# Patient Record
Sex: Female | Born: 1950 | Race: White | Hispanic: No | Marital: Married | Smoking: Never smoker
Health system: Southern US, Community
[De-identification: ages and names within clinical notes are randomized; demographics above are authoritative.]

## PROBLEM LIST (undated history)

## (undated) DIAGNOSIS — Z973 Presence of spectacles and contact lenses: Secondary | ICD-10-CM

## (undated) DIAGNOSIS — E785 Hyperlipidemia, unspecified: Secondary | ICD-10-CM

## (undated) DIAGNOSIS — M1990 Unspecified osteoarthritis, unspecified site: Secondary | ICD-10-CM

## (undated) DIAGNOSIS — K5730 Diverticulosis of large intestine without perforation or abscess without bleeding: Secondary | ICD-10-CM

## (undated) DIAGNOSIS — Z8249 Family history of ischemic heart disease and other diseases of the circulatory system: Secondary | ICD-10-CM

## (undated) DIAGNOSIS — B009 Herpesviral infection, unspecified: Secondary | ICD-10-CM

## (undated) DIAGNOSIS — N201 Calculus of ureter: Secondary | ICD-10-CM

## (undated) DIAGNOSIS — N200 Calculus of kidney: Secondary | ICD-10-CM

## (undated) DIAGNOSIS — M8580 Other specified disorders of bone density and structure, unspecified site: Secondary | ICD-10-CM

## (undated) HISTORY — DX: Other specified disorders of bone density and structure, unspecified site: M85.80

## (undated) HISTORY — DX: Family history of ischemic heart disease and other diseases of the circulatory system: Z82.49

## (undated) HISTORY — DX: Herpesviral infection, unspecified: B00.9

## (undated) HISTORY — DX: Hyperlipidemia, unspecified: E78.5

## (undated) HISTORY — DX: Calculus of kidney: N20.0

## (undated) HISTORY — DX: Unspecified osteoarthritis, unspecified site: M19.90

---

## 1987-10-17 HISTORY — PX: OTHER SURGICAL HISTORY: SHX169

## 1993-10-16 HISTORY — PX: DILATION AND EVACUATION: SHX1459

## 1999-01-17 ENCOUNTER — Other Ambulatory Visit: Admission: RE | Admit: 1999-01-17 | Discharge: 1999-01-17 | Payer: Self-pay | Admitting: *Deleted

## 1999-07-17 HISTORY — PX: LAPAROSCOPIC OVARIAN CYSTECTOMY: SUR786

## 1999-08-05 ENCOUNTER — Encounter (INDEPENDENT_AMBULATORY_CARE_PROVIDER_SITE_OTHER): Payer: Self-pay | Admitting: Specialist

## 1999-08-05 ENCOUNTER — Other Ambulatory Visit: Admission: RE | Admit: 1999-08-05 | Discharge: 1999-08-05 | Payer: Self-pay | Admitting: *Deleted

## 2000-02-28 ENCOUNTER — Other Ambulatory Visit: Admission: RE | Admit: 2000-02-28 | Discharge: 2000-02-28 | Payer: Self-pay | Admitting: *Deleted

## 2001-04-23 ENCOUNTER — Other Ambulatory Visit: Admission: RE | Admit: 2001-04-23 | Discharge: 2001-04-23 | Payer: Self-pay | Admitting: *Deleted

## 2002-04-23 ENCOUNTER — Other Ambulatory Visit: Admission: RE | Admit: 2002-04-23 | Discharge: 2002-04-23 | Payer: Self-pay | Admitting: *Deleted

## 2003-05-04 ENCOUNTER — Other Ambulatory Visit: Admission: RE | Admit: 2003-05-04 | Discharge: 2003-05-04 | Payer: Self-pay | Admitting: *Deleted

## 2004-05-05 ENCOUNTER — Other Ambulatory Visit: Admission: RE | Admit: 2004-05-05 | Discharge: 2004-05-05 | Payer: Self-pay | Admitting: *Deleted

## 2005-05-10 ENCOUNTER — Other Ambulatory Visit: Admission: RE | Admit: 2005-05-10 | Discharge: 2005-05-10 | Payer: Self-pay | Admitting: *Deleted

## 2006-05-14 ENCOUNTER — Other Ambulatory Visit: Admission: RE | Admit: 2006-05-14 | Discharge: 2006-05-14 | Payer: Self-pay | Admitting: *Deleted

## 2007-01-27 ENCOUNTER — Emergency Department (HOSPITAL_COMMUNITY): Admission: EM | Admit: 2007-01-27 | Discharge: 2007-01-27 | Payer: Self-pay | Admitting: Emergency Medicine

## 2007-03-05 ENCOUNTER — Encounter: Admission: RE | Admit: 2007-03-05 | Discharge: 2007-03-05 | Payer: Self-pay | Admitting: Orthopedic Surgery

## 2007-10-29 ENCOUNTER — Other Ambulatory Visit: Admission: RE | Admit: 2007-10-29 | Discharge: 2007-10-29 | Payer: Self-pay | Admitting: *Deleted

## 2008-10-16 HISTORY — PX: COLONOSCOPY: SHX174

## 2009-04-05 ENCOUNTER — Encounter: Admission: RE | Admit: 2009-04-05 | Discharge: 2009-04-05 | Payer: Self-pay | Admitting: Family Medicine

## 2009-12-15 HISTORY — PX: NM MYOCAR PERF WALL MOTION: HXRAD629

## 2009-12-15 HISTORY — PX: US ECHOCARDIOGRAPHY: HXRAD669

## 2011-09-16 HISTORY — PX: BREAST BIOPSY: SHX20

## 2011-10-05 ENCOUNTER — Other Ambulatory Visit: Payer: Self-pay | Admitting: Radiology

## 2013-01-13 ENCOUNTER — Other Ambulatory Visit: Payer: Self-pay | Admitting: Orthopedic Surgery

## 2013-01-13 DIAGNOSIS — M25551 Pain in right hip: Secondary | ICD-10-CM

## 2013-01-15 ENCOUNTER — Ambulatory Visit
Admission: RE | Admit: 2013-01-15 | Discharge: 2013-01-15 | Disposition: A | Discharge status: Home / Self Care | Payer: BC Managed Care – PPO | Source: Ambulatory Visit | Attending: Orthopedic Surgery | Admitting: Orthopedic Surgery

## 2013-01-15 DIAGNOSIS — M25551 Pain in right hip: Secondary | ICD-10-CM

## 2013-05-16 ENCOUNTER — Telehealth: Payer: Self-pay | Admitting: Obstetrics & Gynecology

## 2013-05-16 MED ORDER — VALACYCLOVIR HCL 1 G PO TABS: 1000.0000 mg | ORAL_TABLET | Freq: Every day | ORAL | Status: AC

## 2013-05-16 NOTE — Telephone Encounter (Signed)
Patient needs her Valtrex refilled.   [facility]  [place]  [phone]

## 2013-05-16 NOTE — Telephone Encounter (Signed)
Refill request for VALTREX Last filled by MD on - 06/28/12, #30 X 3 Last AEX - 12/19/12 Next AEX - 02/2014 RX sent as last prescribed.

## 2013-07-16 ENCOUNTER — Telehealth: Payer: Self-pay | Admitting: Cardiovascular Disease

## 2013-07-16 NOTE — Telephone Encounter (Signed)
Please mail out a lab order before her appt on 08/04/2013..Thanks

## 2013-07-17 ENCOUNTER — Other Ambulatory Visit: Payer: Self-pay | Admitting: *Deleted

## 2013-07-17 DIAGNOSIS — Z79899 Other long term (current) drug therapy: Secondary | ICD-10-CM

## 2013-07-17 DIAGNOSIS — E782 Mixed hyperlipidemia: Secondary | ICD-10-CM

## 2013-07-17 NOTE — Telephone Encounter (Signed)
Lab requisitions printed and mailed to patient

## 2013-07-28 LAB — LIPID PANEL
HDL: 80 mg/dL (ref >39)
LDL Cholesterol: 108 mg/dL — ABNORMAL HIGH (ref 0–99)
Triglycerides: 96 mg/dL (ref <150)
VLDL: 19 mg/dL (ref 0–40)

## 2013-07-28 LAB — HEPATIC FUNCTION PANEL
ALT: 15 U/L (ref 0–35)
Bilirubin, Direct: 0.2 mg/dL (ref 0.0–0.3)
Indirect Bilirubin: 0.8 mg/dL (ref 0.0–0.9)

## 2013-08-04 ENCOUNTER — Encounter: Payer: Self-pay | Admitting: Cardiovascular Disease

## 2013-08-04 ENCOUNTER — Ambulatory Visit (INDEPENDENT_AMBULATORY_CARE_PROVIDER_SITE_OTHER): Payer: BC Managed Care – PPO | Admitting: Cardiovascular Disease

## 2013-08-04 VITALS — BP 128/84 | HR 71 | Ht 61.5 in | Wt 150.0 lb

## 2013-08-04 DIAGNOSIS — Z8249 Family history of ischemic heart disease and other diseases of the circulatory system: Secondary | ICD-10-CM

## 2013-08-04 DIAGNOSIS — E785 Hyperlipidemia, unspecified: Secondary | ICD-10-CM | POA: Insufficient documentation

## 2013-08-04 NOTE — Assessment & Plan Note (Signed)
Diet controlled with recent lipid profile performed 07/28/13 revealing a total cholesterol 207, LDL of 108 and HDL of 80.

## 2013-08-04 NOTE — Progress Notes (Signed)
     08/04/2013 Brooke Whitaker   1951/06/04  [id number]  Primary Physician Astrid Divine, MD Primary Cardiologist: Runell Gess MD Roseanne Reno   HPI:  The patient returns today for followup. She is a 62 year old, mildly overweight, married Caucasian female, mother of 2, patient of Dr. Maurice Small who I saw Feb 14, 2011. Her risk factors include dyslipidemia and a strong family history of heart disease. She had a father who had his first MI at age 21 and died of an MI at age 76. She had a sister who died suddenly of an MI at age 68. She is totally asymptomatic. She had an echo and a Myoview performed March 2011 which were entirely normal. Recent lipid profile performed several weeks ago revealed a total cholesterol of 207, LDL of 108 and HDL of 80. She denies chest pain or shortness of breath..     Current Outpatient Prescriptions  Medication Sig Dispense Refill  . CELEBREX 200 MG capsule Take 200 mg by mouth as needed.      . cholecalciferol (VITAMIN D) 1000 UNITS tablet Take 1,000 Units by mouth daily.      . fluticasone (FLONASE) 50 MCG/ACT nasal spray as needed.      . Multiple Vitamin (MULTIVITAMIN) capsule Take 1 capsule by mouth daily.      . multivitamin-lutein (OCUVITE-LUTEIN) CAPS capsule Take 1 capsule by mouth daily.       No current facility-administered medications for this visit.    Allergies  Allergen Reactions  . Sulfa Antibiotics     History   Social History  . Marital Status: Married    Spouse Name: N/A    Number of Children: N/A  . Years of Education: N/A   Occupational History  . Not on file.   Social History Main Topics  . Smoking status: Never Smoker   . Smokeless tobacco: Not on file  . Alcohol Use: Yes  . Drug Use: Not on file  . Sexual Activity: Not on file   Other Topics Concern  . Not on file   Social History Narrative  . No narrative on file     Review of Systems: General: negative for chills, fever, night  sweats or weight changes.  Cardiovascular: negative for chest pain, dyspnea on exertion, edema, orthopnea, palpitations, paroxysmal nocturnal dyspnea or shortness of breath Dermatological: negative for rash Respiratory: negative for cough or wheezing Urologic: negative for hematuria Abdominal: negative for nausea, vomiting, diarrhea, bright red blood per rectum, melena, or hematemesis Neurologic: negative for visual changes, syncope, or dizziness All other systems reviewed and are otherwise negative except as noted above.    Blood pressure 128/84, pulse 71, height 5' 1.5" (1.562 m), weight 150 lb (68.04 kg).  General appearance: alert and no distress Neck: no adenopathy, no carotid bruit, no JVD, supple, symmetrical, trachea midline and thyroid not enlarged, symmetric, no tenderness/mass/nodules Lungs: clear to auscultation bilaterally Heart: regular rate and rhythm, S1, S2 normal, no murmur, click, rub or gallop Extremities: extremities normal, atraumatic, no cyanosis or edema  EKG sinus rhythm at 71 without ST or T wave changes.  ASSESSMENT AND PLAN:   Hyperlipidemia Diet controlled with recent lipid profile performed 07/28/13 revealing a total cholesterol 207, LDL of 108 and HDL of 80.      Runell Gess MD FACP,FACC,FAHA, [facility] 08/04/2013 11:34 AM

## 2013-08-04 NOTE — Patient Instructions (Signed)
Your physician wants you to follow-up in: 1 year with Dr Berry. You will receive a reminder letter in the mail two months in advance. If you don't receive a letter, please call our office to schedule the follow-up appointment.  

## 2013-12-04 ENCOUNTER — Encounter: Payer: Self-pay | Admitting: Nurse Practitioner

## 2013-12-04 ENCOUNTER — Ambulatory Visit (INDEPENDENT_AMBULATORY_CARE_PROVIDER_SITE_OTHER): Payer: BC Managed Care – PPO | Admitting: Nurse Practitioner

## 2013-12-04 VITALS — BP 134/72 | HR 84 | Ht 61.5 in | Wt 150.0 lb

## 2013-12-04 DIAGNOSIS — B373 Candidiasis of vulva and vagina: Secondary | ICD-10-CM

## 2013-12-04 DIAGNOSIS — N952 Postmenopausal atrophic vaginitis: Secondary | ICD-10-CM

## 2013-12-04 DIAGNOSIS — B3731 Acute candidiasis of vulva and vagina: Secondary | ICD-10-CM

## 2013-12-04 MED ORDER — TERCONAZOLE 0.4 % VA CREA
1.0000 | TOPICAL_CREAM | Freq: Every day | VAGINAL | Status: DC
Start: 2013-12-04 — End: 2014-01-13

## 2013-12-04 NOTE — Patient Instructions (Signed)
Monilial Vaginitis  Vaginitis in a soreness, swelling and redness (inflammation) of the vagina and vulva. Monilial vaginitis is not a sexually transmitted infection.  CAUSES   Yeast vaginitis is caused by yeast (candida) that is normally found in your vagina. With a yeast infection, the candida has overgrown in number to a point that upsets the chemical balance.  SYMPTOMS   · White, thick vaginal discharge.  · Swelling, itching, redness and irritation of the vagina and possibly the lips of the vagina (vulva).  · Burning or painful urination.  · Painful intercourse.  DIAGNOSIS   Things that may contribute to monilial vaginitis are:  · Postmenopausal and virginal states.  · Pregnancy.  · Infections.  · Being tired, sick or stressed, especially if you had monilial vaginitis in the past.  · Diabetes. Good control will help lower the chance.  · Birth control pills.  · Tight fitting garments.  · Using bubble bath, feminine sprays, douches or deodorant tampons.  · Taking certain medications that kill germs (antibiotics).  · Sporadic recurrence can occur if you become ill.  TREATMENT   Your caregiver will give you medication.  · There are several kinds of anti monilial vaginal creams and suppositories specific for monilial vaginitis. For recurrent yeast infections, use a suppository or cream in the vagina 2 times a week, or as directed.  · Anti-monilial or steroid cream for the itching or irritation of the vulva may also be used. Get your caregiver's permission.  · Painting the vagina with methylene blue solution may help if the monilial cream does not work.  · Eating yogurt may help prevent monilial vaginitis.  HOME CARE INSTRUCTIONS   · Finish all medication as prescribed.  · Do not have sex until treatment is completed or after your caregiver tells you it is okay.  · Take warm sitz baths.  · Do not douche.  · Do not use tampons, especially scented ones.  · Wear cotton underwear.  · Avoid tight pants and panty  hose.  · Tell your sexual partner that you have a yeast infection. They should go to their caregiver if they have symptoms such as mild rash or itching.  · Your sexual partner should be treated as well if your infection is difficult to eliminate.  · Practice safer sex. Use condoms.  · Some vaginal medications cause latex condoms to fail. Vaginal medications that harm condoms are:  · Cleocin cream.  · Butoconazole (Femstat®).  · Terconazole (Terazol®) vaginal suppository.  · Miconazole (Monistat®) (may be purchased over the counter).  SEEK MEDICAL CARE IF:   · You have a temperature by mouth above 102° F (38.9° C).  · The infection is getting worse after 2 days of treatment.  · The infection is not getting better after 3 days of treatment.  · You develop blisters in or around your vagina.  · You develop vaginal bleeding, and it is not your menstrual period.  · You have pain when you urinate.  · You develop intestinal problems.  · You have pain with sexual intercourse.  Document Released: 07/12/2005 Document Revised: 12/25/2011 Document Reviewed: 03/26/2009  ExitCare® Patient Information ©2014 [employer].

## 2013-12-04 NOTE — Progress Notes (Signed)
Subjective:     Patient ID: Brooke Whitaker, female   DOB: 10/25/1950, 63 y.o.   MRN: [id number]  HPI   This 63 yo female who is postmenopausal and has a history of atrophic vaginitis.  She has been off vaginal estrogen for some time then recently restarted.  On Sauturday they tried to have SA and found that it was very uncomfortable for her and after some time stopped.  Within a few days she felt irritated and burning feeling with itching.  Not a lot of vaginal dischsrge. She denies any urinary symptoms. No fever or chills.   Review of Systems  Constitutional: Negative for fever, chills and fatigue.  Respiratory: Negative.   Cardiovascular: Negative.   Gastrointestinal: Negative.   Genitourinary: Positive for vaginal pain and dyspareunia. Negative for dysuria, urgency, frequency, flank pain, vaginal bleeding, vaginal discharge, genital sores and pelvic pain.  Musculoskeletal: Negative.   Skin: Negative.   Neurological: Negative.   Psychiatric/Behavioral: Negative.        Objective:   Physical Exam  Constitutional: She appears well-developed and well-nourished. No distress.  Abdominal: Soft. She exhibits no distension. There is no tenderness. There is no rebound.  Genitourinary:  External vulvar areas are irritated and tender.  One area looks like LSA and that was discussed by Dr. Sabra Heck in the past.  Only slight thin vaginal discharge.  With insertion of the speculum this was uncomfortable for her. Wet Prep: PH: 5.5; NSS: negative; KOH: moderate yeast.       Assessment:     Yeast vaginitis Atrophic vaginitis    Plan:     Will treat with Terazol vaginal cream hs x 7 She is due her AEX and will get that scheduled and recheck at that time.    Old records reviewed: LSA 11/07/07 biopsy

## 2013-12-07 NOTE — Progress Notes (Signed)
Encounter reviewed by Dr. Kaity Pitstick Silva.  

## 2014-01-13 ENCOUNTER — Encounter: Payer: Self-pay | Admitting: Obstetrics & Gynecology

## 2014-01-13 ENCOUNTER — Ambulatory Visit (INDEPENDENT_AMBULATORY_CARE_PROVIDER_SITE_OTHER): Payer: BC Managed Care – PPO | Admitting: Obstetrics & Gynecology

## 2014-01-13 VITALS — BP 128/62 | HR 64 | Resp 16 | Ht 61.75 in | Wt 149.6 lb

## 2014-01-13 DIAGNOSIS — Z124 Encounter for screening for malignant neoplasm of cervix: Secondary | ICD-10-CM

## 2014-01-13 DIAGNOSIS — Z0000 Encounter for general adult medical examination without abnormal findings: Secondary | ICD-10-CM

## 2014-01-13 DIAGNOSIS — Z01419 Encounter for gynecological examination (general) (routine) without abnormal findings: Secondary | ICD-10-CM

## 2014-01-13 DIAGNOSIS — Z1211 Encounter for screening for malignant neoplasm of colon: Secondary | ICD-10-CM

## 2014-01-13 LAB — LIPID PANEL
Cholesterol: 192 mg/dL (ref 0–200)
HDL: 87 mg/dL (ref >39)
LDL CALC: 89 mg/dL (ref 0–99)
Total CHOL/HDL Ratio: 2.2 Ratio
Triglycerides: 79 mg/dL (ref <150)
VLDL: 16 mg/dL (ref 0–40)

## 2014-01-13 LAB — POCT URINALYSIS DIPSTICK
BILIRUBIN UA: NEGATIVE
GLUCOSE UA: NEGATIVE
KETONES UA: NEGATIVE
Nitrite, UA: NEGATIVE
Urobilinogen, UA: NEGATIVE
pH, UA: 5

## 2014-01-13 LAB — COMPREHENSIVE METABOLIC PANEL
ALBUMIN: 4.4 g/dL (ref 3.5–5.2)
ALT: 17 U/L (ref 0–35)
AST: 19 U/L (ref 0–37)
Alkaline Phosphatase: 91 U/L (ref 39–117)
BUN: 16 mg/dL (ref 6–23)
CALCIUM: 8.8 mg/dL (ref 8.4–10.5)
CHLORIDE: 105 meq/L (ref 96–112)
CO2: 31 meq/L (ref 19–32)
CREATININE: 0.67 mg/dL (ref 0.50–1.10)
Glucose, Bld: 67 mg/dL — ABNORMAL LOW (ref 70–99)
POTASSIUM: 3.6 meq/L (ref 3.5–5.3)
SODIUM: 144 meq/L (ref 135–145)
TOTAL PROTEIN: 6.1 g/dL (ref 6.0–8.3)
Total Bilirubin: 1 mg/dL (ref 0.2–1.2)

## 2014-01-13 LAB — HEMOGLOBIN, FINGERSTICK: HEMOGLOBIN, FINGERSTICK: 12.3 g/dL (ref 12.0–16.0)

## 2014-01-13 LAB — VITAMIN D 25 HYDROXY (VIT D DEFICIENCY, FRACTURES): VIT D 25 HYDROXY: 37 ng/mL (ref 30–89)

## 2014-01-13 LAB — TSH: TSH: 3.197 u[IU]/mL (ref 0.350–4.500)

## 2014-01-13 MED ORDER — ESTRADIOL 0.1 MG/GM VA CREA: 1.0000 | TOPICAL_CREAM | VAGINAL | Status: DC

## 2014-01-13 NOTE — Progress Notes (Signed)
63 y.o. G3P2 MarriedCaucasianF here for annual exam.  Having some right hip issues--pain and difficulty walking.  Had an MRI with Dr. French Ana.  Had a small tear due to a bone spur.  Was on crutches.  Working out with a Physiological scientist.  May have to have surgery but for now is trying to see if can get by without it.    Son getting married in June.  Daughter graduating from [employer] and going to [place].  So proud!  Patient's last menstrual period was 10/16/2001.          Sexually active: yes  The current method of family planning is post menopausal status.    Exercising: yes   Smoker:  no  Health Maintenance: Pap:  09/18/11 WNL/negative HR HPV History of abnormal Pap:  yes MMG:  01/02/13-mmg, 01/08/13 additional views left diag-normal Colonoscopy:  2009-repeat in 5 years, patient aware due, but would like to wait BMD:   2009 TDaP:  2010 Screening Labs: todau, Hb today: 12.3, Urine today: WBC-trace, RBC-trace, PROTEIN-trace   reports that she has never smoked. She has never used smokeless tobacco. She reports that she drinks alcohol. She reports that she does not use illicit drugs.  Past Medical History  Diagnosis Date  . Hyperlipidemia   . Family history of early CAD   . Osteopenia   . HSV-2 infection   . Anemia   . 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 Laparoscopic ovarian cystectomy    . Episiotomy repair    . Dilation and evacuation    . Breast biopsy      benign     Current Outpatient Prescriptions  Medication Sig Dispense Refill  . aspirin EC 81 MG tablet Take 81 mg by mouth daily.      . CELEBREX 200 MG capsule Take 200 mg by mouth as needed.      . cholecalciferol (VITAMIN D) 1000 UNITS tablet Take 1,000 Units by mouth daily.      . Coenzyme Q10 (CO Q 10 PO) Take 1 tablet by mouth daily.      [name] estradiol (ESTRACE) 0.1 MG/GM vaginal cream Place 1 Applicatorful vaginally 2 (two)  times a week.      . fluticasone (FLONASE) 50 MCG/ACT nasal spray as needed.      . Lutein 10 MG TABS Take 1 tablet by mouth daily.      . Melatonin 10 MG CAPS Take by mouth daily.      [name] MILK THISTLE PO Take by mouth.      . Multiple Vitamin (MULTIVITAMIN) capsule Take 1 capsule by mouth daily.      . ciclopirox (LOPROX) 0.77 % cream        No current facility-administered medications for this visit.    Family History  Problem Relation Age of Onset  . Diabetes Mother   . Diabetes Brother   . Diabetes Sister   . Colon cancer Brother     mets to liver  . Hypertension Sister     x2  . Heart attack Father   . Congestive Heart Failure Mother   . Heart disease Brother     pacemaker  . Stroke Mother   . Stroke Brother   . Thyroid disease Sister   . Osteoporosis Mother   . Other Mother     gallbladder ruptured    ROS:  Pertinent items are noted  in HPI.  Otherwise, a comprehensive ROS was negative.  Exam:   BP 128/62  Pulse 64  Resp 16  Ht 5' 1.75" (1.568 m)  Wt 149 lb 9.6 oz (67.858 kg)  BMI 27.60 kg/m2  LMP 10/16/2001  Weight change: -7#   Height: 5' 1.75" (156.8 cm)  Ht Readings from Last 3 Encounters:  01/13/14 5' 1.75" (1.568 m)  12/04/13 5' 1.5" (1.562 m)  08/04/13 5' 1.5" (1.562 m)    General appearance: alert, cooperative and appears stated age Head: Normocephalic, without obvious abnormality, atraumatic Neck: no adenopathy, supple, symmetrical, trachea midline and thyroid normal to inspection and palpation Lungs: clear to auscultation bilaterally Breasts: normal appearance, no masses or tenderness Heart: regular rate and rhythm Abdomen: soft, non-tender; bowel sounds normal; no masses,  no organomegaly Extremities: extremities normal, atraumatic, no cyanosis or edema Skin: Skin color, texture, turgor normal. No rashes or lesions Lymph nodes: Cervical, supraclavicular, and axillary nodes normal. No abnormal inguinal nodes palpated Neurologic: Grossly  normal   Pelvic: External genitalia:  no lesions              Urethra:  normal appearing urethra with no masses, tenderness or lesions              Bartholins and Skenes: normal                 Vagina: atrophic              Cervix: no lesions              Pap taken: yes Bimanual Exam:  Uterus:  normal size, contour, position, consistency, mobility, non-tender              Adnexa: normal adnexa and no mass, fullness, tenderness               Rectovaginal: Confirms               Anus:  normal sphincter tone, no lesions  A:  Well Woman with normal exam PMP, no HRT Osteopenia LS&A  P:   Mammogram yearly Declines colonoscopy.  Ifob given. Order for MMG and BMD given pap smear only obtained. Rx for Estrace vaginal cream 1 gram pv twice weekly.  #42.5gram/2RF TSH, CMP, Lipids, Vit D return annually or prn  An After Visit Summary was printed and given to the patient.

## 2014-01-14 LAB — IPS PAP SMEAR ONLY

## 2014-03-12 ENCOUNTER — Ambulatory Visit: Payer: Self-pay | Admitting: Obstetrics & Gynecology

## 2014-04-03 ENCOUNTER — Encounter (HOSPITAL_COMMUNITY): Payer: Self-pay | Admitting: Emergency Medicine

## 2014-04-03 ENCOUNTER — Emergency Department (HOSPITAL_COMMUNITY)
Admission: EM | Admit: 2014-04-03 | Discharge: 2014-04-04 | Disposition: A | Discharge status: Home / Self Care | Payer: BC Managed Care – PPO | Attending: Emergency Medicine | Admitting: Emergency Medicine

## 2014-04-03 ENCOUNTER — Emergency Department (HOSPITAL_COMMUNITY): Payer: BC Managed Care – PPO

## 2014-04-03 DIAGNOSIS — Z8619 Personal history of other infectious and parasitic diseases: Secondary | ICD-10-CM | POA: Insufficient documentation

## 2014-04-03 DIAGNOSIS — Z862 Personal history of diseases of the blood and blood-forming organs and certain disorders involving the immune mechanism: Secondary | ICD-10-CM | POA: Insufficient documentation

## 2014-04-03 DIAGNOSIS — Z880 Allergy status to penicillin: Secondary | ICD-10-CM | POA: Insufficient documentation

## 2014-04-03 DIAGNOSIS — M129 Arthropathy, unspecified: Secondary | ICD-10-CM | POA: Insufficient documentation

## 2014-04-03 DIAGNOSIS — Z7982 Long term (current) use of aspirin: Secondary | ICD-10-CM | POA: Insufficient documentation

## 2014-04-03 DIAGNOSIS — N200 Calculus of kidney: Secondary | ICD-10-CM

## 2014-04-03 DIAGNOSIS — N201 Calculus of ureter: Secondary | ICD-10-CM | POA: Insufficient documentation

## 2014-04-03 DIAGNOSIS — M899 Disorder of bone, unspecified: Secondary | ICD-10-CM | POA: Insufficient documentation

## 2014-04-03 DIAGNOSIS — Z79899 Other long term (current) drug therapy: Secondary | ICD-10-CM | POA: Insufficient documentation

## 2014-04-03 DIAGNOSIS — M949 Disorder of cartilage, unspecified: Secondary | ICD-10-CM

## 2014-04-03 LAB — URINE MICROSCOPIC-ADD ON

## 2014-04-03 LAB — CBC WITH DIFFERENTIAL/PLATELET
BASOS PCT: 0 % (ref 0–1)
Basophils Absolute: 0 10*3/uL (ref 0.0–0.1)
EOS ABS: 0.1 10*3/uL (ref 0.0–0.7)
Eosinophils Relative: 1 % (ref 0–5)
HCT: 34.7 % — ABNORMAL LOW (ref 36.0–46.0)
HEMOGLOBIN: 11.5 g/dL — AB (ref 12.0–15.0)
LYMPHS ABS: 3 10*3/uL (ref 0.7–4.0)
Lymphocytes Relative: 34 % (ref 12–46)
MCH: 33.2 pg (ref 26.0–34.0)
MCHC: 33.1 g/dL (ref 30.0–36.0)
MCV: 100.3 fL — ABNORMAL HIGH (ref 78.0–100.0)
Monocytes Absolute: 0.5 10*3/uL (ref 0.1–1.0)
Monocytes Relative: 5 % (ref 3–12)
NEUTROS ABS: 5.2 10*3/uL (ref 1.7–7.7)
NEUTROS PCT: 60 % (ref 43–77)
PLATELETS: 169 10*3/uL (ref 150–400)
RBC: 3.46 MIL/uL — AB (ref 3.87–5.11)
RDW: 13.9 % (ref 11.5–15.5)
WBC: 8.8 10*3/uL (ref 4.0–10.5)

## 2014-04-03 LAB — COMPREHENSIVE METABOLIC PANEL
ALBUMIN: 4.3 g/dL (ref 3.5–5.2)
ALK PHOS: 105 U/L (ref 39–117)
ALT: 14 U/L (ref 0–35)
AST: 17 U/L (ref 0–37)
BILIRUBIN TOTAL: 0.7 mg/dL (ref 0.3–1.2)
BUN: 29 mg/dL — ABNORMAL HIGH (ref 6–23)
CHLORIDE: 102 meq/L (ref 96–112)
CO2: 22 mEq/L (ref 19–32)
Calcium: 9.2 mg/dL (ref 8.4–10.5)
Creatinine, Ser: 0.85 mg/dL (ref 0.50–1.10)
GFR calc Af Amer: 84 mL/min — ABNORMAL LOW (ref >90)
GFR calc non Af Amer: 72 mL/min — ABNORMAL LOW (ref >90)
Glucose, Bld: 119 mg/dL — ABNORMAL HIGH (ref 70–99)
POTASSIUM: 3.8 meq/L (ref 3.7–5.3)
SODIUM: 142 meq/L (ref 137–147)
TOTAL PROTEIN: 6.5 g/dL (ref 6.0–8.3)

## 2014-04-03 LAB — URINALYSIS, ROUTINE W REFLEX MICROSCOPIC
BILIRUBIN URINE: NEGATIVE
GLUCOSE, UA: NEGATIVE mg/dL
Ketones, ur: 40 mg/dL — AB
Nitrite: NEGATIVE
PH: 6 (ref 5.0–8.0)
Protein, ur: 100 mg/dL — AB
SPECIFIC GRAVITY, URINE: 1.022 (ref 1.005–1.030)
Urobilinogen, UA: 0.2 mg/dL (ref 0.0–1.0)

## 2014-04-03 LAB — LIPASE, BLOOD: Lipase: 23 U/L (ref 11–59)

## 2014-04-03 MED ORDER — SODIUM CHLORIDE 0.9 % IV BOLUS (SEPSIS)
1000.0000 mL | Freq: Once | INTRAVENOUS | Status: AC
  Administered 2014-04-03: 1000 mL via INTRAVENOUS

## 2014-04-03 MED ORDER — MORPHINE SULFATE 4 MG/ML IJ SOLN
6.0000 mg | Freq: Once | INTRAMUSCULAR | Status: AC
  Administered 2014-04-03: 4 mg via INTRAVENOUS
  Filled 2014-04-03: qty 2

## 2014-04-03 NOTE — ED Notes (Signed)
Pt. Only wants partial dose of morphine.

## 2014-04-03 NOTE — ED Provider Notes (Signed)
CSN: [id number]     Arrival date & time 04/03/14  1936 History   First MD Initiated Contact with Patient 04/03/14 2027     Chief Complaint  Patient presents with  . Abdominal Pain     (Consider location/radiation/quality/duration/timing/severity/associated sxs/prior Treatment) Patient is a 63 y.o. female presenting with abdominal pain.  Abdominal Pain Pain location:  LLQ and suprapubic Pain quality: aching, pressure and sharp   Pain radiates to:  Does not radiate Pain severity:  Moderate Onset quality:  Gradual Duration:  2 weeks Timing:  Constant Progression:  Worsening Chronicity:  New Context: not alcohol use, not eating and not recent illness   Relieved by:  None tried Worsened by:  Nothing tried Ineffective treatments:  None tried Associated symptoms: diarrhea, nausea and vomiting   Associated symptoms: no anorexia, no chest pain, no cough, no dysuria, no fatigue, no fever and no shortness of breath     Past Medical History  Diagnosis Date  . Hyperlipidemia   . Family history of early CAD   . Osteopenia   . HSV-2 infection   . Anemia   . 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 Laparoscopic ovarian cystectomy  10/00  . Episiotomy repair    . Dilation and evacuation  1995  . Breast biopsy  12/12    benign    Family History  Problem Relation Age of Onset  . Diabetes Mother   . Diabetes Brother   . Diabetes Sister   . Colon cancer Brother     mets to liver  . Hypertension Sister     x2  . Heart attack Father   . Congestive Heart Failure Mother   . Heart disease Brother     pacemaker  . Stroke Mother   . Stroke Brother   . Thyroid disease Sister   . Osteoporosis Mother   . Other Mother     gallbladder ruptured   History  Substance Use Topics  . Smoking status: Never Smoker   . Smokeless tobacco: Never Used  . Alcohol Use: Yes     Comment: red or white wine-most  days   OB History   Grav Para Term Preterm Abortions TAB SAB Ect Mult Living   3 2        2      Review of Systems  Constitutional: Negative for fever, activity change and fatigue.  HENT: Negative for congestion and facial swelling.   Eyes: Negative for discharge and redness.  Respiratory: Negative for cough and shortness of breath.   Cardiovascular: Negative for chest pain and palpitations.  Gastrointestinal: Positive for nausea, vomiting, abdominal pain and diarrhea. Negative for abdominal distention and anorexia.  Endocrine: Negative for polydipsia and polyuria.  Genitourinary: Negative for dysuria and menstrual problem.  Musculoskeletal: Negative for back pain and joint swelling.  Skin: Negative for color change and wound.  Neurological: Negative for dizziness, light-headedness and headaches.      Allergies  Penicillins and Sulfa antibiotics  Home Medications   Prior to Admission medications   Medication Sig Start Date End Date Taking? Authorizing Provider  acetaminophen (TYLENOL) 500 MG tablet Take 500 mg by mouth every 6 (six) hours as needed.   Yes Historical Provider, MD  aspirin EC 81 MG tablet Take 81 mg by mouth daily.   Yes Historical Provider, MD  CELEBREX 200 MG capsule Take 200 mg by mouth daily as needed for  mild pain.  05/13/13  Yes Historical Provider, MD  cholecalciferol (VITAMIN D) 1000 UNITS tablet Take 1,000 Units by mouth daily.   Yes Historical Provider, MD  ciclopirox (LOPROX) 0.77 % cream  11/24/13  Yes Historical Provider, MD  estradiol (ESTRACE) 0.1 MG/GM vaginal cream Place 1 Applicatorful vaginally 2 (two) times a week. 01/13/14  Yes Lyman Speller, MD  Melatonin 10 MG CAPS Take by mouth daily.   Yes Historical Provider, MD  MILK THISTLE PO Take by mouth.   Yes Historical Provider, MD   BP 132/63  Pulse 77  Temp(Src) 98.1 F (36.7 C) (Oral)  Resp 20  Ht 5\' 1"  (1.549 m)  Wt 147 lb (66.679 kg)  BMI 27.79 kg/m2  SpO2 99%  LMP  10/16/2001 Physical Exam  Nursing note and vitals reviewed. Constitutional: She is oriented to person, place, and time. She appears well-developed and well-nourished.  HENT:  Head: Normocephalic and atraumatic.  Eyes: Conjunctivae and EOM are normal. Right eye exhibits no discharge. Left eye exhibits no discharge.  Cardiovascular: Normal rate and regular rhythm.   Pulmonary/Chest: Effort normal and breath sounds normal. No respiratory distress.  Abdominal: Soft. She exhibits no distension. There is tenderness (suprapubic and llq). There is no rebound.  Musculoskeletal: Normal range of motion. She exhibits no edema and no tenderness.  Neurological: She is alert and oriented to person, place, and time.  Skin: Skin is warm and dry.    ED Course  Procedures (including critical care time) Labs Review Labs Reviewed  CBC WITH DIFFERENTIAL - Abnormal; Notable for the following:    RBC 3.46 (*)    Hemoglobin 11.5 (*)    HCT 34.7 (*)    MCV 100.3 (*)    All other components within normal limits  COMPREHENSIVE METABOLIC PANEL - Abnormal; Notable for the following:    Glucose, Bld 119 (*)    BUN 29 (*)    GFR calc non Af Amer 72 (*)    GFR calc Af Amer 84 (*)    All other components within normal limits  URINALYSIS, ROUTINE W REFLEX MICROSCOPIC - Abnormal; Notable for the following:    Color, Urine BROWN (*)    APPearance CLOUDY (*)    Hgb urine dipstick LARGE (*)    Ketones, ur 40 (*)    Protein, ur 100 (*)    Leukocytes, UA SMALL (*)    All other components within normal limits  URINE MICROSCOPIC-ADD ON - Abnormal; Notable for the following:    Squamous Epithelial / LPF FEW (*)    Bacteria, UA FEW (*)    All other components within normal limits  LIPASE, BLOOD    Imaging Review No results found.   EKG Interpretation None      MDM   Final diagnoses:  None    Pertinent positives and negatives from above HPI, ROS and PE include: 63 yo F w/o significant PMH here with llq  abdominal pain associated with n/v and some diarrhea. Been going on for a couple weeks but significantly worse in last 2 days and specifically the last couple hours prior to arrival. No fevers. Some suprapubic fullness. No h/o UTI, kidney stone, diverticulosis. Exam with suprapubic ttp, minimal. Labs unremarkable aside from large amount of blood and contaminant in urine. Nothing to explain symptoms. CT ordered to eval for stones and multiple stones in left ureter. Given flomax, pain meds in ED. Tolerating PO with pain controlled in ED. H/o 'sulfa' allergy but unsure of reaction  as it was when she was a child. Doesn't think it was anaphylactic. rx for pain meds, zofran, flomax given. Gave rx for keflex in case her urinary symptoms persist. Gave info on straining urine and gave info to follow up with urology.    Discharge instructions, including strict return precautions for new or worsening symptoms, given. Patient and/or family verbalized understanding and agreement with the plan as described.   Labs, studies and imaging reviewed by myself and considered in medical decision making if ordered. Imaging interpreted by radiology. Pt was discussed with my attending, Dr. Rogene Houston.  Discharge Medication List as of 04/04/2014 12:57 AM    START taking these medications   Details  cephALEXin (KEFLEX) 500 MG capsule Take 1 capsule (500 mg total) by mouth 4 (four) times daily., Starting 04/05/2014, Last dose on Sun 04/12/14, Print    HYDROcodone-acetaminophen (NORCO/VICODIN) 5-325 MG per tablet Take 1-2 tablets by mouth every 4 (four) hours as needed for moderate pain or severe pain., Starting 04/04/2014, Until Discontinued, Print    ondansetron (ZOFRAN ODT) 4 MG disintegrating tablet 4mg  ODT q4 hours prn nausea/vomit, Print    tamsulosin (FLOMAX) 0.4 MG CAPS capsule Take 1 capsule (0.4 mg total) by mouth daily., Starting 04/04/2014, Until Discontinued, Print        Follow-up Information   Follow up with  [place]. Schedule an appointment as soon as possible for a visit in 1 week. (ED follow up for new kidney stones)    Contact information:   [address] [place] [address] [phone]         Merrily Pew, MD 04/04/14 1213

## 2014-04-03 NOTE — ED Notes (Signed)
Pt in c/o lower abd pain with n/v/d, states she has been having this pain recently but it is normally relieved after a bowel movement, no relief today, denies fever

## 2014-04-03 NOTE — ED Notes (Signed)
Patient returned from CT

## 2014-04-04 MED ORDER — MORPHINE SULFATE 4 MG/ML IJ SOLN
6.0000 mg | Freq: Once | INTRAMUSCULAR | Status: AC
  Administered 2014-04-04: 6 mg via INTRAVENOUS
  Filled 2014-04-04: qty 2

## 2014-04-04 MED ORDER — KETOROLAC TROMETHAMINE 30 MG/ML IJ SOLN
15.0000 mg | Freq: Once | INTRAMUSCULAR | Status: DC
  Filled 2014-04-04: qty 1

## 2014-04-04 MED ORDER — TAMSULOSIN HCL 0.4 MG PO CAPS
0.4000 mg | ORAL_CAPSULE | Freq: Once | ORAL | Status: AC
Start: 2014-04-04 — End: 2014-04-04
  Administered 2014-04-04: 0.4 mg via ORAL
  Filled 2014-04-04: qty 1

## 2014-04-04 MED ORDER — KETOROLAC TROMETHAMINE 15 MG/ML IJ SOLN
15.0000 mg | Freq: Once | INTRAMUSCULAR | Status: AC
  Administered 2014-04-04: 15 mg via INTRAVENOUS

## 2014-04-04 MED ORDER — TAMSULOSIN HCL 0.4 MG PO CAPS: 0.4000 mg | ORAL_CAPSULE | Freq: Every day | ORAL | Status: DC

## 2014-04-04 MED ORDER — CEPHALEXIN 500 MG PO CAPS
500.0000 mg | ORAL_CAPSULE | Freq: Four times a day (QID) | ORAL | Status: DC
Start: 2014-04-05 — End: 2014-04-12

## 2014-04-04 MED ORDER — HYDROCODONE-ACETAMINOPHEN 5-325 MG PO TABS: 1.0000 | ORAL_TABLET | ORAL | Status: DC | PRN

## 2014-04-04 MED ORDER — ONDANSETRON 4 MG PO TBDP: ORAL_TABLET | ORAL | Status: DC

## 2014-04-12 ENCOUNTER — Encounter (HOSPITAL_COMMUNITY): Payer: Self-pay | Admitting: Emergency Medicine

## 2014-04-12 ENCOUNTER — Emergency Department (HOSPITAL_COMMUNITY)
Admission: EM | Admit: 2014-04-12 | Discharge: 2014-04-12 | Disposition: A | Discharge status: Home / Self Care | Payer: BC Managed Care – PPO | Attending: Emergency Medicine | Admitting: Emergency Medicine

## 2014-04-12 DIAGNOSIS — R319 Hematuria, unspecified: Secondary | ICD-10-CM

## 2014-04-12 DIAGNOSIS — M129 Arthropathy, unspecified: Secondary | ICD-10-CM | POA: Insufficient documentation

## 2014-04-12 DIAGNOSIS — Z7982 Long term (current) use of aspirin: Secondary | ICD-10-CM | POA: Insufficient documentation

## 2014-04-12 DIAGNOSIS — Z8619 Personal history of other infectious and parasitic diseases: Secondary | ICD-10-CM | POA: Insufficient documentation

## 2014-04-12 DIAGNOSIS — Z8639 Personal history of other endocrine, nutritional and metabolic disease: Secondary | ICD-10-CM | POA: Insufficient documentation

## 2014-04-12 DIAGNOSIS — M949 Disorder of cartilage, unspecified: Secondary | ICD-10-CM

## 2014-04-12 DIAGNOSIS — Z862 Personal history of diseases of the blood and blood-forming organs and certain disorders involving the immune mechanism: Secondary | ICD-10-CM | POA: Insufficient documentation

## 2014-04-12 DIAGNOSIS — Z792 Long term (current) use of antibiotics: Secondary | ICD-10-CM | POA: Insufficient documentation

## 2014-04-12 DIAGNOSIS — Z8719 Personal history of other diseases of the digestive system: Secondary | ICD-10-CM | POA: Insufficient documentation

## 2014-04-12 DIAGNOSIS — N200 Calculus of kidney: Secondary | ICD-10-CM | POA: Insufficient documentation

## 2014-04-12 DIAGNOSIS — R109 Unspecified abdominal pain: Secondary | ICD-10-CM

## 2014-04-12 DIAGNOSIS — M899 Disorder of bone, unspecified: Secondary | ICD-10-CM | POA: Insufficient documentation

## 2014-04-12 DIAGNOSIS — Z880 Allergy status to penicillin: Secondary | ICD-10-CM | POA: Insufficient documentation

## 2014-04-12 LAB — CBC WITH DIFFERENTIAL/PLATELET
Basophils Absolute: 0 10*3/uL (ref 0.0–0.1)
Basophils Relative: 0 % (ref 0–1)
Eosinophils Absolute: 0 10*3/uL (ref 0.0–0.7)
Eosinophils Relative: 0 % (ref 0–5)
HCT: 34.3 % — ABNORMAL LOW (ref 36.0–46.0)
Hemoglobin: 11.4 g/dL — ABNORMAL LOW (ref 12.0–15.0)
LYMPHS ABS: 0.8 10*3/uL (ref 0.7–4.0)
Lymphocytes Relative: 10 % — ABNORMAL LOW (ref 12–46)
MCH: 33.6 pg (ref 26.0–34.0)
MCHC: 33.2 g/dL (ref 30.0–36.0)
MCV: 101.2 fL — ABNORMAL HIGH (ref 78.0–100.0)
MONOS PCT: 5 % (ref 3–12)
Monocytes Absolute: 0.3 10*3/uL (ref 0.1–1.0)
NEUTROS ABS: 6.5 10*3/uL (ref 1.7–7.7)
NEUTROS PCT: 85 % — AB (ref 43–77)
PLATELETS: 145 10*3/uL — AB (ref 150–400)
RBC: 3.39 MIL/uL — AB (ref 3.87–5.11)
RDW: 14.1 % (ref 11.5–15.5)
WBC: 7.6 10*3/uL (ref 4.0–10.5)

## 2014-04-12 LAB — BASIC METABOLIC PANEL
BUN: 19 mg/dL (ref 6–23)
CO2: 25 mEq/L (ref 19–32)
Calcium: 8.3 mg/dL — ABNORMAL LOW (ref 8.4–10.5)
Chloride: 103 mEq/L (ref 96–112)
Creatinine, Ser: 0.95 mg/dL (ref 0.50–1.10)
GFR calc Af Amer: 73 mL/min — ABNORMAL LOW (ref >90)
GFR calc non Af Amer: 63 mL/min — ABNORMAL LOW (ref >90)
GLUCOSE: 114 mg/dL — AB (ref 70–99)
POTASSIUM: 4 meq/L (ref 3.7–5.3)
SODIUM: 142 meq/L (ref 137–147)

## 2014-04-12 LAB — URINE MICROSCOPIC-ADD ON

## 2014-04-12 LAB — URINALYSIS, ROUTINE W REFLEX MICROSCOPIC
BILIRUBIN URINE: NEGATIVE
Glucose, UA: NEGATIVE mg/dL
Ketones, ur: 40 mg/dL — AB
NITRITE: NEGATIVE
PH: 6.5 (ref 5.0–8.0)
Protein, ur: NEGATIVE mg/dL
SPECIFIC GRAVITY, URINE: 1.016 (ref 1.005–1.030)
UROBILINOGEN UA: 0.2 mg/dL (ref 0.0–1.0)

## 2014-04-12 MED ORDER — HYDROMORPHONE HCL PF 1 MG/ML IJ SOLN
1.0000 mg | Freq: Once | INTRAMUSCULAR | Status: AC
  Administered 2014-04-12: 1 mg via INTRAVENOUS
  Filled 2014-04-12: qty 1

## 2014-04-12 MED ORDER — KETOROLAC TROMETHAMINE 30 MG/ML IJ SOLN
30.0000 mg | Freq: Once | INTRAMUSCULAR | Status: AC
  Administered 2014-04-12: 30 mg via INTRAVENOUS
  Filled 2014-04-12: qty 1

## 2014-04-12 MED ORDER — OXYCODONE-ACETAMINOPHEN 5-325 MG PO TABS: 1.0000 | ORAL_TABLET | ORAL | Status: DC | PRN

## 2014-04-12 MED ORDER — ONDANSETRON HCL 4 MG/2ML IJ SOLN
4.0000 mg | Freq: Once | INTRAMUSCULAR | Status: AC
  Administered 2014-04-12: 4 mg via INTRAVENOUS
  Filled 2014-04-12: qty 2

## 2014-04-12 NOTE — ED Notes (Signed)
PA at bedside.

## 2014-04-12 NOTE — ED Notes (Signed)
Pt aware of the need for a urine sample. 

## 2014-04-12 NOTE — Discharge Instructions (Signed)
Take the prescribed medication as directed. °Follow-up with urology. °Return to the ED for new or worsening symptoms. ° °

## 2014-04-12 NOTE — ED Notes (Signed)
She states she was recently dx with 2 left ureteral stones and has impending app't. With [facility].  She states her left flank pain "became unbearable" today at about 1200 hours and persists.  EMS gave total of 257mcg Fentanyl IV en route to hospital.  She arrives drowsy and in no distress.

## 2014-04-12 NOTE — ED Notes (Signed)
Pt ambulating to restroom with stand by assistance.  

## 2014-04-12 NOTE — ED Notes (Signed)
Bed: WA24 Expected date:  Expected time:  Means of arrival:  Comments: EMS flank pain 

## 2014-04-12 NOTE — ED Provider Notes (Signed)
CSN: [id number]     Arrival date & time 04/12/14  1641 History   First MD Initiated Contact with Patient 04/12/14 1642     Chief Complaint  Patient presents with  . Flank Pain     (Consider location/radiation/quality/duration/timing/severity/associated sxs/prior Treatment) Patient is a 63 y.o. female presenting with flank pain. The history is provided by the patient and medical records.  Flank Pain Associated symptoms include nausea and vomiting.   This is a 63 year old female with past medical history significant for hyperlipidemia, herpes simplex 2, kidney stones, presenting to the ED for left flank pain and suprapubic abdominal pain. Patient was seen in the ED approximately 10 days ago, diagnosis 2 small, 3cm left ureteral stones and discharged home to follow with urology. She states she has been doing well up until yesterday. She states she feels that her stones are moving towards her bladder as she has sharp, stabbing pains in her suprapubic region.  She has not passed any stones or fragments of stones.  She does endorse some nausea and vomiting yesterday, now she only has nausea.  Denies current urinary sx or vaginal complaints. No fever or chills.  Has been taking flomax and hydrocodone at home without adequate pain control.  FU with [facility] pending at this time-- states she is supposed to see Dr. Diona Fanti.  Pt given fentanyl en route with EMS.  VS stable on arrival.  Past Medical History  Diagnosis Date  . Hyperlipidemia   . Family history of early CAD   . Osteopenia   . HSV-2 infection   . Anemia   . 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 Kidney stones    Past Surgical History  Procedure Laterality Date  . Laparoscopic ovarian cystectomy  10/00  . Episiotomy repair    . Dilation and evacuation  1995  . Breast biopsy  12/12    benign    Family History  Problem Relation Age of Onset  . Diabetes Mother   . Diabetes Brother   .  Diabetes Sister   . Colon cancer Brother     mets to liver  . Hypertension Sister     x2  . Heart attack Father   . Congestive Heart Failure Mother   . Heart disease Brother     pacemaker  . Stroke Mother   . Stroke Brother   . Thyroid disease Sister   . Osteoporosis Mother   . Other Mother     gallbladder ruptured   History  Substance Use Topics  . Smoking status: Never Smoker   . Smokeless tobacco: Never Used  . Alcohol Use: Yes     Comment: red or white wine-most days   OB History   Grav Para Term Preterm Abortions TAB SAB Ect Mult Living   3 2        2      Review of Systems  Gastrointestinal: Positive for nausea and vomiting.  Genitourinary: Positive for flank pain.  All other systems reviewed and are negative.     Allergies  Penicillins and Sulfa antibiotics  Home Medications   Prior to Admission medications   Medication Sig Start Date End Date Taking? Authorizing Provider  acetaminophen (TYLENOL) 500 MG tablet Take 500 mg by mouth every 6 (six) hours as needed.    Historical Provider, MD  aspirin EC 81 MG tablet Take 81 mg by mouth daily.    Historical Provider, MD  CELEBREX 200 MG capsule  Take 200 mg by mouth daily as needed for mild pain.  05/13/13   Historical Provider, MD  cephALEXin (KEFLEX) 500 MG capsule Take 1 capsule (500 mg total) by mouth 4 (four) times daily. 04/05/14 04/12/14  Merrily Pew, MD  cholecalciferol (VITAMIN D) 1000 UNITS tablet Take 1,000 Units by mouth daily.    Historical Provider, MD  ciclopirox (LOPROX) 0.77 % cream  11/24/13   Historical Provider, MD  estradiol (ESTRACE) 0.1 MG/GM vaginal cream Place 1 Applicatorful vaginally 2 (two) times a week. 01/13/14   Lyman Speller, MD  HYDROcodone-acetaminophen (NORCO/VICODIN) 5-325 MG per tablet Take 1-2 tablets by mouth every 4 (four) hours as needed for moderate pain or severe pain. 04/04/14   Merrily Pew, MD  Melatonin 10 MG CAPS Take by mouth daily.    Historical Provider, MD  MILK  THISTLE PO Take by mouth.    Historical Provider, MD  ondansetron (ZOFRAN ODT) 4 MG disintegrating tablet 4mg  ODT q4 hours prn nausea/vomit 04/04/14   Merrily Pew, MD  tamsulosin (FLOMAX) 0.4 MG CAPS capsule Take 1 capsule (0.4 mg total) by mouth daily. 04/04/14   Merrily Pew, MD   BP 152/68  Pulse 63  Temp(Src) 97.7 F (36.5 C) (Oral)  Resp 16  SpO2 94%  LMP 10/16/2001  Physical Exam  Nursing note and vitals reviewed. Constitutional: She is oriented to person, place, and time. She appears well-developed and well-nourished. No distress.  HENT:  Head: Normocephalic and atraumatic.  Mouth/Throat: Oropharynx is clear and moist.  Eyes: Conjunctivae and EOM are normal. Pupils are equal, round, and reactive to light.  Neck: Normal range of motion. Neck supple.  Cardiovascular: Normal rate, regular rhythm and normal heart sounds.   Pulmonary/Chest: Effort normal and breath sounds normal. No respiratory distress. She has no wheezes.  Abdominal: Soft. Bowel sounds are normal. There is tenderness in the suprapubic area. There is CVA tenderness (left). There is no rigidity and no guarding.  Musculoskeletal: Normal range of motion.  Neurological: She is alert and oriented to person, place, and time.  Skin: Skin is warm. She is not diaphoretic.  Psychiatric: She has a normal mood and affect.    ED Course  Procedures (including critical care time) Labs Review Labs Reviewed  CBC WITH DIFFERENTIAL - Abnormal; Notable for the following:    RBC 3.39 (*)    Hemoglobin 11.4 (*)    HCT 34.3 (*)    MCV 101.2 (*)    Platelets 145 (*)    Neutrophils Relative % 85 (*)    Lymphocytes Relative 10 (*)    All other components within normal limits  BASIC METABOLIC PANEL - Abnormal; Notable for the following:    Glucose, Bld 114 (*)    Calcium 8.3 (*)    GFR calc non Af Amer 63 (*)    GFR calc Af Amer 73 (*)    All other components within normal limits  URINALYSIS, ROUTINE W REFLEX MICROSCOPIC -  Abnormal; Notable for the following:    Color, Urine RED (*)    APPearance CLOUDY (*)    Hgb urine dipstick LARGE (*)    Ketones, ur 40 (*)    Leukocytes, UA SMALL (*)    All other components within normal limits  URINE MICROSCOPIC-ADD ON    Imaging Review No results found.   EKG Interpretation None      MDM   Final diagnoses:  Kidney stones  Left flank pain  Hematuria   63 year old female with known  36mm left ureteral stones dx by CT scan on 04/03/14. Pain and nausea inadequately controlled at home.  We'll obtain basic labs and repeat UA to assess renal function and possible infection. Toradol and Zofran given.  Will reassess.  U/a with large blood as expected, but non-infectious.  Renal function WNL.  Do not feel repeat imaging needed at this time.  Pain not significantly improved after initial meds.  Will give dose of dilaudid.  After dilaudid pain is significantly better.  Pt has tolerated PO without difficulty.  She remains afebrile and non-toxic appearing.  She will be discharged home with urology FU.  Pain meds changed to percocet. Discussed plan with patient, he/she acknowledged understanding and agreed with plan of care.  Return precautions given for new or worsening symptoms.  Larene Pickett, PA-C 04/12/14 2140

## 2014-04-13 NOTE — ED Provider Notes (Signed)
Medical screening examination/treatment/procedure(s) were performed by non-physician practitioner and as supervising physician I was immediately available for consultation/collaboration.   EKG Interpretation None        Hoy Morn, MD 04/13/14 [phone]

## 2014-04-14 ENCOUNTER — Other Ambulatory Visit: Payer: Self-pay | Admitting: Urology

## 2014-04-15 ENCOUNTER — Encounter (HOSPITAL_BASED_OUTPATIENT_CLINIC_OR_DEPARTMENT_OTHER): Payer: Self-pay | Admitting: *Deleted

## 2014-04-15 NOTE — Progress Notes (Signed)
NPO AFTER MN.  ARRIVE AT 0830. CURRENT LAB RESULTS AND EKG IN CHART AND EPIC.  MAY TAKE ONE TYPE PAIN RX / NAUSEA RX IF NEEDED W/ SIPS OF WATER AM DOS.

## 2014-04-18 NOTE — ED Provider Notes (Signed)
I saw and evaluated the patient, reviewed the resident's note and I agree with the findings and plan.   EKG Interpretation None      Results for orders placed during the hospital encounter of 04/03/14  CBC WITH DIFFERENTIAL      Result Value Ref Range   WBC 8.8  4.0 - 10.5 K/uL   RBC 3.46 (*) 3.87 - 5.11 MIL/uL   Hemoglobin 11.5 (*) 12.0 - 15.0 g/dL   HCT 34.7 (*) 36.0 - 46.0 %   MCV 100.3 (*) 78.0 - 100.0 fL   MCH 33.2  26.0 - 34.0 pg   MCHC 33.1  30.0 - 36.0 g/dL   RDW 13.9  11.5 - 15.5 %   Platelets 169  150 - 400 K/uL   Neutrophils Relative % 60  43 - 77 %   Neutro Abs 5.2  1.7 - 7.7 K/uL   Lymphocytes Relative 34  12 - 46 %   Lymphs Abs 3.0  0.7 - 4.0 K/uL   Monocytes Relative 5  3 - 12 %   Monocytes Absolute 0.5  0.1 - 1.0 K/uL   Eosinophils Relative 1  0 - 5 %   Eosinophils Absolute 0.1  0.0 - 0.7 K/uL   Basophils Relative 0  0 - 1 %   Basophils Absolute 0.0  0.0 - 0.1 K/uL  COMPREHENSIVE METABOLIC PANEL      Result Value Ref Range   Sodium 142  137 - 147 mEq/L   Potassium 3.8  3.7 - 5.3 mEq/L   Chloride 102  96 - 112 mEq/L   CO2 22  19 - 32 mEq/L   Glucose, Bld 119 (*) 70 - 99 mg/dL   BUN 29 (*) 6 - 23 mg/dL   Creatinine, Ser 0.85  0.50 - 1.10 mg/dL   Calcium 9.2  8.4 - 10.5 mg/dL   Total Protein 6.5  6.0 - 8.3 g/dL   Albumin 4.3  3.5 - 5.2 g/dL   AST 17  0 - 37 U/L   ALT 14  0 - 35 U/L   Alkaline Phosphatase 105  39 - 117 U/L   Total Bilirubin 0.7  0.3 - 1.2 mg/dL   GFR calc non Af Amer 72 (*) >90 mL/min   GFR calc Af Amer 84 (*) >90 mL/min  LIPASE, BLOOD      Result Value Ref Range   Lipase 23  11 - 59 U/L  URINALYSIS, ROUTINE W REFLEX MICROSCOPIC      Result Value Ref Range   Color, Urine BROWN (*) YELLOW   APPearance CLOUDY (*) CLEAR   Specific Gravity, Urine 1.022  1.005 - 1.030   pH 6.0  5.0 - 8.0   Glucose, UA NEGATIVE  NEGATIVE mg/dL   Hgb urine dipstick LARGE (*) NEGATIVE   Bilirubin Urine NEGATIVE  NEGATIVE   Ketones, ur 40 (*) NEGATIVE  mg/dL   Protein, ur 100 (*) NEGATIVE mg/dL   Urobilinogen, UA 0.2  0.0 - 1.0 mg/dL   Nitrite NEGATIVE  NEGATIVE   Leukocytes, UA SMALL (*) NEGATIVE  URINE MICROSCOPIC-ADD ON      Result Value Ref Range   Squamous Epithelial / LPF FEW (*) RARE   WBC, UA 3-6  <3 WBC/hpf   RBC / HPF TOO NUMEROUS TO COUNT  <3 RBC/hpf   Bacteria, UA FEW (*) RARE   Urine-Other MUCOUS PRESENT     Ct Abdomen Pelvis Wo Contrast  04/04/2014   CLINICAL DATA:  Left lower  quadrant abdominal pain and hematuria.  EXAM: CT ABDOMEN AND PELVIS WITHOUT CONTRAST  TECHNIQUE: Multidetector CT imaging of the abdomen and pelvis was performed following the standard protocol without IV contrast.  COMPARISON:  Abdominal ultrasound performed 03/16/2009, and MRI of the right hip performed 01/15/2013  FINDINGS: The visualized lung bases are clear.  Two adjacent cysts are again noted at the hepatic dome, stable in appearance from the prior ultrasound. The liver is otherwise unremarkable in appearance. The spleen is within normal limits. The gallbladder is within normal limits. The pancreas and adrenal glands are unremarkable.  Two small stones are seen within the distal left ureter, just proximal to the left vesicoureteral junction, measuring 3 mm each. There is mild asymmetric prominence of the left ureter, without evidence of hydronephrosis. This likely reflects intermittent obstruction. 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 No nonobstructing renal stones are identified. No perinephric stranding is appreciated.  No free fluid is identified. The small bowel is unremarkable in appearance. The stomach is within normal limits. No acute vascular abnormalities are seen. Minimal calcification is noted at the aortic bifurcation and the branches of the abdominal aorta.  The appendix is normal in caliber and contains air, without evidence for appendicitis. Minimal diverticulosis is noted at the sigmoid colon,  without evidence of diverticulitis.  The bladder is mildly distended and grossly unremarkable. The uterus is within normal limits. The ovaries are relatively symmetric; no suspicious adnexal masses are seen. No inguinal lymphadenopathy is seen.  No acute osseous abnormalities are identified.  IMPRESSION: 1. Two small stones within the distal left ureter, just proximal to the left vesicoureteral junction, each measuring approximately 3 mm. There is mild asymmetric prominence of the left ureter, without evidence of hydronephrosis, likely reflecting intermittent obstruction. 2. 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 3. Small hepatic cysts are stable in appearance. 4. Minimal diverticulosis at the sigmoid colon, without evidence of diverticulitis.   Electronically Signed   By: Garald Balding M.D.   On: 04/04/2014 00:36    Patient seen by me. 63 year old female with left lower quadrant abdominal pain associated with nausea and diarrhea. Symptoms beginning worse the last 2 days. Workup revealed hematuria and CT scan confirmed left ureteral stones. Patient retrieve the Flomax and treated with Keflex. Followup arranged. On exam abdomen had some mild to moderate tenderness on the left lower corner. CT scan showed no evidence of diverticulitis.  Fredia Sorrow, MD 04/18/14 [phone]

## 2014-04-20 NOTE — H&P (Signed)
Active Problems Problems  1. Calculus of left ureter (592.1) 2. Pyuria (791.9)  History of Present Illness Brooke Whitaker is a 63 yo WF who was sent from the ER for a left ureteral stone. She had a transient bout of severe pain in the LLQ 3-4 months ago and then again on 5/24. Both of those passed quickly. She had another recurrence and was seen in the ER on 6/18 and then again on 6/28. She had a CT that showed 2 4m stones in the left distal ureter. The stones are only 150HU on CT.  The last pain episode was different and she had increased pain in the back with nausea and vomiting.  She has had some urgency on Saturday. She had gross hematuria. She has had no prior stones or GU history.  She has only mild twinges today with some urgency.  She was given keflex but was told not to take it unless she had more urinary symptoms.   Past Medical History Problems  1. History of arthritis (V13.4) 2. History of hyperlipidemia (V12.29) 3. History of hypertension (V12.59)  Surgical History Problems  1. History of Reported Hx Of Breast Surgery For Biopsy 2. History of Vaginal Surgery  Current Meds 1. Aspirin 81 MG Oral Tablet;  Therapy: (Recorded:30Jun2015) to Recorded 2. Flomax 0.4 MG CP24;  Therapy: (Recorded:30Jun2015) to Recorded 3. Melatonin CAPS;  Therapy: (Recorded:30Jun2015) to Recorded 4. Milk Thistle CAPS;  Therapy: (Recorded:30Jun2015) to Recorded 5. Oxycodone-Acetaminophen 5-325 MG Oral Tablet;  Therapy: (Recorded:30Jun2015) to Recorded 6. Tylenol Sinus CAPS;  Therapy: (Recorded:30Jun2015) to Recorded  Allergies Medication  1. Penicillins 2. Sulfa Drugs  Family History Problems  1. Family history of Deceased : Sister, Mother, Father, Brother 2. Family history of acute myocardial infarction (V17.3) : Mother, Father, Sister, Brother 3. Family history of kidney stones (V18.69) : Brother 4. Family history of malignant neoplasm of prostate ((H72.90 : Brother 5. Family history of renal  failure (V18.69) : Brother  Social History Problems    Caffeine use (V49.89)   Married   Never a smoker   Number of children   Occupation  Review of Systems Genitourinary, constitutional, skin, eye, otolaryngeal, hematologic/lymphatic, cardiovascular, pulmonary, endocrine, musculoskeletal, gastrointestinal, neurological and psychiatric system(s) were reviewed and pertinent findings if present are noted.  Genitourinary: urinary frequency, nocturia and dyspareunia.  Gastrointestinal: nausea, vomiting and diarrhea.  ENT: sinus problems.  Hematologic/Lymphatic: a tendency to easily bruise.  Neurological: headache.    Vitals Vital Signs [Data Includes: Last 1 Day]  Recorded: 30Jun2015 09:43AM  Height: 5 ft 1 in Weight: 145 lb  BMI Calculated: 27.4 BSA Calculated: 1.65 Blood Pressure: 126 / 79 Temperature: 98.1 F Heart Rate: 57  Physical Exam Constitutional: Well nourished and well developed . No acute distress.  ENT:. The ears and nose are normal in appearance.  Neck: The appearance of the neck is normal and no neck mass is present.  Pulmonary: No respiratory distress and normal respiratory rhythm and effort.  Cardiovascular: Heart rate and rhythm are normal . No peripheral edema.  Abdomen: The abdomen is soft and nontender. No masses are palpated. The abdomen is not firm, no rebound and no guarding. Tenderness in the LLQ is present. mild left CVA tenderness. No hernias are palpable. No hepatosplenomegaly noted.  Skin: Normal skin turgor, no visible rash and no visible skin lesions.  Neuro/Psych:. Mood and affect are appropriate.    Results/Data Urine [Data Includes: Last 1 Day]   3[place]  SPECIFIC GRAVITY 1.025   pH 6.0   GLUCOSE NEG mg/dL  BILIRUBIN NEG   KETONE NEG mg/dL  BLOOD MOD   PROTEIN TRACE mg/dL  UROBILINOGEN 0.2 mg/dL  NITRITE NEG   LEUKOCYTE ESTERASE NEG   SQUAMOUS EPITHELIAL/HPF FEW   WBC 3-6 WBC/hpf  RBC 3-6  RBC/hpf  BACTERIA MODERATE   CRYSTALS NONE SEEN   CASTS NONE SEEN   Other MUCUS NOTED    Old records or history reviewed: ER records reviewed.  The following images/tracing/specimen were independently visualized:  CT films and report reviewed. KUB today shows a very faint shadow in the left pelvis that could be the stones but I can't be sure of that. There are no other significant findings on my review today.  The following clinical lab reports were reviewed:  UA reviewed.    Assessment Assessed  1. Calculus of left ureter (592.1) 2. Pyuria (791.9)  She has left distal stones that are fairly radiolucent.   Plan  Calculus of left ureter  1. Follow-up Schedule Surgery Office  Follow-up  Status: Hold For - Appointment   Requested for: [phone] 2. KUB; Status:Resulted - Requires Verification;   Done: [id number] 12:00AM Health Maintenance  3. UA With REFLEX; [Do Not Release]; Status:Resulted - Requires Verification;   Done:  [id number] 09:26AM  I have discussed MET vs ureteroscopy and will get her scheduled for next week for ureteroscopy. I reviewed the risks of bleeding, infection, ureteral injury, need for stenting and secondary procedures, thrombotic events and anesthetic complications.   Urine culture today.

## 2014-04-21 ENCOUNTER — Encounter (HOSPITAL_BASED_OUTPATIENT_CLINIC_OR_DEPARTMENT_OTHER): Payer: BC Managed Care – PPO | Admitting: Anesthesiology

## 2014-04-21 ENCOUNTER — Ambulatory Visit (HOSPITAL_BASED_OUTPATIENT_CLINIC_OR_DEPARTMENT_OTHER): Payer: BC Managed Care – PPO | Admitting: Anesthesiology

## 2014-04-21 ENCOUNTER — Ambulatory Visit (HOSPITAL_BASED_OUTPATIENT_CLINIC_OR_DEPARTMENT_OTHER)
Admission: RE | Admit: 2014-04-21 | Discharge: 2014-04-21 | Disposition: A | Discharge status: Home / Self Care | Payer: BC Managed Care – PPO | Source: Ambulatory Visit | Attending: Urology | Admitting: Urology

## 2014-04-21 ENCOUNTER — Encounter (HOSPITAL_BASED_OUTPATIENT_CLINIC_OR_DEPARTMENT_OTHER): Payer: Self-pay

## 2014-04-21 ENCOUNTER — Encounter (HOSPITAL_BASED_OUTPATIENT_CLINIC_OR_DEPARTMENT_OTHER)
Admission: RE | Disposition: A | Discharge status: Home / Self Care | Payer: Self-pay | Source: Ambulatory Visit | Attending: Urology

## 2014-04-21 DIAGNOSIS — Z7982 Long term (current) use of aspirin: Secondary | ICD-10-CM | POA: Insufficient documentation

## 2014-04-21 DIAGNOSIS — N35919 Unspecified urethral stricture, male, unspecified site: Secondary | ICD-10-CM | POA: Insufficient documentation

## 2014-04-21 DIAGNOSIS — N201 Calculus of ureter: Secondary | ICD-10-CM | POA: Diagnosis present

## 2014-04-21 DIAGNOSIS — Z79899 Other long term (current) drug therapy: Secondary | ICD-10-CM | POA: Insufficient documentation

## 2014-04-21 DIAGNOSIS — R82998 Other abnormal findings in urine: Secondary | ICD-10-CM | POA: Insufficient documentation

## 2014-04-21 DIAGNOSIS — Z882 Allergy status to sulfonamides status: Secondary | ICD-10-CM | POA: Insufficient documentation

## 2014-04-21 DIAGNOSIS — M129 Arthropathy, unspecified: Secondary | ICD-10-CM | POA: Insufficient documentation

## 2014-04-21 HISTORY — DX: Diverticulosis of large intestine without perforation or abscess without bleeding: K57.30

## 2014-04-21 HISTORY — PX: CYSTOSCOPY/RETROGRADE/URETEROSCOPY/STONE EXTRACTION WITH BASKET: SHX5317

## 2014-04-21 HISTORY — DX: Presence of spectacles and contact lenses: Z97.3

## 2014-04-21 HISTORY — DX: Calculus of ureter: N20.1

## 2014-04-21 SURGERY — CYSTOSCOPY, WITH CALCULUS REMOVAL USING BASKET
Anesthesia: General | Site: Ureter | Laterality: Left

## 2014-04-21 MED ORDER — PROMETHAZINE HCL 25 MG/ML IJ SOLN
6.2500 mg | INTRAMUSCULAR | Status: DC | PRN
  Filled 2014-04-21: qty 1

## 2014-04-21 MED ORDER — BELLADONNA ALKALOIDS-OPIUM 16.2-60 MG RE SUPP
RECTAL | Status: AC
  Filled 2014-04-21: qty 1

## 2014-04-21 MED ORDER — CIPROFLOXACIN IN D5W 400 MG/200ML IV SOLN
400.0000 mg | INTRAVENOUS | Status: AC
  Administered 2014-04-21: 400 mg via INTRAVENOUS
  Filled 2014-04-21: qty 200

## 2014-04-21 MED ORDER — KETOROLAC TROMETHAMINE 30 MG/ML IJ SOLN
INTRAMUSCULAR | Status: DC | PRN
  Administered 2014-04-21: 30 mg via INTRAVENOUS

## 2014-04-21 MED ORDER — FENTANYL CITRATE 0.05 MG/ML IJ SOLN
INTRAMUSCULAR | Status: DC | PRN
  Administered 2014-04-21 (×6): 25 ug via INTRAVENOUS
  Administered 2014-04-21: 50 ug via INTRAVENOUS

## 2014-04-21 MED ORDER — PROPOFOL 10 MG/ML IV BOLUS
INTRAVENOUS | Status: DC | PRN
  Administered 2014-04-21: 100 mg via INTRAVENOUS
  Administered 2014-04-21: 200 mg via INTRAVENOUS

## 2014-04-21 MED ORDER — OXYCODONE HCL 5 MG PO TABS
5.0000 mg | ORAL_TABLET | ORAL | Status: DC | PRN
  Filled 2014-04-21: qty 2

## 2014-04-21 MED ORDER — FENTANYL CITRATE 0.05 MG/ML IJ SOLN
25.0000 ug | INTRAMUSCULAR | Status: DC | PRN
  Filled 2014-04-21: qty 1

## 2014-04-21 MED ORDER — MIDAZOLAM HCL 5 MG/5ML IJ SOLN
INTRAMUSCULAR | Status: DC | PRN
  Administered 2014-04-21 (×2): 1 mg via INTRAVENOUS
  Administered 2014-04-21: 2 mg via INTRAVENOUS

## 2014-04-21 MED ORDER — BELLADONNA ALKALOIDS-OPIUM 16.2-60 MG RE SUPP
RECTAL | Status: DC | PRN
  Administered 2014-04-21: 1 via RECTAL

## 2014-04-21 MED ORDER — LACTATED RINGERS IV SOLN
INTRAVENOUS | Status: DC
  Administered 2014-04-21: 09:00:00 via INTRAVENOUS
  Filled 2014-04-21: qty 1000

## 2014-04-21 MED ORDER — STERILE WATER FOR IRRIGATION IR SOLN
Status: DC | PRN
  Administered 2014-04-21: 500 mL

## 2014-04-21 MED ORDER — DEXAMETHASONE SODIUM PHOSPHATE 4 MG/ML IJ SOLN
INTRAMUSCULAR | Status: DC | PRN
  Administered 2014-04-21: 10 mg via INTRAVENOUS

## 2014-04-21 MED ORDER — LACTATED RINGERS IV SOLN
INTRAVENOUS | Status: DC
  Filled 2014-04-21: qty 1000

## 2014-04-21 MED ORDER — MIDAZOLAM HCL 2 MG/2ML IJ SOLN
INTRAMUSCULAR | Status: AC
  Filled 2014-04-21: qty 4

## 2014-04-21 MED ORDER — PHENAZOPYRIDINE HCL 200 MG PO TABS: 200.0000 mg | ORAL_TABLET | Freq: Three times a day (TID) | ORAL | Status: DC | PRN

## 2014-04-21 MED ORDER — CIPROFLOXACIN IN D5W 400 MG/200ML IV SOLN
INTRAVENOUS | Status: AC
  Filled 2014-04-21: qty 200

## 2014-04-21 MED ORDER — SODIUM CHLORIDE 0.9 % IJ SOLN
3.0000 mL | Freq: Two times a day (BID) | INTRAMUSCULAR | Status: DC
  Filled 2014-04-21: qty 3

## 2014-04-21 MED ORDER — FENTANYL CITRATE 0.05 MG/ML IJ SOLN
INTRAMUSCULAR | Status: AC
  Filled 2014-04-21: qty 4

## 2014-04-21 MED ORDER — IOHEXOL 350 MG/ML SOLN
INTRAVENOUS | Status: DC | PRN
  Administered 2014-04-21: 4 mL via URETHRAL

## 2014-04-21 MED ORDER — LACTATED RINGERS IV SOLN
INTRAVENOUS | Status: DC | PRN
  Administered 2014-04-21 (×2): via INTRAVENOUS

## 2014-04-21 MED ORDER — ACETAMINOPHEN 10 MG/ML IV SOLN
INTRAVENOUS | Status: DC | PRN
  Administered 2014-04-21: 1000 mg via INTRAVENOUS

## 2014-04-21 MED ORDER — SODIUM CHLORIDE 0.9 % IR SOLN
Status: DC | PRN
  Administered 2014-04-21: 3000 mL

## 2014-04-21 MED ORDER — ACETAMINOPHEN 325 MG PO TABS
650.0000 mg | ORAL_TABLET | ORAL | Status: DC | PRN
  Filled 2014-04-21: qty 2

## 2014-04-21 MED ORDER — ONDANSETRON HCL 4 MG/2ML IJ SOLN
INTRAMUSCULAR | Status: DC | PRN
  Administered 2014-04-21: 4 mg via INTRAVENOUS

## 2014-04-21 MED ORDER — ACETAMINOPHEN 650 MG RE SUPP
650.0000 mg | RECTAL | Status: DC | PRN
  Filled 2014-04-21: qty 1

## 2014-04-21 MED ORDER — OXYCODONE-ACETAMINOPHEN 5-325 MG PO TABS: 1.0000 | ORAL_TABLET | ORAL | Status: DC | PRN

## 2014-04-21 MED ORDER — SODIUM CHLORIDE 0.9 % IV SOLN
250.0000 mL | INTRAVENOUS | Status: DC | PRN
  Filled 2014-04-21: qty 250

## 2014-04-21 MED ORDER — SODIUM CHLORIDE 0.9 % IJ SOLN
3.0000 mL | INTRAMUSCULAR | Status: DC | PRN
  Filled 2014-04-21: qty 3

## 2014-04-21 MED ORDER — LIDOCAINE HCL (CARDIAC) 20 MG/ML IV SOLN
INTRAVENOUS | Status: DC | PRN
  Administered 2014-04-21: 80 mg via INTRAVENOUS

## 2014-04-21 SURGICAL SUPPLY — 17 items
BAG DRAIN URO-CYSTO SKYTR STRL (DRAIN) ×2 IMPLANT
BAG DRN UROCATH (DRAIN) ×1
BASKET ZERO TIP NITINOL 2.4FR (BASKET) ×1 IMPLANT
BSKT STON RTRVL ZERO TP 2.4FR (BASKET) ×1
CANISTER SUCT LVC 12 LTR MEDI- (MISCELLANEOUS) ×1 IMPLANT
CATH URET 5FR 28IN OPEN ENDED (CATHETERS) ×1 IMPLANT
CLOTH BEACON ORANGE TIMEOUT ST (SAFETY) ×2 IMPLANT
DRAPE CAMERA CLOSED 9X96 (DRAPES) ×2 IMPLANT
FIBER LASER FLEXIVA 365 (UROLOGICAL SUPPLIES) ×1 IMPLANT
GLOVE BIO SURGEON STRL SZ 6.5 (GLOVE) ×1 IMPLANT
GLOVE INDICATOR 6.5 STRL GRN (GLOVE) ×1 IMPLANT
GLOVE SURG SS PI 8.0 STRL IVOR (GLOVE) ×2 IMPLANT
GOWN STRL REUS W/TWL LRG LVL3 (GOWN DISPOSABLE) ×1 IMPLANT
GOWN STRL REUS W/TWL XL LVL3 (GOWN DISPOSABLE) ×1 IMPLANT
GUIDEWIRE STR DUAL SENSOR (WIRE) ×2 IMPLANT
IV NS IRRIG 3000ML ARTHROMATIC (IV SOLUTION) ×2 IMPLANT
PACK CYSTOSCOPY (CUSTOM PROCEDURE TRAY) ×2 IMPLANT

## 2014-04-21 NOTE — Discharge Instructions (Signed)
°  Cystoscopy, Care After °Refer to this sheet in the next few weeks. These instructions provide you with information on caring for yourself after your procedure. Your caregiver may also give you more specific instructions. Your treatment has been planned according to current medical practices, but problems sometimes occur. Call your caregiver if you have any problems or questions after your procedure. °HOME CARE INSTRUCTIONS  °Things you can do to ease any discomfort after your procedure include: °· Drinking enough water and fluids to keep your urine clear or pale yellow. °· Taking a warm bath to relieve any burning feelings. °SEEK IMMEDIATE MEDICAL CARE IF:  °· You have an increase in blood in your urine. °· You notice blood clots in your urine. °· You have difficulty passing urine. °· You have the chills. °· You have abdominal pain. °· You have a fever or persistent symptoms for more than 2-3 days. °· You have a fever and your symptoms suddenly get worse. °MAKE SURE YOU:  °· Understand these instructions. °· Will watch your condition. °· Will get help right away if you are not doing well or get worse. °Document Released: 04/21/2005 Document Revised: 06/04/2013 Document Reviewed: 03/25/2012 °[employer]® Patient Information ©2015 [employer]. This information is not intended to replace advice given to you by your health care provider. Make sure you discuss any questions you have with your health care provider. ° °Post Anesthesia Home Care Instructions ° °Activity: °Get plenty of rest for the remainder of the day. A responsible adult should stay with you for 24 hours following the procedure.  °For the next 24 hours, DO NOT: °-Drive a car °-Operate machinery °-Drink alcoholic beverages °-Take any medication unless instructed by your physician °-Make any legal decisions or sign important papers. ° °Meals: °Start with liquid foods such as gelatin or soup. Progress to regular foods as tolerated. Avoid greasy, spicy,  heavy foods. If nausea and/or vomiting occur, drink only clear liquids until the nausea and/or vomiting subsides. Call your physician if vomiting continues. ° °Special Instructions/Symptoms: °Your throat may feel dry or sore from the anesthesia or the breathing tube placed in your throat during surgery. If this causes discomfort, gargle with warm salt water. The discomfort should disappear within 24 hours. ° °

## 2014-04-21 NOTE — Transfer of Care (Signed)
Immediate Anesthesia Transfer of Care Note  Patient: Brooke Whitaker  Procedure(s) Performed: Procedure(s) (LRB): cystoscopy, left retrograde pylegram, LEFT URETEROSCOPY, laser lithotripsy with holmium laser, STONE EXTRACTION, uretheral calibration (Left)  Patient Location: PACU  Anesthesia Type: General  Level of Consciousness: awake, sedated, patient cooperative and responds to stimulation  Airway & Oxygen Therapy: Patient Spontanous Breathing and Patient connected to face mask oxygen  Post-op Assessment: Report given to PACU RN, Post -op Vital signs reviewed and stable and Patient moving all extremities  Post vital signs: Reviewed and stable  Complications: No apparent anesthesia complications

## 2014-04-21 NOTE — Anesthesia Postprocedure Evaluation (Signed)
Anesthesia Post Note  Patient: Brooke Whitaker  Procedure(s) Performed: Procedure(s) (LRB): cystoscopy, left retrograde pylegram, LEFT URETEROSCOPY, laser lithotripsy with holmium laser, STONE EXTRACTION, uretheral calibration (Left)  Anesthesia type: General  Patient location: PACU  Post pain: Pain level controlled  Post assessment: Post-op Vital signs reviewed  Last Vitals:  Filed Vitals:   04/21/14 1217  BP: 128/72  Pulse: 59  Temp: 36.1 C  Resp: 16    Post vital signs: Reviewed  Level of consciousness: sedated  Complications: No apparent anesthesia complications

## 2014-04-21 NOTE — Interval H&P Note (Signed)
History and Physical Interval Note:  She has not seen a stone pass but has had no further pain.   04/21/2014 9:57 AM  Brooke Whitaker  has presented today for surgery, with the diagnosis of left distal stones  The various methods of treatment have been discussed with the patient and family. After consideration of risks, benefits and other options for treatment, the patient has consented to  Procedure(s): LEFT URETEROSCOPY/STONE EXTRACTION WITH POSSIBLE STENT PLACEMENT (Left) as a surgical intervention .  The patient's history has been reviewed, patient examined, no change in status, stable for surgery.  I have reviewed the patient's chart and labs.  Questions were answered to the patient's satisfaction.     [name] J

## 2014-04-21 NOTE — Anesthesia Procedure Notes (Signed)
Procedure Name: LMA Insertion Date/Time: 04/21/2014 10:15 AM Performed by: Justice Rocher Pre-anesthesia Checklist: Patient identified, Emergency Drugs available, Suction available and Patient being monitored Patient Re-evaluated:Patient Re-evaluated prior to inductionOxygen Delivery Method: Circle System Utilized Preoxygenation: Pre-oxygenation with 100% oxygen Intubation Type: IV induction Ventilation: Mask ventilation without difficulty LMA: LMA inserted LMA Size: 4.0 Number of attempts: 1 Airway Equipment and Method: bite block Placement Confirmation: positive ETCO2 Tube secured with: Tape Dental Injury: Teeth and Oropharynx as per pre-operative assessment

## 2014-04-21 NOTE — Brief Op Note (Signed)
04/21/2014  10:45 AM  PATIENT:  Brooke Whitaker  63 y.o. female  PRE-OPERATIVE DIAGNOSIS:  left distal stones  POST-OPERATIVE DIAGNOSIS:  left distal stones  PROCEDURE:  Procedure(s): cystoscopy, left retrograde pylegram, LEFT URETEROSCOPY, laser lithotripsy with holmium laser, STONE EXTRACTION, uretheral calibration (Left)  SURGEON:  Surgeon(s) and Role:    * Malka So, MD - Primary  PHYSICIAN ASSISTANT:   ASSISTANTS: none   ANESTHESIA:   general  EBL:  Total I/O In: 200 [I.V.:200] Out: -   BLOOD ADMINISTERED:none  DRAINS: none   LOCAL MEDICATIONS USED:  NONE  SPECIMEN:  Source of Specimen:  stone fragments  DISPOSITION OF SPECIMEN:  to family to bring to office  COUNTS:  YES  TOURNIQUET:  * No tourniquets in log *  DICTATION: .Other Dictation: Dictation Number [id number]  PLAN OF CARE: Discharge to home after PACU  PATIENT DISPOSITION:  PACU - hemodynamically stable.   Delay start of Pharmacological VTE agent (>24hrs) due to surgical blood loss or risk of bleeding: not applicable

## 2014-04-21 NOTE — Anesthesia Preprocedure Evaluation (Signed)
Anesthesia Evaluation  Patient identified by MRN, date of birth, ID band Patient awake    Reviewed: Allergy & Precautions, H&P , NPO status , Patient's Chart, lab work & pertinent test results  Airway Mallampati: III TM Distance: >3 FB Neck ROM: Full    Dental  (+) Caps, Dental Advisory Given,    Pulmonary neg pulmonary ROS,  breath sounds clear to auscultation  Pulmonary exam normal       Cardiovascular negative cardio ROS  Rhythm:Regular Rate:Normal     Neuro/Psych negative neurological ROS  negative psych ROS   GI/Hepatic negative GI ROS, Neg liver ROS,   Endo/Other  negative endocrine ROS  Renal/GU   negative genitourinary   Musculoskeletal negative musculoskeletal ROS (+)   Abdominal   Peds negative pediatric ROS (+)  Hematology negative hematology ROS (+)   Anesthesia Other Findings   Reproductive/Obstetrics negative OB ROS                           Anesthesia Physical Anesthesia Plan  ASA: II  Anesthesia Plan: General   Post-op Pain Management:    Induction: Intravenous  Airway Management Planned: LMA  Additional Equipment:   Intra-op Plan:   Post-operative Plan: Extubation in OR  Informed Consent: I have reviewed the patients History and Physical, chart, labs and discussed the procedure including the risks, benefits and alternatives for the proposed anesthesia with the patient or authorized representative who has indicated his/her understanding and acceptance.   Dental advisory given  Plan Discussed with: CRNA  Anesthesia Plan Comments:         Anesthesia Quick Evaluation

## 2014-04-22 ENCOUNTER — Encounter (HOSPITAL_BASED_OUTPATIENT_CLINIC_OR_DEPARTMENT_OTHER): Payer: Self-pay | Admitting: Urology

## 2014-04-22 NOTE — Op Note (Signed)
Brooke Whitaker, Brooke Whitaker NO.:  [id number]  MEDICAL RECORD NO.:  [id number]  LOCATION:                                 FACILITY:  PHYSICIAN:  Marshall Cork. Jeffie Pollock, M.D.    DATE OF BIRTH:  1951/02/15  DATE OF PROCEDURE:  04/21/2014 DATE OF DISCHARGE:  04/21/2014                              OPERATIVE REPORT   PROCEDURE: 1. Cystoscopy with urethral dilation. 2. Left retrograde pyelogram. 3. Left ureteroscopic stone extraction with holmium laser lithotripsy.  PREOPERATIVE DIAGNOSIS:  Left distal ureteral stones.  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  SURGEON:  Marshall Cork. Jeffie Pollock, M.D.  ANESTHESIA:  General.  SPECIMEN:  Stone fragments.  DRAINS:  None.  COMPLICATIONS:  None.  INDICATIONS:  Brooke Whitaker is a 63 year old white female with history of small left distal ureteral stones that are poorly radiopaque.  She has had multiple episodes of pain and is to undergo ureteroscopic stone extraction today.  FINDINGS AND PROCEDURE:  She was given Cipro and taken to the operating room where general anesthetic was induced.  She was placed in lithotomy position and fitted with PAS hose.  Her perineum and genitalia were prepped with Betadine solution.  She was draped in usual sterile fashion.  Cystoscopy was performed using a 22-French scope, but before I was able to pass the scope, I had to dilate the meatus to 24-French due to some meatal stenosis.  The cystoscope was then inserted.  Inspection revealed an otherwise normal urethra.  The bladder wall was smooth and pale without tumor, stones, or inflammation.  Ureteral orifices were unremarkable.  The left ureteral orifice was cannulated with a 5-French open-end catheter and contrast was instilled.  This revealed delicate ureter and intrarenal collecting system without obvious filling defect on initial retrograde.  On subsequent drainage films, there was a suggestion of ruddiness in  the distal ureter, but I could not detect a specific filling defect, however, based on the fact that the patient had, had long intervals without symptoms and had not passed the stone, I felt ureteroscopic inspection was indicated.  The 6-French short ureteroscope was then assembled and inserted.  I was able to negotiate it up the ureteral orifice without the need for dilation.  A stone was then identified approximately 2 cm proximal to the meatus.  The stone was very spiculated and somewhat elongated and appeared most consistent with a calcium oxalate dihydrate stone.  Once the stone was identified, I felt that lasertripsy was indicated to fragment it to a reasonable size so that could be removed without need for ureteral dilation.  365 micron laser fiber was inserted, was set on 0.5 watts and 5 Hz and the stone was then broken into manageable fragments.  The fragments were then removed with a 0-tipped Nitinol basket, approximately 3 fragments of any size were removed.  There were some remaining sand that was too small to remove, but was felt to be sufficiently small, it should pass without difficulty.  Further inspection of the ureter revealed no significant trauma, so it was not felt that a stent was indicated.  The ureteroscope was removed. The bladder was drained  with the sound.  B and O suppository was placed. The patient was taken down from lithotomy position.  Her anesthetic was reversed.  She was moved to recovery room in stable condition.  There were no complications.     Marshall Cork. Jeffie Pollock, M.D.     JJW/MEDQ  D:  04/21/2014  T:  04/22/2014  Job:  [id number]

## 2014-04-27 ENCOUNTER — Encounter: Payer: Self-pay | Admitting: Obstetrics & Gynecology

## 2014-04-27 NOTE — Progress Notes (Signed)
Pt did not return kit.  Letter sent.

## 2014-08-17 ENCOUNTER — Encounter (HOSPITAL_BASED_OUTPATIENT_CLINIC_OR_DEPARTMENT_OTHER): Payer: Self-pay | Admitting: Urology

## 2014-11-03 ENCOUNTER — Encounter: Payer: Self-pay | Admitting: *Deleted

## 2014-11-05 ENCOUNTER — Encounter: Payer: Self-pay | Admitting: Certified Nurse Midwife

## 2014-11-05 ENCOUNTER — Ambulatory Visit (INDEPENDENT_AMBULATORY_CARE_PROVIDER_SITE_OTHER): Payer: BLUE CROSS/BLUE SHIELD | Admitting: Certified Nurse Midwife

## 2014-11-05 VITALS — BP 110/70 | HR 68 | Resp 16 | Ht 61.75 in | Wt 149.0 lb

## 2014-11-05 DIAGNOSIS — B3731 Acute candidiasis of vulva and vagina: Secondary | ICD-10-CM

## 2014-11-05 DIAGNOSIS — N898 Other specified noninflammatory disorders of vagina: Secondary | ICD-10-CM

## 2014-11-05 DIAGNOSIS — B373 Candidiasis of vulva and vagina: Secondary | ICD-10-CM

## 2014-11-05 DIAGNOSIS — N9489 Other specified conditions associated with female genital organs and menstrual cycle: Secondary | ICD-10-CM

## 2014-11-05 LAB — POCT URINALYSIS DIPSTICK
Bilirubin, UA: NEGATIVE
GLUCOSE UA: NEGATIVE
KETONES UA: NEGATIVE
NITRITE UA: NEGATIVE
Protein, UA: NEGATIVE
Urobilinogen, UA: NEGATIVE
pH, UA: 5

## 2014-11-05 MED ORDER — TERCONAZOLE 0.4 % VA CREA: 1.0000 | TOPICAL_CREAM | Freq: Every day | VAGINAL | Status: DC

## 2014-11-05 NOTE — Patient Instructions (Signed)
Atrophic Vaginitis Atrophic vaginitis is a problem of low levels of estrogen in women. This problem can happen at any age. It is most common in women who have gone through menopause ("the change").  HOW WILL I KNOW IF I HAVE THIS PROBLEM? You may have:  Trouble with peeing (urinating), such as:  Going to the bathroom often.  A hard time holding your pee until you reach a bathroom.  Leaking pee.  Having pain when you pee.  Itching or a burning feeling.  Vaginal bleeding and spotting.  Pain during sex.  Dryness of the vagina.  A yellow, bad-smelling fluid (discharge) coming from the vagina. HOW WILL MY DOCTOR CHECK FOR THIS PROBLEM?  During your exam, your doctor will likely find the problem.  If there is a vaginal fluid, it may be checked for infection. HOW WILL THIS PROBLEM BE TREATED? Keep the vulvar skin as clean as possible. Moisturizers and lubricants can help with some of the symptoms. Estrogen replacement can help. There are 2 ways to take estrogen:  Systemic estrogen gets estrogen to your whole body. It takes many weeks or months before the symptoms get better.  You take an estrogen pill.  You use a skin patch. This is a patch that you put on your skin.  If you still have your uterus, your doctor may ask you to take a hormone. Talk to your doctor about the right medicine for you.  Estrogen cream.  This puts estrogen only at the part of your body where you apply it. The cream is put into the vagina or put on the vulvar skin. For some women, estrogen cream works faster than pills or the patch. CAN ALL WOMEN WITH THIS PROBLEM USE ESTROGEN? No. Women with certain types of cancer, liver problems, or problems with blood clots should not take estrogen. Your doctor can help you decide the best treatment for your symptoms. Document Released: 03/20/2008 Document Revised: 10/07/2013 Document Reviewed: 03/20/2008 [facility] Patient Information 2015 [place], [place]. This  information is not intended to replace advice given to you by your health care provider. Make sure you discuss any questions you have with your health care provider. Monilial Vaginitis Vaginitis in a soreness, swelling and redness (inflammation) of the vagina and vulva. Monilial vaginitis is not a sexually transmitted infection. CAUSES  Yeast vaginitis is caused by yeast (candida) that is normally found in your vagina. With a yeast infection, the candida has overgrown in number to a point that upsets the chemical balance. SYMPTOMS   White, thick vaginal discharge.  Swelling, itching, redness and irritation of the vagina and possibly the lips of the vagina (vulva).  Burning or painful urination.  Painful intercourse. DIAGNOSIS  Things that may contribute to monilial vaginitis are:  Postmenopausal and virginal states.  Pregnancy.  Infections.  Being tired, sick or stressed, especially if you had monilial vaginitis in the past.  Diabetes. Good control will help lower the chance.  Birth control pills.  Tight fitting garments.  Using bubble bath, feminine sprays, douches or deodorant tampons.  Taking certain medications that kill germs (antibiotics).  Sporadic recurrence can occur if you become ill. TREATMENT  Your caregiver will give you medication.  There are several kinds of anti monilial vaginal creams and suppositories specific for monilial vaginitis. For recurrent yeast infections, use a suppository or cream in the vagina 2 times a week, or as directed.  Anti-monilial or steroid cream for the itching or irritation of the vulva may also be used. Get  your caregiver's permission.  Painting the vagina with methylene blue solution may help if the monilial cream does not work.  Eating yogurt may help prevent monilial vaginitis. HOME CARE INSTRUCTIONS   Finish all medication as prescribed.  Do not have sex until treatment is completed or after your caregiver tells you it  is okay.  Take warm sitz baths.  Do not douche.  Do not use tampons, especially scented ones.  Wear cotton underwear.  Avoid tight pants and panty hose.  Tell your sexual partner that you have a yeast infection. They should go to their caregiver if they have symptoms such as mild rash or itching.  Your sexual partner should be treated as well if your infection is difficult to eliminate.  Practice safer sex. Use condoms.  Some vaginal medications cause latex condoms to fail. Vaginal medications that harm condoms are:  Cleocin cream.  Butoconazole (Femstat).  Terconazole (Terazol) vaginal suppository.  Miconazole (Monistat) (may be purchased over the counter). SEEK MEDICAL CARE IF:   You have a temperature by mouth above 102 F (38.9 C).  The infection is getting worse after 2 days of treatment.  The infection is not getting better after 3 days of treatment.  You develop blisters in or around your vagina.  You develop vaginal bleeding, and it is not your menstrual period.  You have pain when you urinate.  You develop intestinal problems.  You have pain with sexual intercourse. Document Released: 07/12/2005 Document Revised: 12/25/2011 Document Reviewed: 03/26/2009 [facility] Patient Information 2015 [place], [place]. This information is not intended to replace advice given to you by your health care provider. Make sure you discuss any questions you have with your health care provider.

## 2014-11-05 NOTE — Progress Notes (Signed)
65 y.o. married white female g3p2002 here with complaint of vulva symptoms of burning and sore, with occasional vaginal odor.Describes discharge as none noted.. Patient uses Estrace cream for vaginal dryness and sometimes not as regular as she should. Occasional incontinence, no change. Onset of symptoms 3-4 days ago. Denies new personal products. Patient has not been sexually active due to discomfort with dryness. No STD concerns. Urinary symptoms None .   O:Healthy female WDWN Affect: normal, orientation x 3  Exam: Abdomen: soft Lymph node: no enlargement or tenderness Pelvic exam: External genital: scaling noted on right labia with exudate and slight redness, no lesions noted, left labia small amount of scaling noted. Wet prep taken BUS: negative Vagina: scant discharge tissue pale and tender  Ph:4.5   ,Wet prep taken Cervix: normal, non tender Uterus: normal, non tender Adnexa:normal, non tender, no masses or fullness noted   Wet Prep results: Poct urine-rbc tr,wbc-tr  A:Yeast vaginitis and vulvitis Atrophic vaginitis   P:Discussed findings of yeast vaginitis and vulvitis and etiology. Discussed Aveeno or baking soda sitz bath for comfort. Discussed Aveeno anti itch cream for comfort if needed, in addition to Rx. Rx Terazol 7 vaginal cream see order Discussed after treatment completed to start with coconut oil every hs for 2 weeks before attempting sexual activity and to use coconut oil on spouse also when ready to try. Patient will call if problems. Encouraged to increase water intake to help with tissue hydration.   Rv prn   Rv prn

## 2014-11-08 NOTE — Progress Notes (Signed)
Reviewed personally.  M. Suzanne Ruchi Stoney, MD.  

## 2014-11-09 ENCOUNTER — Telehealth: Payer: Self-pay | Admitting: Cardiovascular Disease

## 2014-11-09 DIAGNOSIS — Z8249 Family history of ischemic heart disease and other diseases of the circulatory system: Secondary | ICD-10-CM

## 2014-11-09 DIAGNOSIS — E785 Hyperlipidemia, unspecified: Secondary | ICD-10-CM

## 2014-11-09 NOTE — Telephone Encounter (Signed)
Returned call to patient she stated she would like to have fasting lab done before her appointment with Dr.Berry.Lab orders mailed to patient.

## 2014-11-09 NOTE — Telephone Encounter (Signed)
Pt called in stating that she would like her lab orders mailed to her so that she can have them done before she comes in to see Dr. Gwenlyn Found.   Thanks

## 2014-11-10 ENCOUNTER — Encounter: Payer: Self-pay | Admitting: Cardiovascular Disease

## 2014-11-10 ENCOUNTER — Ambulatory Visit: Payer: BC Managed Care – PPO | Admitting: Cardiovascular Disease

## 2014-11-19 ENCOUNTER — Telehealth: Payer: Self-pay | Admitting: Obstetrics & Gynecology

## 2014-11-19 ENCOUNTER — Ambulatory Visit: Payer: BLUE CROSS/BLUE SHIELD | Admitting: Obstetrics & Gynecology

## 2014-11-19 NOTE — Telephone Encounter (Signed)
Spoke with patient to see if having an outbreak since last refill of Valtrex was 05/16/13. States she saw DL in 1/16 for vulvar irritation-was treated with Terazol 7. Seemed to help but then soreness and irritation moved to the opposite side. She is not able to see if any areas/sores, but was going to try valtrex to see if this would help. Appointment for 11/20/14 made for evaluation. Patient aware we will give her a new Rx tomorrow when she is seen.//kn

## 2014-11-19 NOTE — Telephone Encounter (Signed)
Patient calling for refills on Valtrex.  [facility]

## 2014-11-20 ENCOUNTER — Encounter: Payer: Self-pay | Admitting: Obstetrics & Gynecology

## 2014-11-20 ENCOUNTER — Ambulatory Visit (INDEPENDENT_AMBULATORY_CARE_PROVIDER_SITE_OTHER): Payer: BLUE CROSS/BLUE SHIELD | Admitting: Obstetrics & Gynecology

## 2014-11-20 VITALS — BP 118/64 | HR 68 | Resp 16 | Wt 150.8 lb

## 2014-11-20 DIAGNOSIS — B009 Herpesviral infection, unspecified: Secondary | ICD-10-CM | POA: Insufficient documentation

## 2014-11-20 DIAGNOSIS — N766 Ulceration of vulva: Secondary | ICD-10-CM

## 2014-11-20 MED ORDER — LIDOCAINE 5 % EX OINT: 1.0000 "application " | TOPICAL_OINTMENT | Freq: Four times a day (QID) | CUTANEOUS | Status: DC | PRN

## 2014-11-20 MED ORDER — VALACYCLOVIR HCL 500 MG PO TABS: 500.0000 mg | ORAL_TABLET | Freq: Every day | ORAL | Status: DC

## 2014-11-20 NOTE — Progress Notes (Signed)
Subjective:     Patient ID: Brooke Whitaker, female   DOB: 1950/11/29, 64 y.o.   MRN: [id number]  HPI 64 yo G3P2 MWF with hx of intermittent vulvar irritation and itching.  At times is also "stingy".  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  Pt was seen by CNM, French Ana 11/05/14, and was treated for yeast vaginitis.  Pt felt like symptoms improved but now having similar symptoms on the other side of the vulva.  It is now on the left.  Using coconut oil for moisture with good success.  No vaginal bleeding.  No vaginal discharge.  Does have HSV hx but pt states not sure if this is what it is or not as has been a long time since last outbreak.  Last rx she actually threw away because was so old.    Review of Systems  All other systems reviewed and are negative.      Objective:   Physical Exam  Constitutional: She appears well-developed and well-nourished.  Genitourinary: Vagina normal.    There is no rash, tenderness, lesion or injury on the right labia. There is lesion on the left labia. There is no rash, tenderness or injury on the left labia.  Lymphadenopathy:       Right: No inguinal adenopathy present.       Left: No inguinal adenopathy present.   Lesion is quite tender to palpation.    Assessment:     HSV lesion?     Plan:     Restart Valtrex 500mg  bid x 3 days.  Rx given for suppression dosing in case culture is positive.  Pt may benefit from longer dosing. Lidocaine 5% topical ointment every 6 hrs as needed prn comfort.  Small amt only is to be used. HSV culture pending.

## 2014-11-23 LAB — HERPES SIMPLEX VIRUS CULTURE: Organism ID, Bacteria: NOT DETECTED

## 2014-11-25 ENCOUNTER — Telehealth: Payer: Self-pay | Admitting: Emergency Medicine

## 2014-11-25 NOTE — Telephone Encounter (Signed)
Message left to return call to Stacy Sailer at [phone].    

## 2014-11-25 NOTE — Telephone Encounter (Signed)
Spoke with patient. Results given. Patient is agreeable and verbalizes understanding. "It is a lot better. Right now everything seems fine." Patient will return call to office to schedule appointment if are returns.  Routing to provider for final review. Patient agreeable to disposition. Will close encounter

## 2014-11-25 NOTE — Telephone Encounter (Signed)
-----   Message from Lyman Speller, MD sent at 11/24/2014  5:56 PM EST ----- Inform pt culture came back negative.  How is the area?  Is it healed?  If so, no additional follow up needed.  If not significantly better or gone, please make follow up appt.

## 2014-12-08 LAB — LIPID PANEL
CHOLESTEROL: 219 mg/dL — AB (ref 0–200)
HDL: 98 mg/dL (ref >=46)
LDL Cholesterol: 104 mg/dL — ABNORMAL HIGH (ref 0–99)
TRIGLYCERIDES: 87 mg/dL (ref <150)
Total CHOL/HDL Ratio: 2.2 Ratio
VLDL: 17 mg/dL (ref 0–40)

## 2014-12-08 LAB — COMPREHENSIVE METABOLIC PANEL
ALBUMIN: 4.4 g/dL (ref 3.5–5.2)
ALK PHOS: 82 U/L (ref 39–117)
ALT: 18 U/L (ref 0–35)
AST: 22 U/L (ref 0–37)
BUN: 13 mg/dL (ref 6–23)
CHLORIDE: 107 meq/L (ref 96–112)
CO2: 27 mEq/L (ref 19–32)
Calcium: 9 mg/dL (ref 8.4–10.5)
Creat: 0.67 mg/dL (ref 0.50–1.10)
Glucose, Bld: 81 mg/dL (ref 70–99)
Potassium: 4 mEq/L (ref 3.5–5.3)
SODIUM: 144 meq/L (ref 135–145)
Total Bilirubin: 1.1 mg/dL (ref 0.2–1.2)
Total Protein: 6.1 g/dL (ref 6.0–8.3)

## 2014-12-08 LAB — TSH: TSH: 4.551 u[IU]/mL — ABNORMAL HIGH (ref 0.350–4.500)

## 2014-12-16 ENCOUNTER — Ambulatory Visit (INDEPENDENT_AMBULATORY_CARE_PROVIDER_SITE_OTHER): Payer: BLUE CROSS/BLUE SHIELD | Admitting: Cardiovascular Disease

## 2014-12-16 ENCOUNTER — Encounter: Payer: Self-pay | Admitting: Cardiovascular Disease

## 2014-12-16 VITALS — BP 138/78 | HR 87 | Ht 61.0 in | Wt 150.4 lb

## 2014-12-16 DIAGNOSIS — E785 Hyperlipidemia, unspecified: Secondary | ICD-10-CM

## 2014-12-16 NOTE — Patient Instructions (Signed)
Your physician wants you to follow-up in 1 year with Dr. Berry. You will receive a reminder letter in the mail 2 months in advance. If you do not receive a letter, please call our office to schedule the follow-up appointment.  

## 2014-12-16 NOTE — Assessment & Plan Note (Signed)
History of hyperlipidemia not on lipid lowering drugs with recent cholesterol measured 12/08/14 revealing anterolateral to 19, LDL 104 and HDL of 98

## 2014-12-16 NOTE — Progress Notes (Signed)
12/16/2014 Brooke Whitaker   [birth year]  [id number]  Primary Physician [name], MD Primary Cardiologist: [name] Renae Gloss     HPI: The patient returns today for followup. She is a 64 year old, mildly overweight, married Caucasian female, mother of 2, patient of [name] who I saw 08/04/13. Her risk factors include dyslipidemia and a strong family history of heart disease. She had a father who had his first MI at age 30 and died of an MI at age 51. She had a sister who died suddenly of an MI at age 52. She is totally asymptomatic. She had an echo and a Myoview performed March 2011 which were entirely normal. Recent lipid profile performed several weeks ago revealed a total cholesterol of 207, LDL of 108 and HDL of 80. She denies chest pain or shortness of breath..     Current Outpatient Prescriptions  Medication Sig Dispense Refill  . acetaminophen (TYLENOL) 500 MG tablet Take 500 mg by mouth every 6 (six) hours as needed for mild pain or headache.     [name] aspirin EC 81 MG tablet Take 81 mg by mouth every evening.     . Calcium Carbonate (CALTRATE 600 PO) Take by mouth 2 (two) times daily.    . cefdinir (OMNICEF) 300 MG capsule     . celecoxib (CELEBREX) 200 MG capsule Take 200 mg by mouth 2 (two) times daily as needed.     . cetirizine (ZYRTEC) 5 MG tablet Take 5 mg by mouth daily as needed for allergies.    . cholecalciferol (VITAMIN D) 1000 UNITS tablet Take 1,000 Units by mouth every morning.     . Coenzyme Q10 (CO Q 10 PO) Take by mouth. Liquid form    . estradiol (ESTRACE) 0.1 MG/GM vaginal cream Place 1 Applicatorful vaginally 2 (two) times a week.    . Melatonin 10 MG CAPS Take 10 mg by mouth at bedtime.     [name] MILK THISTLE PO Take 1 capsule by mouth every morning.     . pseudoephedrine-acetaminophen (TYLENOL SINUS) 30-500 MG TABS Take 1 tablet by mouth every 4 (four) hours as needed.    . valACYclovir (VALTREX) 500 MG tablet Take 1 tablet  (500 mg total) by mouth daily. 30 tablet 0   No current facility-administered medications for this visit.    Allergies  Allergen Reactions  . Penicillins Other (See Comments)    UNKNOWN Childhood REACTION  . Sulfa Antibiotics Other (See Comments)    UNKNOWN Childhood REACTION    History   Social History  . Marital Status: Married    Spouse Name: N/A  . Number of Children: N/A  . Years of Education: N/A   Occupational History  . Not on file.   Social History Main Topics  . Smoking status: Never Smoker   . Smokeless tobacco: Never Used  . Alcohol Use: 4.2 oz/week    7 Glasses of wine per week     Comment: ONE WINE DAILY  . Drug Use: No  . Sexual Activity:    Partners: Male   Other Topics Concern  . Not on file   Social History Narrative     Review of Systems: General: negative for chills, fever, night sweats or weight changes.  Cardiovascular: negative for chest pain, dyspnea on exertion, edema, orthopnea, palpitations, paroxysmal nocturnal dyspnea or shortness of breath Dermatological: negative for rash Respiratory: negative for cough or wheezing Urologic: negative for hematuria Abdominal: negative for  nausea, vomiting, diarrhea, bright red blood per rectum, melena, or hematemesis Neurologic: negative for visual changes, syncope, or dizziness All other systems reviewed and are otherwise negative except as noted above.    Blood pressure 138/78, pulse 87, height 5\' 1"  (1.549 m), weight 150 lb 6.4 oz (68.221 kg), last menstrual period 10/16/2001.  General appearance: alert and no distress Neck: no adenopathy, no carotid bruit, no JVD, supple, symmetrical, trachea midline and thyroid not enlarged, symmetric, no tenderness/mass/nodules Lungs: clear to auscultation bilaterally Heart: regular rate and rhythm, S1, S2 normal, no murmur, click, rub or gallop Extremities: extremities normal, atraumatic, no cyanosis or edema  EKG total/87 without ST or T-wave changes. I  personally reviewed this EKG  ASSESSMENT AND PLAN:   Hyperlipidemia History of hyperlipidemia not on lipid lowering drugs with recent cholesterol measured 12/08/14 revealing anterolateral to 19, LDL 104 and HDL of [place] MD [place], [facility] 12/16/2014 12:34 PM

## 2015-12-01 ENCOUNTER — Telehealth: Payer: Self-pay | Admitting: Cardiovascular Disease

## 2015-12-01 DIAGNOSIS — Z79899 Other long term (current) drug therapy: Secondary | ICD-10-CM

## 2015-12-01 DIAGNOSIS — E785 Hyperlipidemia, unspecified: Secondary | ICD-10-CM

## 2015-12-01 DIAGNOSIS — Z8249 Family history of ischemic heart disease and other diseases of the circulatory system: Secondary | ICD-10-CM

## 2015-12-01 NOTE — Telephone Encounter (Signed)
Labs ordered for patient to have drawn at her convenience.

## 2015-12-01 NOTE — Telephone Encounter (Signed)
Left msg for patient to call. 

## 2015-12-01 NOTE — Telephone Encounter (Signed)
Pt called to make March fu appt-scheduled in April per her schedule-requesting lab work week prior and would like order sent to [facility]

## 2015-12-03 ENCOUNTER — Ambulatory Visit: Payer: BLUE CROSS/BLUE SHIELD | Admitting: Cardiovascular Disease

## 2016-01-27 ENCOUNTER — Telehealth: Payer: Self-pay

## 2016-01-27 ENCOUNTER — Other Ambulatory Visit: Payer: Self-pay

## 2016-01-27 DIAGNOSIS — E785 Hyperlipidemia, unspecified: Secondary | ICD-10-CM

## 2016-01-27 LAB — HEPATIC FUNCTION PANEL
ALK PHOS: 88 U/L (ref 33–130)
ALT: 18 U/L (ref 6–29)
AST: 21 U/L (ref 10–35)
Albumin: 4.4 g/dL (ref 3.6–5.1)
BILIRUBIN DIRECT: 0.2 mg/dL (ref <=0.2)
Indirect Bilirubin: 0.7 mg/dL (ref 0.2–1.2)
Total Bilirubin: 0.9 mg/dL (ref 0.2–1.2)
Total Protein: 6.2 g/dL (ref 6.1–8.1)

## 2016-01-27 LAB — LIPID PANEL
CHOL/HDL RATIO: 2.2 ratio (ref <=5.0)
CHOLESTEROL: 206 mg/dL — AB (ref 125–200)
HDL: 93 mg/dL (ref >=46)
LDL Cholesterol: 103 mg/dL (ref <130)
Triglycerides: 51 mg/dL (ref <150)
VLDL: 10 mg/dL (ref <30)

## 2016-01-27 NOTE — Telephone Encounter (Signed)
Received call from [place] at [facility] lab.Stated patient there to have lab and she needs a order.Order entered for lipid and hepatic panels.

## 2016-02-04 ENCOUNTER — Ambulatory Visit: Payer: BLUE CROSS/BLUE SHIELD | Admitting: Cardiovascular Disease

## 2016-02-08 ENCOUNTER — Telehealth: Payer: Self-pay | Admitting: Cardiovascular Disease

## 2016-02-08 NOTE — Telephone Encounter (Signed)
Fu  Pt returning RN phone call- lab results. Please call back and discuss.   

## 2016-02-09 NOTE — Telephone Encounter (Signed)
Pt made aware of results no questions at this time.  Pt reminded of 5/10 OV

## 2016-02-21 ENCOUNTER — Telehealth: Payer: Self-pay | Admitting: Cardiovascular Disease

## 2016-02-23 ENCOUNTER — Ambulatory Visit: Payer: BLUE CROSS/BLUE SHIELD | Admitting: Cardiovascular Disease

## 2016-02-25 NOTE — Telephone Encounter (Signed)
Closed Encounter  °

## 2016-03-01 ENCOUNTER — Ambulatory Visit (INDEPENDENT_AMBULATORY_CARE_PROVIDER_SITE_OTHER): Payer: BLUE CROSS/BLUE SHIELD | Admitting: Cardiovascular Disease

## 2016-03-01 ENCOUNTER — Encounter: Payer: Self-pay | Admitting: Cardiovascular Disease

## 2016-03-01 VITALS — BP 120/78 | HR 59 | Ht 61.0 in | Wt 152.6 lb

## 2016-03-01 DIAGNOSIS — R079 Chest pain, unspecified: Secondary | ICD-10-CM

## 2016-03-01 DIAGNOSIS — E785 Hyperlipidemia, unspecified: Secondary | ICD-10-CM | POA: Diagnosis not present

## 2016-03-01 NOTE — Patient Instructions (Signed)
Medication Instructions:  Your physician recommends that you continue on your current medications as directed. Please refer to the Current Medication list given to you today.   Labwork: We will get your most current labwork from your Primary Care Physician   Testing/Procedures: Your physician has requested that you have en exercise stress myoview. For further information please visit [email or web address]. Please follow instruction sheet, as given.   Follow-Up: Your physician wants you to follow-up in: 12 months with Dr Gwenlyn Found. You will receive a reminder letter in the mail two months in advance. If you don't receive a letter, please call our office to schedule the follow-up appointment.       If you need a refill on your cardiac medications before your next appointment, please call your pharmacy.

## 2016-03-01 NOTE — Assessment & Plan Note (Signed)
History of hyperlipidemia not on statin therapy followed by PCP. 

## 2016-03-01 NOTE — Progress Notes (Signed)
03/01/2016 Brooke Whitaker   January 24, 1951  [id number]  Primary Physician Brooke Ly, Whitaker Primary Cardiologist: Brooke Whitaker Brooke Whitaker   HPI:  The patient returns today for followup. She is a 65 year old, mildly overweight, married Caucasian female, mother of 2, patient of [name] who I saw 12/16/14. Her risk factors include dyslipidemia and a strong family history of heart disease. She had a father who had his first MI at age 63 and died of an MI at age 75. She had a sister who died suddenly of an MI at age 52.  She had an echo and a Myoview performed March 2011 which were entirely normal. Her lipid profile followed by her PCP. She has had several episodes of substernal chest pressure since I saw her a year ago.  Current Outpatient Prescriptions  Medication Sig Dispense Refill  . acetaminophen (TYLENOL) 500 MG tablet Take 500 mg by mouth every 6 (six) hours as needed for mild pain or headache.     [name] aspirin EC 81 MG tablet Take 81 mg by mouth every evening.     . Calcium Carbonate (CALTRATE 600 PO) Take by mouth 2 (two) times daily.    . celecoxib (CELEBREX) 200 MG capsule Take 200 mg by mouth 2 (two) times daily as needed.     . cetirizine (ZYRTEC) 5 MG tablet Take 5 mg by mouth daily as needed for allergies.    . cholecalciferol (VITAMIN D) 1000 UNITS tablet Take 1,000 Units by mouth every morning.     . Coenzyme Q10 (CO Q 10 PO) Take by mouth. Liquid form    . Cyanocobalamin (VITAMIN B 12 PO) Take 1 tablet by mouth daily.    . Melatonin 10 MG CAPS Take 10 mg by mouth at bedtime.     [name] MILK THISTLE PO Take 1 capsule by mouth every morning.     . Multiple Vitamin (MULTIVITAMIN) tablet Take 1 tablet by mouth daily.    . multivitamin-lutein (OCUVITE-LUTEIN) CAPS capsule Take 1 capsule by mouth daily.    . valACYclovir (VALTREX) 500 MG tablet Take 1 tablet (500 mg total) by mouth daily. 30 tablet 0  . VITAMIN E PO Take 1 capsule by mouth daily.    . cefdinir  (OMNICEF) 300 MG capsule Reported on 03/01/2016    . estradiol (ESTRACE) 0.1 MG/GM vaginal cream Place 1 Applicatorful vaginally 2 (two) times a week. Reported on 03/01/2016    . pseudoephedrine-acetaminophen (TYLENOL SINUS) 30-500 MG TABS Take 1 tablet by mouth every 4 (four) hours as needed. Reported on 03/01/2016     No current facility-administered medications for this visit.    Allergies  Allergen Reactions  . Penicillins Other (See Comments)    UNKNOWN Childhood REACTION  . Sulfa Antibiotics Other (See Comments)    UNKNOWN Childhood REACTION    Social History   Social History  . Marital Status: Married    Spouse Name: N/A  . Number of Children: N/A  . Years of Education: N/A   Occupational History  . Not on file.   Social History Main Topics  . Smoking status: Never Smoker   . Smokeless tobacco: Never Used  . Alcohol Use: 4.2 oz/week    7 Glasses of wine per week     Comment: ONE WINE DAILY  . Drug Use: No  . Sexual Activity:    Partners: Male   Other Topics Concern  . Not on file   Social History Narrative  Review of Systems: General: negative for chills, fever, night sweats or weight changes.  Cardiovascular: negative for chest pain, dyspnea on exertion, edema, orthopnea, palpitations, paroxysmal nocturnal dyspnea or shortness of breath Dermatological: negative for rash Respiratory: negative for cough or wheezing Urologic: negative for hematuria Abdominal: negative for nausea, vomiting, diarrhea, bright red blood per rectum, melena, or hematemesis Neurologic: negative for visual changes, syncope, or dizziness All other systems reviewed and are otherwise negative except as noted above.    Blood pressure 120/78, pulse 59, height 5\' 1"  (1.549 m), weight 152 lb 9.6 oz (69.219 kg), last menstrual period 10/16/2001.  General appearance: alert and no distress Neck: no adenopathy, no carotid bruit, no JVD, supple, symmetrical, trachea midline and thyroid not  enlarged, symmetric, no tenderness/mass/nodules Lungs: clear to auscultation bilaterally Heart: regular rate and rhythm, S1, S2 normal, no murmur, click, rub or gallop Extremities: extremities normal, atraumatic, no cyanosis or edema  EKG sinus bradycardia at 59 without ST or T-wave changes. I personally reviewed this EKG  ASSESSMENT AND PLAN:   Hyperlipidemia History of hyperlipidemia not on statin therapy followed by PCP      Brooke Whitaker [facility], [facility] 03/01/2016 4:09 PM

## 2016-03-08 ENCOUNTER — Telehealth (HOSPITAL_COMMUNITY): Payer: Self-pay

## 2016-03-08 NOTE — Telephone Encounter (Signed)
Encounter complete. 

## 2016-03-10 ENCOUNTER — Ambulatory Visit (HOSPITAL_COMMUNITY)
Admission: RE | Admit: 2016-03-10 | Discharge: 2016-03-10 | Disposition: A | Discharge status: Home / Self Care | Payer: BLUE CROSS/BLUE SHIELD | Source: Ambulatory Visit | Attending: Urology | Admitting: Urology

## 2016-03-10 DIAGNOSIS — R079 Chest pain, unspecified: Secondary | ICD-10-CM | POA: Diagnosis not present

## 2016-03-10 DIAGNOSIS — R0609 Other forms of dyspnea: Secondary | ICD-10-CM | POA: Diagnosis not present

## 2016-03-10 DIAGNOSIS — E663 Overweight: Secondary | ICD-10-CM | POA: Insufficient documentation

## 2016-03-10 DIAGNOSIS — Z8249 Family history of ischemic heart disease and other diseases of the circulatory system: Secondary | ICD-10-CM | POA: Insufficient documentation

## 2016-03-10 DIAGNOSIS — Z6828 Body mass index (BMI) 28.0-28.9, adult: Secondary | ICD-10-CM | POA: Diagnosis not present

## 2016-03-10 LAB — MYOCARDIAL PERFUSION IMAGING
CHL CUP NUCLEAR SDS: 0
CHL CUP NUCLEAR SRS: 0
CHL CUP NUCLEAR SSS: 0
CHL CUP RESTING HR STRESS: 63 {beats}/min
CSEPED: 7 min
CSEPHR: 96 %
Estimated workload: 9.4 METS
Exercise duration (sec): 35 s
LV sys vol: 18 mL
LVDIAVOL: 57 mL (ref 46–106)
MPHR: 157 {beats}/min
NUC STRESS TID: 1.14
Peak HR: 151 {beats}/min
RPE: 17

## 2016-03-10 MED ORDER — TECHNETIUM TC 99M TETROFOSMIN IV KIT
30.7000 | PACK | Freq: Once | INTRAVENOUS | Status: AC | PRN
  Administered 2016-03-10: 30.7 via INTRAVENOUS
  Filled 2016-03-10: qty 31

## 2016-03-10 MED ORDER — TECHNETIUM TC 99M TETROFOSMIN IV KIT
10.5000 | PACK | Freq: Once | INTRAVENOUS | Status: AC | PRN
  Administered 2016-03-10: 11 via INTRAVENOUS
  Filled 2016-03-10: qty 11

## 2016-05-30 ENCOUNTER — Encounter: Payer: Self-pay | Admitting: Obstetrics & Gynecology

## 2016-05-30 ENCOUNTER — Ambulatory Visit: Payer: BLUE CROSS/BLUE SHIELD | Admitting: Obstetrics & Gynecology

## 2016-05-30 VITALS — BP 122/66 | HR 64 | Resp 12 | Ht 61.25 in | Wt 151.6 lb

## 2016-05-30 DIAGNOSIS — Z205 Contact with and (suspected) exposure to viral hepatitis: Secondary | ICD-10-CM | POA: Diagnosis not present

## 2016-05-30 DIAGNOSIS — Z124 Encounter for screening for malignant neoplasm of cervix: Secondary | ICD-10-CM

## 2016-05-30 DIAGNOSIS — Z0000 Encounter for general adult medical examination without abnormal findings: Secondary | ICD-10-CM

## 2016-05-30 DIAGNOSIS — Z01419 Encounter for gynecological examination (general) (routine) without abnormal findings: Secondary | ICD-10-CM

## 2016-05-30 LAB — POCT URINALYSIS DIPSTICK
Bilirubin, UA: NEGATIVE
GLUCOSE UA: NEGATIVE
Ketones, UA: NEGATIVE
Leukocytes, UA: NEGATIVE
NITRITE UA: NEGATIVE
PH UA: 5
UROBILINOGEN UA: NEGATIVE

## 2016-05-30 MED ORDER — VALACYCLOVIR HCL 500 MG PO TABS: 500.0000 mg | ORAL_TABLET | Freq: Every day | ORAL | 0 refills | Status: DC

## 2016-05-30 NOTE — Progress Notes (Signed)
65 y.o. G3P2 MarriedCaucasianF here for annual exam.  Doing well.  No vaginal bleeding.  Had some chest pain issues.  Myocardial perfusion test was normal.     PCP:  Dr. Joylene Draft.  Has done blood density in his office.    Patient's last menstrual period was 10/16/2001.          Sexually active: No.  The current method of family planning is none.    Exercising: No.  The patient does not participate in regular exercise at present. Smoker:  no  Health Maintenance: Pap:  01/13/2014 negative  History of abnormal Pap:  yes MMG: 06/15/2015 BIRADS 0, U/S 06/23/2015 BIRADS 2 benign Colonoscopy:  08/18/2014 polyp repeat 5 years BMD:   Done with Dr. Rulon Abide in 2016   TDaP:  2010  Pneumonia vaccine(s):  unsure Zostavax:   Never, would like prescription for one today  Hep C testing: drawn today  Screening Labs: PCP, Hb today: PCP, Urine today: trace protein, trace RBC's   reports that she has never smoked. She has never used smokeless tobacco. She reports that she drinks about 4.2 oz of alcohol per week . She reports that she does not use drugs.  Past Medical History:  Diagnosis Date  . Arthritis    RIGHT HIP  . Family history of early CAD   . HSV-2 infection   . Hyperlipidemia   . Left ureteral calculus   . Osteopenia   . Sigmoid diverticulosis   . Wears glasses     Past Surgical History:  Procedure Laterality Date  . BREAST BIOPSY  12/ 2012   benign   . COLONOSCOPY  2010  . CYSTOSCOPY/RETROGRADE/URETEROSCOPY/STONE EXTRACTION WITH BASKET Left 04/21/2014   Procedure: cystoscopy, left retrograde pylegram, LEFT URETEROSCOPY, laser lithotripsy with holmium laser, STONE EXTRACTION, uretheral calibration;  Surgeon: Malka So, MD;  Location: [facility];  Service: Urology;  Laterality: Left;  . DILATION AND EVACUATION  1995  . EPISIOTOMY REPAIR, SEVERE TEAR  1989  . LAPAROSCOPIC OVARIAN CYSTECTOMY  10/ 2000  . NM MYOCAR PERF WALL MOTION  12/15/2009   Normal  . US ECHOCARDIOGRAPHY   12/15/2009   Normal study for age: mild MR,TR and trace PI    Current Outpatient Prescriptions  Medication Sig Dispense Refill  . acetaminophen (TYLENOL) 500 MG tablet Take 500 mg by mouth every 6 (six) hours as needed for mild pain or headache.     [name] aspirin EC 81 MG tablet Take 81 mg by mouth every evening.     . Calcium Carbonate (CALTRATE 600 PO) Take by mouth 2 (two) times daily.    . cefdinir (OMNICEF) 300 MG capsule Reported on 03/01/2016    . celecoxib (CELEBREX) 200 MG capsule Take 200 mg by mouth 2 (two) times daily as needed.     . cetirizine (ZYRTEC) 5 MG tablet Take 5 mg by mouth daily as needed for allergies.    . cholecalciferol (VITAMIN D) 1000 UNITS tablet Take 1,000 Units by mouth every morning.     . Coenzyme Q10 (CO Q 10 PO) Take by mouth. Liquid form    . Cyanocobalamin (VITAMIN B 12 PO) Take 1 tablet by mouth daily.    [name] estradiol (ESTRACE) 0.1 MG/GM vaginal cream Place 1 Applicatorful vaginally 2 (two) times a week. Reported on 03/01/2016    . Melatonin 10 MG CAPS Take 10 mg by mouth at bedtime.     [name] MILK THISTLE PO Take 1 capsule by mouth every morning.     [name]  Multiple Vitamin (MULTIVITAMIN) tablet Take 1 tablet by mouth daily.    . multivitamin-lutein (OCUVITE-LUTEIN) CAPS capsule Take 1 capsule by mouth daily.    . pseudoephedrine-acetaminophen (TYLENOL SINUS) 30-500 MG TABS Take 1 tablet by mouth every 4 (four) hours as needed. Reported on 03/01/2016    . valACYclovir (VALTREX) 500 MG tablet Take 1 tablet (500 mg total) by mouth daily. 30 tablet 0  . VITAMIN E PO Take 1 capsule by mouth daily.     No current facility-administered medications for this visit.     Family History  Problem Relation Age of Onset  . Diabetes Mother   . Diabetes Brother   . Diabetes Sister   . Colon cancer Brother     mets to liver  . Hypertension Sister     x2  . Heart attack Father   . Congestive Heart Failure Mother   . Heart disease Brother     pacemaker  . Stroke Mother   .  Stroke Brother   . Thyroid disease Sister   . Osteoporosis Mother   . Other Mother     gallbladder ruptured    ROS:  Pertinent items are noted in HPI.  Otherwise, a comprehensive ROS was negative.  Exam:   Vitals:   05/30/16 1359  BP: 122/66  Pulse: 64  Resp: 12  Weight: 151 lb 9.6 oz (68.8 kg)  Height: 5' 1.25" (1.556 m)    General appearance: alert, cooperative and appears stated age Head: Normocephalic, without obvious abnormality, atraumatic Neck: no adenopathy, supple, symmetrical, trachea midline and thyroid normal to inspection and palpation Lungs: clear to auscultation bilaterally Breasts: normal appearance, no masses or tenderness Heart: regular rate and rhythm Abdomen: soft, non-tender; bowel sounds normal; no masses,  no organomegaly Extremities: extremities normal, atraumatic, no cyanosis or edema Skin: Skin color, texture, turgor normal. No rashes or lesions Lymph nodes: Cervical, supraclavicular, and axillary nodes normal. No abnormal inguinal nodes palpated Neurologic: Grossly normal   Pelvic: External genitalia:  no lesions              Urethra:  normal appearing urethra with no masses, tenderness or lesions              Bartholins and Skenes: normal                 Vagina: normal appearing vagina with normal color and discharge, no lesions              Cervix: no lesions              Pap taken: Yes.   Bimanual Exam:  Uterus:  normal size, contour, position, consistency, mobility, non-tender              Adnexa: normal adnexa and no mass, fullness, tenderness               Rectovaginal: Confirms               Anus:  normal sphincter tone, no lesions  Chaperone was present for exam.  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

## 2016-05-31 LAB — HEPATITIS C ANTIBODY: HCV AB: NEGATIVE

## 2016-06-01 LAB — IPS PAP TEST WITH HPV

## 2016-06-09 ENCOUNTER — Telehealth: Payer: Self-pay | Admitting: Obstetrics & Gynecology

## 2016-06-09 NOTE — Telephone Encounter (Signed)
Patient calling to check on Vitamin E prescription for vaginal dryness. She has not heard from [place] and would like to start this prescription this weekend. Annual exam on 05-30-16.

## 2016-06-09 NOTE — Telephone Encounter (Signed)
Patient called to check on her prescription for vitamin E. She said is was supposed to go to [place].

## 2016-06-12 NOTE — Telephone Encounter (Signed)
Please let pt know I called the rx in after I saw her but today called the pharmacy myself and they had not gotten the information.  So, I gave this all over the phone.  rx should be ready tomorrow and they will call her to let her know she can pick it up.  Directions are 1 capsule/suppository vaginally two to three times weekly.  Can start out using more frequently to help more quickly.  Rx is for 30 with RFs.  Thanks.

## 2016-06-13 NOTE — Telephone Encounter (Signed)
Call to patient. Notified of update from Dr Sabra Heck and instructions regarding Vitamin E vaginal suppository. Advised should expect call from pharmacy when ready for pick up.  Encounter closed.

## 2016-08-08 ENCOUNTER — Encounter: Payer: Self-pay | Admitting: Cardiovascular Disease

## 2017-06-05 ENCOUNTER — Other Ambulatory Visit: Payer: Self-pay | Admitting: Obstetrics & Gynecology

## 2017-06-05 ENCOUNTER — Encounter: Payer: Self-pay | Admitting: Obstetrics & Gynecology

## 2017-06-05 NOTE — Telephone Encounter (Signed)
Medication refill request: Valtrex  Last AEX:  05/30/16 Sm Next AEX: 08/07/17 SM Last MMG (if hormonal medication request): 06/23/15 [place] Left BIRADS2:Benign  Refill authorized: 05/30/16 #[id number]. Today please advise.

## 2017-06-05 NOTE — Telephone Encounter (Signed)
Patient called requesting a refill for Valtrex. She said she has not had it in awhile but is having an outbreak on her mouth. Pharmacy on file confirmed.

## 2017-06-06 MED ORDER — VALACYCLOVIR HCL 500 MG PO TABS: ORAL_TABLET | ORAL | 1 refills | Status: DC

## 2017-06-06 NOTE — Telephone Encounter (Signed)
Patient checking the status of the refill request.

## 2017-06-06 NOTE — Telephone Encounter (Signed)
Dr. Catalina Gravel you review this request since Dr. Sabra Heck is off today? Thanks

## 2017-06-13 ENCOUNTER — Other Ambulatory Visit: Payer: Self-pay | Admitting: Radiology

## 2017-07-23 ENCOUNTER — Encounter: Payer: Self-pay | Admitting: Obstetrics & Gynecology

## 2017-08-07 ENCOUNTER — Encounter: Payer: Self-pay | Admitting: Obstetrics & Gynecology

## 2017-08-07 ENCOUNTER — Ambulatory Visit: Payer: BLUE CROSS/BLUE SHIELD | Admitting: Obstetrics & Gynecology

## 2017-08-07 VITALS — BP 108/62 | HR 78 | Resp 12 | Ht 61.25 in | Wt 149.0 lb

## 2017-08-07 DIAGNOSIS — Z01419 Encounter for gynecological examination (general) (routine) without abnormal findings: Secondary | ICD-10-CM | POA: Diagnosis not present

## 2017-08-07 MED ORDER — NONFORMULARY OR COMPOUNDED ITEM: 4 refills | Status: DC

## 2017-08-07 NOTE — Progress Notes (Signed)
66 y.o. G3P2 Married Caucasian F here for annual exam.  Having some arthritis issues with hands.    Denies vaginal bleeding.  PCP:  Dr. Joylene Draft.  Blood work was done last fall.  Had BMD this summer.  This was normal.    Patient's last menstrual period was 10/16/2001.          Sexually active: Yes.    The current method of family planning is post menopausal status.    Exercising: Yes.    walking Smoker:  no  Health Maintenance: Pap:  05/30/16 negative, HR HPV negative, 01/13/14 negative  History of abnormal Pap:  yes MMG:  06/13/17 left biopsy: fibroadenomatoid nodule with calcifications Colonoscopy:  09/14/14 polyp- repeat 5 years BMD:   Done with Dr. Joylene Draft in Summer 2018  TDaP:  2018 with PCP  Pneumonia vaccine(s):  never Zostavax:   never Hep C testing: 05/30/16 negative  Screening Labs: PCP, Hb today: PCP   reports that she has never smoked. She has never used smokeless tobacco. She reports that she drinks about 4.2 oz of alcohol per week . She reports that she does not use drugs.  Past Medical History:  Diagnosis Date  . Arthritis    RIGHT HIP  . Family history of early CAD   . HSV-2 infection   . Hyperlipidemia   . Left ureteral calculus   . Osteopenia   . Sigmoid diverticulosis   . Wears glasses     Past Surgical History:  Procedure Laterality Date  . BREAST BIOPSY  12/ 2012   benign   . COLONOSCOPY  2010  . CYSTOSCOPY/RETROGRADE/URETEROSCOPY/STONE EXTRACTION WITH BASKET Left 04/21/2014   Procedure: cystoscopy, left retrograde pylegram, LEFT URETEROSCOPY, laser lithotripsy with holmium laser, STONE EXTRACTION, uretheral calibration;  Surgeon: Malka So, MD;  Location: [facility];  Service: Urology;  Laterality: Left;  . DILATION AND EVACUATION  1995  . EPISIOTOMY REPAIR, SEVERE TEAR  1989  . LAPAROSCOPIC OVARIAN CYSTECTOMY  10/ 2000  . NM MYOCAR PERF WALL MOTION  12/15/2009   Normal  . US ECHOCARDIOGRAPHY  12/15/2009   Normal study for age: mild  MR,TR and trace PI    Current Outpatient Prescriptions  Medication Sig Dispense Refill  . acetaminophen (TYLENOL) 500 MG tablet Take 500 mg by mouth every 6 (six) hours as needed for mild pain or headache.     [name] aspirin EC 81 MG tablet Take 81 mg by mouth every evening.     [name] BLACK COHOSH PO Take by mouth.    . Calcium Carbonate (CALTRATE 600 PO) Take by mouth 2 (two) times daily.    . cetirizine (ZYRTEC) 5 MG tablet Take 5 mg by mouth daily as needed for allergies.    . cholecalciferol (VITAMIN D) 1000 UNITS tablet Take 1,000 Units by mouth every morning.     . Coenzyme Q10 (CO Q 10 PO) Take by mouth. Liquid form    . Cyanocobalamin (VITAMIN B 12 PO) Take 1 tablet by mouth daily.    [name] MILK THISTLE PO Take 1 capsule by mouth every morning.     . Multiple Vitamin (MULTIVITAMIN) tablet Take 1 tablet by mouth daily.    . multivitamin-lutein (OCUVITE-LUTEIN) CAPS capsule Take 1 capsule by mouth daily.    . valACYclovir (VALTREX) 500 MG tablet Take 4 tablets (2000 mg) po q 12 hours x 1 day for fever blister as needed. 30 tablet 1  . VITAMIN E PO Take 1 capsule by mouth daily.  No current facility-administered medications for this visit.     Family History  Problem Relation Age of Onset  . Diabetes Mother   . Congestive Heart Failure Mother   . Stroke Mother   . Osteoporosis Mother   . Other Mother        gallbladder ruptured  . Diabetes Brother   . Dementia Brother   . Diabetes Sister   . Colon cancer Brother        mets to liver  . Hypertension Sister        x2  . Heart attack Father   . Heart disease Brother        pacemaker  . Stroke Brother   . Thyroid disease Sister     ROS:  Pertinent items are noted in HPI.  Otherwise, a comprehensive ROS was negative.  Exam:   BP 108/62 (BP Location: Right Arm, Patient Position: Sitting, Cuff Size: Normal)   Pulse 78   Resp 12   Ht 5' 1.25" (1.556 m)   Wt 149 lb (67.6 kg)   LMP 10/16/2001   BMI 27.92 kg/m   Height: 5' 1.25"  (155.6 cm)  Ht Readings from Last 3 Encounters:  08/07/17 5' 1.25" (1.556 m)  05/30/16 5' 1.25" (1.556 m)  03/10/16 5\' 1"  (1.549 m)    General appearance: alert, cooperative and appears stated age Head: Normocephalic, without obvious abnormality, atraumatic Neck: no adenopathy, supple, symmetrical, trachea midline and thyroid normal to inspection and palpation Lungs: clear to auscultation bilaterally Breasts: normal appearance, no masses or tenderness Heart: regular rate and rhythm Abdomen: soft, non-tender; bowel sounds normal; no masses,  no organomegaly Extremities: extremities normal, atraumatic, no cyanosis or edema Skin: Skin color, texture, turgor normal. No rashes or lesions Lymph nodes: Cervical, supraclavicular, and axillary nodes normal. No abnormal inguinal nodes palpated Neurologic: Grossly normal   Pelvic: External genitalia:  no lesions              Urethra:  normal appearing urethra with no masses, tenderness or lesions              Bartholins and Skenes: normal                 Vagina: normal appearing vagina with normal color and discharge, no lesions              Cervix: no lesions              Pap taken: No. Bimanual Exam:  Uterus:  normal size, contour, position, consistency, mobility, non-tender              Adnexa: normal adnexa and no mass, fullness, tenderness                Rectovaginal: Confirms               Anus:  normal sphincter tone, no lesions  Chaperone was present for exam.  A:  Well Woman with normal exam PMP, no HRT Osteopenia.  Last BMD done this year with Dr. Joylene Draft. H/o HSV 1 Mild SUI Vaginal dryness/painful intercourse  P:   Mammogram guidelines reviewed pap smear not indicated.  Neg pap and neg HR HPV 2017. Valtrex rx not needed.  Will call if is needed. Rx for shingrix vaccination given Rx for Vit E vaginal suppositories 200u/ml to pharmacy.  She can use up to three times weekly.  Is not interested in any vaginal estrogen  products return annually or prn

## 2017-10-18 DIAGNOSIS — H61001 Unspecified perichondritis of right external ear: Secondary | ICD-10-CM | POA: Diagnosis not present

## 2017-10-18 DIAGNOSIS — L820 Inflamed seborrheic keratosis: Secondary | ICD-10-CM | POA: Diagnosis not present

## 2017-12-31 DIAGNOSIS — J020 Streptococcal pharyngitis: Secondary | ICD-10-CM | POA: Diagnosis not present

## 2017-12-31 DIAGNOSIS — J029 Acute pharyngitis, unspecified: Secondary | ICD-10-CM | POA: Diagnosis not present

## 2017-12-31 DIAGNOSIS — J09X2 Influenza due to identified novel influenza A virus with other respiratory manifestations: Secondary | ICD-10-CM | POA: Diagnosis not present

## 2017-12-31 DIAGNOSIS — Z6826 Body mass index (BMI) 26.0-26.9, adult: Secondary | ICD-10-CM | POA: Diagnosis not present

## 2017-12-31 DIAGNOSIS — J111 Influenza due to unidentified influenza virus with other respiratory manifestations: Secondary | ICD-10-CM | POA: Diagnosis not present

## 2018-02-08 DIAGNOSIS — L814 Other melanin hyperpigmentation: Secondary | ICD-10-CM | POA: Diagnosis not present

## 2018-02-08 DIAGNOSIS — I788 Other diseases of capillaries: Secondary | ICD-10-CM | POA: Diagnosis not present

## 2018-02-08 DIAGNOSIS — L820 Inflamed seborrheic keratosis: Secondary | ICD-10-CM | POA: Diagnosis not present

## 2018-02-08 DIAGNOSIS — D1801 Hemangioma of skin and subcutaneous tissue: Secondary | ICD-10-CM | POA: Diagnosis not present

## 2018-02-08 DIAGNOSIS — L304 Erythema intertrigo: Secondary | ICD-10-CM | POA: Diagnosis not present

## 2018-02-08 DIAGNOSIS — L821 Other seborrheic keratosis: Secondary | ICD-10-CM | POA: Diagnosis not present

## 2018-02-08 DIAGNOSIS — L918 Other hypertrophic disorders of the skin: Secondary | ICD-10-CM | POA: Diagnosis not present

## 2018-02-08 DIAGNOSIS — H61001 Unspecified perichondritis of right external ear: Secondary | ICD-10-CM | POA: Diagnosis not present

## 2018-05-15 DIAGNOSIS — Z6825 Body mass index (BMI) 25.0-25.9, adult: Secondary | ICD-10-CM | POA: Diagnosis not present

## 2018-05-15 DIAGNOSIS — W57XXXA Bitten or stung by nonvenomous insect and other nonvenomous arthropods, initial encounter: Secondary | ICD-10-CM | POA: Diagnosis not present

## 2018-05-15 DIAGNOSIS — E7849 Other hyperlipidemia: Secondary | ICD-10-CM | POA: Diagnosis not present

## 2018-06-10 DIAGNOSIS — H52223 Regular astigmatism, bilateral: Secondary | ICD-10-CM | POA: Diagnosis not present

## 2018-06-10 DIAGNOSIS — H5203 Hypermetropia, bilateral: Secondary | ICD-10-CM | POA: Diagnosis not present

## 2018-06-10 DIAGNOSIS — H524 Presbyopia: Secondary | ICD-10-CM | POA: Diagnosis not present

## 2018-07-01 ENCOUNTER — Other Ambulatory Visit: Payer: Self-pay | Admitting: Obstetrics and Gynecology

## 2018-07-01 NOTE — Telephone Encounter (Signed)
Medication refill request: Valtrex  Last AEX:  08-07-17  Next AEX: 10-24-18 Last MMG (if hormonal medication request): 06-13-17 breast BX WNL Refill authorized: please advise

## 2018-07-20 DIAGNOSIS — Z23 Encounter for immunization: Secondary | ICD-10-CM | POA: Diagnosis not present

## 2018-08-08 DIAGNOSIS — M25561 Pain in right knee: Secondary | ICD-10-CM | POA: Diagnosis not present

## 2018-08-08 DIAGNOSIS — M25551 Pain in right hip: Secondary | ICD-10-CM | POA: Diagnosis not present

## 2018-10-23 DIAGNOSIS — E7849 Other hyperlipidemia: Secondary | ICD-10-CM | POA: Diagnosis not present

## 2018-10-23 DIAGNOSIS — E538 Deficiency of other specified B group vitamins: Secondary | ICD-10-CM | POA: Diagnosis not present

## 2018-10-23 DIAGNOSIS — E559 Vitamin D deficiency, unspecified: Secondary | ICD-10-CM | POA: Diagnosis not present

## 2018-10-24 ENCOUNTER — Ambulatory Visit: Payer: BLUE CROSS/BLUE SHIELD | Admitting: Obstetrics & Gynecology

## 2018-10-30 DIAGNOSIS — Z8249 Family history of ischemic heart disease and other diseases of the circulatory system: Secondary | ICD-10-CM | POA: Diagnosis not present

## 2018-10-30 DIAGNOSIS — R58 Hemorrhage, not elsewhere classified: Secondary | ICD-10-CM | POA: Diagnosis not present

## 2018-10-30 DIAGNOSIS — N390 Urinary tract infection, site not specified: Secondary | ICD-10-CM | POA: Diagnosis not present

## 2018-10-30 DIAGNOSIS — E538 Deficiency of other specified B group vitamins: Secondary | ICD-10-CM | POA: Diagnosis not present

## 2018-10-30 DIAGNOSIS — J302 Other seasonal allergic rhinitis: Secondary | ICD-10-CM | POA: Diagnosis not present

## 2018-10-30 DIAGNOSIS — E7849 Other hyperlipidemia: Secondary | ICD-10-CM | POA: Diagnosis not present

## 2018-10-30 DIAGNOSIS — R002 Palpitations: Secondary | ICD-10-CM | POA: Diagnosis not present

## 2018-10-30 DIAGNOSIS — R82998 Other abnormal findings in urine: Secondary | ICD-10-CM | POA: Diagnosis not present

## 2018-10-30 DIAGNOSIS — R05 Cough: Secondary | ICD-10-CM | POA: Diagnosis not present

## 2018-10-30 DIAGNOSIS — E559 Vitamin D deficiency, unspecified: Secondary | ICD-10-CM | POA: Diagnosis not present

## 2018-10-30 DIAGNOSIS — M859 Disorder of bone density and structure, unspecified: Secondary | ICD-10-CM | POA: Diagnosis not present

## 2018-10-30 DIAGNOSIS — Z0000 Encounter for general adult medical examination without abnormal findings: Secondary | ICD-10-CM | POA: Diagnosis not present

## 2018-11-06 DIAGNOSIS — Z1212 Encounter for screening for malignant neoplasm of rectum: Secondary | ICD-10-CM | POA: Diagnosis not present

## 2018-11-14 DIAGNOSIS — L820 Inflamed seborrheic keratosis: Secondary | ICD-10-CM | POA: Diagnosis not present

## 2018-11-29 DIAGNOSIS — M180 Bilateral primary osteoarthritis of first carpometacarpal joints: Secondary | ICD-10-CM | POA: Diagnosis not present

## 2018-11-29 DIAGNOSIS — M65332 Trigger finger, left middle finger: Secondary | ICD-10-CM | POA: Diagnosis not present

## 2018-12-11 DIAGNOSIS — Z0100 Encounter for examination of eyes and vision without abnormal findings: Secondary | ICD-10-CM | POA: Diagnosis not present

## 2019-02-06 DIAGNOSIS — L820 Inflamed seborrheic keratosis: Secondary | ICD-10-CM | POA: Diagnosis not present

## 2019-02-06 DIAGNOSIS — D1801 Hemangioma of skin and subcutaneous tissue: Secondary | ICD-10-CM | POA: Diagnosis not present

## 2019-02-06 DIAGNOSIS — L821 Other seborrheic keratosis: Secondary | ICD-10-CM | POA: Diagnosis not present

## 2019-02-06 DIAGNOSIS — L814 Other melanin hyperpigmentation: Secondary | ICD-10-CM | POA: Diagnosis not present

## 2019-02-21 ENCOUNTER — Ambulatory Visit: Payer: Self-pay | Admitting: Obstetrics & Gynecology

## 2019-03-25 ENCOUNTER — Telehealth: Payer: Self-pay | Admitting: *Deleted

## 2019-03-25 NOTE — Telephone Encounter (Signed)
Left voicemail for patient to call back to schedule AEX.   Please schedule AEX appt.

## 2019-04-07 NOTE — Telephone Encounter (Signed)
AEX scheduled 05/15/19 @2 :00pm

## 2019-04-16 DIAGNOSIS — M65312 Trigger thumb, left thumb: Secondary | ICD-10-CM | POA: Diagnosis not present

## 2019-04-16 DIAGNOSIS — M1812 Unilateral primary osteoarthritis of first carpometacarpal joint, left hand: Secondary | ICD-10-CM | POA: Diagnosis not present

## 2019-05-13 ENCOUNTER — Other Ambulatory Visit: Payer: Self-pay

## 2019-05-13 ENCOUNTER — Other Ambulatory Visit: Payer: Self-pay | Admitting: Obstetrics & Gynecology

## 2019-05-13 NOTE — Telephone Encounter (Signed)
Patient in need of refill on Valtrex. Refill not authorized per pharmacy.

## 2019-05-13 NOTE — Telephone Encounter (Signed)
Spoke with patient. Hx of HSV 2. Patient reports outbreak of blisters around her mouth, started 7/27. Had two, 500mg  tabs of Valtrex left, took that yesterday. Patient is requesting a refill until she can be seen for AEX on 7/30. Patient is aware last AEX 08/07/17, previous Valtrex Rx was denied, she was notified by pharmacy. Advised I will review with Dr. Sabra Heck and return call. Patient agreeable.   Dr. Sabra Heck -please advise on Valtrex RX.

## 2019-05-14 MED ORDER — VALACYCLOVIR HCL 500 MG PO TABS: ORAL_TABLET | ORAL | 0 refills | Status: DC

## 2019-05-15 ENCOUNTER — Other Ambulatory Visit (HOSPITAL_COMMUNITY)
Admission: RE | Admit: 2019-05-15 | Discharge: 2019-05-15 | Disposition: A | Discharge status: Home / Self Care | Payer: Medicare HMO | Source: Ambulatory Visit | Attending: Obstetrics & Gynecology | Admitting: Obstetrics & Gynecology

## 2019-05-15 ENCOUNTER — Ambulatory Visit (INDEPENDENT_AMBULATORY_CARE_PROVIDER_SITE_OTHER): Payer: Medicare HMO | Admitting: Obstetrics & Gynecology

## 2019-05-15 ENCOUNTER — Encounter: Payer: Self-pay | Admitting: Obstetrics & Gynecology

## 2019-05-15 ENCOUNTER — Other Ambulatory Visit: Payer: Self-pay

## 2019-05-15 VITALS — BP 128/78 | HR 76 | Temp 97.7°F | Ht 61.0 in | Wt 140.0 lb

## 2019-05-15 DIAGNOSIS — Z124 Encounter for screening for malignant neoplasm of cervix: Secondary | ICD-10-CM | POA: Insufficient documentation

## 2019-05-15 DIAGNOSIS — Z01419 Encounter for gynecological examination (general) (routine) without abnormal findings: Secondary | ICD-10-CM

## 2019-05-15 MED ORDER — NONFORMULARY OR COMPOUNDED ITEM: 3 refills | Status: DC

## 2019-05-15 MED ORDER — VALACYCLOVIR HCL 500 MG PO TABS: ORAL_TABLET | ORAL | 2 refills | Status: DC

## 2019-05-15 NOTE — Progress Notes (Signed)
68 y.o. G3P2 Married White or Caucasian female here for annual exam.  Having some trigger finger issues.  Seeing Dr. Fredna Dow.  Will likely need surgery.    Denies vaginal bleeding.    Patient's last menstrual period was 10/16/2001.          Sexually active: No.  The current method of family planning is post menopausal status.    Exercising: Yes.    walk Smoker:  no  Health Maintenance: Pap:  05/30/16 neg. HR HPV:neg   01/13/14 Neg  History of abnormal Pap:  yes MMG:  06/13/17 Left Bx: Fibroadenomatoid nodule with calcifications.  She knows this is due.  States she will schedule this.   Colonoscopy:  08/18/14 polyps. F/u 5 years.  Aware this will be due later this year. BMD:   2018 TDaP:  2018 Dr. Joylene Draft  Pneumonia vaccine(s):  No Shingrix:   D/w pt.  She is not really interested in this right now.   Hep C testing: 05/30/16 neg  Screening Labs: PCP   reports that she has never smoked. She has never used smokeless tobacco. She reports current alcohol use of about 7.0 standard drinks of alcohol per week. She reports that she does not use drugs.  Past Medical History:  Diagnosis Date  . Arthritis    RIGHT HIP  . Family history of early CAD   . HSV-2 infection   . Hyperlipidemia   . Left ureteral calculus   . Osteopenia   . Sigmoid diverticulosis   . Wears glasses     Past Surgical History:  Procedure Laterality Date  . BREAST BIOPSY  12/ 2012   benign   . COLONOSCOPY  2010  . CYSTOSCOPY/RETROGRADE/URETEROSCOPY/STONE EXTRACTION WITH BASKET Left 04/21/2014   Procedure: cystoscopy, left retrograde pylegram, LEFT URETEROSCOPY, laser lithotripsy with holmium laser, STONE EXTRACTION, uretheral calibration;  Surgeon: Malka So, MD;  Location: [facility];  Service: Urology;  Laterality: Left;  . DILATION AND EVACUATION  1995  . EPISIOTOMY REPAIR, SEVERE TEAR  1989  . LAPAROSCOPIC OVARIAN CYSTECTOMY  10/ 2000  . NM MYOCAR PERF WALL MOTION  12/15/2009   Normal  . US  ECHOCARDIOGRAPHY  12/15/2009   Normal study for age: mild MR,TR and trace PI    Current Outpatient Medications  Medication Sig Dispense Refill  . acetaminophen (TYLENOL) 500 MG tablet Take 500 mg by mouth every 6 (six) hours as needed for mild pain or headache.     . Calcium Carbonate (CALTRATE 600 PO) Take by mouth 2 (two) times daily.    . cetirizine (ZYRTEC) 5 MG tablet Take 5 mg by mouth daily as needed for allergies.    . cholecalciferol (VITAMIN D) 1000 UNITS tablet Take 1,000 Units by mouth every morning.     . Coenzyme Q10 (CO Q 10 PO) Take by mouth. Liquid form    . Cyanocobalamin (VITAMIN B 12 PO) Take 1 tablet by mouth daily.    [name] ELDERBERRY PO Take by mouth daily.    [name] MILK THISTLE PO Take 1 capsule by mouth every morning.     . Multiple Vitamin (MULTIVITAMIN) tablet Take 1 tablet by mouth daily.    . multivitamin-lutein (OCUVITE-LUTEIN) CAPS capsule Take 1 capsule by mouth daily.    . rosuvastatin (CRESTOR) 5 MG tablet Take 5 mg by mouth daily.    . valACYclovir (VALTREX) 500 MG tablet TAKE 4 TABLETS BY MOUTH EVERY 12 HOURS X 1 DAY FOR FEVER BLISTER AS NEEDED. 30 tablet  0  . VITAMIN E PO Take 1 capsule by mouth daily.     No current facility-administered medications for this visit.     Family History  Problem Relation Age of Onset  . Diabetes Mother   . Congestive Heart Failure Mother   . Stroke Mother   . Osteoporosis Mother   . Other Mother        gallbladder ruptured  . Diabetes Brother   . Dementia Brother   . Diabetes Sister   . Colon cancer Brother        mets to liver  . Hypertension Sister        x2  . Heart attack Father   . Heart disease Brother        pacemaker  . Stroke Brother   . Thyroid disease Sister     Review of Systems  Genitourinary:       Vaginal dryness   All other systems reviewed and are negative.   Exam:   BP 128/78   Pulse 76   Temp 97.7 F (36.5 C) (Temporal)   Ht 5\' 1"  (1.549 m)   Wt 140 lb (63.5 kg)   LMP 10/16/2001    BMI 26.45 kg/m   Height: 5\' 1"  (154.9 cm)  Ht Readings from Last 3 Encounters:  05/15/19 5\' 1"  (1.549 m)  08/07/17 5' 1.25" (1.556 m)  05/30/16 5' 1.25" (1.556 m)    General appearance: alert, cooperative and appears stated age Head: Normocephalic, without obvious abnormality, atraumatic Neck: no adenopathy, supple, symmetrical, trachea midline and thyroid normal to inspection and palpation Lungs: clear to auscultation bilaterally Breasts: normal appearance, no masses or tenderness Heart: regular rate and rhythm Abdomen: soft, non-tender; bowel sounds normal; no masses,  no organomegaly Extremities: extremities normal, atraumatic, no cyanosis or edema Skin: Skin color, texture, turgor normal. No rashes or lesions Lymph nodes: Cervical, supraclavicular, and axillary nodes normal. No abnormal inguinal nodes palpated Neurologic: Grossly normal   Pelvic: External genitalia:  no lesions              Urethra:  normal appearing urethra with no masses, tenderness or lesions              Bartholins and Skenes: normal                 Vagina: normal appearing vagina with normal color and discharge, no lesions              Cervix: no lesions              Pap taken: Yes.   Bimanual Exam:  Uterus:  normal size, contour, position, consistency, mobility, non-tender              Adnexa: normal adnexa and no mass, fullness, tenderness               Rectovaginal: Confirms               Anus:  normal sphincter tone, no lesions  Chaperone was present for exam.  A:  Well Woman with normal exam PMP, no HRT Osteopenia, followed by Dr. Joylene Draft H/O oral HSV Mild SUI Vaginal atrophic changes  P:   Mammogram guidelines reviewed.  She is aware this is due. pap smear obtained today RF for Valtrex 1 gram po x 1, repeat in 12 hours.  Rx to pharmacy. Rx for Vit E vaginal  Aware colonoscopy is due later this year. Return annually or prn

## 2019-05-15 NOTE — Telephone Encounter (Signed)
Patient notified of refill.

## 2019-05-18 LAB — CYTOLOGY - PAP: Diagnosis: NEGATIVE

## 2019-05-29 DIAGNOSIS — D1801 Hemangioma of skin and subcutaneous tissue: Secondary | ICD-10-CM | POA: Diagnosis not present

## 2019-05-29 DIAGNOSIS — L570 Actinic keratosis: Secondary | ICD-10-CM | POA: Diagnosis not present

## 2019-05-29 DIAGNOSIS — L820 Inflamed seborrheic keratosis: Secondary | ICD-10-CM | POA: Diagnosis not present

## 2019-07-11 DIAGNOSIS — R2241 Localized swelling, mass and lump, right lower limb: Secondary | ICD-10-CM | POA: Diagnosis not present

## 2019-08-08 DIAGNOSIS — Z23 Encounter for immunization: Secondary | ICD-10-CM | POA: Diagnosis not present

## 2019-08-10 DIAGNOSIS — R69 Illness, unspecified: Secondary | ICD-10-CM | POA: Diagnosis not present

## 2019-08-18 DIAGNOSIS — M65312 Trigger thumb, left thumb: Secondary | ICD-10-CM | POA: Diagnosis not present

## 2019-08-18 DIAGNOSIS — M180 Bilateral primary osteoarthritis of first carpometacarpal joints: Secondary | ICD-10-CM | POA: Diagnosis not present

## 2019-09-24 DIAGNOSIS — Z20828 Contact with and (suspected) exposure to other viral communicable diseases: Secondary | ICD-10-CM | POA: Diagnosis not present

## 2019-09-26 ENCOUNTER — Other Ambulatory Visit: Payer: Self-pay | Admitting: Orthopedic Surgery

## 2019-10-01 ENCOUNTER — Telehealth (INDEPENDENT_AMBULATORY_CARE_PROVIDER_SITE_OTHER): Payer: Medicare HMO | Admitting: Cardiovascular Disease

## 2019-10-01 ENCOUNTER — Encounter: Payer: Self-pay | Admitting: Cardiovascular Disease

## 2019-10-01 DIAGNOSIS — Z8249 Family history of ischemic heart disease and other diseases of the circulatory system: Secondary | ICD-10-CM

## 2019-10-01 DIAGNOSIS — R002 Palpitations: Secondary | ICD-10-CM

## 2019-10-01 DIAGNOSIS — E782 Mixed hyperlipidemia: Secondary | ICD-10-CM

## 2019-10-01 NOTE — Progress Notes (Signed)
Virtual Visit via Video Note   This visit type was conducted due to national recommendations for restrictions regarding the COVID-19 Pandemic (e.g. social distancing) in an effort to limit this patient's exposure and mitigate transmission in our community.  Due to her co-morbid illnesses, this patient is at least at moderate risk for complications without adequate follow up.  This format is felt to be most appropriate for this patient at this time.  All issues noted in this document were discussed and addressed.  A limited physical exam was performed with this format.  Please refer to the patient's chart for her consent to telehealth for [facility].   Date:  10/01/2019   ID:  Brooke Whitaker, DOB [birth year], MRN [id number]  Patient Location: Home Provider Location: Home  PCP:  Crist Infante, MD  Cardiologist: Dr. Joanell Rising Electrophysiologist:  None   Evaluation Performed:  Follow-Up Visit  Chief Complaint: Palpitations  History of Present Illness:    Brooke Whitaker is a 68 y.o. mildly overweight, married Caucasian female, mother of 2, patient of [name] who I saw 03/01/2016. Her risk factors include dyslipidemia and a strong family history of heart disease. She had a father who had his first MI at age 89 and died of an MI at age 38. She had a sister who died suddenly of an MI at age 31.  She had an echo and a Myoview performed March 2011 which were entirely normal. Her lipid profile followed by her PCP.  Since I saw her over 3 years ago she continues to do well.  She gets rare palpitations.  She denies chest pain.  She is on Crestor for hyperlipidemia followed by her PCP.   The patient does not have symptoms concerning for COVID-19 infection (fever, chills, cough, or new shortness of breath).    Past Medical History:  Diagnosis Date  . Arthritis    RIGHT HIP  . Family history of early CAD   . HSV-2 infection   . Hyperlipidemia   . Left ureteral calculus   .  Osteopenia   . Sigmoid diverticulosis   . Wears glasses    Past Surgical History:  Procedure Laterality Date  . BREAST BIOPSY  12/ 2012   benign   . COLONOSCOPY  2010  . CYSTOSCOPY/RETROGRADE/URETEROSCOPY/STONE EXTRACTION WITH BASKET Left 04/21/2014   Procedure: cystoscopy, left retrograde pylegram, LEFT URETEROSCOPY, laser lithotripsy with holmium laser, STONE EXTRACTION, uretheral calibration;  Surgeon: Malka So, MD;  Location: [facility];  Service: Urology;  Laterality: Left;  . DILATION AND EVACUATION  1995  . EPISIOTOMY REPAIR, SEVERE TEAR  1989  . LAPAROSCOPIC OVARIAN CYSTECTOMY  10/ 2000  . NM MYOCAR PERF WALL MOTION  12/15/2009   Normal  . US ECHOCARDIOGRAPHY  12/15/2009   Normal study for age: mild MR,TR and trace PI     Current Meds  Medication Sig  . acetaminophen (TYLENOL) 500 MG tablet Take 500 mg by mouth every 6 (six) hours as needed for mild pain or headache.   . Calcium Carbonate (CALTRATE 600 PO) Take by mouth 2 (two) times daily.  . cetirizine (ZYRTEC) 5 MG tablet Take 5 mg by mouth daily as needed for allergies.  . cholecalciferol (VITAMIN D) 1000 UNITS tablet Take 1,000 Units by mouth every morning.   . Coenzyme Q10 (CO Q 10 PO) Take by mouth. Liquid form  . Cyanocobalamin (VITAMIN B 12 PO) Take 1 tablet by mouth daily.  [name] ELDERBERRY PO  Take by mouth daily.  [name] MILK THISTLE PO Take 1 capsule by mouth every morning.   . Multiple Vitamin (MULTIVITAMIN) tablet Take 1 tablet by mouth daily.  . multivitamin-lutein (OCUVITE-LUTEIN) CAPS capsule Take 1 capsule by mouth daily.  . NONFORMULARY OR COMPOUNDED ITEM Vitamin E vaginal suppositories 200u/ml.  One pv three times weekly.  . rosuvastatin (CRESTOR) 5 MG tablet Take 5 mg by mouth daily.  . valACYclovir (VALTREX) 500 MG tablet TAKE 4 TABLETS BY MOUTH EVERY 12 HOURS X 1 DAY FOR FEVER BLISTER AS NEEDED.  [name] VITAMIN E PO Take 1 capsule by mouth daily.     Allergies:   Penicillins and Sulfa antibiotics     Social History   Tobacco Use  . Smoking status: Never Smoker  . Smokeless tobacco: Never Used  Substance Use Topics  . Alcohol use: Yes    Alcohol/week: 7.0 standard drinks    Types: 7 Glasses of wine per week  . Drug use: No     Family Hx: The patient's family history includes Colon cancer in her brother; Congestive Heart Failure in her mother; Dementia in her brother; Diabetes in her brother, mother, and sister; Heart attack in her father; Heart disease in her brother; Hypertension in her sister; Osteoporosis in her mother; Other in her mother; Stroke in her brother and mother; Thyroid disease in her sister.  ROS:   Please see the history of present illness.     All other systems reviewed and are negative.   Prior CV studies:   The following studies were reviewed today:  None  Labs/Other Tests and Data Reviewed:    EKG:  No ECG reviewed.  Recent Labs: No results found for requested labs within last 8760 hours.   Recent Lipid Panel Lab Results  Component Value Date/Time   CHOL 206 (H) 01/27/2016 10:03 AM   TRIG 51 01/27/2016 10:03 AM   HDL 93 01/27/2016 10:03 AM   CHOLHDL 2.2 01/27/2016 10:03 AM   LDLCALC 103 01/27/2016 10:03 AM    Wt Readings from Last 3 Encounters:  10/01/19 140 lb (63.5 kg)  05/15/19 140 lb (63.5 kg)  08/07/17 149 lb (67.6 kg)     Objective:    Vital Signs:  BP (!) 144/90   Pulse 68   Ht 5\' 1"  (1.549 m)   Wt 140 lb (63.5 kg)   LMP 10/16/2001   BMI 26.45 kg/m    VITAL SIGNS:  reviewed GEN:  no acute distress RESPIRATORY:  normal respiratory effort, symmetric expansion CARDIOVASCULAR:  no peripheral edema MUSCULOSKELETAL:  no obvious deformities. NEURO:  alert and oriented x 3, no obvious focal deficit PSYCH:  normal affect  ASSESSMENT & PLAN:    1. Hyperlipidemia-on Crestor followed by her PCP 2. Atypical chest pain-none since I saw her 3 years ago.  She had a negative Myoview stress test  03/04/2016 3. Palpitations-infrequent lasting only seconds at a time.  No further evaluation required 4. Family history of heart disease-father had his first myocardial infarction at age 22  COVID-19 Education: The signs and symptoms of COVID-19 were discussed with the patient and how to seek care for testing (follow up with PCP or arrange E-visit).  The importance of social distancing was discussed today.  Time:   Today, I have spent 5 minutes with the patient with telehealth technology discussing the above problems.     Medication Adjustments/Labs and Tests Ordered: Current medicines are reviewed at length with the patient today.  Concerns regarding medicines  are outlined above.   Tests Ordered: No orders of the defined types were placed in this encounter.   Medication Changes: No orders of the defined types were placed in this encounter.   Follow Up:  In Person in 1 year(s)  Signed, Quay Burow, MD  10/01/2019 11:01 AM    [place]

## 2019-10-01 NOTE — Patient Instructions (Signed)
Medication Instructions:  Your physician recommends that you continue on your current medications as directed. Please refer to the Current Medication list given to you today.  If you need a refill on your cardiac medications before your next appointment, please call your pharmacy.   Lab work: NONE  Testing/Procedures: NONE  Follow-Up: At [facility], you and your health needs are our priority.  As part of our continuing mission to provide you with exceptional heart care, we have created designated Provider Care Teams.  These Care Teams include your primary Cardiologist (physician) and Advanced Practice Providers (APPs -  Physician Assistants and Nurse Practitioners) who all work together to provide you with the care you need, when you need it. You may see Dr Berry or one of the following Advanced Practice Providers on your designated Care Team:    Luke Kilroy, PA-C  Callie Goodrich, PA-C  Jesse Cleaver, FNP Your physician wants you to follow-up in: 1 year      

## 2019-11-11 ENCOUNTER — Ambulatory Visit: Payer: Medicare HMO

## 2019-11-13 DIAGNOSIS — E7849 Other hyperlipidemia: Secondary | ICD-10-CM | POA: Diagnosis not present

## 2019-11-13 DIAGNOSIS — M859 Disorder of bone density and structure, unspecified: Secondary | ICD-10-CM | POA: Diagnosis not present

## 2019-11-13 DIAGNOSIS — Z0000 Encounter for general adult medical examination without abnormal findings: Secondary | ICD-10-CM | POA: Diagnosis not present

## 2019-11-13 DIAGNOSIS — L820 Inflamed seborrheic keratosis: Secondary | ICD-10-CM | POA: Diagnosis not present

## 2019-11-13 DIAGNOSIS — E538 Deficiency of other specified B group vitamins: Secondary | ICD-10-CM | POA: Diagnosis not present

## 2019-11-13 DIAGNOSIS — L570 Actinic keratosis: Secondary | ICD-10-CM | POA: Diagnosis not present

## 2019-11-20 DIAGNOSIS — Z780 Asymptomatic menopausal state: Secondary | ICD-10-CM | POA: Diagnosis not present

## 2019-11-20 DIAGNOSIS — D7589 Other specified diseases of blood and blood-forming organs: Secondary | ICD-10-CM | POA: Diagnosis not present

## 2019-11-20 DIAGNOSIS — E785 Hyperlipidemia, unspecified: Secondary | ICD-10-CM | POA: Diagnosis not present

## 2019-11-20 DIAGNOSIS — J302 Other seasonal allergic rhinitis: Secondary | ICD-10-CM | POA: Diagnosis not present

## 2019-11-20 DIAGNOSIS — Z0000 Encounter for general adult medical examination without abnormal findings: Secondary | ICD-10-CM | POA: Diagnosis not present

## 2019-11-20 DIAGNOSIS — M8580 Other specified disorders of bone density and structure, unspecified site: Secondary | ICD-10-CM | POA: Diagnosis not present

## 2019-11-20 DIAGNOSIS — Z8249 Family history of ischemic heart disease and other diseases of the circulatory system: Secondary | ICD-10-CM | POA: Diagnosis not present

## 2019-11-20 DIAGNOSIS — Z1331 Encounter for screening for depression: Secondary | ICD-10-CM | POA: Diagnosis not present

## 2019-11-20 DIAGNOSIS — E559 Vitamin D deficiency, unspecified: Secondary | ICD-10-CM | POA: Diagnosis not present

## 2019-11-20 DIAGNOSIS — E538 Deficiency of other specified B group vitamins: Secondary | ICD-10-CM | POA: Diagnosis not present

## 2019-11-20 DIAGNOSIS — R002 Palpitations: Secondary | ICD-10-CM | POA: Diagnosis not present

## 2019-11-22 ENCOUNTER — Ambulatory Visit: Payer: Medicare HMO | Attending: Internal Medicine

## 2019-11-22 DIAGNOSIS — Z23 Encounter for immunization: Secondary | ICD-10-CM | POA: Insufficient documentation

## 2019-11-22 NOTE — Progress Notes (Signed)
   Covid-19 Vaccination Clinic  Name:  TANINA RAKOCZY    MRN: [id number] DOB: September 17, 1951  11/22/2019  Ms. Behr was observed post Covid-19 immunization for 15 minutes without incidence. She was provided with Vaccine Information Sheet and instruction to access the V-Safe system.   Ms. Dobberstein was instructed to call 911 with any severe reactions post vaccine: [name] Difficulty breathing  . Swelling of your face and throat  . A fast heartbeat  . A bad rash all over your body  . Dizziness and weakness    Immunizations Administered    Name Date Dose VIS Date Route   Pfizer COVID-19 Vaccine 11/22/2019  2:12 PM 0.3 mL 09/26/2019 Intramuscular   Manufacturer: [place]   Lot: [id number]   [place]: [id number]

## 2019-11-28 ENCOUNTER — Ambulatory Visit: Payer: Medicare HMO

## 2019-12-02 ENCOUNTER — Ambulatory Visit (HOSPITAL_BASED_OUTPATIENT_CLINIC_OR_DEPARTMENT_OTHER): Admit: 2019-12-02 | Payer: Medicare HMO | Admitting: Orthopedic Surgery

## 2019-12-02 ENCOUNTER — Encounter (HOSPITAL_BASED_OUTPATIENT_CLINIC_OR_DEPARTMENT_OTHER): Payer: Self-pay

## 2019-12-02 SURGERY — RELEASE, A1 PULLEY, FOR TRIGGER FINGER
Anesthesia: Regional | Laterality: Left

## 2019-12-17 ENCOUNTER — Ambulatory Visit: Payer: Medicare HMO | Attending: Internal Medicine

## 2019-12-17 DIAGNOSIS — Z23 Encounter for immunization: Secondary | ICD-10-CM | POA: Insufficient documentation

## 2019-12-17 NOTE — Progress Notes (Signed)
   Covid-19 Vaccination Clinic  Name:  Brooke Whitaker    MRN: [id number] DOB: 04/27/1951  12/17/2019  Brooke Whitaker was observed post Covid-19 immunization for 15 minutes without incident. She was provided with Vaccine Information Sheet and instruction to access the V-Safe system.   Brooke Whitaker was instructed to call 911 with any severe reactions post vaccine: [name] Difficulty breathing  . Swelling of face and throat  . A fast heartbeat  . A bad rash all over body  . Dizziness and weakness   Immunizations Administered    Name Date Dose VIS Date Route   Pfizer COVID-19 Vaccine 12/17/2019 10:35 AM 0.3 mL 09/26/2019 Intramuscular   Manufacturer: [place]   Lot: [id number]   [place]: [id number]

## 2020-01-01 ENCOUNTER — Ambulatory Visit: Payer: Medicare HMO | Admitting: Podiatry

## 2020-01-01 ENCOUNTER — Other Ambulatory Visit: Payer: Self-pay

## 2020-01-01 DIAGNOSIS — L603 Nail dystrophy: Secondary | ICD-10-CM

## 2020-01-01 DIAGNOSIS — M7981 Nontraumatic hematoma of soft tissue: Secondary | ICD-10-CM | POA: Diagnosis not present

## 2020-01-01 DIAGNOSIS — L608 Other nail disorders: Secondary | ICD-10-CM | POA: Diagnosis not present

## 2020-01-01 NOTE — Progress Notes (Signed)
Subjective:  Patient ID: Brooke Whitaker, female    DOB: 04/08/1951,  MRN: [id number] HPI No chief complaint on file.   69 y.o. female presents with the complaint of a possible nail fungus fibular border hallux right.  She states that is been present now for quite some time  ROS: Denies fever chills nausea vomiting muscle aches pains calf pain back pain chest pain shortness of breath.  Past Medical History:  Diagnosis Date  . Arthritis    RIGHT HIP  . Family history of early CAD   . HSV-2 infection   . Hyperlipidemia   . Left ureteral calculus   . Osteopenia   . Sigmoid diverticulosis   . Wears glasses    Past Surgical History:  Procedure Laterality Date  . BREAST BIOPSY  12/ 2012   benign   . COLONOSCOPY  2010  . CYSTOSCOPY/RETROGRADE/URETEROSCOPY/STONE EXTRACTION WITH BASKET Left 04/21/2014   Procedure: cystoscopy, left retrograde pylegram, LEFT URETEROSCOPY, laser lithotripsy with holmium laser, STONE EXTRACTION, uretheral calibration;  Surgeon: Malka So, MD;  Location: [facility];  Service: Urology;  Laterality: Left;  . DILATION AND EVACUATION  1995  . EPISIOTOMY REPAIR, SEVERE TEAR  1989  . LAPAROSCOPIC OVARIAN CYSTECTOMY  10/ 2000  . NM MYOCAR PERF WALL MOTION  12/15/2009   Normal  . US ECHOCARDIOGRAPHY  12/15/2009   Normal study for age: mild MR,TR and trace PI    Current Outpatient Medications:  .  acetaminophen (TYLENOL) 500 MG tablet, Take 500 mg by mouth every 6 (six) hours as needed for mild pain or headache. , Disp: , Rfl:  .  Calcium Carbonate (CALTRATE 600 PO), Take by mouth 2 (two) times daily., Disp: , Rfl:  .  cetirizine (ZYRTEC) 5 MG tablet, Take 5 mg by mouth daily as needed for allergies., Disp: , Rfl:  .  cholecalciferol (VITAMIN D) 1000 UNITS tablet, Take 1,000 Units by mouth every morning. , Disp: , Rfl:  .  Coenzyme Q10 (CO Q 10 PO), Take by mouth. Liquid form, Disp: , Rfl:  .  Cyanocobalamin (VITAMIN B 12 PO), Take 1 tablet by mouth  daily., Disp: , Rfl:  .  ELDERBERRY PO, Take by mouth daily., Disp: , Rfl:  .  MILK THISTLE PO, Take 1 capsule by mouth every morning. , Disp: , Rfl:  .  Multiple Vitamin (MULTIVITAMIN) tablet, Take 1 tablet by mouth daily., Disp: , Rfl:  .  multivitamin-lutein (OCUVITE-LUTEIN) CAPS capsule, Take 1 capsule by mouth daily., Disp: , Rfl:  .  NONFORMULARY OR COMPOUNDED ITEM, Vitamin E vaginal suppositories 200u/ml.  One pv three times weekly., Disp: 36 each, Rfl: 3 .  rosuvastatin (CRESTOR) 5 MG tablet, Take 5 mg by mouth daily., Disp: , Rfl:  .  valACYclovir (VALTREX) 500 MG tablet, TAKE 4 TABLETS BY MOUTH EVERY 12 HOURS X 1 DAY FOR FEVER BLISTER AS NEEDED., Disp: 30 tablet, Rfl: 2 .  VITAMIN E PO, Take 1 capsule by mouth daily., Disp: , Rfl:   Allergies  Allergen Reactions  . Penicillins Other (See Comments)    UNKNOWN Childhood REACTION  . Sulfa Antibiotics Other (See Comments)    UNKNOWN Childhood REACTION   Review of Systems Objective:  There were no vitals filed for this visit.  General: Well developed, nourished, in no acute distress, alert and oriented x3   Dermatological: Skin is warm, dry and supple bilateral. Nails x 10 are well maintained; remaining integument appears unremarkable at this time. There are no  open sores, no preulcerative lesions, no rash or signs of infection present.  What appears to be a subungual possible onychomycosis or nail dystrophy along the lateral half of the nail plate right hallux.  Could be a nail dystrophy associated with impingement of the hallux of the second toe  Vascular: Dorsalis Pedis artery and Posterior Tibial artery pedal pulses are 2/4 bilateral with immedate capillary fill time. Pedal hair growth present. No varicosities and no lower extremity edema present bilateral.   Neruologic: Grossly intact via light touch bilateral. Vibratory intact via tuning fork bilateral. Protective threshold with Semmes Wienstein monofilament intact to all pedal  sites bilateral. Patellar and Achilles deep tendon reflexes 2+ bilateral. No Babinski or clonus noted bilateral.   Musculoskeletal: No gross boney pedal deformities bilateral. No pain, crepitus, or limitation noted with foot and ankle range of motion bilateral. Muscular strength 5/5 in all groups tested bilateral.  Hallux valgus deformity hammertoe deformities flexible in nature bilateral.  Gait: Unassisted, Nonantalgic.    Radiographs:  None taken  Assessment & Plan:   Assessment: Nail dystrophy fibular side hallux right.  Plan: Samples of the skin and nail were taken today for pathologic evaluation.     Jamarcus Laduke T. [place], [place]

## 2020-01-09 DIAGNOSIS — H524 Presbyopia: Secondary | ICD-10-CM | POA: Diagnosis not present

## 2020-01-16 DIAGNOSIS — M1811 Unilateral primary osteoarthritis of first carpometacarpal joint, right hand: Secondary | ICD-10-CM | POA: Diagnosis not present

## 2020-01-16 DIAGNOSIS — R52 Pain, unspecified: Secondary | ICD-10-CM | POA: Diagnosis not present

## 2020-01-19 ENCOUNTER — Telehealth: Payer: Self-pay | Admitting: *Deleted

## 2020-01-19 NOTE — Telephone Encounter (Signed)
Left message informing pt I was calling to give results.

## 2020-01-19 NOTE — Telephone Encounter (Signed)
Left message with female voice to have pt call for results.

## 2020-01-19 NOTE — Telephone Encounter (Signed)
Pt called for results.

## 2020-01-19 NOTE — Telephone Encounter (Signed)
-----   Message from Garrel Ridgel, [place] sent at 01/19/2020  8:48 AM EDT ----- Pathology negative for fungus-can send in Nuvail to help appearance of nail, cancel follow up.

## 2020-01-21 MED ORDER — NUVAIL EX SOLN: 1.0000 [drp] | Freq: Every day | CUTANEOUS | 11 refills | Status: DC

## 2020-01-21 NOTE — Telephone Encounter (Signed)
Pt called for results. I informed pt of Dr. Stephenie Acres review of results and Graylin Shiver is sent to pt's pharmacy.

## 2020-02-02 DIAGNOSIS — Z03818 Encounter for observation for suspected exposure to other biological agents ruled out: Secondary | ICD-10-CM | POA: Diagnosis not present

## 2020-02-02 DIAGNOSIS — Z20828 Contact with and (suspected) exposure to other viral communicable diseases: Secondary | ICD-10-CM | POA: Diagnosis not present

## 2020-02-03 DIAGNOSIS — R69 Illness, unspecified: Secondary | ICD-10-CM | POA: Diagnosis not present

## 2020-02-05 ENCOUNTER — Ambulatory Visit: Payer: Medicare HMO | Admitting: Podiatry

## 2020-02-10 ENCOUNTER — Other Ambulatory Visit: Payer: Self-pay | Admitting: *Deleted

## 2020-02-10 MED ORDER — NUVAIL EX SOLN: 1.0000 "application " | Freq: Every day | CUTANEOUS | 11 refills | Status: DC

## 2020-02-12 DIAGNOSIS — L821 Other seborrheic keratosis: Secondary | ICD-10-CM | POA: Diagnosis not present

## 2020-02-12 DIAGNOSIS — L820 Inflamed seborrheic keratosis: Secondary | ICD-10-CM | POA: Diagnosis not present

## 2020-02-12 DIAGNOSIS — L814 Other melanin hyperpigmentation: Secondary | ICD-10-CM | POA: Diagnosis not present

## 2020-02-12 DIAGNOSIS — L570 Actinic keratosis: Secondary | ICD-10-CM | POA: Diagnosis not present

## 2020-02-12 DIAGNOSIS — D1801 Hemangioma of skin and subcutaneous tissue: Secondary | ICD-10-CM | POA: Diagnosis not present

## 2020-02-12 DIAGNOSIS — D2262 Melanocytic nevi of left upper limb, including shoulder: Secondary | ICD-10-CM | POA: Diagnosis not present

## 2020-02-12 DIAGNOSIS — D2261 Melanocytic nevi of right upper limb, including shoulder: Secondary | ICD-10-CM | POA: Diagnosis not present

## 2020-02-12 DIAGNOSIS — D485 Neoplasm of uncertain behavior of skin: Secondary | ICD-10-CM | POA: Diagnosis not present

## 2020-02-12 DIAGNOSIS — D2272 Melanocytic nevi of left lower limb, including hip: Secondary | ICD-10-CM | POA: Diagnosis not present

## 2020-03-18 DIAGNOSIS — Z1231 Encounter for screening mammogram for malignant neoplasm of breast: Secondary | ICD-10-CM | POA: Diagnosis not present

## 2020-03-19 DIAGNOSIS — L0101 Non-bullous impetigo: Secondary | ICD-10-CM | POA: Diagnosis not present

## 2020-03-19 DIAGNOSIS — L905 Scar conditions and fibrosis of skin: Secondary | ICD-10-CM | POA: Diagnosis not present

## 2020-05-12 DIAGNOSIS — M65311 Trigger thumb, right thumb: Secondary | ICD-10-CM | POA: Diagnosis not present

## 2020-05-12 DIAGNOSIS — M65312 Trigger thumb, left thumb: Secondary | ICD-10-CM | POA: Diagnosis not present

## 2020-05-12 DIAGNOSIS — M1811 Unilateral primary osteoarthritis of first carpometacarpal joint, right hand: Secondary | ICD-10-CM | POA: Diagnosis not present

## 2020-05-12 DIAGNOSIS — M180 Bilateral primary osteoarthritis of first carpometacarpal joints: Secondary | ICD-10-CM | POA: Diagnosis not present

## 2020-06-18 DIAGNOSIS — R102 Pelvic and perineal pain: Secondary | ICD-10-CM | POA: Diagnosis not present

## 2020-06-18 DIAGNOSIS — N201 Calculus of ureter: Secondary | ICD-10-CM | POA: Diagnosis not present

## 2020-06-18 DIAGNOSIS — N132 Hydronephrosis with renal and ureteral calculous obstruction: Secondary | ICD-10-CM | POA: Diagnosis not present

## 2020-06-18 DIAGNOSIS — R1084 Generalized abdominal pain: Secondary | ICD-10-CM | POA: Diagnosis not present

## 2020-06-18 DIAGNOSIS — I700 Atherosclerosis of aorta: Secondary | ICD-10-CM | POA: Diagnosis not present

## 2020-06-22 DIAGNOSIS — H524 Presbyopia: Secondary | ICD-10-CM | POA: Diagnosis not present

## 2020-06-22 DIAGNOSIS — H5203 Hypermetropia, bilateral: Secondary | ICD-10-CM | POA: Diagnosis not present

## 2020-06-22 DIAGNOSIS — H52223 Regular astigmatism, bilateral: Secondary | ICD-10-CM | POA: Diagnosis not present

## 2020-06-22 DIAGNOSIS — H00024 Hordeolum internum left upper eyelid: Secondary | ICD-10-CM | POA: Diagnosis not present

## 2020-06-24 DIAGNOSIS — N201 Calculus of ureter: Secondary | ICD-10-CM | POA: Diagnosis not present

## 2020-06-24 DIAGNOSIS — R1084 Generalized abdominal pain: Secondary | ICD-10-CM | POA: Diagnosis not present

## 2020-06-25 NOTE — Progress Notes (Signed)
69 y.o. G3P2 Married White or Caucasian female here for breast and pelvic exam.  I am also following her for vaginal atrophic changes.  She has used vaginal Vit E twice weekly.  Not using this now.  Does have a lot of tenderness.  Not SA.  We discussed this in October, 2018.  She was not interested at that time in any estrogen products.  She is interested in discussion options today.  Reviewed vaginal products including estrogens, non hormonal options, OTC options.  She does have some family hx of CVD so estrogen risks specifically discussed.  Pt has tried OTC options without improvement.  Willing to consider vagifem at this time.  Dosing discussed.  Follow up discussed.    Denies vaginal bleeding.  She is having some issues with trigger fingers in her thumbs.  Seeing hand specialist.  Has diarrhea due to antibiotic she is taking for stye on her left eye.  Declines rectal exam today.  Patient's last menstrual period was 10/16/2001.  Post menopausal        Sexually active: No.  H/O STD:  no  Health Maintenance: PCP:  Crist Infante  Last wellness appt was virtual this year in Feb or March.  Did blood work at that appt: yes  Vaccines are up to date:  To check with pcp.  Colonoscopy:  08-18-14 polyp  f/u 29yrs, not done.  Pt is aware this is due.  She is going to wait until Covid is better. MMG:  03-18-2020 category a density birads 1:neg BMD:  2018 Last pap smear:  05-30-16 neg HPV HR neg, 05-15-2019 neg H/o abnormal pap smear:  yes   reports that she has never smoked. She has never used smokeless tobacco. She reports current alcohol use of about 7.0 standard drinks of alcohol per week. She reports that she does not use drugs.  Past Medical History:  Diagnosis Date  . Arthritis    RIGHT HIP  . Family history of early CAD   . Hyperlipidemia   . Kidney stone   . Osteopenia   . 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 Sigmoid diverticulosis   . Wears glasses     Past  Surgical History:  Procedure Laterality Date  . BREAST BIOPSY  12/ 2012   benign   . COLONOSCOPY  2010  . CYSTOSCOPY/RETROGRADE/URETEROSCOPY/STONE EXTRACTION WITH BASKET Left 04/21/2014   Procedure: cystoscopy, left retrograde pylegram, LEFT URETEROSCOPY, laser lithotripsy with holmium laser, STONE EXTRACTION, uretheral calibration;  Surgeon: Malka So, MD;  Location: [facility];  Service: Urology;  Laterality: Left;  . DILATION AND EVACUATION  1995  . EPISIOTOMY REPAIR, SEVERE TEAR  1989  . LAPAROSCOPIC OVARIAN CYSTECTOMY  10/ 2000  . NM MYOCAR PERF WALL MOTION  12/15/2009   Normal  . US ECHOCARDIOGRAPHY  12/15/2009   Normal study for age: mild MR,TR and trace PI    Current Outpatient Medications  Medication Sig Dispense Refill  . acetaminophen (TYLENOL) 500 MG tablet Take 500 mg by mouth every 6 (six) hours as needed for mild pain or headache.     . cephALEXin (KEFLEX) 500 MG capsule Take 500 mg by mouth 2 (two) times daily.    . cetirizine (ZYRTEC) 5 MG tablet Take 5 mg by mouth daily as needed for allergies.    . cholecalciferol (VITAMIN D) 1000 UNITS tablet Take 1,000 Units by mouth every morning.     . Coenzyme Q10 (CO Q 10 PO) Take by  mouth. Liquid form    . Cyanocobalamin (VITAMIN B 12 PO) Take 1 tablet by mouth daily.    [name] ELDERBERRY PO Take by mouth.     [name] MILK THISTLE PO Take 1 capsule by mouth every morning.     . Multiple Vitamin (MULTIVITAMIN) tablet Take 1 tablet by mouth.     . multivitamin-lutein (OCUVITE-LUTEIN) CAPS capsule Take 1 capsule by mouth.     . rosuvastatin (CRESTOR) 5 MG tablet Take 5 mg by mouth daily.    . tamsulosin (FLOMAX) 0.4 MG CAPS capsule Take 0.4 mg by mouth daily.    . valACYclovir (VALTREX) 500 MG tablet TAKE 4 TABLETS BY MOUTH EVERY 12 HOURS X 1 DAY FOR FEVER BLISTER AS NEEDED. 30 tablet 2  . VITAMIN E PO Take 1 capsule by mouth.     . Estradiol 10 MCG TABS vaginal tablet 1 tablet vaginally x 14 nights and then twice weekly. 34  tablet 4   No current facility-administered medications for this visit.    Family History  Problem Relation Age of Onset  . Diabetes Mother   . Congestive Heart Failure Mother   . Stroke Mother   . Osteoporosis Mother   . Other Mother        gallbladder ruptured  . Diabetes Brother   . Dementia Brother   . Diabetes Sister   . Colon cancer Brother        mets to liver  . Hypertension Sister        x2  . Heart attack Father   . Heart disease Brother        pacemaker  . Stroke Brother   . Thyroid disease Sister     Review of Systems  Constitutional: Negative.   HENT: Negative.   Eyes: Negative.   Respiratory: Negative.   Cardiovascular: Negative.   Gastrointestinal: Negative.   Endocrine: Negative.   Genitourinary: Negative.   Musculoskeletal: Negative.   Skin: Negative.   Allergic/Immunologic: Negative.   Neurological: Negative.   Hematological: Negative.   Psychiatric/Behavioral: Negative.     Exam:   BP 106/64   Pulse 68   Resp 16   Ht 5' 1.25" (1.556 m)   Wt 138 lb (62.6 kg)   LMP 10/16/2001   BMI 25.86 kg/m   Height: 5' 1.25" (155.6 cm)  General appearance: alert, cooperative and appears stated age Breasts: normal appearance, no masses or tenderness Abdomen: soft, non-tender; bowel sounds normal; no masses,  no organomegaly Lymph nodes: Cervical, supraclavicular, and axillary nodes normal.  No abnormal inguinal nodes palpated Neurologic: Grossly normal  Pelvic: External genitalia:  no lesions              Urethra:  normal appearing urethra with no masses, tenderness or lesions              Bartholins and Skenes: normal                 Vagina: significant atrophic changes              Cervix: no lesions              Pap taken: No. Bimanual Exam:  Uterus:  normal size, contour, position, consistency, mobility, non-tender              Adnexa: normal adnexa and no mass, fullness, tenderness               Rectovaginal: Confirms  Anus:   normal sphincter tone, no lesions  Chaperone, Terence Lux, CMA, was present for exam.  A:  Breast and Pelvic exam PMP, no HRT Osteopenia followed by Dr. Joylene Draft H/o oral HSV Significant vaginal atrophic changes  P:   Mammogram guidelines reviewed. pap smear not obtained.  Guidelines reviewed.  Last normal pap 04/2019. Valtrex rx not needed. Trial of vagifem 55meq pv x 14 nights and then pv twice weekly.  Rx to pharmacy.  Asked pt to give update in 3 months. Pt desires to return in one year  25 minutes spent with pt in discussion of options for treatment of vaginal atrophy, risks in additional to breast and pelvic exam.

## 2020-06-28 ENCOUNTER — Other Ambulatory Visit: Payer: Self-pay

## 2020-06-28 ENCOUNTER — Ambulatory Visit (INDEPENDENT_AMBULATORY_CARE_PROVIDER_SITE_OTHER): Payer: Medicare HMO | Admitting: Obstetrics & Gynecology

## 2020-06-28 ENCOUNTER — Encounter: Payer: Self-pay | Admitting: Obstetrics & Gynecology

## 2020-06-28 VITALS — BP 106/64 | HR 68 | Resp 16 | Ht 61.25 in | Wt 138.0 lb

## 2020-06-28 DIAGNOSIS — N952 Postmenopausal atrophic vaginitis: Secondary | ICD-10-CM | POA: Diagnosis not present

## 2020-06-28 DIAGNOSIS — Z01411 Encounter for gynecological examination (general) (routine) with abnormal findings: Secondary | ICD-10-CM | POA: Diagnosis not present

## 2020-06-28 DIAGNOSIS — B009 Herpesviral infection, unspecified: Secondary | ICD-10-CM

## 2020-06-28 MED ORDER — ESTRADIOL 10 MCG VA TABS: ORAL_TABLET | VAGINAL | 4 refills | Status: DC

## 2020-06-29 ENCOUNTER — Encounter: Payer: Self-pay | Admitting: Obstetrics & Gynecology

## 2020-06-29 DIAGNOSIS — B009 Herpesviral infection, unspecified: Secondary | ICD-10-CM | POA: Insufficient documentation

## 2020-07-01 DIAGNOSIS — R1084 Generalized abdominal pain: Secondary | ICD-10-CM | POA: Diagnosis not present

## 2020-07-01 DIAGNOSIS — R109 Unspecified abdominal pain: Secondary | ICD-10-CM | POA: Diagnosis not present

## 2020-07-24 DIAGNOSIS — Z23 Encounter for immunization: Secondary | ICD-10-CM | POA: Diagnosis not present

## 2020-07-27 ENCOUNTER — Ambulatory Visit: Payer: Medicare HMO | Admitting: Obstetrics & Gynecology

## 2020-08-28 ENCOUNTER — Ambulatory Visit: Payer: Medicare HMO

## 2020-08-28 ENCOUNTER — Emergency Department (HOSPITAL_COMMUNITY): Payer: Medicare HMO

## 2020-08-28 ENCOUNTER — Emergency Department (HOSPITAL_COMMUNITY)
Admission: EM | Admit: 2020-08-28 | Discharge: 2020-08-28 | Disposition: A | Discharge status: Home / Self Care | Payer: Medicare HMO | Attending: Emergency Medicine | Admitting: Emergency Medicine

## 2020-08-28 ENCOUNTER — Other Ambulatory Visit: Payer: Self-pay

## 2020-08-28 ENCOUNTER — Encounter (HOSPITAL_COMMUNITY): Payer: Self-pay | Admitting: Emergency Medicine

## 2020-08-28 DIAGNOSIS — R197 Diarrhea, unspecified: Secondary | ICD-10-CM | POA: Diagnosis not present

## 2020-08-28 DIAGNOSIS — I700 Atherosclerosis of aorta: Secondary | ICD-10-CM | POA: Diagnosis not present

## 2020-08-28 DIAGNOSIS — K625 Hemorrhage of anus and rectum: Secondary | ICD-10-CM | POA: Diagnosis not present

## 2020-08-28 DIAGNOSIS — K922 Gastrointestinal hemorrhage, unspecified: Secondary | ICD-10-CM

## 2020-08-28 DIAGNOSIS — R9431 Abnormal electrocardiogram [ECG] [EKG]: Secondary | ICD-10-CM | POA: Diagnosis not present

## 2020-08-28 DIAGNOSIS — R109 Unspecified abdominal pain: Secondary | ICD-10-CM | POA: Diagnosis not present

## 2020-08-28 DIAGNOSIS — K7689 Other specified diseases of liver: Secondary | ICD-10-CM | POA: Diagnosis not present

## 2020-08-28 LAB — COMPREHENSIVE METABOLIC PANEL
ALT: 19 U/L (ref 0–44)
AST: 24 U/L (ref 15–41)
Albumin: 4.2 g/dL (ref 3.5–5.0)
Alkaline Phosphatase: 70 U/L (ref 38–126)
Anion gap: 10 (ref 5–15)
BUN: 18 mg/dL (ref 8–23)
CO2: 24 mmol/L (ref 22–32)
Calcium: 9 mg/dL (ref 8.9–10.3)
Chloride: 109 mmol/L (ref 98–111)
Creatinine, Ser: 0.81 mg/dL (ref 0.44–1.00)
GFR, Estimated: >60 mL/min (ref >60)
Glucose, Bld: 112 mg/dL — ABNORMAL HIGH (ref 70–99)
Potassium: 3.7 mmol/L (ref 3.5–5.1)
Sodium: 143 mmol/L (ref 135–145)
Total Bilirubin: 1.3 mg/dL — ABNORMAL HIGH (ref 0.3–1.2)
Total Protein: 6 g/dL — ABNORMAL LOW (ref 6.5–8.1)

## 2020-08-28 LAB — POC OCCULT BLOOD, ED: Fecal Occult Bld: POSITIVE — AB

## 2020-08-28 LAB — CBC
HCT: 41.6 % (ref 36.0–46.0)
Hemoglobin: 13.6 g/dL (ref 12.0–15.0)
MCH: 34.9 pg — ABNORMAL HIGH (ref 26.0–34.0)
MCHC: 32.7 g/dL (ref 30.0–36.0)
MCV: 106.7 fL — ABNORMAL HIGH (ref 80.0–100.0)
Platelets: 184 10*3/uL (ref 150–400)
RBC: 3.9 MIL/uL (ref 3.87–5.11)
RDW: 13.2 % (ref 11.5–15.5)
WBC: 7.7 10*3/uL (ref 4.0–10.5)
nRBC: 0 % (ref 0.0–0.2)

## 2020-08-28 LAB — ABO/RH: ABO/RH(D): O POS

## 2020-08-28 LAB — TYPE AND SCREEN
ABO/RH(D): O POS
Antibody Screen: NEGATIVE

## 2020-08-28 MED ORDER — IOHEXOL 350 MG/ML SOLN
100.0000 mL | Freq: Once | INTRAVENOUS | Status: AC | PRN
  Administered 2020-08-28: 100 mL via INTRAVENOUS

## 2020-08-28 MED ORDER — SODIUM CHLORIDE 0.9 % IV BOLUS
500.0000 mL | Freq: Once | INTRAVENOUS | Status: AC
  Administered 2020-08-28: 500 mL via INTRAVENOUS

## 2020-08-28 NOTE — ED Provider Notes (Signed)
[facility] EMERGENCY DEPARTMENT Provider Note   CSN: [id number] Arrival date & time: 08/28/20  1158     History Chief Complaint  Patient presents with  . GI Bleeding   Brooke Whitaker is a 69 y.o. female with a hx of diverticulosis, hyperlipidemia, & nephrolithiasis who presents to the ED with complaints of blood per rectum that began this AM. Patient states throughout the night she had some crampy abdominal pain that worsened this morning and caused her to wake up around 8:30 with subsequent diarrhea. She had 2 episodes of typical diarrhea, but with the 3rd episode had bright red blood per rectum with minimal stool and by the 4th episode it was only blood per rectum. Abdominal discomfort resolved by ED arrival and has not returned. She did feel a bit lightheaded like she might pass out while on the commode prior to arrival- resolved at present. Denies fever, chills, vomiting, syncope, or melena. Recently on abx to tx an eye infection that she finished about 1 month ago- this this might have been doxycycline. No recent foreign travel. No hx of same. Does not take NSAIDs or anticoagulation. Drinks 1 glass of wine per night.   HPI     Past Medical History:  Diagnosis Date  . Arthritis    RIGHT HIP  . Family history of early CAD   . Hyperlipidemia   . Kidney stone   . Osteopenia   . 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 Sigmoid diverticulosis   . Wears glasses     Patient Active Problem List   Diagnosis Date Noted  . 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 Trigger finger of left thumb 04/16/2019  . Left ureteral stone 04/21/2014  . Kidney stones   . Hyperlipidemia 08/04/2013    Past Surgical History:  Procedure Laterality Date  . BREAST BIOPSY  12/ 2012   benign   . COLONOSCOPY  2010  . CYSTOSCOPY/RETROGRADE/URETEROSCOPY/STONE EXTRACTION WITH BASKET Left 04/21/2014   Procedure: cystoscopy, left retrograde  pylegram, LEFT URETEROSCOPY, laser lithotripsy with holmium laser, STONE EXTRACTION, uretheral calibration;  Surgeon: Malka So, MD;  Location: [facility];  Service: Urology;  Laterality: Left;  . DILATION AND EVACUATION  1995  . EPISIOTOMY REPAIR, SEVERE TEAR  1989  . LAPAROSCOPIC OVARIAN CYSTECTOMY  10/ 2000  . NM MYOCAR PERF WALL MOTION  12/15/2009   Normal  . US ECHOCARDIOGRAPHY  12/15/2009   Normal study for age: mild MR,TR and trace PI     OB History    Gravida  3   Para  2   Term      Preterm      AB      Living  2     SAB      TAB      Ectopic      Multiple      Live Births              Family History  Problem Relation Age of Onset  . Diabetes Mother   . Congestive Heart Failure Mother   . Stroke Mother   . Osteoporosis Mother   . Other Mother        gallbladder ruptured  . Diabetes Brother   . Dementia Brother   . Diabetes Sister   . Colon cancer Brother        mets to liver  . Hypertension Sister  x2  . Heart attack Father   . Heart disease Brother        pacemaker  . Stroke Brother   . Thyroid disease Sister     Social History   Tobacco Use  . Smoking status: Never Smoker  . Smokeless tobacco: Never Used  Vaping Use  . Vaping Use: Never used  Substance Use Topics  . Alcohol use: Yes    Alcohol/week: 7.0 standard drinks    Types: 7 Glasses of wine per week  . Drug use: No    Home Medications Prior to Admission medications   Medication Sig Start Date End Date Taking? Authorizing Provider  acetaminophen (TYLENOL) 500 MG tablet Take 500 mg by mouth every 6 (six) hours as needed for mild pain or headache.     [provider]  cephALEXin (KEFLEX) 500 MG capsule Take 500 mg by mouth 2 (two) times daily. 06/20/20   [provider]  cetirizine (ZYRTEC) 5 MG tablet Take 5 mg by mouth daily as needed for allergies.    [provider]  cholecalciferol (VITAMIN D) 1000 UNITS tablet Take 1,000  Units by mouth every morning.     [provider]  Coenzyme Q10 (CO Q 10 PO) Take by mouth. Liquid form    [provider]  Cyanocobalamin (VITAMIN B 12 PO) Take 1 tablet by mouth daily.    [provider]  ELDERBERRY PO Take by mouth.     [provider]  Estradiol 10 MCG TABS vaginal tablet 1 tablet vaginally x 14 nights and then twice weekly. 06/28/20   Megan Salon, MD  MILK THISTLE PO Take 1 capsule by mouth every morning.     [provider]  Multiple Vitamin (MULTIVITAMIN) tablet Take 1 tablet by mouth.     [provider]  multivitamin-lutein (OCUVITE-LUTEIN) CAPS capsule Take 1 capsule by mouth.     [provider]  rosuvastatin (CRESTOR) 5 MG tablet Take 5 mg by mouth daily. 04/24/19   [provider]  tamsulosin (FLOMAX) 0.4 MG CAPS capsule Take 0.4 mg by mouth daily. 06/18/20   [provider]  valACYclovir (VALTREX) 500 MG tablet TAKE 4 TABLETS BY MOUTH EVERY 12 HOURS X 1 DAY FOR FEVER BLISTER AS NEEDED. 05/15/19   Megan Salon, MD  VITAMIN E PO Take 1 capsule by mouth.     [provider]    Allergies    Penicillins and Sulfa antibiotics  Review of Systems   Review of Systems  Constitutional: Negative for chills and fever.  Respiratory: Negative for shortness of breath.   Cardiovascular: Negative for chest pain.  Gastrointestinal: Positive for abdominal pain (resolved at present), blood in stool and diarrhea. Negative for constipation and vomiting.  Genitourinary: Negative for dysuria.  Neurological: Positive for light-headedness. Negative for syncope.  All other systems reviewed and are negative.   Physical Exam Updated Vital Signs BP (!) 114/48 (BP Location: Right Arm)   Pulse 66   Temp 97.7 F (36.5 C) (Oral)   Resp 20   Ht 5\' 1"  (1.549 m)   Wt 62.6 kg   LMP 10/16/2001   SpO2 100%   BMI 26.07 kg/m   Physical Exam Vitals and nursing note reviewed.  Constitutional:       General: She is not in acute distress.    Appearance: She is well-developed. She is not toxic-appearing.  HENT:     Head: Normocephalic and atraumatic.  Eyes:  General:        Right eye: No discharge.        Left eye: No discharge.     Conjunctiva/sclera: Conjunctivae normal.  Cardiovascular:     Rate and Rhythm: Normal rate and regular rhythm.  Pulmonary:     Effort: Pulmonary effort is normal. No respiratory distress.     Breath sounds: Normal breath sounds. No wheezing, rhonchi or rales.  Abdominal:     General: There is no distension.     Palpations: Abdomen is soft.     Tenderness: There is no abdominal tenderness. There is no guarding or rebound.  Genitourinary:    Comments: Chaperone present.  No external hemorrhoids or fissures.  DRE with small amount of blood removed from rectum. Fairly bright red with clotting some. No melena.  Musculoskeletal:     Cervical back: Neck supple.  Skin:    General: Skin is warm and dry.     Findings: No rash.  Neurological:     Mental Status: She is alert.     Comments: Clear speech.   Psychiatric:        Behavior: Behavior normal.     ED Results / Procedures / Treatments   Labs (all labs ordered are listed, but only abnormal results are displayed) Labs Reviewed  CBC - Abnormal; Notable for the following components:      Result Value   MCV 106.7 (*)    MCH 34.9 (*)    All other components within normal limits  COMPREHENSIVE METABOLIC PANEL  POC OCCULT BLOOD, ED  TYPE AND SCREEN  ABO/RH    EKG None  Radiology No results found.  Procedures Procedures (including critical care time)  Medications Ordered in ED Medications - No data to display  ED Course  I have reviewed the triage vital signs and the nursing notes.  Pertinent labs & imaging results that were available during my care of the patient were reviewed by me and considered in my medical decision making (see chart for details).    MDM  Rules/Calculators/A&P                         Patient presents to the ED with complaints of diarrhea with bright red blood per rectum. Abdominal pain resolved currently. Near syncope episode on the commode. Nontoxic, vitals without significant abnormality. Abdomen w/o focal tenderness or peritoneal signs. DRE with bright red small blood clot. No melena.   Additional history obtained:  Additional history obtained from chart review & nursing note review.   Colonoscopy 08/18/2014:  Polyp in the in the proximal rectum.  Impression: Rectal polyp- cold biopsy excision.  Recommendations: 1. Postpolypectomy instructions reviewed. 2. Follow up pathology. 3. Repeat colonoscopy in 5 years because of family history of brother with colon cancer.   Lab Tests:  I reviewed & interpreted labs, which included:  CBC: hgb/hct are currently WNL.  CMP: Mild t bili elevation. No significant electrolyte derangement. Renal function WNL.  Fecal occult testing: Grossly positive.   13:37: CONSULT: Discussed with gastroenterologist Dr. Therisa Doyne- in agreement with CTA abdomen/pelvis, call back after results.   Patient evaluated by supervising physician Dr. Ronnald Nian who has assumed care of patient @ change of shift pending CTA & disposition.   Portions of this note were generated with Lobbyist. Dictation errors may occur despite best attempts at proofreading.  Final Clinical Impression(s) / ED Diagnoses Final diagnoses:  None    Rx / DC  Orders ED Discharge Orders    None       Amaryllis Dyke, PA-C 08/28/20 Pioneer, Adam, DO 08/28/20 1806

## 2020-08-28 NOTE — ED Triage Notes (Signed)
Pt reports abd cramping and 2 episodes of diarrhea this morning.  Reports bright red rectal bleeding x 45 min.  Denies abd pain at present.

## 2020-08-28 NOTE — ED Provider Notes (Signed)
Medical screening examination/treatment/procedure(s) were conducted as a shared visit with non-physician practitioner(s) and myself.  I personally evaluated the patient during the encounter. Briefly, the patient is a 69 y.o. female with history of high cholesterol who presents to the ED with rectal bleeding, abdominal pain.  Normal vitals.  No fever.  Several episodes of bright red blood per rectum just prior to arrival.  Has been having some crampy lower abdominal pain.  Has had a polyp before on colonoscopy years ago but no known history of diverticulitis or other intra-abdominal disease.  Overall appears well feels well now.  Not on blood thinners.  Hemoglobin is 13.6.  Otherwise lab work is unremarkable.  We will get a CT angio of abdomen and pelvis which GI agrees with and will reengage them after imaging is done.  Thus far patient has not had any other episodes of bleeding.  CT angios unremarkable.  Lab work unremarkable.  No further episodes of bleeding.  Talked with GI again on the phone and patient appears hemodynamically stable with no further events is okay for discharge and follow-up with her GI doctor.  Patient understands return precautions.  Suspect likely a bleed from a hemorrhoid or polyp.  This chart was dictated using voice recognition software.  Despite best efforts to proofread,  errors can occur which can change the documentation meaning.     EKG Interpretation  Date/Time:  Saturday August 28 2020 13:27:28 EST Ventricular Rate:  63 PR Interval:    QRS Duration: 91 QT Interval:  415 QTC Calculation: 425 R Axis:   50 Text Interpretation: Sinus rhythm Low voltage, precordial leads Confirmed by Lennice Sites [phone]) on 08/28/2020 1:51:30 PM           Lennice Sites, DO 08/28/20 1755

## 2020-08-28 NOTE — ED Notes (Signed)
Patient transported to CT 

## 2020-09-27 DIAGNOSIS — Z1211 Encounter for screening for malignant neoplasm of colon: Secondary | ICD-10-CM | POA: Diagnosis not present

## 2020-09-27 DIAGNOSIS — Z800 Family history of malignant neoplasm of digestive organs: Secondary | ICD-10-CM | POA: Diagnosis not present

## 2020-09-27 DIAGNOSIS — K922 Gastrointestinal hemorrhage, unspecified: Secondary | ICD-10-CM | POA: Diagnosis not present

## 2020-09-27 DIAGNOSIS — K6389 Other specified diseases of intestine: Secondary | ICD-10-CM | POA: Diagnosis not present

## 2020-09-27 DIAGNOSIS — K529 Noninfective gastroenteritis and colitis, unspecified: Secondary | ICD-10-CM | POA: Diagnosis not present

## 2020-11-11 DIAGNOSIS — H52223 Regular astigmatism, bilateral: Secondary | ICD-10-CM | POA: Diagnosis not present

## 2020-11-11 DIAGNOSIS — H11822 Conjunctivochalasis, left eye: Secondary | ICD-10-CM | POA: Diagnosis not present

## 2020-11-11 DIAGNOSIS — H5203 Hypermetropia, bilateral: Secondary | ICD-10-CM | POA: Diagnosis not present

## 2020-11-11 DIAGNOSIS — H524 Presbyopia: Secondary | ICD-10-CM | POA: Diagnosis not present

## 2020-11-12 DIAGNOSIS — H0015 Chalazion left lower eyelid: Secondary | ICD-10-CM | POA: Diagnosis not present

## 2020-11-12 DIAGNOSIS — H0014 Chalazion left upper eyelid: Secondary | ICD-10-CM | POA: Diagnosis not present

## 2020-11-26 DIAGNOSIS — E559 Vitamin D deficiency, unspecified: Secondary | ICD-10-CM | POA: Diagnosis not present

## 2020-11-26 DIAGNOSIS — E785 Hyperlipidemia, unspecified: Secondary | ICD-10-CM | POA: Diagnosis not present

## 2020-12-03 DIAGNOSIS — Z23 Encounter for immunization: Secondary | ICD-10-CM | POA: Diagnosis not present

## 2020-12-03 DIAGNOSIS — M8580 Other specified disorders of bone density and structure, unspecified site: Secondary | ICD-10-CM | POA: Diagnosis not present

## 2020-12-03 DIAGNOSIS — E538 Deficiency of other specified B group vitamins: Secondary | ICD-10-CM | POA: Diagnosis not present

## 2020-12-03 DIAGNOSIS — Z0000 Encounter for general adult medical examination without abnormal findings: Secondary | ICD-10-CM | POA: Diagnosis not present

## 2020-12-03 DIAGNOSIS — R0683 Snoring: Secondary | ICD-10-CM | POA: Diagnosis not present

## 2020-12-03 DIAGNOSIS — R82998 Other abnormal findings in urine: Secondary | ICD-10-CM | POA: Diagnosis not present

## 2020-12-03 DIAGNOSIS — M1990 Unspecified osteoarthritis, unspecified site: Secondary | ICD-10-CM | POA: Diagnosis not present

## 2020-12-03 DIAGNOSIS — E785 Hyperlipidemia, unspecified: Secondary | ICD-10-CM | POA: Diagnosis not present

## 2020-12-03 DIAGNOSIS — J302 Other seasonal allergic rhinitis: Secondary | ICD-10-CM | POA: Diagnosis not present

## 2020-12-03 DIAGNOSIS — Z780 Asymptomatic menopausal state: Secondary | ICD-10-CM | POA: Diagnosis not present

## 2020-12-07 ENCOUNTER — Telehealth: Payer: Self-pay | Admitting: Gastroenterology

## 2020-12-07 NOTE — Telephone Encounter (Signed)
Pt called looking to transfer GI care to Dr. Silverio Decamp because her current GI physician Dr. Earlean Shawl is retiring. Pt has been dealing with episodes of colitis. She would have her records faxed over for review.

## 2020-12-08 ENCOUNTER — Other Ambulatory Visit: Payer: Self-pay | Admitting: Internal Medicine

## 2020-12-08 DIAGNOSIS — E785 Hyperlipidemia, unspecified: Secondary | ICD-10-CM

## 2020-12-16 NOTE — Telephone Encounter (Signed)
Received records, will send to Dr Silverio Decamp to review

## 2020-12-16 NOTE — Telephone Encounter (Signed)
Ok to schedule next available appointment. Thanks  

## 2020-12-17 DIAGNOSIS — E785 Hyperlipidemia, unspecified: Secondary | ICD-10-CM | POA: Diagnosis not present

## 2020-12-20 DIAGNOSIS — Z780 Asymptomatic menopausal state: Secondary | ICD-10-CM | POA: Diagnosis not present

## 2020-12-20 DIAGNOSIS — M8589 Other specified disorders of bone density and structure, multiple sites: Secondary | ICD-10-CM | POA: Diagnosis not present

## 2020-12-22 ENCOUNTER — Other Ambulatory Visit: Payer: Self-pay | Admitting: Internal Medicine

## 2020-12-24 ENCOUNTER — Ambulatory Visit
Admission: RE | Admit: 2020-12-24 | Discharge: 2020-12-24 | Disposition: A | Discharge status: Home / Self Care | Payer: Medicare HMO | Source: Ambulatory Visit | Attending: Internal Medicine | Admitting: Internal Medicine

## 2020-12-24 DIAGNOSIS — E785 Hyperlipidemia, unspecified: Secondary | ICD-10-CM

## 2021-01-04 ENCOUNTER — Institutional Professional Consult (permissible substitution): Payer: Medicare HMO | Admitting: Neurology

## 2021-01-10 ENCOUNTER — Ambulatory Visit
Admission: RE | Admit: 2021-01-10 | Discharge: 2021-01-10 | Disposition: A | Discharge status: Home / Self Care | Payer: Medicare HMO | Source: Ambulatory Visit | Attending: Internal Medicine | Admitting: Internal Medicine

## 2021-01-12 DIAGNOSIS — H5203 Hypermetropia, bilateral: Secondary | ICD-10-CM | POA: Diagnosis not present

## 2021-01-17 ENCOUNTER — Encounter: Payer: Self-pay | Admitting: Neurology

## 2021-01-17 ENCOUNTER — Other Ambulatory Visit: Payer: Self-pay

## 2021-01-17 ENCOUNTER — Ambulatory Visit: Payer: Medicare HMO | Admitting: Neurology

## 2021-01-17 VITALS — BP 120/66 | HR 70 | Ht 61.0 in | Wt 132.0 lb

## 2021-01-17 DIAGNOSIS — R0683 Snoring: Secondary | ICD-10-CM | POA: Diagnosis not present

## 2021-01-17 DIAGNOSIS — M2619 Other specified anomalies of jaw-cranial base relationship: Secondary | ICD-10-CM | POA: Diagnosis not present

## 2021-01-17 NOTE — Progress Notes (Signed)
SLEEP MEDICINE CLINIC    Provider:  Larey Seat, MD  Primary Care Physician:  Crist Infante, MD [place] [address]     Referring Provider: Crist Infante, [address] [place],  [place] [address]          Chief Complaint according to patient   Patient presents with:    . New Patient (Initial Visit)           HISTORY OF PRESENT ILLNESS:  Brooke Whitaker is a 70 - year old Caucasian female patient and is seen here upon a referral from dr Joylene Draft on 01/17/2021 for a sleep consultation.  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    Sleep relevant medical history: Nocturia times 2, no ENT or cervical spine surgery. No TBI. Elevated bilirubin, GI bleeds.     Family medical /sleep history: No other family member on CPAP with OSA, insomnia, sleep walkers.    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  Adult children. Tobacco use; never   ETOH use ; wine daily on 5 out of 7 days, increased since the pandemic,  Caffeine intake in form of Coffee(  1 cup in AM ). Regular exercise in form of walking most days. She has a 57 year old grandson.     Sleep habits are as follows: The patient's dinner time is between 8.30 PM with husband  Brooke Whitaker. The patient goes to bed at 11 and reads- and will be asleep in 30 minutes- and she sleeps through until the morning.  She wakes for 2 bathroom breaks, the first time at 2 AM and at 6 AM.   The preferred sleep position is dictated by hip arthritis, with the support of 3 pillows.  Dreams are reportedly infrequent.[name]  9.00 AM is the usual rise time. The patient wakes up  spontaneously.  She reports she is feeling refreshed / restored in AM, with some times symptoms such as dry mouth ( water next to bed) , morning headaches , and residual fatigue. Naps are taken rarely- 30 minutes.    Review of Systems: Out of a complete 14 system review, the patient complains of only the following symptoms, and all other reviewed systems are negative.:  Fatigue, not much daytime sleepiness ,  snoring per husband.    How likely are you to doze in the following situations: 0 = not likely, 1 = slight chance, 2 = moderate chance, 3 = high chance   Sitting and Reading? Watching Television? Sitting inactive in a public place (theater or meeting)? As a passenger in a car for an hour without a break? Lying down in the afternoon when circumstances permit? Sitting and talking to someone? Sitting quietly after lunch without alcohol? In a car, while stopped for a few minutes in traffic?   Total = 2/ 24 points   FSS endorsed at 13/ 63 points.   No RLS and no GERD.   Social History   Socioeconomic History  . Marital status: Married    Spouse name: Not on file  . Number of children: Not on file  . Years  of education: Not on file  . Highest education level: Not on file  Occupational History  . Not on file  Tobacco Use  . Smoking status: Never Smoker  . Smokeless tobacco: Never Used  Vaping Use  . Vaping Use: Never used  Substance and Sexual Activity  . Alcohol use: Yes    Alcohol/week: 7.0 standard drinks    Types: 7 Glasses of wine per week  . Drug use: No  . Sexual activity: Not Currently    Partners: Male  Other Topics Concern  . Not on file  Social History Narrative  . Not on file   Social Determinants of Health   Financial Resource Strain: Not on file  Food Insecurity: Not on file  Transportation Needs: Not on file  Physical Activity: Not on file  Stress: Not on file  Social Connections: Not on file    Family History  Problem Relation Age of Onset   . Diabetes Mother   . Congestive Heart Failure Mother   . Stroke Mother   . Osteoporosis Mother   . Other Mother        gallbladder ruptured  . Diabetes Brother   . Dementia Brother   . Diabetes Sister   . Colon cancer Brother        mets to liver  . Hypertension Sister        x2  . Heart attack Father   . Heart disease Brother        pacemaker  . Stroke Brother   . Thyroid disease Sister     Past Medical History:  Diagnosis Date  . Arthritis    RIGHT HIP  . Family history of early CAD   . Hyperlipidemia   . Kidney stone   . Osteopenia   . 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 Sigmoid diverticulosis   . Wears glasses     Past Surgical History:  Procedure Laterality Date  . BREAST BIOPSY  12/ 2012   benign   . COLONOSCOPY  2010  . CYSTOSCOPY/RETROGRADE/URETEROSCOPY/STONE EXTRACTION WITH BASKET Left 04/21/2014   Procedure: cystoscopy, left retrograde pylegram, LEFT URETEROSCOPY, laser lithotripsy with holmium laser, STONE EXTRACTION, uretheral calibration;  Surgeon: Malka So, MD;  Location: [facility];  Service: Urology;  Laterality: Left;  . DILATION AND EVACUATION  1995  . EPISIOTOMY REPAIR, SEVERE TEAR  1989  . LAPAROSCOPIC OVARIAN CYSTECTOMY  10/ 2000  . NM MYOCAR PERF WALL MOTION  12/15/2009   Normal  . US ECHOCARDIOGRAPHY  12/15/2009   Normal study for age: mild MR,TR and trace PI     Current Outpatient Medications on File Prior to Visit  Medication Sig Dispense Refill  . acetaminophen (TYLENOL) 500 MG tablet Take 500 mg by mouth every 6 (six) hours as needed for mild pain or headache.     . cetirizine (ZYRTEC) 5 MG tablet Take 5 mg by mouth daily as needed for allergies.    . cholecalciferol (VITAMIN D) 1000 UNITS tablet Take 1,000 Units by mouth every morning.     . Cyanocobalamin (VITAMIN B 12 PO) Take 1,000 mcg by mouth daily.    [name] ELDERBERRY PO Take 1 tablet by mouth daily.     . rosuvastatin (CRESTOR) 5 MG  tablet Take 5 mg by mouth daily.    . valACYclovir (VALTREX) 500 MG tablet TAKE 4 TABLETS BY MOUTH EVERY 12 HOURS X 1 DAY FOR FEVER BLISTER AS NEEDED. (Patient taking differently: Take 2,000  mg by mouth every 12 (twelve) hours. X 1 DAY FOR FEVER BLISTER AS NEEDED.) 30 tablet 2  . Turmeric 500 MG CAPS Take 2 capsules by mouth daily.     No current facility-administered medications on file prior to visit.    Allergies  Allergen Reactions  . Penicillins Other (See Comments)    UNKNOWN Childhood REACTION  . Sulfa Antibiotics Other (See Comments)    UNKNOWN Childhood REACTION    Physical exam:  Today's Vitals   01/17/21 1449  BP: 120/66  Pulse: 70  Weight: 132 lb (59.9 kg)  Height: 5\' 1"  (1.549 m)   Body mass index is 24.94 kg/m.    GI bleed ; IMPRESSION: 1. Unremarkable CTA of the abdomen and pelvis. No evidence of bleeding into the gastrointestinal tract on arterial or venous phases of imaging. 2. Probable hemangioma in the dome of the right lobe of the liver measuring just over 2 cm in maximum diameter. This corresponds to an area of increased echogenicity by prior ultrasound in 2010 and is favored to represent a hemangioma.  Aortic Atherosclerosis (ICD10-I70.0).   Electronically Signed   By: Aletta Edouard M.D.   On: 08/28/2020 14:59   Liver:  There is a septated cyst anteriorly in the dome of the right hepatic lobe which measures 2.9 x 2.2 x 3.0 cm. There is an echogenic subcapsular lesion posteriorly in the right lobe which measures 4.3 x 2.6 x 1.5 cm. This is likely a hemangioma based on recent CT, although does not clearly the same lesion seen by remote ultrasound of 2010. The portal vein is patent on color Doppler imaging with normal direction of blood flow towards the liver.  Other: None.  IMPRESSION: 1. No acute findings or explanation for the patient's symptoms. 2. 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   Electronically Signed   By: Richardean Sale M.D.  Wt Readings from Last 3 Encounters:  01/17/21 132 lb (59.9 kg)  08/28/20 138 lb (62.6 kg)  06/28/20 138 lb (62.6 kg)     Ht Readings from Last 3 Encounters:  01/17/21 5\' 1"  (1.549 m)  08/28/20 5\' 1"  (1.549 m)  06/28/20 5' 1.25" (1.556 m)      General: The patient is awake, alert and appears not in acute distress. The patient is well groomed. Head: Normocephalic, atraumatic. Neck is supple. 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 ,  neck circumference:13 inches . Nasal airflow patent.  Retrognathia is  seen.  Dental status: biological and bridges.  Cardiovascular:  Regular rate and cardiac rhythm by pulse,  without distended neck veins. Respiratory: Lungs are clear to auscultation.  Skin:  Without evidence of ankle edema, or rash. Trunk: The patient's posture is erect.   Neurologic exam : The patient is awake and alert, oriented to place and time.   Memory subjective described as intact.  Attention span & concentration ability appears normal.  Speech is fluent, without  dysarthria, dysphonia or aphasia.  Mood and affect are appropriate.   Cranial nerves: no loss of smell or taste reported -  Pupils are equal and briskly reactive to light. Funduscopic exam deferred. Early cataract in the left eye.   Extraocular movements in vertical and horizontal planes were intact -without nystagmus. No Diplopia. Visual fields by finger perimetry are intact. Hearing was intact to soft voice and finger rubbing.    Facial sensation intact to fine touch.  Facial motor strength is symmetric and tongue and uvula  move midline.  Neck ROM : rotation, tilt and flexion extension were normal for age and shoulder shrug was symmetrical.    Motor exam:  Symmetric bulk, tone and ROM.   Normal tone without cog wheeling, symmetric grip strength .   Sensory:  Fine touch, pinprick and vibration were tested  and  normal.   Proprioception tested in the upper extremities was normal.   Coordination: Rapid alternating movements in the fingers/hands were of normal speed.  The Finger-to-nose maneuver was intact without evidence of ataxia, dysmetria or tremor.  Gait and station: Patient could rise unassisted from a seated position.  Stance is of normal width/ base and the patient turned with 3 steps.  Toe and heel walk were deferred.  Deep tendon reflexes: in the  upper and lower extremities are symmetric and intact.  Babinski response was deferred .      After spending a total time of 32  minutes face to face and additional time for physical and neurologic examination, review of laboratory studies,  personal review of imaging studies, reports and results of other testing and review of referral information / records as far as provided in visit, I have established the following assessments:  1)  Snoring reported , witnessed by spouse. Described as gurgling, but she has not been woken by it.  Bruxism, retrognathia noted. Strong midface.  2)  Rare morning headches 3)  usuaslly reports restorative sleep.    My Plan is to proceed with:  1) HST to rule out OSA versus UARS. Could likely use a dental device.   I would like to thank Crist Infante, MD and Crist Infante, [place],  [place] [address] for allowing me to meet with and to take care of this pleasant patient.   In short, Brooke Whitaker is presenting with snoring , a symptom that can be attributed to UARS.   I plan to follow up either personally or through our NP within 2-4 months.     Electronically signed by: Larey Seat, MD 01/17/2021 2:58 PM  [facility] Board certified by The [employer] of Sleep Medicine and Diplomate of the [employer] of Sleep Medicine. Board certified In Neurology through the [employer], Fellow of the [employer] of Neurology. Medical Director of [employer].

## 2021-01-17 NOTE — Patient Instructions (Signed)
Screening for Sleep Apnea  Sleep apnea is a condition in which breathing pauses or becomes shallow during sleep. Sleep apnea screening is a test to determine if you are at risk for sleep apnea. The test is easy and only takes a few minutes. Your health care provider may ask you to have this test in preparation for surgery or as part of a physical exam. What are the symptoms of sleep apnea? Common symptoms of sleep apnea include:  Snoring.  Restless sleep.  Daytime sleepiness.  Pauses in breathing.  Choking during sleep.  Irritability.  Forgetfulness.  Trouble thinking clearly.  Depression.  Personality changes. Most people with sleep apnea are not aware that they have it. Why should I get screened? Getting screened for sleep apnea can help:  Ensure your safety. It is important for your health care providers to know whether or not you have sleep apnea, especially if you are having surgery or have other long-term (chronic) health conditions.  Improve your health and allow you to get a better night's rest. Restful sleep can help you: ? Have more energy. ? Lose weight. ? Improve high blood pressure. ? Improve diabetes management. ? Prevent stroke. ? Prevent car accidents. How is screening done? Screening usually includes being asked a list of questions about your sleep quality. Some questions you may be asked include:  Do you snore?  Is your sleep restless?  Do you have daytime sleepiness?  Has a partner or spouse told you that you stop breathing during sleep?  Have you had trouble concentrating or memory loss? If your screening test is positive, you are at risk for the condition. Further testing may be needed to confirm a diagnosis of sleep apnea. Where to find more information You can find screening tools online or at your health care clinic. For more information about sleep apnea screening and healthy sleep, visit these websites:  Centers for Disease Control and  Prevention: LearningDermatology.pl  American Sleep Apnea Association: www.sleepapnea.org Contact a health care provider if:  You think that you may have sleep apnea. Summary  Sleep apnea screening can help determine if you are at risk for sleep apnea.  It is important for your health care providers to know whether or not you have sleep apnea, especially if you are having surgery or have other chronic health conditions.  You may be asked to take a screening test for sleep apnea in preparation for surgery or as part of a physical exam. This information is not intended to replace advice given to you by your health care provider. Make sure you discuss any questions you have with your health care provider. Document Revised: 07/19/2018 Document Reviewed: 01/12/2017 Elsevier Patient Education  2021 [place] Sleep Information, Adult Quality sleep is important for your mental and physical health. It also improves your quality of life. Quality sleep means you:  Are asleep for most of the time you are in bed.  Fall asleep within 30 minutes.  Wake up no more than once a night.  Are awake for no longer than 20 minutes if you do wake up during the night. Most adults need 7-8 hours of quality sleep each night. How can poor sleep affect me? If you do not get enough quality sleep, you may have:  Mood swings.  Daytime sleepiness.  Confusion.  Decreased reaction time.  Sleep disorders, such as insomnia and sleep apnea.  Difficulty with: ? Solving problems. ? Coping with stress. ? Paying attention. These issues may  affect your performance and productivity at work, school, and at home. Lack of sleep may also put you at higher risk for accidents, suicide, and risky behaviors. If you do not get quality sleep you may also be at higher risk for several health problems, including:  Infections.  Type 2 diabetes.  Heart disease.  High blood  pressure.  Obesity.  Worsening of long-term conditions, like arthritis, kidney disease, depression, Parkinson's disease, and epilepsy. What actions can I take to get more quality sleep?  Stick to a sleep schedule. Go to sleep and wake up at about the same time each day. Do not try to sleep less on weekdays and make up for lost sleep on weekends. This does not work.  Try to get about 30 minutes of exercise on most days. Do not exercise 2-3 hours before going to bed.  Limit naps during the day to 30 minutes or less.  Do not use any products that contain nicotine or tobacco, such as cigarettes or e-cigarettes. If you need help quitting, ask your health care provider.  Do not drink caffeinated beverages for at least 8 hours before going to bed. Coffee, tea, and some sodas contain caffeine.  Do not drink alcohol close to bedtime.  Do not eat large meals close to bedtime.  Do not take naps in the late afternoon.  Try to get at least 30 minutes of sunlight every day. Morning sunlight is best.  Make time to relax before bed. Reading, listening to music, or taking a hot bath promotes quality sleep.  Make your bedroom a place that promotes quality sleep. Keep your bedroom dark, quiet, and at a comfortable room temperature. Make sure your bed is comfortable. Take out sleep distractions like TV, a computer, smartphone, and bright lights.  If you are lying awake in bed for longer than 20 minutes, get up and do a relaxing activity until you feel sleepy.  Work with your health care provider to treat medical conditions that may affect sleeping, such as: ? Nasal obstruction. ? Snoring. ? Sleep apnea and other sleep disorders.  Talk to your health care provider if you think any of your prescription medicines may cause you to have difficulty falling or staying asleep.  If you have sleep problems, talk with a sleep consultant. If you think you have a sleep disorder, talk with your health care  provider about getting evaluated by a specialist.      Where to find more information  [place] website: https://sleepfoundation.org  National Heart, Lung, and [place]): http://www.saunders.info/.pdf  Centers for Disease Control and Prevention (CDC): LearningDermatology.pl Contact a health care provider if you:  Have trouble getting to sleep or staying asleep.  Often wake up very early in the morning and cannot get back to sleep.  Have daytime sleepiness.  Have daytime sleep attacks of suddenly falling asleep and sudden muscle weakness (narcolepsy).  Have a tingling sensation in your legs with a strong urge to move your legs (restless legs syndrome).  Stop breathing briefly during sleep (sleep apnea).  Think you have a sleep disorder or are taking a medicine that is affecting your quality of sleep. Summary  Most adults need 7-8 hours of quality sleep each night.  Getting enough quality sleep is an important part of health and well-being.  Make your bedroom a place that promotes quality sleep and avoid things that may cause you to have poor sleep, such as alcohol, caffeine, smoking, and large meals.  Talk  to your health care provider if you have trouble falling asleep or staying asleep. This information is not intended to replace advice given to you by your health care provider. Make sure you discuss any questions you have with your health care provider. Document Revised: 01/09/2018 Document Reviewed: 01/09/2018 Elsevier Patient Education  2021 [employer].

## 2021-02-07 DIAGNOSIS — D225 Melanocytic nevi of trunk: Secondary | ICD-10-CM | POA: Diagnosis not present

## 2021-02-07 DIAGNOSIS — L821 Other seborrheic keratosis: Secondary | ICD-10-CM | POA: Diagnosis not present

## 2021-02-07 DIAGNOSIS — D224 Melanocytic nevi of scalp and neck: Secondary | ICD-10-CM | POA: Diagnosis not present

## 2021-02-07 DIAGNOSIS — L309 Dermatitis, unspecified: Secondary | ICD-10-CM | POA: Diagnosis not present

## 2021-02-07 DIAGNOSIS — L438 Other lichen planus: Secondary | ICD-10-CM | POA: Diagnosis not present

## 2021-02-07 DIAGNOSIS — D1801 Hemangioma of skin and subcutaneous tissue: Secondary | ICD-10-CM | POA: Diagnosis not present

## 2021-02-07 DIAGNOSIS — L820 Inflamed seborrheic keratosis: Secondary | ICD-10-CM | POA: Diagnosis not present

## 2021-02-07 DIAGNOSIS — L814 Other melanin hyperpigmentation: Secondary | ICD-10-CM | POA: Diagnosis not present

## 2021-02-07 DIAGNOSIS — L720 Epidermal cyst: Secondary | ICD-10-CM | POA: Diagnosis not present

## 2021-02-09 ENCOUNTER — Other Ambulatory Visit: Payer: Self-pay

## 2021-02-09 ENCOUNTER — Encounter: Payer: Self-pay | Admitting: Gastroenterology

## 2021-02-09 ENCOUNTER — Ambulatory Visit: Payer: Medicare HMO | Admitting: Gastroenterology

## 2021-02-09 VITALS — BP 110/60 | HR 80 | Ht 61.0 in | Wt 134.4 lb

## 2021-02-09 DIAGNOSIS — K529 Noninfective gastroenteritis and colitis, unspecified: Secondary | ICD-10-CM

## 2021-02-09 NOTE — Progress Notes (Signed)
Brooke Whitaker    [id number]    07-Jun-1951  Primary Care Physician:Perini, Elta Guadeloupe, MD  Referring Physician: Crist Infante, [address],  [place] [address]   Chief complaint:  Diarrhea, Rectal bleeding  HPI: 70 year old very pleasant female with complaints of intermittent bright red blood per rectum when she wipes and also in the toilet for past few months  She is having diarrhea with intermittent blood since September 2021.  She feels symptoms are somewhat improved in the past 2 to 3 weeks, is no longer having rectal bleeding and her bowel frequency also has improved  CT angio abdomen pelvis with and without contrast August 28, 2020: Unremarkable  Colonoscopy with biopsies in December 2021 negative for microscopic colitis   Outpatient Encounter Medications as of 02/09/2021  Medication Sig  . acetaminophen (TYLENOL) 500 MG tablet Take 500 mg by mouth every 6 (six) hours as needed for mild pain or headache.   . cetirizine (ZYRTEC) 5 MG tablet Take 5 mg by mouth daily as needed for allergies.  . cholecalciferol (VITAMIN D) 1000 UNITS tablet Take 1,000 Units by mouth every morning.   . Cyanocobalamin (VITAMIN B 12 PO) Take 1,000 mcg by mouth daily.  [name] ELDERBERRY PO Take 1 tablet by mouth daily.   . fluticasone (FLONASE) 50 MCG/ACT nasal spray Place into the nose as needed.  . rosuvastatin (CRESTOR) 5 MG tablet Take 5 mg by mouth daily.  . Turmeric 500 MG CAPS Take 2 capsules by mouth daily.  . valACYclovir (VALTREX) 500 MG tablet TAKE 4 TABLETS BY MOUTH EVERY 12 HOURS X 1 DAY FOR FEVER BLISTER AS NEEDED. (Patient taking differently: Take 2,000 mg by mouth every 12 (twelve) hours. X 1 DAY FOR FEVER BLISTER AS NEEDED.)   No facility-administered encounter medications on file as of 02/09/2021.    Allergies as of 02/09/2021 - Review Complete 02/09/2021  Allergen Reaction Noted  . Penicillins Other (See Comments) 01/13/2014  . Sulfa antibiotics Other (See  Comments) 08/04/2013    Past Medical History:  Diagnosis Date  . Arthritis    RIGHT HIP  . Family history of early CAD   . Hyperlipidemia   . Kidney stone   . Osteopenia   . 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 Sigmoid diverticulosis   . Wears glasses     Past Surgical History:  Procedure Laterality Date  . BREAST BIOPSY  12/ 2012   benign   . COLONOSCOPY  2010  . CYSTOSCOPY/RETROGRADE/URETEROSCOPY/STONE EXTRACTION WITH BASKET Left 04/21/2014   Procedure: cystoscopy, left retrograde pylegram, LEFT URETEROSCOPY, laser lithotripsy with holmium laser, STONE EXTRACTION, uretheral calibration;  Surgeon: Malka So, MD;  Location: [facility];  Service: Urology;  Laterality: Left;  . DILATION AND EVACUATION  1995  . EPISIOTOMY REPAIR, SEVERE TEAR  1989  . LAPAROSCOPIC OVARIAN CYSTECTOMY  10/ 2000  . NM MYOCAR PERF WALL MOTION  12/15/2009   Normal  . US ECHOCARDIOGRAPHY  12/15/2009   Normal study for age: mild MR,TR and trace PI    Family History  Problem Relation Age of Onset  . Diabetes Mother   . Congestive Heart Failure Mother   . Stroke Mother   . Osteoporosis Mother   . Other Mother        gallbladder ruptured  . Diabetes Brother   . Dementia Brother   . Diabetes Sister   . Heart disease Sister   .  Colon cancer Brother        mets to liver  . Heart disease Sister        x2  . Hypertension Sister   . Heart attack Father   . Heart disease Brother        pacemaker  . Stroke Brother   . Thyroid disease Sister   . Hypertension Sister   . Liver cancer Brother   . Heart disease Brother     Social History   Socioeconomic History  . Marital status: Married    Spouse name: Not on file  . Number of children: 2  . Years of education: Not on file  . Highest education level: Not on file  Occupational History  . Occupation: Glass blower/designer  Tobacco Use  . Smoking status: Never Smoker  . Smokeless tobacco: Never Used  Vaping  Use  . Vaping Use: Never used  Substance and Sexual Activity  . Alcohol use: Yes    Alcohol/week: 7.0 standard drinks    Types: 7 Glasses of wine per week    Comment: 1-2 per day  . Drug use: No  . Sexual activity: Not Currently    Partners: Male  Other Topics Concern  . Not on file  Social History Narrative  . Not on file   Social Determinants of Health   Financial Resource Strain: Not on file  Food Insecurity: Not on file  Transportation Needs: Not on file  Physical Activity: Not on file  Stress: Not on file  Social Connections: Not on file  Intimate Partner Violence: Not on file      Review of systems: All other review of systems negative except as mentioned in the HPI.   Physical Exam: Vitals:   02/09/21 1439  BP: 110/60  Pulse: 80   Body mass index is 25.39 kg/m. Gen:      No acute distress HEENT:  sclera anicteric Abd:      soft, non-tender; no palpable masses, no distension Ext:    No edema Neuro: alert and oriented x 3 Psych: normal mood and affect  Data Reviewed:  Reviewed labs, radiology imaging, old records and pertinent past GI work up   Assessment and Plan/Recommendations:  69 year old very pleasant female with complaints of diarrhea and intermittent bright red blood per rectum, recent colonoscopy with biopsies was negative for microscopic colitis Diarrhea possibly secondary to GI infection that has self resolved.  Less likely to be inflammatory bowel disease given recent colonoscopy was unremarkable IBS-diarrhea in the differential Advised patient to stop probiotics and turmeric Okay to eat yogurt daily with live cultures Lactose-free diet Will obtain records of prior colonoscopy and endoscopy from Dr. Liliane Channel office to determine interval for recall colonoscopy Return as needed   The patient was provided an opportunity to ask questions and all were answered. The patient agreed with the plan and demonstrated an understanding of the  instructions.  Damaris Hippo , MD    CC: Crist Infante, MD

## 2021-02-09 NOTE — Patient Instructions (Addendum)
We will obtain your colonoscopy report from Dr Earlean Shawl   Stop Turmeric   Eat daily yogurt   Hold probiotics  Follow up as needed  Due to recent changes in healthcare laws, you may see the results of your imaging and laboratory studies on MyChart before your provider has had a chance to review them.  We understand that in some cases there may be results that are confusing or concerning to you. Not all laboratory results come back in the same time frame and the provider may be waiting for multiple results in order to interpret others.  Please give [place] 48 hours in order for your provider to thoroughly review all the results before contacting the office for clarification of your results.   I appreciate the  opportunity to care for you  Thank You   Harl Bowie , MD

## 2021-02-17 DIAGNOSIS — Z20822 Contact with and (suspected) exposure to covid-19: Secondary | ICD-10-CM | POA: Diagnosis not present

## 2021-02-23 ENCOUNTER — Encounter: Payer: Self-pay | Admitting: Gastroenterology

## 2021-03-12 DIAGNOSIS — S61011A Laceration without foreign body of right thumb without damage to nail, initial encounter: Secondary | ICD-10-CM | POA: Diagnosis not present

## 2021-03-12 DIAGNOSIS — W268XXA Contact with other sharp object(s), not elsewhere classified, initial encounter: Secondary | ICD-10-CM | POA: Diagnosis not present

## 2021-03-12 DIAGNOSIS — M79644 Pain in right finger(s): Secondary | ICD-10-CM | POA: Diagnosis not present

## 2021-03-24 DIAGNOSIS — Z4802 Encounter for removal of sutures: Secondary | ICD-10-CM | POA: Diagnosis not present

## 2021-04-06 ENCOUNTER — Telehealth: Payer: Self-pay | Admitting: Gastroenterology

## 2021-04-06 DIAGNOSIS — K529 Noninfective gastroenteritis and colitis, unspecified: Secondary | ICD-10-CM

## 2021-04-06 NOTE — Telephone Encounter (Signed)
Please check GI pathogen panel to exclude acute GI infection, if negative we plan to start Lomotil 1 tab twice daily as needed to improve symptoms and please schedule office visit soon with me or APP. Thanks

## 2021-04-06 NOTE — Telephone Encounter (Signed)
Patient calling to inform she is experiencing diarrhea/some bleeding/bleeding has stopped..   Patient wants to know how to treat sxs.. Plz advise  Thanks

## 2021-04-06 NOTE — Telephone Encounter (Signed)
Spoke with patient. Patient has complaints of diarrhea that restarted today. Patient states it has "pretty frequent" and that between 10a-12p she has had 5 episodes of diarrhea. Patient also states that with these episodes she has noticed bright red blood. Patient describes the amount of blood she is seeing as the size of a medication bottle top. Also states she is having stomach discomfort she feels is related to the constant bouts of diarrhea. While speaking with patient she stated that she had "just went to the bathroom" still with diarrhea and bright red blood but not as much blood as she has noticed before. Pt says she took a "few" imodium with no improvement a this time. At last OV patient advised to stop Tumeric and probiotics. Patient states she took 2 probiotic pills this morning, but otherwise has not been taking either. Patient states she was advised to contact MD if these episodes returned. Patient would like to know what she should do at this time.  Please advise, thank you

## 2021-04-07 NOTE — Telephone Encounter (Signed)
Spoke with patient. Patient made aware of rec's per MD. Pt scheduled for appt 7/25 @2 :30p (pt is out of town week prior). Pt verbalized understanding and nothing further needed at this time.

## 2021-04-07 NOTE — Addendum Note (Signed)
Addended by: Yevette Edwards on: 04/07/2021 03:02 PM   Modules accepted: Orders

## 2021-04-07 NOTE — Telephone Encounter (Signed)
Spoke with patient, she is aware that she will need to stop by lab at her convenience to pick up stool kit and instructions on how to collect stool sample. Patient states that she took Imodium yesterday and today. Advised that she should hold anymore Imodium until after she leaves the stools sample. Patient will pick up kit today but will return sample early next week. Patient verbalized understanding and had no concerns at the end of the call.  Lab order in epic.

## 2021-04-11 ENCOUNTER — Other Ambulatory Visit: Payer: Medicare HMO

## 2021-04-11 DIAGNOSIS — K529 Noninfective gastroenteritis and colitis, unspecified: Secondary | ICD-10-CM

## 2021-04-13 LAB — GI PROFILE, STOOL, PCR

## 2021-04-25 ENCOUNTER — Other Ambulatory Visit: Payer: Self-pay

## 2021-04-25 MED ORDER — DIPHENOXYLATE-ATROPINE 2.5-0.025 MG PO TABS: 1.0000 | ORAL_TABLET | Freq: Two times a day (BID) | ORAL | 0 refills | Status: DC | PRN

## 2021-04-25 NOTE — Telephone Encounter (Signed)
Lomotil 1 po bid prn, #30, no refills.  Low FODMAP diet to patient to review.

## 2021-05-09 ENCOUNTER — Ambulatory Visit: Payer: Medicare HMO | Admitting: Physician Assistant

## 2021-05-09 ENCOUNTER — Telehealth: Payer: Self-pay

## 2021-05-09 ENCOUNTER — Encounter: Payer: Self-pay | Admitting: Physician Assistant

## 2021-05-09 VITALS — BP 124/70 | HR 72 | Ht 61.0 in | Wt 130.6 lb

## 2021-05-09 DIAGNOSIS — K529 Noninfective gastroenteritis and colitis, unspecified: Secondary | ICD-10-CM

## 2021-05-09 MED ORDER — DICYCLOMINE HCL 10 MG PO CAPS: 10.0000 mg | ORAL_CAPSULE | Freq: Two times a day (BID) | ORAL | 2 refills | Status: DC

## 2021-05-09 NOTE — Progress Notes (Signed)
Chief Complaint: Diarrhea  HPI:    Brooke Whitaker is a 70 year old female with a past medical history as listed below, known to Dr. Silverio Decamp, who returns to clinic today for follow-up of her diarrhea.    08/28/2020 CT angio of the abdomen and pelvis with and without contrast unremarkable.    09/2020 colonoscopy with biopsies negative for microscopic colitis.    02/09/2021 patient seen in clinic by Dr. Silverio Decamp for remittent bright red blood per rectum and diarrhea.  At that time discussed diarrhea and intermittent blood since September 2021.  Symptoms were somewhat better at that time thought diarrhea was possibly secondary to GI infection and less likely IBD given recent colonoscopy was unremarkable.  IBS-D was in the differential.  She was told to stop probiotics and turmeric and eat yogurt with live cultures.  Maintain a lactose-free diet.    04/11/2021 GI profile panel was negative.     04/25/2021 patient given Lomotil.    Today, the patient tells me that about 6 weeks ago she started back with diarrhea.  Explains that this is typically what wakes her from her sleep in the mornings and can be as early as 4:00.  When she has to go it is urgent and explosive and typically loose/watery.  She will go 2-3 times in the morning and then is typically done for the day.  This is often accompanied by abdominal cramping.  Tells me she has seen blood on 2 separate occasions last on July 14 and explains that she will normally be having her normal loose stool and then feels some pressure in her rectum and see a little bit of blood on the toilet paper.  Tells me all this started after being on back-to-back strong antibiotics for a stye in her eye in September 2021.  Prior to this she was normal.   Associated symptoms include abdominal bloating.    Denies fever, chills, weight loss, nausea or vomiting.  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    Sigmoid diverticulosis    Wears glasses     Past Surgical History:  Procedure Laterality Date   BREAST BIOPSY Right 09/2011   benign    COLONOSCOPY  2010   CYSTOSCOPY/RETROGRADE/URETEROSCOPY/STONE EXTRACTION WITH BASKET Left 04/21/2014   Procedure: cystoscopy, left retrograde pylegram, LEFT URETEROSCOPY, laser lithotripsy with holmium laser, STONE EXTRACTION, uretheral calibration;  Surgeon: Malka So, MD;  Location: [facility];  Service: Urology;  Laterality: Left;   DILATION AND EVACUATION  1995   EPISIOTOMY REPAIR, SEVERE TEAR  1989   LAPAROSCOPIC OVARIAN CYSTECTOMY  10/ 2000   NM MYOCAR PERF WALL MOTION  12/15/2009   Normal   US ECHOCARDIOGRAPHY  12/15/2009   Normal study for age: mild MR,TR and trace PI    Current Outpatient Medications  Medication Sig Dispense Refill   acetaminophen (TYLENOL) 500 MG tablet Take 500 mg by mouth every 6 (six) hours as needed for mild pain or headache.      cetirizine (ZYRTEC) 5 MG tablet Take 5 mg by mouth daily as needed for allergies.     cholecalciferol (VITAMIN D) 1000 UNITS tablet Take 1,000 Units by mouth every morning.      Cyanocobalamin (VITAMIN B 12 PO) Take 1,000 mcg by mouth daily.  diphenoxylate-atropine (LOMOTIL) 2.5-0.025 MG tablet Take 1 tablet by mouth 2 (two) times daily as needed for diarrhea or loose stools. 30 tablet 0   ELDERBERRY PO Take 1 tablet by mouth daily.      fluticasone (FLONASE) 50 MCG/ACT nasal spray Place into the nose as needed.     rosuvastatin (CRESTOR) 5 MG tablet Take 5 mg by mouth daily.     valACYclovir (VALTREX) 500 MG tablet TAKE 4 TABLETS BY MOUTH EVERY 12 HOURS X 1 DAY FOR FEVER BLISTER AS NEEDED. (Patient taking differently: Take 2,000 mg by mouth every 12 (twelve) hours. X 1 DAY FOR FEVER BLISTER AS NEEDED.) 30 tablet 2   No current facility-administered medications for this visit.     Allergies as of 05/09/2021 - Review Complete 02/23/2021  Allergen Reaction Noted   Penicillins Other (See Comments) 01/13/2014   Sulfa antibiotics Other (See Comments) 08/04/2013    Family History  Problem Relation Age of Onset   Diabetes Mother    Congestive Heart Failure Mother    Stroke Mother    Osteoporosis Mother    Other Mother        gallbladder ruptured   Diabetes Brother    Dementia Brother    Diabetes Sister    Heart disease Sister    Colon cancer Brother        mets to liver   Heart disease Sister        x2   Hypertension Sister    Heart attack Father    Heart disease Brother        pacemaker   Stroke Brother    Thyroid disease Sister    Hypertension Sister    Liver cancer Brother    Heart disease Brother     Social History   Socioeconomic History   Marital status: Married    Spouse name: Not on file   Number of children: 2   Years of education: Not on file   Highest education level: Not on file  Occupational History   Occupation: Glass blower/designer  Tobacco Use   Smoking status: Never   Smokeless tobacco: Never  Vaping Use   Vaping Use: Never used  Substance and Sexual Activity   Alcohol use: Yes    Alcohol/week: 7.0 standard drinks    Types: 7 Glasses of wine per week    Comment: 1-2 per day   Drug use: No   Sexual activity: Not Currently    Partners: Male  Other Topics Concern   Not on file  Social History Narrative   Not on file   Social Determinants of Health   Financial Resource Strain: Not on file  Food Insecurity: Not on file  Transportation Needs: Not on file  Physical Activity: Not on file  Stress: Not on file  Social Connections: Not on file  Intimate Partner Violence: Not on file    Review of Systems:    Constitutional: No weight loss, fever or chills Cardiovascular: No chest pain Respiratory: No SOB  Gastrointestinal: See HPI and otherwise negative   Physical Exam:  Vital signs: BP 124/70   Pulse 72   Ht 5'  1" (1.549 m)   Wt 130 lb 9.6 oz (59.2 kg)   LMP 10/16/2001   SpO2 99%   BMI 24.68 kg/m    Constitutional:   Pleasant Caucasian female appears to be in NAD, Well developed, Well nourished, alert and cooperative Respiratory: Respirations even and unlabored. Lungs clear to auscultation bilaterally.   No  wheezes, crackles, or rhonchi.  Cardiovascular: Normal S1, S2. No MRG. Regular rate and rhythm. No peripheral edema, cyanosis or pallor.  Gastrointestinal:  Soft, nondistended, nontender. No rebound or guarding. Normal bowel sounds. No appreciable masses or hepatomegaly. Rectal:  Not performed.  Psychiatric:  Demonstrates good judgement and reason without abnormal affect or behaviors.  RELEVANT LABS AND IMAGING: CBC    Component Value Date/Time   WBC 7.7 08/28/2020 1231   RBC 3.90 08/28/2020 1231   HGB 13.6 08/28/2020 1231   HGB 12.3 01/13/2014 1656   HCT 41.6 08/28/2020 1231   PLT 184 08/28/2020 1231   MCV 106.7 (H) 08/28/2020 1231   MCH 34.9 (H) 08/28/2020 1231   MCHC 32.7 08/28/2020 1231   RDW 13.2 08/28/2020 1231   LYMPHSABS 0.8 04/12/2014 1659   MONOABS 0.3 04/12/2014 1659   EOSABS 0.0 04/12/2014 1659   BASOSABS 0.0 04/12/2014 1659    CMP     Component Value Date/Time   NA 143 08/28/2020 1231   K 3.7 08/28/2020 1231   CL 109 08/28/2020 1231   CO2 24 08/28/2020 1231   GLUCOSE 112 (H) 08/28/2020 1231   BUN 18 08/28/2020 1231   CREATININE 0.81 08/28/2020 1231   CREATININE 0.67 12/08/2014 0851   CALCIUM 9.0 08/28/2020 1231   PROT 6.0 (L) 08/28/2020 1231   ALBUMIN 4.2 08/28/2020 1231   AST 24 08/28/2020 1231   ALT 19 08/28/2020 1231   ALKPHOS 70 08/28/2020 1231   BILITOT 1.3 (H) 08/28/2020 1231   GFRNONAA >60 08/28/2020 1231   GFRAA 73 (L) 04/12/2014 1659    Assessment: 1.  Chronic diarrhea: 09/2020 colonoscopy with biopsies negative for microscopic colitis, thought to possibly be infectious at last visit, but GI pathogen panel negative, continues with symptoms  typically 3 loose watery stools in the morning with some abdominal discomfort; consider IBS-D  Plan: 1.  Started the patient on Dicyclomine 10 mg.  The symptoms typically occur when she wakes up from sleep.  Recommend that she take 1 tab prior to going to bed and see if this helps.  If it does not then would add another 20 to 30 minutes before eating dinner.  Prescribed #60 with 5 refills. 2.  Encouraged the patient to go back on her VSL #3 probiotic daily.  She should continue this for at least 2 months. 3.  If this does not help then will increase dosage of Dicyclomine in the future. 4.  Patient to follow in clinic with me in 2 months or sooner if necessary.  She will communicate through [place] in the interim to let me know how she is doing.  Ellouise Newer, PA-C [facility] 05/09/2021, 2:32 PM  Cc: Crist Infante, MD

## 2021-05-09 NOTE — Patient Instructions (Addendum)
It was my pleasure to provide care to you today. Based on our discussion, I am providing you with my recommendations below:  RECOMMENDATION(S):   Dicyclomine - please take 1 tablet 2 times daily. Try starting one before bed, then add before dinner no relief to your symptoms Please take VSL #3 every day  FOLLOW UP:  I would like for you to follow up with me as needed. Please call the office at [phone] to schedule your appointment.  BMI:  If you are age 85 or older, your body mass index should be between 23-30. Your Body mass index is 24.68 kg/m. If this is out of the aforementioned range listed, please consider follow up with your Primary Care Provider.  MY CHART:  The [facility] providers would like to encourage you to use [facility] to communicate with providers for non-urgent requests or questions.  Due to long hold times on the telephone, sending your provider a message by [facility] may be a faster and more efficient way to get a response.  Please allow 48 business hours for a response.  Please remember that this is for non-urgent requests.   Thank you for trusting me with your gastrointestinal care!    Ellouise Newer, [place]

## 2021-05-09 NOTE — Telephone Encounter (Signed)
APPROVAL  Medication: [place]: [employer] PA response: Approved Approval dates: 10/16/20 through 10/15/21 Misc. Notes: Brooke Whitaker (Key: [id number])  This request has been approved.  Please note any additional information provided by [employer] Part D at the bottom of your screen. Brooke Whitaker Brooke Whitaker - PA Case IDCR:[id number] Need help? Call us at [phone] Outcome Approvedtoday Your request has been approved Drug Dicyclomine HCl '10MG'$  capsules Form [employer] Electronic PA Form [phone] NCPDP)

## 2021-05-09 NOTE — Telephone Encounter (Signed)
PRIOR AUTHORIZATION  PA initiation date: 05/09/21  Medication: [place]: [employer] Submission completed electronically through [employer] My Meds: Yes  Will await insurance response re: approval/denial.  Layne Benton (Key: [id number])  Your information has been submitted to [place] Medicare Part D. [employer] Part D will review the request and will issue a decision, typically within 1-3 days from your submission. You can check the updated outcome later by reopening this request.  If [employer] Part D has not responded in 1-3 days or if you have any questions about your ePA request, please contact [place] Medicare Part D at [phone]. If you think there may be a problem with your PA request, use our live chat feature at the bottom right.

## 2021-05-11 DIAGNOSIS — R1084 Generalized abdominal pain: Secondary | ICD-10-CM | POA: Diagnosis not present

## 2021-05-11 DIAGNOSIS — R1032 Left lower quadrant pain: Secondary | ICD-10-CM | POA: Diagnosis not present

## 2021-05-26 DIAGNOSIS — L570 Actinic keratosis: Secondary | ICD-10-CM | POA: Diagnosis not present

## 2021-05-26 DIAGNOSIS — L820 Inflamed seborrheic keratosis: Secondary | ICD-10-CM | POA: Diagnosis not present

## 2021-06-13 NOTE — Progress Notes (Signed)
Reviewed and agree with documentation and assessment and plan. K. Veena [name] , MD   

## 2021-06-17 ENCOUNTER — Telehealth: Payer: Self-pay | Admitting: Physician Assistant

## 2021-06-17 DIAGNOSIS — K529 Noninfective gastroenteritis and colitis, unspecified: Secondary | ICD-10-CM

## 2021-06-17 NOTE — Telephone Encounter (Signed)
Patient called states she is still having BM issues and is also having blood come out is highly concerned and is seeking advise until her upcoming appointment 07/08/21.

## 2021-06-17 NOTE — Telephone Encounter (Signed)
Spoke with patient in regards to recommendations. Pt will begin tomorrow evening since she has already taken Imodium today. Pt verbalized understanding and had no concerns at the end of the call.

## 2021-06-17 NOTE — Telephone Encounter (Signed)
Spoke with patient, she states that she is still having diarrhea. She states that she had about 5 episodes today. She reports pressure in the rectum and urgency. Patient reports passing mucous and BRB today. She noticed the blood in the commode. She states that she took 2 Imodium tablets this morning and then another one not too long ago. Pt reports that she has not taken the Dicyclomine in about a week because it stopped her up. She reports that she is still taking the VSL #3 daily. Denies fever, SOB, or dizziness. We have moved her appt to 06/29/21. Patient seeking advise in the interim. Please advise, thanks.

## 2021-06-21 MED ORDER — DICYCLOMINE HCL 10 MG PO CAPS: 10.0000 mg | ORAL_CAPSULE | Freq: Three times a day (TID) | ORAL | 2 refills | Status: DC

## 2021-06-21 NOTE — Telephone Encounter (Signed)
Lm on vm for patient to return call 

## 2021-06-21 NOTE — Telephone Encounter (Signed)
Spoke with patient in regards to her recommendations, pt is aware that new RX has been sent to her pharmacy on file. Pt would like to keep her appt with Anderson Malta on 06/29/21 because she has several questions. Pt is aware that if she needs another follow up they will schedule at the time of her visit. Pt verbalized understanding and had no concerns at the end of the call.

## 2021-06-21 NOTE — Telephone Encounter (Signed)
Inbound call from patient requesting a call back to possibly increase dicyclomine.

## 2021-06-21 NOTE — Addendum Note (Signed)
Addended by: Yevette Edwards on: 06/21/2021 02:50 PM   Modules accepted: Orders

## 2021-06-21 NOTE — Telephone Encounter (Signed)
Spoke with patient, she states that she lost 5 lbs in the last week and states that she tried taking the Dicyclomine every other evening as directed but continues to have diarrhea. Pt is no longer taking the Imodium, she states that the Imodium may have worked better. She is not sure if you wanted her to continue to take the Imodium along with the Dicyclomine or if you want to try her on something else. Pt states that she has been taking the Dicyclomine every 12 hours with no relief. She reports abdominal discomfort. Pt advised that I can leave a detailed vm with recommendations if she does not answer. Please advise, thanks.

## 2021-06-24 NOTE — Telephone Encounter (Signed)
Pt called in today to clarify how she was supposed to be taking the new dicyclomine. Advised that she should be taking Dicyclomine 10 mg QID before meals and at bedtime and then increase to 20 mg 4 times a day if not improvement. Pt states that she has been taking 20 mg for the past couple of days with no improvement. Pt has an appt next week, advised that she continue medication as prescribed and take Imodium as needed. Advised that after her appt a colonoscopy may be recommended. Pt verbalized understanding and had no concerns at the end of the call.

## 2021-06-29 ENCOUNTER — Encounter: Payer: Self-pay | Admitting: Physician Assistant

## 2021-06-29 ENCOUNTER — Ambulatory Visit: Payer: Medicare HMO | Admitting: Physician Assistant

## 2021-06-29 ENCOUNTER — Other Ambulatory Visit (INDEPENDENT_AMBULATORY_CARE_PROVIDER_SITE_OTHER): Payer: Medicare HMO

## 2021-06-29 VITALS — BP 110/60 | HR 76 | Ht 61.0 in | Wt 126.4 lb

## 2021-06-29 DIAGNOSIS — K529 Noninfective gastroenteritis and colitis, unspecified: Secondary | ICD-10-CM

## 2021-06-29 DIAGNOSIS — R1084 Generalized abdominal pain: Secondary | ICD-10-CM | POA: Diagnosis not present

## 2021-06-29 LAB — BASIC METABOLIC PANEL
BUN: 16 mg/dL (ref 6–23)
CO2: 30 mEq/L (ref 19–32)
Calcium: 8.9 mg/dL (ref 8.4–10.5)
Chloride: 104 mEq/L (ref 96–112)
Creatinine, Ser: 0.64 mg/dL (ref 0.40–1.20)
GFR: 90.57 mL/min (ref >60.00)
Glucose, Bld: 83 mg/dL (ref 70–99)
Potassium: 3.8 mEq/L (ref 3.5–5.1)
Sodium: 141 mEq/L (ref 135–145)

## 2021-06-29 NOTE — Progress Notes (Signed)
Chief Complaint: Follow-up chronic diarrhea and abdominal cramping  HPI:    Mrs. Brooke Whitaker is a 70 year old female with a past medical history as listed below, known to Dr. Silverio Decamp, who returns to clinic today for follow-up of her diarrhea.    08/28/2020 CT angio of the abdomen pelvis with and without contrast was unremarkable.    09/2020 colonoscopy with biopsies negative for microscopic colitis.    02/09/2021 patient seen in clinic by Dr. Silverio Decamp for remittent bright red blood per rectum and diarrhea.  At that time discussed diarrhea and intermittent blood since September 2021.  Symptoms were somewhat better at that time thought diarrhea was possibly secondary to GI infection and less likely IBD given recent colonoscopy was unremarkable.  IBS-D was in the differential.  She was told to stop probiotics and turmeric and eat yogurt with live cultures.  Maintain a lactose-free diet.    04/11/2021 GI profile panel was negative.     04/25/2021 patient given Lomotil.    05/09/2021 patient seen in clinic by me and described that about 6 weeks prior to that she had started with diarrhea which woke her from her sleep in the mornings as early as 4:00 in the morning, describes urgent explosive and loose/watery stool 2-3 times in the morning and then is typically done for the day.  Also describes some bloody mucus on separate occasions.  Described all this started after being on back-to-back strong antibiotics for a stye in her eye in September 2021.  Prior to this she was completely normal.  At that time started patient on Dicyclomine 10 mg twice daily.  Encouraged her to go back on VSL #3 probiotic.    06/17/2021 patient called and continued diarrhea.  At that time increased her Dicyclomine to 10 mg 4 times daily 2030 minutes before meals and at bedtime.    Today, the patient presents to clinic and tells me she has been taking her Dicyclomine 10 mg 4 times a day over the past week and a half with no real change in  her symptoms.  She grew tired of things and yesterday did not take the Dicyclomine and instead took Imodium 2 tabs after the first loose stool and 1 more after another stool and then did not have any further bowel movements all day and in fact slept all night long.  Tells me that this diarrhea seems to come in spurts but this episode has lasted at least 2-1/2 weeks now with no break at all.  Describes losing around 5 pounds over this time.  Continues to see occasional bright red blood and mucus mixed in with her stool and describes an overall generalized abdominal tenderness.  Patient is worried that something else is going on because she was perfectly fine prior to all of this.    Denies fever, chills, heartburn or reflux.  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    Sigmoid diverticulosis    Wears glasses     Past Surgical History:  Procedure Laterality Date   BREAST BIOPSY Right 09/2011   benign    COLONOSCOPY  2010   CYSTOSCOPY/RETROGRADE/URETEROSCOPY/STONE EXTRACTION WITH BASKET Left 04/21/2014   Procedure: cystoscopy, left retrograde pylegram, LEFT URETEROSCOPY, laser lithotripsy with holmium laser, STONE EXTRACTION, uretheral calibration;  Surgeon: Marshall Cork  Jeffie Pollock, MD;  Location: [facility];  Service: Urology;  Laterality: Left;   DILATION AND EVACUATION  1995   EPISIOTOMY REPAIR, SEVERE TEAR  1989   LAPAROSCOPIC OVARIAN CYSTECTOMY  10/ 2000   NM MYOCAR PERF WALL MOTION  12/15/2009   Normal   US ECHOCARDIOGRAPHY  12/15/2009   Normal study for age: mild MR,TR and trace PI    Current Outpatient Medications  Medication Sig Dispense Refill   acetaminophen (TYLENOL) 500 MG tablet Take 500 mg by mouth every 6 (six) hours as needed for mild pain or headache.      cetirizine (ZYRTEC) 5 MG tablet Take 5 mg by mouth  daily as needed for allergies.     cholecalciferol (VITAMIN D) 1000 UNITS tablet Take 1,000 Units by mouth every morning.      ciclopirox (LOPROX) 0.77 % cream Apply 1 application topically daily.     Cyanocobalamin (VITAMIN B 12 PO) Take 1,000 mcg by mouth daily.     dicyclomine (BENTYL) 10 MG capsule Take 1 capsule (10 mg total) by mouth 4 (four) times daily -  before meals and at bedtime. 120 capsule 2   ELDERBERRY PO Take 1 tablet by mouth daily.      fluticasone (FLONASE) 50 MCG/ACT nasal spray Place into the nose as needed.     rosuvastatin (CRESTOR) 5 MG tablet Take 5 mg by mouth daily.     valACYclovir (VALTREX) 500 MG tablet TAKE 4 TABLETS BY MOUTH EVERY 12 HOURS X 1 DAY FOR FEVER BLISTER AS NEEDED. (Patient taking differently: Take 2,000 mg by mouth every 12 (twelve) hours. X 1 DAY FOR FEVER BLISTER AS NEEDED.) 30 tablet 2   No current facility-administered medications for this visit.    Allergies as of 06/29/2021 - Review Complete 06/29/2021  Allergen Reaction Noted   Penicillins Other (See Comments) 01/13/2014   Sulfa antibiotics Other (See Comments) 08/04/2013    Family History  Problem Relation Age of Onset   Diabetes Mother    Congestive Heart Failure Mother    Stroke Mother    Osteoporosis Mother    Other Mother        gallbladder ruptured   Diabetes Brother    Dementia Brother    Diabetes Sister    Heart disease Sister    Colon cancer Brother        mets to liver   Heart disease Sister        x2   Hypertension Sister    Heart attack Father    Heart disease Brother        pacemaker   Stroke Brother    Thyroid disease Sister    Hypertension Sister    Liver cancer Brother    Heart disease Brother     Social History   Socioeconomic History   Marital status: Married    Spouse name: Not on file   Number of children: 2   Years of education: Not on file   Highest education level: Not on file  Occupational History   Occupation: Glass blower/designer  Tobacco  Use   Smoking status: Never    Passive exposure: Never   Smokeless tobacco: Never  Vaping Use   Vaping Use: Never used  Substance and Sexual Activity   Alcohol use: Yes    Alcohol/week: 2.0 standard drinks    Types: 2 Glasses of wine per week   Drug use: No   Sexual activity: Not Currently    Partners: Male  Other Topics Concern   Not on file  Social History Narrative   Not on file   Social Determinants of Health   Financial Resource Strain: Not on file  Food Insecurity: Not on file  Transportation Needs: Not on file  Physical Activity: Not on file  Stress: Not on file  Social Connections: Not on file  Intimate Partner Violence: Not on file    Review of Systems:    Constitutional: No weight loss, fever or chills Cardiovascular: No chest pain Respiratory: No SOB  Gastrointestinal: See HPI and otherwise negative   Physical Exam:  Vital signs: BP 110/60 (BP Location: Left Arm, Patient Position: Sitting, Cuff Size: Normal)   Pulse 76   Ht '5\' 1"'$  (1.549 m)   Wt 126 lb 6 oz (57.3 kg)   LMP 10/16/2001   BMI 23.88 kg/m   Constitutional:   Pleasant Elderly Caucasian female appears to be in NAD, Well developed, Well nourished, alert and cooperative Respiratory: Respirations even and unlabored. Lungs clear to auscultation bilaterally.   No wheezes, crackles, or rhonchi.  Cardiovascular: Normal S1, S2. No MRG. Regular rate and rhythm. No peripheral edema, cyanosis or pallor.  Gastrointestinal:  Soft, nondistended, nontender. No rebound or guarding. Normal bowel sounds. No appreciable masses or hepatomegaly. Psychiatric:Demonstrates good judgement and reason without abnormal affect or behaviors.  No recent labs or imaging.  Assessment: 1.  Chronic diarrhea: 12/21 colonoscopy with biopsies negative for microscopic colitis, GI pathogen panel negative, continues with symptoms-no change with Dicyclomine 10 mg 4 times daily; consider IBS-D versus other  Plan: 1.  Patient is  worried that she has some sort of malignancy.  Will repeat CT of the abdomen and pelvis at this point to exclude abdominal cancers.  Discussed with the patient that if this is negative/normal and the increase in Dicyclomine discussed today still does not change her symptoms we will discuss repeat colonoscopy with Dr. Silverio Decamp. 2.  Increased Dicyclomine to 20 mg 4 times daily, 20 to 30 minutes before meals and at bedtime.  Patient tells me she has enough of this medication at home. 3.  Patient to follow in clinic per recommendations after CT above.  Ellouise Newer, PA-C [facility] 06/29/2021, 1:42 PM  Cc: Crist Infante, MD

## 2021-06-29 NOTE — Patient Instructions (Signed)
Your provider has requested that you go to the basement level for lab work before leaving today. Press "B" on the elevator. The lab is located at the first door on the left as you exit the elevator.  We have sent the following medications to your pharmacy for you to pick up at your convenience: Dicyclomine 20 mg four times daily 20-30 minutes before meals and at bedtime.   You have been scheduled for a CT scan of the abdomen and pelvis at [facility] ([address]---this is in the same building as [employer]).   You are scheduled on Wednesday 07/06/20 at 9 am. You should arrive 15 minutes prior to your appointment time for registration. Please follow the written instructions below on the day of your exam:  WARNING: IF YOU ARE ALLERGIC TO IODINE/X-RAY DYE, PLEASE NOTIFY RADIOLOGY IMMEDIATELY AT [phone]! YOU WILL BE GIVEN A 13 HOUR PREMEDICATION PREP.  1) Do not eat or drink anything after 5 am (4 hours prior to your test) 2) You have been given 2 bottles of oral contrast to drink. The solution may taste better if refrigerated, but do NOT add ice or any other liquid to this solution. Shake well before drinking.    Drink 1 bottle of contrast @ 7 am (2 hours prior to your exam)  Drink 1 bottle of contrast @ 8 am (1 hour prior to your exam)  You may take any medications as prescribed with a small amount of water, if necessary. If you take any of the following medications: METFORMIN, GLUCOPHAGE, GLUCOVANCE, AVANDAMET, RIOMET, FORTAMET, [place] MET, JANUMET, GLUMETZA or METAGLIP, you MAY be asked to HOLD this medication 48 hours AFTER the exam.  The purpose of you drinking the oral contrast is to aid in the visualization of your intestinal tract. The contrast solution may cause some diarrhea. Depending on your individual set of symptoms, you may also receive an intravenous injection of x-ray contrast/dye. Plan on being at [facility] for 30 minutes or longer,  depending on the type of exam you are having performed.  This test typically takes 30-45 minutes to complete.  If you have any questions regarding your exam or if you need to reschedule, you may call the CT department at [phone] between the hours of 8:00 am and 5:00 pm, Monday-Friday.  ___________________________________________________________________

## 2021-07-06 ENCOUNTER — Other Ambulatory Visit: Payer: Self-pay

## 2021-07-06 ENCOUNTER — Ambulatory Visit (INDEPENDENT_AMBULATORY_CARE_PROVIDER_SITE_OTHER)
Admission: RE | Admit: 2021-07-06 | Discharge: 2021-07-06 | Disposition: A | Discharge status: Home / Self Care | Payer: Medicare HMO | Source: Ambulatory Visit | Attending: Physician Assistant | Admitting: Physician Assistant

## 2021-07-06 DIAGNOSIS — R634 Abnormal weight loss: Secondary | ICD-10-CM | POA: Diagnosis not present

## 2021-07-06 DIAGNOSIS — M160 Bilateral primary osteoarthritis of hip: Secondary | ICD-10-CM | POA: Diagnosis not present

## 2021-07-06 DIAGNOSIS — R197 Diarrhea, unspecified: Secondary | ICD-10-CM | POA: Diagnosis not present

## 2021-07-06 DIAGNOSIS — R1084 Generalized abdominal pain: Secondary | ICD-10-CM

## 2021-07-06 DIAGNOSIS — K7689 Other specified diseases of liver: Secondary | ICD-10-CM | POA: Diagnosis not present

## 2021-07-06 MED ORDER — IOHEXOL 350 MG/ML SOLN
75.0000 mL | Freq: Once | INTRAVENOUS | Status: AC | PRN
  Administered 2021-07-06: 75 mL via INTRAVENOUS

## 2021-07-08 ENCOUNTER — Ambulatory Visit: Payer: Medicare HMO | Admitting: Physician Assistant

## 2021-07-11 NOTE — Progress Notes (Signed)
Reviewed and agree with documentation and assessment and plan. K. Veena Kazia Grisanti , MD   

## 2021-07-12 ENCOUNTER — Telehealth: Payer: Self-pay | Admitting: Physician Assistant

## 2021-07-12 DIAGNOSIS — R197 Diarrhea, unspecified: Secondary | ICD-10-CM

## 2021-07-12 DIAGNOSIS — K529 Noninfective gastroenteritis and colitis, unspecified: Secondary | ICD-10-CM

## 2021-07-12 NOTE — Telephone Encounter (Signed)
Given acute recurrent diarrhea, will need to exclude active GI infection. Please recheck C.diff, GI pathogen panel.  Switch to Robinul 2mg  BID prn instead of dicylomine. If has persistent diarrhea can try a course of Rifaximin for possible IBS-D.

## 2021-07-12 NOTE — Telephone Encounter (Signed)
Spoke with patient, she states that she was doing OK for the past couple of days. Pt reports that this morning the diarrhea started again. Pt reports that she had about 9-10 episodes of loose stools before 12:30 pm. Pt reports that she has rectal pressure as well, she reports that she would pass BRB and mucous the last 4 times. Pt states that she has been slowly  trying to advance her diet this week. Pt states that she was feeling "off" when she was passing blood. Pt reports that she was passing about a thimble amount of blood. She denies any SOB. Denies any fever or nausea. Pt states that she laid down and put her feet above her head and placed an ice pack on her rectum which helped some. Patient states that she is still taking Dicyclomine 10 mg QID as directed along with Imodium PRN. Pt is scheduled for a follow up with you on 07/21/21. Please advise, thanks

## 2021-07-13 ENCOUNTER — Other Ambulatory Visit: Payer: Medicare HMO

## 2021-07-13 MED ORDER — GLYCOPYRROLATE 2 MG PO TABS: 2.0000 mg | ORAL_TABLET | Freq: Two times a day (BID) | ORAL | 2 refills | Status: DC | PRN

## 2021-07-13 NOTE — Telephone Encounter (Signed)
Spoke with patient in regards to recommendations. Pt advised to discontinue Dicyclomine and start Robinul. Pt will go by the lab to pick up stool kits, she is aware that she will have to collect sample at home and return it to the lab. Advised that stool study results take a few days to get back and hopefully we will have them prior to her follow up appt. Pt verbalized understanding of all information and had no concerns at the end of the call.

## 2021-07-14 ENCOUNTER — Other Ambulatory Visit: Payer: Medicare HMO

## 2021-07-14 DIAGNOSIS — R197 Diarrhea, unspecified: Secondary | ICD-10-CM | POA: Diagnosis not present

## 2021-07-14 DIAGNOSIS — K529 Noninfective gastroenteritis and colitis, unspecified: Secondary | ICD-10-CM

## 2021-07-14 NOTE — Telephone Encounter (Signed)
I spoke with patient, she states that she did research on Robinul and it states that it treats stomach ulcers. Advised that this medication is used for multiple things and in her situation it is for the abdominal cramping as needed. Pt had concerns about diarrhea, advised that we are awaiting stool study results. Advised patient that once she is able to complete the stools sample she can take Imodium as needed. Advised that Dr. Silverio Decamp with further advise once we have stool study results back. Pt verbalized understanding and had no concerns at the end of the call.

## 2021-07-15 ENCOUNTER — Encounter: Payer: Self-pay | Admitting: Cardiovascular Disease

## 2021-07-15 ENCOUNTER — Ambulatory Visit: Payer: Medicare HMO | Admitting: Cardiovascular Disease

## 2021-07-15 ENCOUNTER — Other Ambulatory Visit: Payer: Self-pay

## 2021-07-15 VITALS — BP 122/60 | HR 67 | Ht 61.0 in | Wt 124.6 lb

## 2021-07-15 DIAGNOSIS — R002 Palpitations: Secondary | ICD-10-CM

## 2021-07-15 DIAGNOSIS — E782 Mixed hyperlipidemia: Secondary | ICD-10-CM

## 2021-07-15 DIAGNOSIS — Z8249 Family history of ischemic heart disease and other diseases of the circulatory system: Secondary | ICD-10-CM | POA: Diagnosis not present

## 2021-07-15 NOTE — Assessment & Plan Note (Signed)
History of hyperlipidemia on low-dose statin therapy with lipid profile performed 11/26/2020 revealing total cholesterol 195, LDL of 89 and HDL of 91.

## 2021-07-15 NOTE — Progress Notes (Signed)
07/15/2021 Brooke Whitaker   [birth year]  [id number]  Primary Physician Crist Infante, MD Primary Cardiologist: [name] Lupe Carney, [place]  HPI:  Brooke Whitaker is a 70 y.o.  mildly overweight, married Caucasian female, mother of 2, patient of [name] who I saw for a virtual telemedicine video visit 10/01/2019.  Her risk factors include dyslipidemia and a strong family history of heart disease. She had a father who had his first MI at age 44 and died of an MI at age 69. She had a sister who died suddenly of an MI at age 63.  She had an echo and a Myoview performed March 2011 which were entirely normal.    Since I saw her almost 2 years ago for a virtual video visit she continues to do well.  She did have a coronary calcium score performed by Dr. Joylene Draft 12/25/2020 which was 0.  She gets occasional palpitations in her throat but this is fairly the infrequent.  She otherwise denies chest pain or shortness of breath.  Her most recent lipid profile performed 11/26/2020 revealed total cholesterol 195, LDL of 89 and HDL of 91.   Current Meds  Medication Sig   acetaminophen (TYLENOL) 500 MG tablet Take 500 mg by mouth every 6 (six) hours as needed for mild pain or headache.    cetirizine (ZYRTEC) 5 MG tablet Take 5 mg by mouth daily as needed for allergies.   cholecalciferol (VITAMIN D) 1000 UNITS tablet Take 1,000 Units by mouth every morning.    ciclopirox (LOPROX) 0.77 % cream Apply 1 application topically daily.   Cyanocobalamin (VITAMIN B 12 PO) Take 1,000 mcg by mouth daily.   rosuvastatin (CRESTOR) 5 MG tablet Take 5 mg by mouth daily.     Allergies  Allergen Reactions   Penicillins Other (See Comments)    UNKNOWN Childhood REACTION   Sulfa Antibiotics Other (See Comments)    UNKNOWN Childhood REACTION    Social History   Socioeconomic History   Marital status: Married    Spouse name: Not on file   Number of children: 2   Years of education: Not on file    Highest education level: Not on file  Occupational History   Occupation: Glass blower/designer  Tobacco Use   Smoking status: Never    Passive exposure: Never   Smokeless tobacco: Never  Vaping Use   Vaping Use: Never used  Substance and Sexual Activity   Alcohol use: Yes    Alcohol/week: 2.0 standard drinks    Types: 2 Glasses of wine per week   Drug use: No   Sexual activity: Not Currently    Partners: Male  Other Topics Concern   Not on file  Social History Narrative   Not on file   Social Determinants of Health   Financial Resource Strain: Not on file  Food Insecurity: Not on file  Transportation Needs: Not on file  Physical Activity: Not on file  Stress: Not on file  Social Connections: Not on file  Intimate Partner Violence: Not on file     Review of Systems: General: negative for chills, fever, night sweats or weight changes.  Cardiovascular: negative for chest pain, dyspnea on exertion, edema, orthopnea, palpitations, paroxysmal nocturnal dyspnea or shortness of breath Dermatological: negative for rash Respiratory: negative for cough or wheezing Urologic: negative for hematuria Abdominal: negative for nausea, vomiting, diarrhea, bright red blood per rectum, melena, or hematemesis Neurologic: negative for visual changes,  syncope, or dizziness All other systems reviewed and are otherwise negative except as noted above.    Blood pressure 122/60, pulse 67, height 5\' 1"  (1.549 m), weight 124 lb 9.6 oz (56.5 kg), last menstrual period 10/16/2001, SpO2 97 %.  General appearance: alert and no distress Neck: no adenopathy, no carotid bruit, no JVD, supple, symmetrical, trachea midline, and thyroid not enlarged, symmetric, no tenderness/mass/nodules Lungs: clear to auscultation bilaterally Heart: regular rate and rhythm, S1, S2 normal, no murmur, click, rub or gallop Extremities: extremities normal, atraumatic, no cyanosis or edema Pulses: 2+ and symmetric Skin: Skin  color, texture, turgor normal. No rashes or lesions Neurologic: Grossly normal  EKG sinus rhythm at 67 with low limb voltage.  I personally reviewed this EKG.  ASSESSMENT AND PLAN:   Hyperlipidemia History of hyperlipidemia on low-dose statin therapy with lipid profile performed 11/26/2020 revealing total cholesterol 195, LDL of 89 and HDL of 91.     [name] FACP,FACC,FAHA, [facility] 07/15/2021 11:41 AM

## 2021-07-15 NOTE — Patient Instructions (Signed)

## 2021-07-16 LAB — CLOSTRIDIUM DIFFICILE BY PCR: Toxigenic C. Difficile by PCR: NEGATIVE

## 2021-07-18 LAB — GI PROFILE, STOOL, PCR

## 2021-07-21 ENCOUNTER — Ambulatory Visit: Payer: Medicare HMO | Admitting: Physician Assistant

## 2021-07-21 ENCOUNTER — Encounter: Payer: Self-pay | Admitting: Physician Assistant

## 2021-07-21 VITALS — BP 130/70 | HR 68 | Ht 61.0 in | Wt 125.5 lb

## 2021-07-21 DIAGNOSIS — K529 Noninfective gastroenteritis and colitis, unspecified: Secondary | ICD-10-CM | POA: Diagnosis not present

## 2021-07-21 NOTE — Progress Notes (Signed)
Chief Complaint: Chronic diarrhea  HPI:    Brooke Whitaker is a 70 year old female with a past medical history as listed, known to Dr. Silverio Decamp returns to clinic today for follow-up of her chronic diarrhea.    08/28/2020 CT angio of the abdomen pelvis with and without contrast was unremarkable.    09/2020 colonoscopy with biopsies negative for microscopic colitis.    02/09/2021 patient seen in clinic by Dr. Silverio Decamp for remittent bright red blood per rectum and diarrhea.  At that time discussed diarrhea and intermittent blood since September 2021.  Symptoms were somewhat better at that time thought diarrhea was possibly secondary to GI infection and less likely IBD given recent colonoscopy was unremarkable.  IBS-D was in the differential.  She was told to stop probiotics and turmeric and eat yogurt with live cultures.  Maintain a lactose-free diet.    04/11/2021 GI profile panel was negative.     04/25/2021 patient given Lomotil.    05/09/2021 patient seen in clinic by me and described that about 6 weeks prior to that she had started with diarrhea which woke her from her sleep in the mornings as early as 4:00 in the morning, describes urgent explosive and loose/watery stool 2-3 times in the morning and then is typically done for the day.  Also describes some bloody mucus on separate occasions.  Described all this started after being on back-to-back strong antibiotics for a stye in her eye in September 2021.  Prior to this she was completely normal.  At that time started patient on Dicyclomine 10 mg twice daily.  Encouraged her to go back on VSL #3 probiotic.    06/17/2021 patient called and continued diarrhea.  At that time increased her Dicyclomine to 10 mg 4 times daily 20-30 minutes before meals and at bedtime.    06/29/2021 patient described taking her Dicyclomine 10 mg 4 times a day for a week and a half with no change in symptoms.  Described that the diarrhea seem to come in spurts but this episode lasted  2-1/2 weeks with no break.  At that time patient worried that she had some sort of malignancy.  We repeated a CT of the abdomen pelvis.  Increase Dicyclomine to 20 mg 4 times a day and discussed that his CT was negative and she continues symptoms and we could possibly repeat colonoscopy.    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  Aortic atherosclerosis and otherwise was normal.    07/12/2021 patient had repeat C. difficile and GI pathogen panel and was switched to Robinul 2 mg twice daily instead of dicyclomine.  It was discussed that she had persistent diarrhea could try a course of Rifaximin for possible IBS-D.    07/15/2019 2 GI pathogen panel and C. difficile were negative.    Today, patient presents to clinic and tells me that really she has continued with similar symptoms.  Over the past week has had maybe a little bit more form to her first stool in the morning but this is typically followed by 2 or 3 loose stools and oftentimes this will recur again throughout the day.  She has only been using the Robinul as needed and has only taken it twice when she had a slight amount of stomach discomfort.  Continues to see some blood-tinged mucus at times.    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    Sigmoid diverticulosis    Wears glasses     Past Surgical History:  Procedure Laterality Date   BREAST BIOPSY Right 09/2011   benign    COLONOSCOPY  2010   CYSTOSCOPY/RETROGRADE/URETEROSCOPY/STONE EXTRACTION WITH BASKET Left 04/21/2014   Procedure: cystoscopy, left retrograde pylegram, LEFT URETEROSCOPY, laser lithotripsy with holmium laser, STONE EXTRACTION, uretheral calibration;  Surgeon: Malka So, MD;  Location: [facility];  Service: Urology;  Laterality: Left;   DILATION AND EVACUATION  1995   EPISIOTOMY REPAIR, SEVERE TEAR  1989   LAPAROSCOPIC OVARIAN CYSTECTOMY  10/ 2000   NM MYOCAR PERF WALL MOTION  12/15/2009   Normal   US ECHOCARDIOGRAPHY  12/15/2009   Normal study for age: mild MR,TR and trace PI    Current Outpatient Medications  Medication Sig Dispense Refill   acetaminophen (TYLENOL) 500 MG tablet Take 500 mg by mouth every 6 (six) hours as needed for mild pain or headache.      cetirizine (ZYRTEC) 5 MG tablet Take 5 mg by mouth daily as needed for allergies.     cholecalciferol (VITAMIN D) 1000 UNITS tablet Take 1,000 Units by mouth every morning.      ciclopirox (LOPROX) 0.77 % cream Apply 1 application topically daily.     Cyanocobalamin (VITAMIN B 12 PO) Take 1,000 mcg by mouth daily.     dicyclomine (BENTYL) 10 MG capsule Take 1 capsule (10 mg total) by mouth 4 (four) times daily -  before meals and at bedtime. (Patient not taking: Reported on 07/15/2021) 120 capsule 2   ELDERBERRY PO Take 1 tablet by mouth daily.  (Patient not taking: Reported on 07/15/2021)     fluticasone (FLONASE) 50 MCG/ACT nasal spray Place into the nose as needed. (Patient not taking: Reported on 07/15/2021)     glycopyrrolate (ROBINUL) 2 MG tablet Take 1 tablet (2 mg total) by mouth 2 (two) times daily as needed (abdominal cramping). 60 tablet 2   rosuvastatin (CRESTOR) 5 MG tablet Take 5 mg by mouth daily.     valACYclovir (VALTREX) 500 MG tablet TAKE 4 TABLETS BY MOUTH EVERY 12 HOURS X 1 DAY FOR FEVER BLISTER AS NEEDED. (Patient not taking: Reported on 07/15/2021) 30 tablet 2   No current facility-administered medications for this visit.    Allergies as of 07/21/2021 - Review Complete 07/21/2021  Allergen Reaction Noted   Penicillins Other (See Comments) 01/13/2014   Sulfa antibiotics Other (See Comments) 08/04/2013    Family History  Problem Relation Age of Onset   Diabetes Mother    Congestive  Heart Failure Mother    Stroke Mother    Osteoporosis Mother    Other Mother        gallbladder ruptured   Diabetes Brother    Dementia Brother    Diabetes Sister    Heart disease Sister    Colon cancer Brother        mets to liver   Heart disease Sister        x2   Hypertension Sister    Heart attack Father    Heart disease Brother        pacemaker   Stroke Brother    Thyroid disease Sister    Hypertension Sister    Liver cancer  Brother    Heart disease Brother     Social History   Socioeconomic History   Marital status: Married    Spouse name: Not on file   Number of children: 2   Years of education: Not on file   Highest education level: Not on file  Occupational History   Occupation: Glass blower/designer  Tobacco Use   Smoking status: Never    Passive exposure: Never   Smokeless tobacco: Never  Vaping Use   Vaping Use: Never used  Substance and Sexual Activity   Alcohol use: Yes    Alcohol/week: 2.0 standard drinks    Types: 2 Glasses of wine per week   Drug use: No   Sexual activity: Not Currently    Partners: Male  Other Topics Concern   Not on file  Social History Narrative   Not on file   Social Determinants of Health   Financial Resource Strain: Not on file  Food Insecurity: Not on file  Transportation Needs: Not on file  Physical Activity: Not on file  Stress: Not on file  Social Connections: Not on file  Intimate Partner Violence: Not on file    Review of Systems:    Constitutional: No weight loss, fever or chills Cardiovascular: No chest pain Respiratory: No SOB Gastrointestinal: See HPI and otherwise negative   Physical Exam:  Vital signs: BP 130/70 (BP Location: Left Arm, Patient Position: Sitting, Cuff Size: Normal)   Pulse 68   Ht 5\' 1"  (1.549 m)   Wt 125 lb 8 oz (56.9 kg)   LMP 10/16/2001   BMI 23.71 kg/m    Constitutional:   Pleasant Caucasian female appears to be in NAD, Well developed, Well nourished, alert and  cooperative Respiratory: Respirations even and unlabored. Lungs clear to auscultation bilaterally.   No wheezes, crackles, or rhonchi.  Cardiovascular: Normal S1, S2. No MRG. Regular rate and rhythm. No peripheral edema, cyanosis or pallor.  Gastrointestinal:  Soft, nondistended, nontender. No rebound or guarding.  Increased bowel sounds all 4 quadrants. No appreciable masses or hepatomegaly. Rectal:  Not performed.  Psychiatric: Oriented to person, place and time. Demonstrates good judgement and reason without abnormal affect or behaviors.  RELEVANT LABS AND IMAGING: CBC    Component Value Date/Time   WBC 7.7 08/28/2020 1231   RBC 3.90 08/28/2020 1231   HGB 13.6 08/28/2020 1231   HGB 12.3 01/13/2014 1656   HCT 41.6 08/28/2020 1231   PLT 184 08/28/2020 1231   MCV 106.7 (H) 08/28/2020 1231   MCH 34.9 (H) 08/28/2020 1231   MCHC 32.7 08/28/2020 1231   RDW 13.2 08/28/2020 1231   LYMPHSABS 0.8 04/12/2014 1659   MONOABS 0.3 04/12/2014 1659   EOSABS 0.0 04/12/2014 1659   BASOSABS 0.0 04/12/2014 1659    CMP     Component Value Date/Time   NA 141 06/29/2021 1425   K 3.8 06/29/2021 1425   CL 104 06/29/2021 1425   CO2 30 06/29/2021 1425   GLUCOSE 83 06/29/2021 1425   BUN 16 06/29/2021 1425   CREATININE 0.64 06/29/2021 1425   CREATININE 0.67 12/08/2014 0851   CALCIUM 8.9 06/29/2021 1425   PROT 6.0 (L) 08/28/2020 1231   ALBUMIN 4.2 08/28/2020 1231   AST 24 08/28/2020 1231   ALT 19 08/28/2020 1231   ALKPHOS 70 08/28/2020 1231   BILITOT 1.3 (H) 08/28/2020 1231   GFRNONAA >60 08/28/2020 1231   GFRAA 73 (L) 04/12/2014 1659    Assessment: 1.  Chronic diarrhea: Started over  a year ago after using strong antibiotics for a sinus infection, has had multiple stool studies, colonoscopy and other testing which have been negative, continues with diarrhea, no help from Dicyclomine 20 mg 4 times a day; consider most likely IBS cyclin D versus SIBO  Plan: 1.  Recommend the patient schedule  Robinul once daily for now.  If this is not helping over the next week would add a second dose in the evening. 2.  Patient will let us know how she is doing after another couple weeks.  If the Robinul really does not seem to be helping then would recommend treating with Xifaxan 550 mg 3 times daily x2 weeks for possible bacterial overgrowth as recommended by Dr. Silverio Decamp. 3.  Patient was arranged for follow-up with Dr. Silverio Decamp as she has not seen her in a while.  Would appreciate her opinion.  Ellouise Newer, PA-C [facility] 07/21/2021, 1:37 PM  Cc: Crist Infante, MD

## 2021-07-21 NOTE — Patient Instructions (Signed)
Schedule Robinul daily.  If you are age 69 or older, your body mass index should be between 23-30. Your Body mass index is 23.71 kg/m. If this is out of the aforementioned range listed, please consider follow up with your Primary Care Provider.  If you are age 14 or younger, your body mass index should be between 19-25. Your Body mass index is 23.71 kg/m. If this is out of the aformentioned range listed, please consider follow up with your Primary Care Provider.   __________________________________________________________  The [facility] providers would like to encourage you to use [facility] to communicate with providers for non-urgent requests or questions.  Due to long hold times on the telephone, sending your provider a message by [facility] may be a faster and more efficient way to get a response.  Please allow 48 business hours for a response.  Please remember that this is for non-urgent requests.

## 2021-07-22 NOTE — Progress Notes (Signed)
Reviewed and agree with documentation and assessment and plan. K. Veena Ameria Sanjurjo , MD   

## 2021-07-24 DIAGNOSIS — Z23 Encounter for immunization: Secondary | ICD-10-CM | POA: Diagnosis not present

## 2021-07-27 DIAGNOSIS — L821 Other seborrheic keratosis: Secondary | ICD-10-CM | POA: Diagnosis not present

## 2021-07-27 DIAGNOSIS — L570 Actinic keratosis: Secondary | ICD-10-CM | POA: Diagnosis not present

## 2021-07-27 DIAGNOSIS — L820 Inflamed seborrheic keratosis: Secondary | ICD-10-CM | POA: Diagnosis not present

## 2021-07-27 DIAGNOSIS — L853 Xerosis cutis: Secondary | ICD-10-CM | POA: Diagnosis not present

## 2021-08-15 ENCOUNTER — Ambulatory Visit: Payer: Medicare HMO | Admitting: Gastroenterology

## 2021-08-18 ENCOUNTER — Telehealth: Payer: Self-pay | Admitting: Physician Assistant

## 2021-08-18 MED ORDER — RIFAXIMIN 550 MG PO TABS: 550.0000 mg | ORAL_TABLET | Freq: Three times a day (TID) | ORAL | 0 refills | Status: AC

## 2021-08-18 NOTE — Telephone Encounter (Signed)
Telephone call  08/18/2021 8:57 AM  Patient requested telephone call after we suggested she start Xifaxan for possible SIBO given her ongoing diarrhea.  We discussed this medication in detail on the phone.  Explained that the stool test that she had done a few weeks back did not test for this small intestinal bacterial overgrowth.  Described the etiology and pathophysiology of this and how treating with antibiotics may help her symptoms.  Patient is going to go ahead and try the Xifaxan.  I will let my nurse know so she can start the process of trying to get this approved.  We briefly discussed her bloody stool, this occurred around 5 PM 2 days ago and she has seen none since, she did feel a lot of rectal pressure before she had this blood in her stool.  Reminds me she had a recent colonoscopy in December 2021 which was not done in our clinic.  Explained that likely this was hemorrhoids given that she felt the pressure and she had a recent normal colonoscopy and she has had a lot of diarrhea recently.  She does have follow-up shortly in our clinic.  Encouraged her to call us if she sees any more blood or does not stop bleeding.  Ellouise Newer, PA-C

## 2021-08-18 NOTE — Telephone Encounter (Signed)
Xifaxan prescription sent to [facility] along with records, demographic, and insurance information. Pt notified via my chart.

## 2021-08-18 NOTE — Telephone Encounter (Signed)
Pt notified of recommendations via mychart.

## 2021-08-18 NOTE — Telephone Encounter (Signed)
Ok, agree with empirically using Rifaximin 550mg  TID X14 days for IBS-D Use Preparation H suppository at Bedtime X 5 days and call if has presistent bleeding from hemorrhoids Thanks

## 2021-08-19 DIAGNOSIS — K7689 Other specified diseases of liver: Secondary | ICD-10-CM | POA: Diagnosis not present

## 2021-08-24 ENCOUNTER — Other Ambulatory Visit (INDEPENDENT_AMBULATORY_CARE_PROVIDER_SITE_OTHER): Payer: Medicare HMO

## 2021-08-24 ENCOUNTER — Encounter: Payer: Self-pay | Admitting: Gastroenterology

## 2021-08-24 ENCOUNTER — Ambulatory Visit: Payer: Medicare HMO | Admitting: Gastroenterology

## 2021-08-24 VITALS — BP 128/62 | HR 75 | Ht 61.0 in | Wt 123.0 lb

## 2021-08-24 DIAGNOSIS — K580 Irritable bowel syndrome with diarrhea: Secondary | ICD-10-CM

## 2021-08-24 DIAGNOSIS — K9089 Other intestinal malabsorption: Secondary | ICD-10-CM | POA: Diagnosis not present

## 2021-08-24 DIAGNOSIS — K529 Noninfective gastroenteritis and colitis, unspecified: Secondary | ICD-10-CM

## 2021-08-24 LAB — CBC WITH DIFFERENTIAL/PLATELET
Basophils Absolute: 0 10*3/uL (ref 0.0–0.1)
Basophils Relative: 0.5 % (ref 0.0–3.0)
Eosinophils Absolute: 0.1 10*3/uL (ref 0.0–0.7)
Eosinophils Relative: 1.6 % (ref 0.0–5.0)
HCT: 36.4 % (ref 36.0–46.0)
Hemoglobin: 12.3 g/dL (ref 12.0–15.0)
Lymphocytes Relative: 47.3 % — ABNORMAL HIGH (ref 12.0–46.0)
Lymphs Abs: 2.5 10*3/uL (ref 0.7–4.0)
MCHC: 33.8 g/dL (ref 30.0–36.0)
MCV: 99.9 fl (ref 78.0–100.0)
Monocytes Absolute: 0.4 10*3/uL (ref 0.1–1.0)
Monocytes Relative: 8.1 % (ref 3.0–12.0)
Neutro Abs: 2.3 10*3/uL (ref 1.4–7.7)
Neutrophils Relative %: 42.5 % — ABNORMAL LOW (ref 43.0–77.0)
Platelets: 126 10*3/uL — ABNORMAL LOW (ref 150.0–400.0)
RBC: 3.64 Mil/uL — ABNORMAL LOW (ref 3.87–5.11)
RDW: 14.5 % (ref 11.5–15.5)
WBC: 5.4 10*3/uL (ref 4.0–10.5)

## 2021-08-24 LAB — COMPREHENSIVE METABOLIC PANEL
ALT: 18 U/L (ref 0–35)
AST: 20 U/L (ref 0–37)
Albumin: 4.3 g/dL (ref 3.5–5.2)
Alkaline Phosphatase: 78 U/L (ref 39–117)
BUN: 19 mg/dL (ref 6–23)
CO2: 31 mEq/L (ref 19–32)
Calcium: 9.1 mg/dL (ref 8.4–10.5)
Chloride: 107 mEq/L (ref 96–112)
Creatinine, Ser: 0.58 mg/dL (ref 0.40–1.20)
GFR: 92.65 mL/min (ref >60.00)
Glucose, Bld: 76 mg/dL (ref 70–99)
Potassium: 4.4 mEq/L (ref 3.5–5.1)
Sodium: 143 mEq/L (ref 135–145)
Total Bilirubin: 1.3 mg/dL — ABNORMAL HIGH (ref 0.2–1.2)
Total Protein: 6 g/dL (ref 6.0–8.3)

## 2021-08-24 MED ORDER — CHOLESTYRAMINE LIGHT 4 G PO PACK: PACK | ORAL | 1 refills | Status: DC

## 2021-08-24 NOTE — Patient Instructions (Addendum)
Your provider has requested that you go to the basement level for lab work before leaving today. Press "B" on the elevator. The lab is located at the first door on the left as you exit the elevator.   You have been scheduled for a colonoscopy. Please follow written instructions given to you at your visit today.  Please pick up your prep supplies at the pharmacy within the next 1-3 days. If you use inhalers (even only as needed), please bring them with you on the day of your procedure.   We have given you a trial of CREON 72000units to take with meals three times a day for 2 days then stop (Samples)  If no change with CREON then start Prevalite 1/2 packet twice a day after lunch and dinner (AVOID) with-in 2-3 hours of other medications  If no improvement then then take Xifaxian   Follow up in 3 months   Due to recent changes in healthcare laws, you may see the results of your imaging and laboratory studies on MyChart before your provider has had a chance to review them.  We understand that in some cases there may be results that are confusing or concerning to you. Not all laboratory results come back in the same time frame and the provider may be waiting for multiple results in order to interpret others.  Please give [place] 48 hours in order for your provider to thoroughly review all the results before contacting the office for clarification of your results.    If you are age 68 or older, your body mass index should be between 23-30. Your Body mass index is 23.24 kg/m. If this is out of the aforementioned range listed, please consider follow up with your Primary Care Provider.  If you are age 44 or younger, your body mass index should be between 19-25. Your Body mass index is 23.24 kg/m. If this is out of the aformentioned range listed, please consider follow up with your Primary Care Provider.   ________________________________________________________  The [facility] providers would like to  encourage you to use [facility] to communicate with providers for non-urgent requests or questions.  Due to long hold times on the telephone, sending your provider a message by [facility] may be a faster and more efficient way to get a response.  Please allow 48 business hours for a response.  Please remember that this is for non-urgent requests.  _______________________________________________________   I appreciate the  opportunity to care for you  Thank You   Harl Bowie , MD

## 2021-08-24 NOTE — Progress Notes (Signed)
Brooke Whitaker    [id number]    07-Aug-1951  Primary Care Physician:Perini, Elta Guadeloupe, MD  Referring Physician: Crist Infante, [address] [place],  [place] [address]   Chief complaint:  Chronic diarrhea  HPI:  70 year old very pleasant female with complaints of  worsening chronic diarrhea and intermittent bright red blood per rectum   She was having chronic diarrhea last year since September 2021 but it had improved when she came in for her last office visit in April, she was doing better over the summer but since September she started having episodes of daily increased bowel frequency with semiformed to liquid stool, on average 3-10 bowel movements per day.  Occasionally she would have a bowel movement late in the night around 11:49 PM and on most days she has to wake up by 5 AM with fecal urgency  Occasionally she has bright red blood per rectum when she feels the pressure  Denies any episodes of constipation or hard stool. She is using Imodium as needed  No abdominal pain or cramping but has fecal urgency  Denies any recent change in diet or medications.  GI pathogen panel negative July 14, 2021  CT abdomen and pelvis July 06, 2021: 1. No acute abdominopelvic findings. 2. 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 3.  Aortic Atherosclerosis     CT angio abdomen pelvis with and without contrast August 28, 2020: Unremarkable   Colonoscopy with biopsies in December 2021 by Dr. Earlean Shawl negative for microscopic colitis  Colonoscopy August 18, 2014 by Dr. Earlean Shawl: 7 mm benign polyp removed from rectum  Family history positive for colon cancer in brother  Outpatient Encounter Medications as of 08/24/2021  Medication Sig   acetaminophen (TYLENOL) 500 MG tablet Take 500 mg by mouth every 6 (six) hours as needed for mild pain or headache.    cetirizine (ZYRTEC) 5 MG tablet Take 5 mg  by mouth daily as needed for allergies.   cholecalciferol (VITAMIN D) 1000 UNITS tablet Take 1,000 Units by mouth every morning.    ciclopirox (LOPROX) 0.77 % cream Apply 1 application topically daily.   Cyanocobalamin (VITAMIN B 12 PO) Take 1,000 mcg by mouth daily.   ELDERBERRY PO Take 1 tablet by mouth daily.   fluticasone (FLONASE) 50 MCG/ACT nasal spray Place into the nose as needed.   glycopyrrolate (ROBINUL) 2 MG tablet Take 1 tablet (2 mg total) by mouth 2 (two) times daily as needed (abdominal cramping). (Patient taking differently: Take 2 mg by mouth as needed (abdominal cramping).)   psyllium (REGULOID) 0.52 g capsule Take 0.52 g by mouth daily.   rosuvastatin (CRESTOR) 5 MG tablet Take 5 mg by mouth daily.   valACYclovir (VALTREX) 500 MG tablet TAKE 4 TABLETS BY MOUTH EVERY 12 HOURS X 1 DAY FOR FEVER BLISTER AS NEEDED.   rifaximin (XIFAXAN) 550 MG TABS tablet Take 1 tablet (550 mg total) by mouth 3 (three) times daily for 14 days. (Patient not taking: Reported on 08/24/2021)   [DISCONTINUED] dicyclomine (BENTYL) 10 MG capsule Take 1 capsule (10 mg total) by mouth 4 (four) times daily -  before meals and at bedtime. (Patient not taking: No sig reported)   No facility-administered encounter medications on file as of 08/24/2021.    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    Sigmoid diverticulosis    Wears glasses     Past Surgical History:  Procedure Laterality Date   BREAST BIOPSY Right 09/2011   benign    COLONOSCOPY  2010   CYSTOSCOPY/RETROGRADE/URETEROSCOPY/STONE EXTRACTION WITH BASKET Left 04/21/2014   Procedure: cystoscopy,  left retrograde pylegram, LEFT URETEROSCOPY, laser lithotripsy with holmium laser, STONE EXTRACTION, uretheral calibration;  Surgeon: Malka So, MD;  Location: [facility];  Service: Urology;  Laterality: Left;   [place], SEVERE TEAR  1989   LAPAROSCOPIC OVARIAN CYSTECTOMY  10/ 2000   NM MYOCAR PERF WALL MOTION  12/15/2009   Normal   US ECHOCARDIOGRAPHY  12/15/2009   Normal study for age: mild MR,TR and trace PI    Family History  Problem Relation Age of Onset   Diabetes Mother    Congestive Heart Failure Mother    Stroke Mother    Osteoporosis Mother    Other Mother        gallbladder ruptured   Heart attack Father    Diabetes Sister    Heart disease Sister    Heart disease Sister        x2   Hypertension Sister    Thyroid disease Sister    Hypertension Sister    Diabetes Brother    Dementia Brother    Colon cancer Brother        mets to liver   Heart disease Brother        pacemaker   Stroke Brother    Liver cancer Brother    Heart disease Brother    Stomach cancer Neg Hx    Pancreatic cancer Neg Hx    Esophageal cancer Neg Hx     Social History   Socioeconomic History   Marital status: Married    Spouse name: Not on file   Number of children: 2   Years of education: Not on file   Highest education level: Not on file  Occupational History   Occupation: Glass blower/designer  Tobacco Use   Smoking status: Never    Passive exposure: Never   Smokeless tobacco: Never  Vaping Use   Vaping Use: Never used  Substance and Sexual Activity   Alcohol use: Yes    Alcohol/week: 2.0 standard drinks    Types: 2 Glasses of wine per week   Drug use: No   Sexual activity: Not Currently    Partners: Male  Other Topics Concern   Not on file  Social History Narrative   Not on file   Social Determinants of Health   Financial Resource Strain: Not on file  Food Insecurity: Not on file  Transportation Needs: Not on file   Physical Activity: Not on file  Stress: Not on file  Social Connections: Not on file  Intimate Partner Violence: Not on file      Review of systems: All other review of systems negative except as mentioned in the HPI.   Physical Exam: Vitals:   08/24/21 1045  BP: 128/62  Pulse: 75  SpO2: 99%   Body mass index is 23.24 kg/m. Gen:      No acute distress HEENT:  sclera anicteric Abd:      soft, non-tender; no palpable masses, no distension Ext:    No edema Neuro: alert and oriented x 3 Psych: normal mood and affect  Data Reviewed:  Reviewed labs, radiology imaging, old records and pertinent past GI work up   Assessment and Plan/Recommendations:  70 year old very pleasant female with worsening chronic diarrhea, unclear etiology GI pathogen panel negative for acute infection Colonoscopy with biopsies in December 2021 by Dr. Earlean Shawl for evaluation of diarrhea was negative for microscopic colitis  Will do trial of Creon 72,000 units with meals and 36,000 units with snack X2 days for IBS diarrhea/exocrine pancreatic insufficiency  If has no significant improvement in her symptoms with Creon, will hold off calling in the prescription.  If she continues to have persistent diarrhea, advised patient to start using Prevalite half packet twice daily for possible bile salt induced diarrhea No reliable test is available clinically to evaluate for bile salt induced diarrhea  If no improvement, plan to proceed with Xifaxan 550 mg 3 times daily for 14 days for IBS-D/small intestinal bacterial overgrowth  Continue with increased dietary fiber  Continue with lactose-free diet Ok to use Imodium as needed Increased water intake to maintain hydration  If no improvement with empiric trials as mentioned above, will plan to repeat colonoscopy with biopsies for further evaluation The risks and benefits as well as alternatives of endoscopic procedure(s) have been discussed and reviewed. All  questions answered. The patient agrees to proceed.    This visit required >40 minutes of patient care (this includes precharting, chart review, review of results, face-to-face time used for counseling as well as treatment plan and follow-up. The patient was provided an opportunity to ask questions and all were answered. The patient agreed with the plan and demonstrated an understanding of the instructions.  Damaris Hippo , MD    CC: Crist Infante, MD

## 2021-09-01 ENCOUNTER — Encounter: Payer: Self-pay | Admitting: Gastroenterology

## 2021-09-02 NOTE — Telephone Encounter (Signed)
Duplicate message, see alternate patient message.

## 2021-09-05 ENCOUNTER — Other Ambulatory Visit: Payer: Self-pay | Admitting: Gastroenterology

## 2021-09-07 ENCOUNTER — Other Ambulatory Visit: Payer: Self-pay | Admitting: Gastroenterology

## 2021-09-09 DIAGNOSIS — R197 Diarrhea, unspecified: Secondary | ICD-10-CM | POA: Diagnosis not present

## 2021-09-15 ENCOUNTER — Encounter: Payer: Self-pay | Admitting: Gastroenterology

## 2021-09-23 ENCOUNTER — Ambulatory Visit: Payer: Medicare HMO | Admitting: Gastroenterology

## 2021-10-03 ENCOUNTER — Encounter: Payer: Self-pay | Admitting: Gastroenterology

## 2021-10-06 DIAGNOSIS — Z23 Encounter for immunization: Secondary | ICD-10-CM | POA: Diagnosis not present

## 2021-10-08 ENCOUNTER — Other Ambulatory Visit: Payer: Self-pay | Admitting: Gastroenterology

## 2021-10-10 ENCOUNTER — Encounter: Payer: Self-pay | Admitting: Gastroenterology

## 2021-10-13 ENCOUNTER — Encounter: Payer: Medicare HMO | Admitting: Gastroenterology

## 2021-10-21 ENCOUNTER — Ambulatory Visit: Payer: Medicare HMO | Admitting: Cardiovascular Disease

## 2021-11-03 DIAGNOSIS — L820 Inflamed seborrheic keratosis: Secondary | ICD-10-CM | POA: Diagnosis not present

## 2021-11-03 DIAGNOSIS — L570 Actinic keratosis: Secondary | ICD-10-CM | POA: Diagnosis not present

## 2021-12-21 DIAGNOSIS — Z1231 Encounter for screening mammogram for malignant neoplasm of breast: Secondary | ICD-10-CM | POA: Diagnosis not present

## 2021-12-26 DIAGNOSIS — E785 Hyperlipidemia, unspecified: Secondary | ICD-10-CM | POA: Diagnosis not present

## 2021-12-26 DIAGNOSIS — E559 Vitamin D deficiency, unspecified: Secondary | ICD-10-CM | POA: Diagnosis not present

## 2021-12-26 DIAGNOSIS — I700 Atherosclerosis of aorta: Secondary | ICD-10-CM | POA: Diagnosis not present

## 2021-12-26 DIAGNOSIS — E538 Deficiency of other specified B group vitamins: Secondary | ICD-10-CM | POA: Diagnosis not present

## 2022-01-02 DIAGNOSIS — Z1212 Encounter for screening for malignant neoplasm of rectum: Secondary | ICD-10-CM | POA: Diagnosis not present

## 2022-01-02 DIAGNOSIS — M8580 Other specified disorders of bone density and structure, unspecified site: Secondary | ICD-10-CM | POA: Diagnosis not present

## 2022-01-02 DIAGNOSIS — Z8249 Family history of ischemic heart disease and other diseases of the circulatory system: Secondary | ICD-10-CM | POA: Diagnosis not present

## 2022-01-02 DIAGNOSIS — I700 Atherosclerosis of aorta: Secondary | ICD-10-CM | POA: Diagnosis not present

## 2022-01-02 DIAGNOSIS — E559 Vitamin D deficiency, unspecified: Secondary | ICD-10-CM | POA: Diagnosis not present

## 2022-01-02 DIAGNOSIS — R82998 Other abnormal findings in urine: Secondary | ICD-10-CM | POA: Diagnosis not present

## 2022-01-02 DIAGNOSIS — K7689 Other specified diseases of liver: Secondary | ICD-10-CM | POA: Diagnosis not present

## 2022-01-02 DIAGNOSIS — Z0000 Encounter for general adult medical examination without abnormal findings: Secondary | ICD-10-CM | POA: Diagnosis not present

## 2022-01-02 DIAGNOSIS — H2513 Age-related nuclear cataract, bilateral: Secondary | ICD-10-CM | POA: Diagnosis not present

## 2022-01-02 DIAGNOSIS — E785 Hyperlipidemia, unspecified: Secondary | ICD-10-CM | POA: Diagnosis not present

## 2022-01-02 DIAGNOSIS — H5203 Hypermetropia, bilateral: Secondary | ICD-10-CM | POA: Diagnosis not present

## 2022-03-01 DIAGNOSIS — L820 Inflamed seborrheic keratosis: Secondary | ICD-10-CM | POA: Diagnosis not present

## 2022-03-01 DIAGNOSIS — L570 Actinic keratosis: Secondary | ICD-10-CM | POA: Diagnosis not present

## 2022-03-01 DIAGNOSIS — L821 Other seborrheic keratosis: Secondary | ICD-10-CM | POA: Diagnosis not present

## 2022-03-22 DIAGNOSIS — H43811 Vitreous degeneration, right eye: Secondary | ICD-10-CM | POA: Diagnosis not present

## 2022-04-26 DIAGNOSIS — H0014 Chalazion left upper eyelid: Secondary | ICD-10-CM | POA: Diagnosis not present

## 2022-04-28 DIAGNOSIS — R17 Unspecified jaundice: Secondary | ICD-10-CM | POA: Diagnosis not present

## 2022-05-09 DIAGNOSIS — H43811 Vitreous degeneration, right eye: Secondary | ICD-10-CM | POA: Diagnosis not present

## 2022-05-09 DIAGNOSIS — H0014 Chalazion left upper eyelid: Secondary | ICD-10-CM | POA: Diagnosis not present

## 2022-05-18 ENCOUNTER — Other Ambulatory Visit: Payer: Self-pay | Admitting: Gastroenterology

## 2022-05-18 DIAGNOSIS — R17 Unspecified jaundice: Secondary | ICD-10-CM

## 2022-06-04 ENCOUNTER — Ambulatory Visit
Admission: RE | Admit: 2022-06-04 | Discharge: 2022-06-04 | Disposition: A | Discharge status: Home / Self Care | Payer: Medicare HMO | Source: Ambulatory Visit

## 2022-06-04 DIAGNOSIS — R17 Unspecified jaundice: Secondary | ICD-10-CM

## 2022-06-04 DIAGNOSIS — D1771 Benign lipomatous neoplasm of kidney: Secondary | ICD-10-CM | POA: Diagnosis not present

## 2022-06-04 DIAGNOSIS — K828 Other specified diseases of gallbladder: Secondary | ICD-10-CM | POA: Diagnosis not present

## 2022-06-04 MED ORDER — GADOBENATE DIMEGLUMINE 529 MG/ML IV SOLN
11.0000 mL | Freq: Once | INTRAVENOUS | Status: AC | PRN
  Administered 2022-06-04: 11 mL via INTRAVENOUS

## 2022-06-06 DIAGNOSIS — L820 Inflamed seborrheic keratosis: Secondary | ICD-10-CM | POA: Diagnosis not present

## 2022-06-06 DIAGNOSIS — L570 Actinic keratosis: Secondary | ICD-10-CM | POA: Diagnosis not present

## 2022-07-25 DIAGNOSIS — H0011 Chalazion right upper eyelid: Secondary | ICD-10-CM | POA: Diagnosis not present

## 2022-08-29 DIAGNOSIS — Z23 Encounter for immunization: Secondary | ICD-10-CM | POA: Diagnosis not present

## 2022-10-14 IMAGING — CT CT CARDIAC CORONARY ARTERY CALCIUM SCORE
3 series · 14 of 20 positions shown, 16 images · non-contrast
Comparison: None.

CLINICAL DATA: Hyperlipidemia

EXAM:
CT CARDIAC CORONARY ARTERY CALCIUM SCORE
TECHNIQUE: Non-contrast imaging through the heart was performed using
prospective ECG gating. Image post processing was performed on an
independent workstation, allowing for quantitative analysis of the
heart and coronary arteries. Note that this exam targets the heart
and the chest was not imaged in its entirety.

[Series 2: calcium scoring 2.00 qr36 bestdiast 70% hrt calciu · axial · 0.34mm/px · z∈[+1733,+1805]mm · 4 of 60 slices shown]
[im 12/60  vessel]
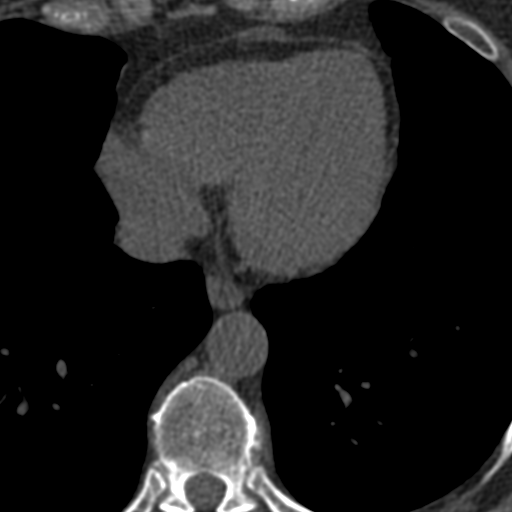
[im 24/60  vessel]
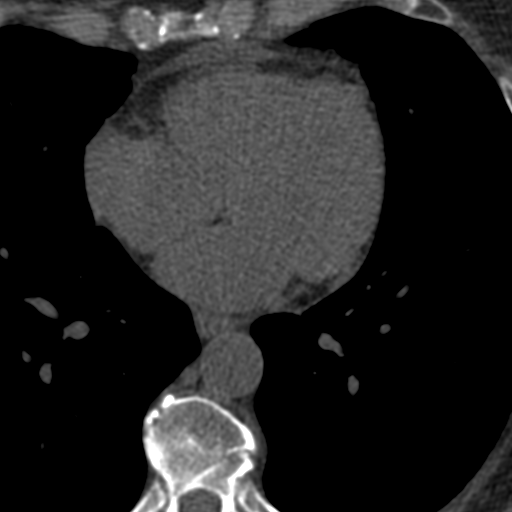
[im 36/60  vessel]
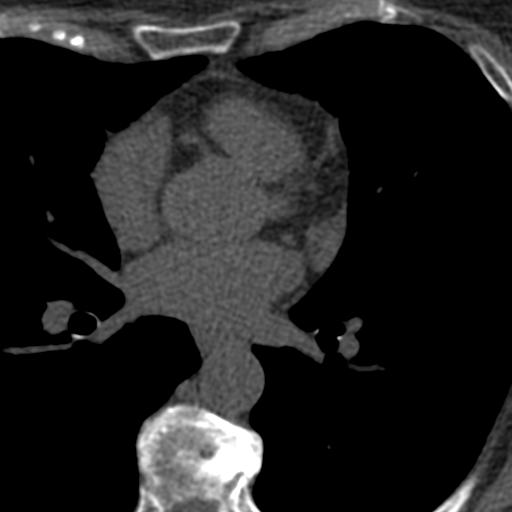
[im 48/60  vessel]
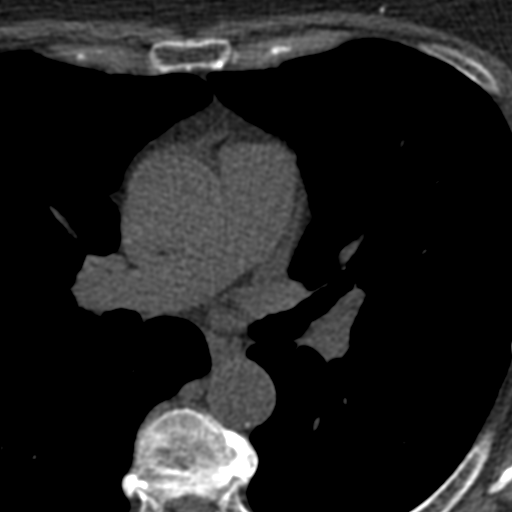

[Series 3: calcium scoring 2.00 br40 bestdiast 70% axial · axial · 0.53mm/px · z∈[+1729,+1809]mm · 5 of 60 slices shown, 7 images]
[im 10/60  vessel]
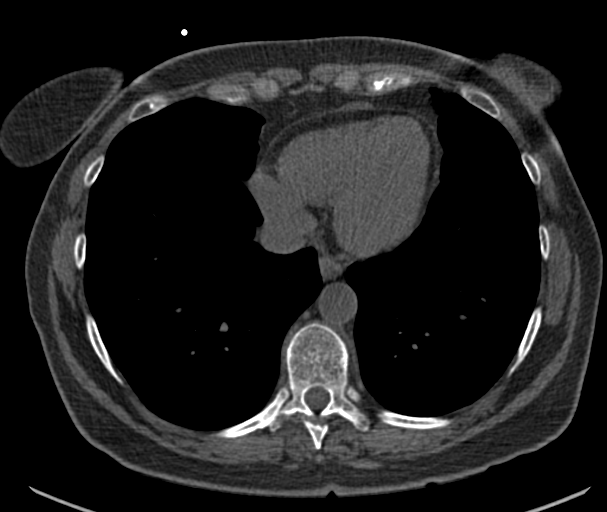
[im 10/60  lung]
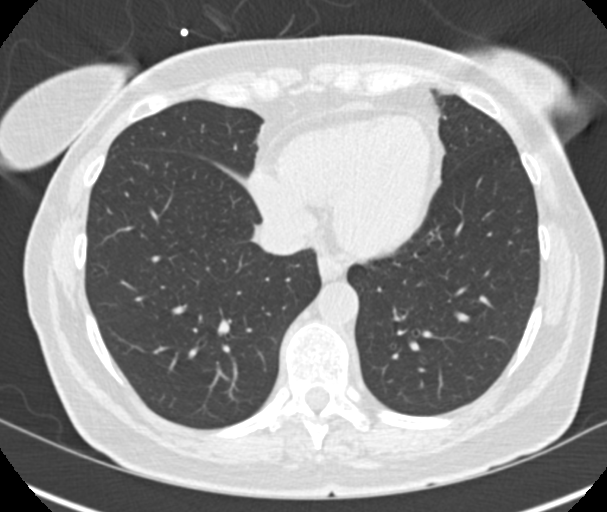
[im 20/60  vessel]
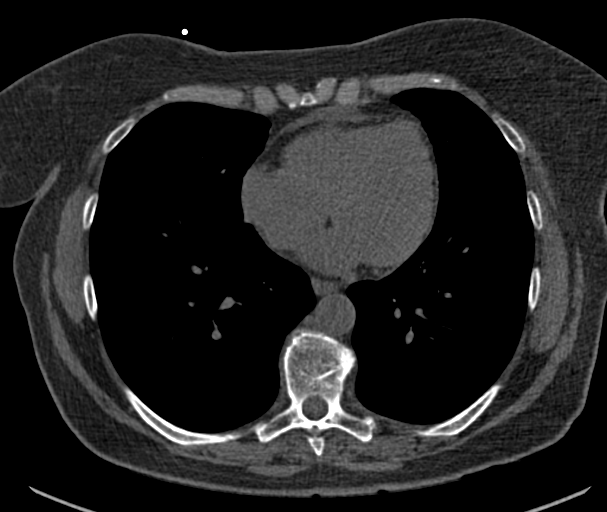
[im 30/60  vessel]
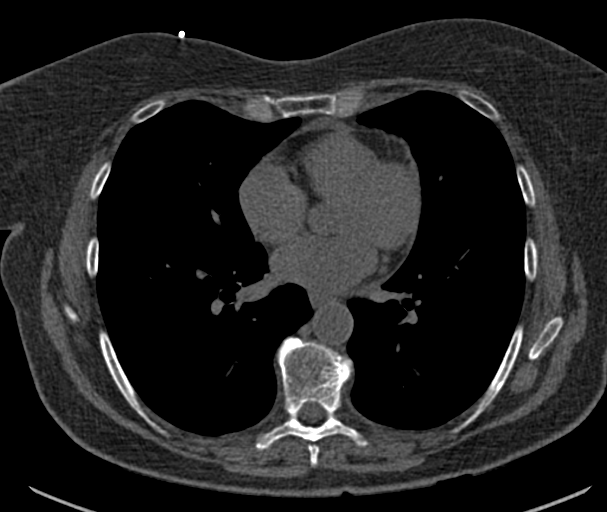
[im 40/60  vessel]
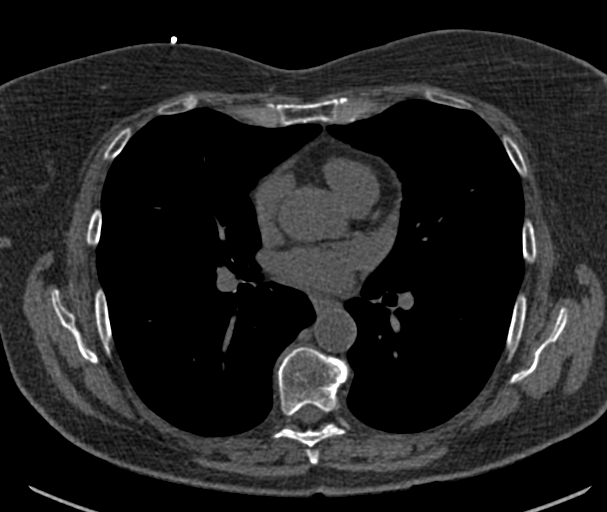
[im 50/60  vessel]
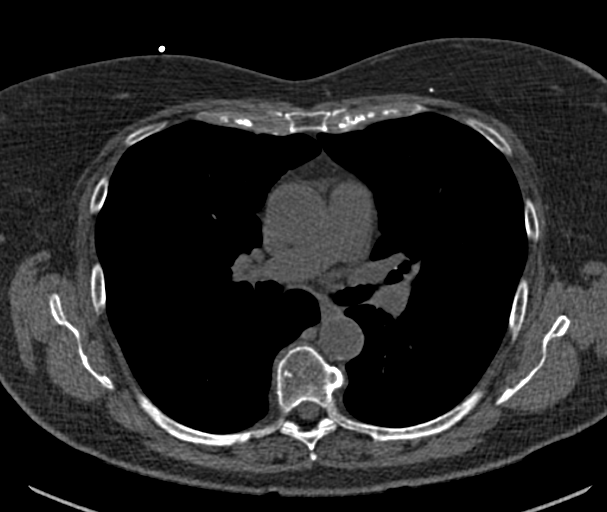
[im 50/60  lung]
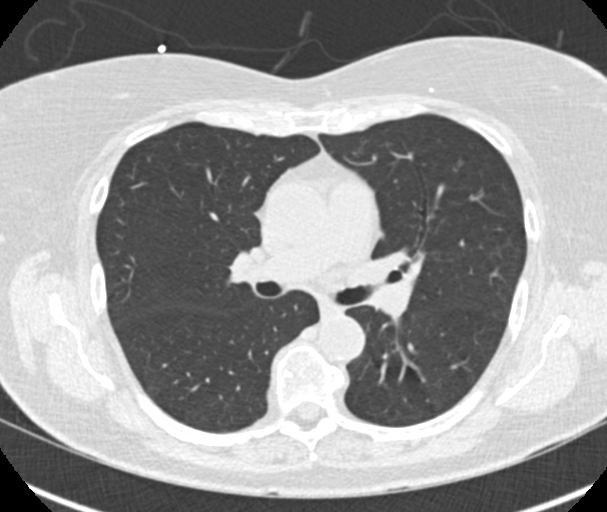

[Series 9: calcium scoring 2.00 br60 bestdiast 70% lungs · axial · 0.53mm/px · z∈[+1729,+1809]mm · 5 of 60 slices shown]
[im 10/60  vessel]
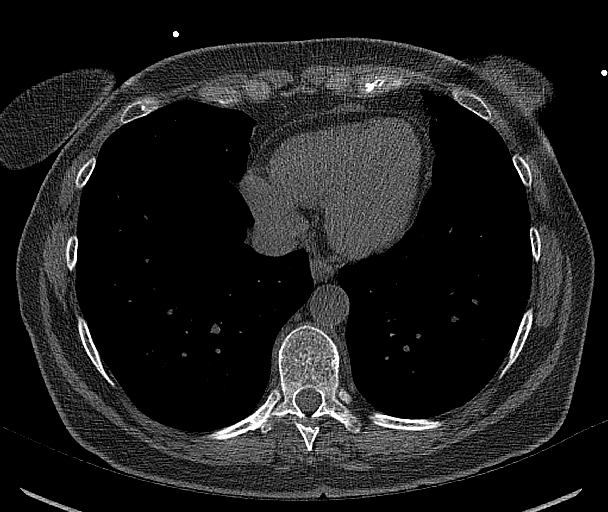
[im 20/60  vessel]
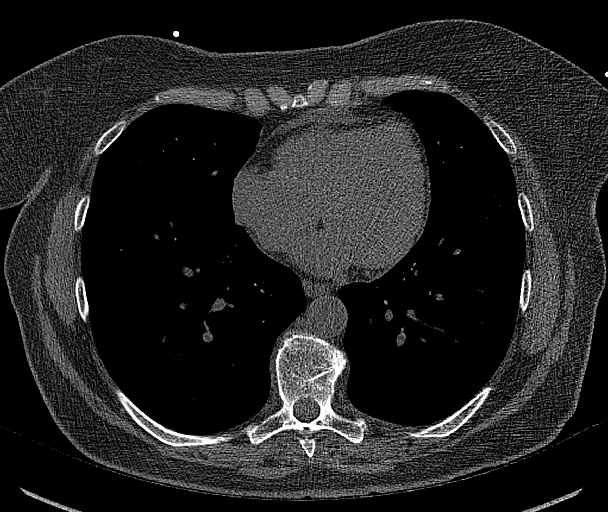
[im 30/60  vessel]
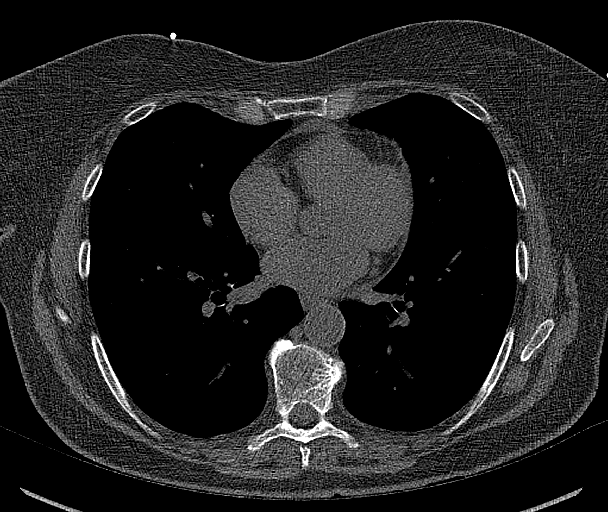
[im 40/60  vessel]
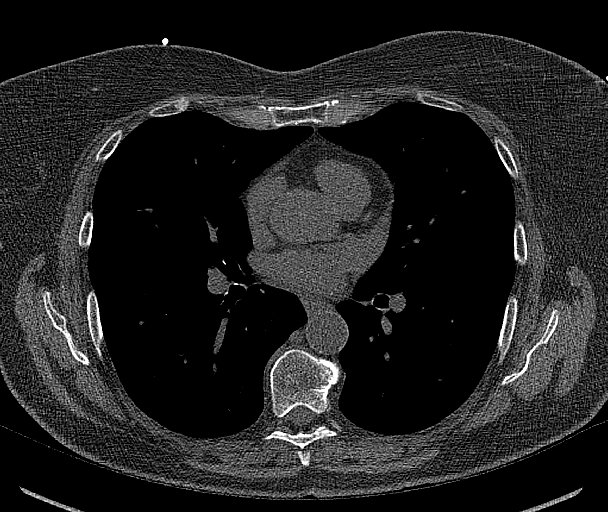
[im 50/60  vessel]
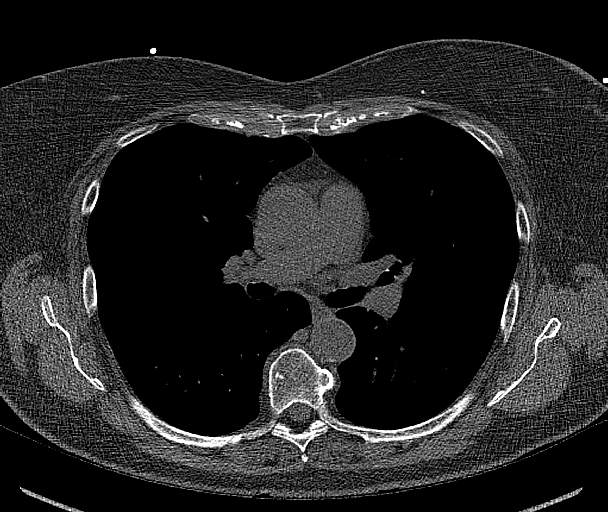

[14 of 20 positions shown; findings below may reference images not displayed]

FINDINGS: CORONARY CALCIUM SCORES:

Left Main: 0

LAD: 0

LCx: 0

RCA: 0

Total Agatston Score: 0

[HOSPITAL] percentile: 0

AORTA MEASUREMENTS:

Ascending Aorta: 29 mm

Descending Aorta: 23 mm

OTHER FINDINGS:

Heart is normal size. Aorta normal caliber. Scattered punctate
calcifications in the descending thoracic aorta. No adenopathy. No
confluent opacities or effusions. 2.9 cm low-density lesion in the
right hepatic dome, likely cyst. Chest wall soft tissues are
unremarkable. No acute bony abnormality.
IMPRESSION: No visible coronary artery calcifications. Total coronary calcium
score of 0.

Scattered aortic calcifications.

2.9 cm cyst in the right hepatic dome.

## 2022-10-31 IMAGING — US US ABDOMEN LIMITED RUQ/ASCITES
1 series · 14 of 25 positions shown · non-contrast
Comparison: Abdominal CT 07/01/2020 and 08/28/2020

CLINICAL DATA: Hyperbilirubinemia.

EXAM:
ULTRASOUND ABDOMEN LIMITED RIGHT UPPER QUADRANT

[Series 1: us abdomen limited ruq/ascites · 0.15mm/px · 14 of 68 slices shown]
[im 1/68]
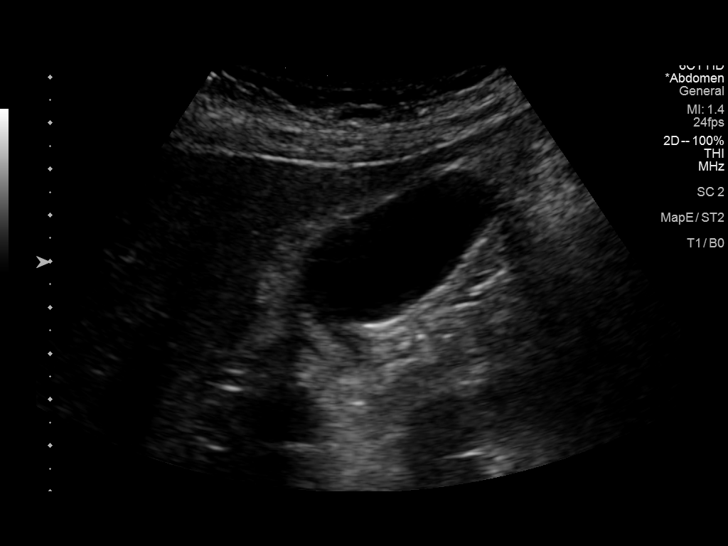
[im 6/68]
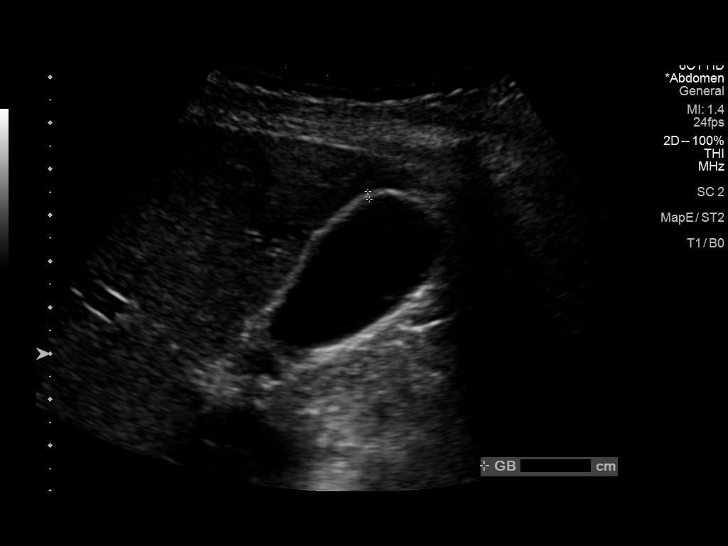
[im 12/68]
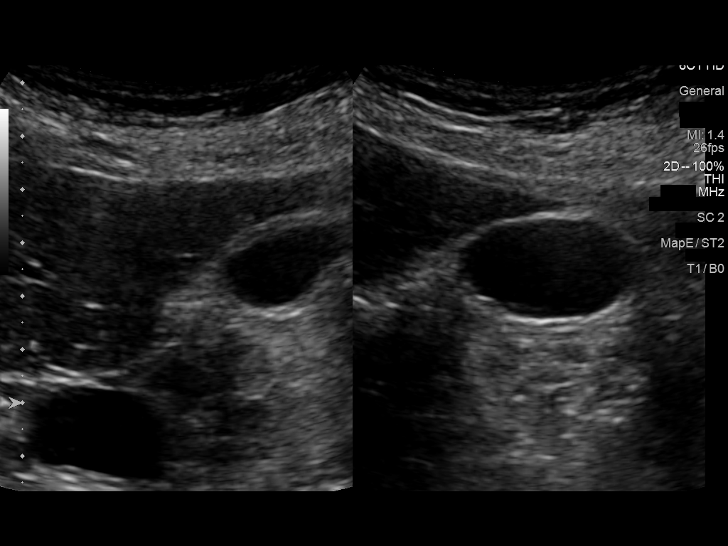
[im 17/68]
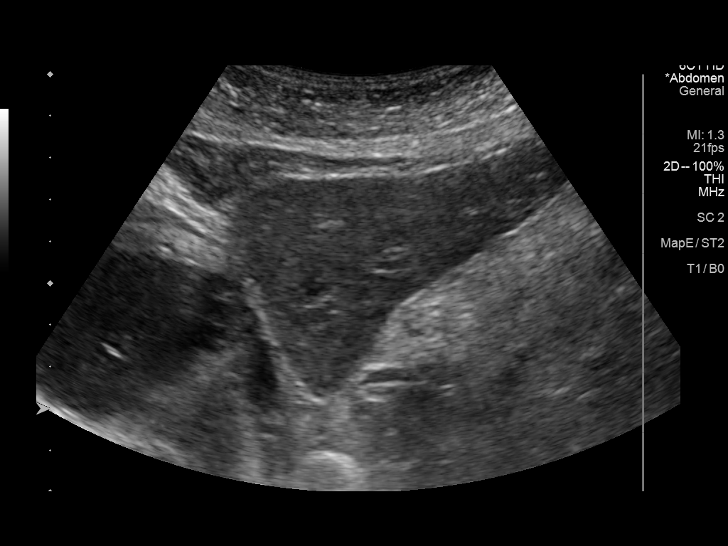
[im 23/68]
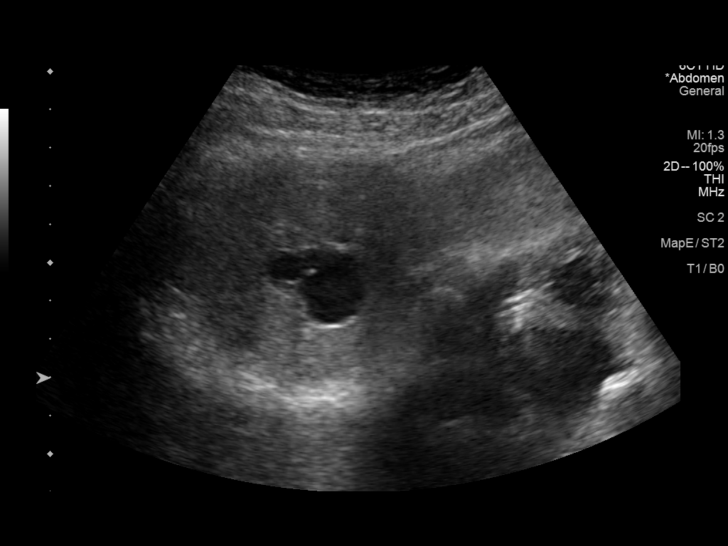
[im 26/68]
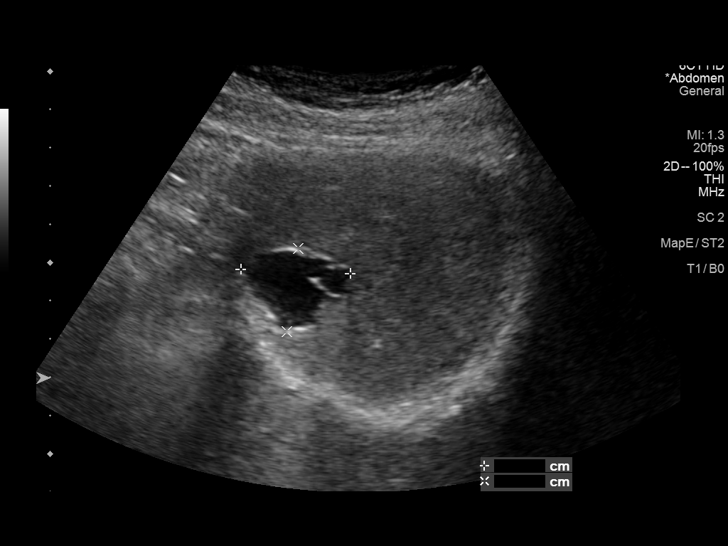
[im 31/68]
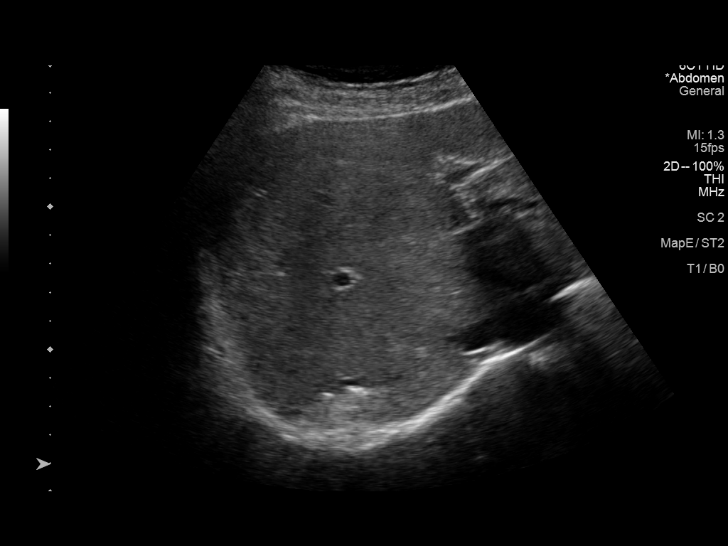
[im 37/68]
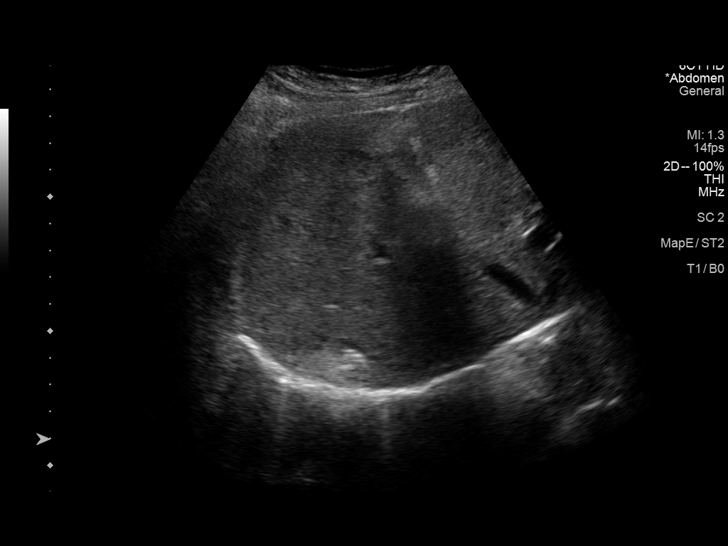
[im 42/68]
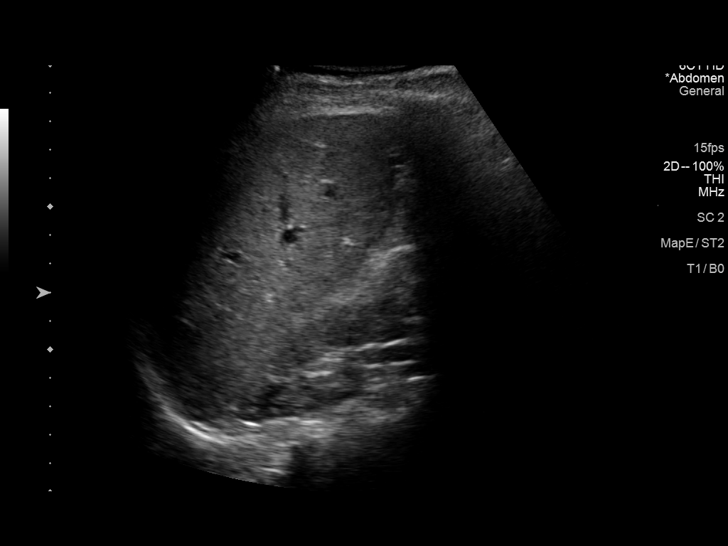
[im 45/68]
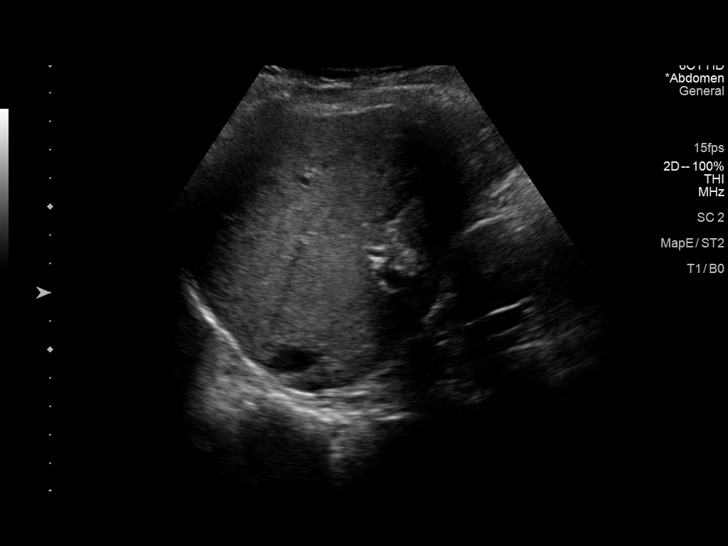
[im 51/68]
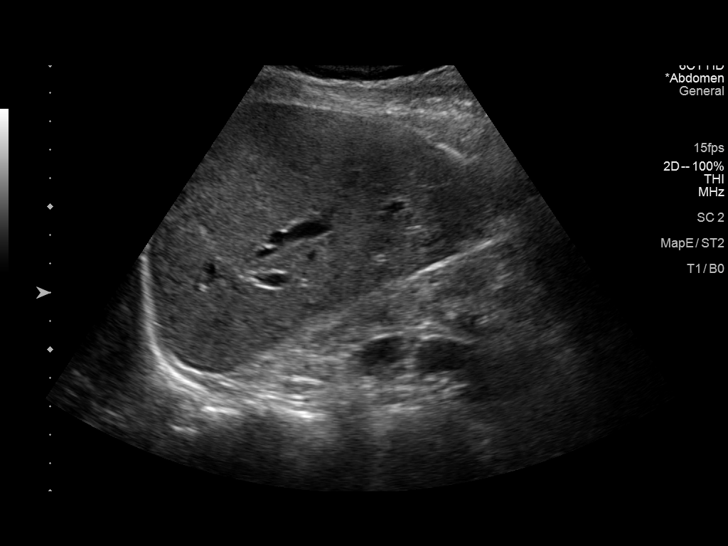
[im 56/68]
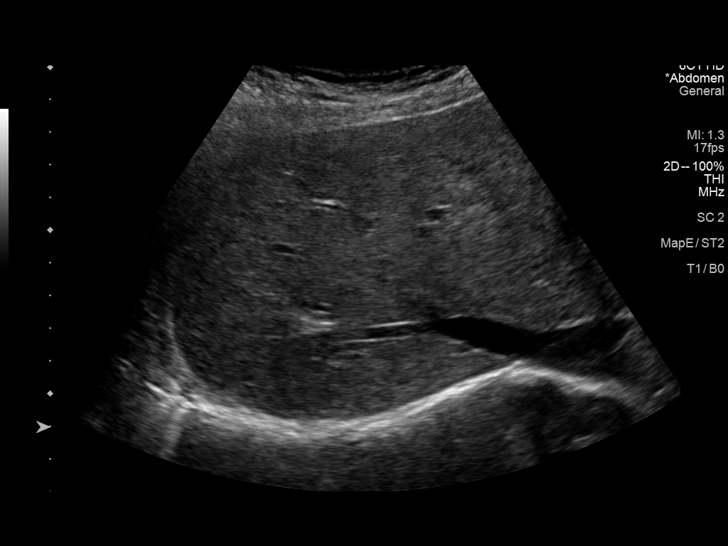
[im 62/68]
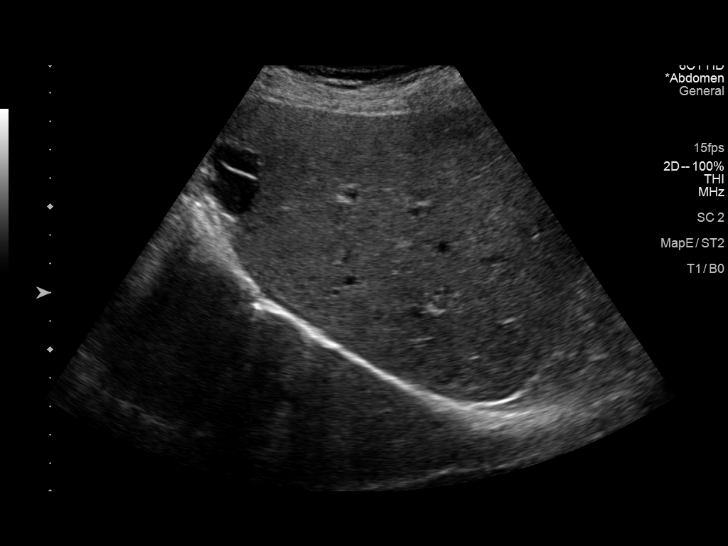
[im 68/68]
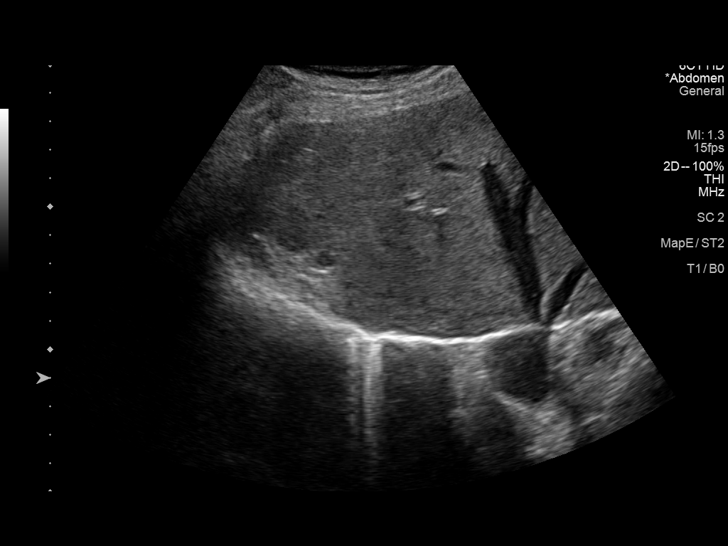

[14 of 25 positions shown; findings below may reference images not displayed]

FINDINGS: Gallbladder:

No gallstones or wall thickening visualized. No sonographic Murphy
sign noted by sonographer.

Common bile duct:

Diameter: 4 mm

Liver:

There is a septated cyst anteriorly in the dome of the right hepatic
lobe which measures 2.9 x 2.2 x 3.0 cm. There is an echogenic
subcapsular lesion posteriorly in the right lobe which measures
x 2.6 x 1.5 cm. This is likely a hemangioma based on recent CT,
although does not clearly the same lesion seen by remote ultrasound
of 3363. The portal vein is patent on color Doppler imaging with
normal direction of blood flow towards the liver.

Other: None.
IMPRESSION: 1. No acute findings or explanation for the patient's symptoms.
2. Grossly stable septated hepatic cyst and probable hemangioma.

## 2022-11-02 DIAGNOSIS — J029 Acute pharyngitis, unspecified: Secondary | ICD-10-CM | POA: Diagnosis not present

## 2022-11-02 DIAGNOSIS — J069 Acute upper respiratory infection, unspecified: Secondary | ICD-10-CM | POA: Diagnosis not present

## 2022-11-02 DIAGNOSIS — R051 Acute cough: Secondary | ICD-10-CM | POA: Diagnosis not present

## 2022-11-02 DIAGNOSIS — G43909 Migraine, unspecified, not intractable, without status migrainosus: Secondary | ICD-10-CM | POA: Diagnosis not present

## 2022-11-02 DIAGNOSIS — Z1152 Encounter for screening for COVID-19: Secondary | ICD-10-CM | POA: Diagnosis not present

## 2022-11-10 DIAGNOSIS — R030 Elevated blood-pressure reading, without diagnosis of hypertension: Secondary | ICD-10-CM | POA: Diagnosis not present

## 2022-11-10 DIAGNOSIS — Z1152 Encounter for screening for COVID-19: Secondary | ICD-10-CM | POA: Diagnosis not present

## 2022-11-10 DIAGNOSIS — R5383 Other fatigue: Secondary | ICD-10-CM | POA: Diagnosis not present

## 2022-11-10 DIAGNOSIS — R519 Headache, unspecified: Secondary | ICD-10-CM | POA: Diagnosis not present

## 2022-11-10 DIAGNOSIS — R002 Palpitations: Secondary | ICD-10-CM | POA: Diagnosis not present

## 2022-11-10 DIAGNOSIS — J029 Acute pharyngitis, unspecified: Secondary | ICD-10-CM | POA: Diagnosis not present

## 2022-11-10 DIAGNOSIS — D649 Anemia, unspecified: Secondary | ICD-10-CM | POA: Diagnosis not present

## 2023-03-22 DIAGNOSIS — Z1231 Encounter for screening mammogram for malignant neoplasm of breast: Secondary | ICD-10-CM | POA: Diagnosis not present

## 2023-04-17 DIAGNOSIS — E538 Deficiency of other specified B group vitamins: Secondary | ICD-10-CM | POA: Diagnosis not present

## 2023-04-17 DIAGNOSIS — E785 Hyperlipidemia, unspecified: Secondary | ICD-10-CM | POA: Diagnosis not present

## 2023-04-17 DIAGNOSIS — E559 Vitamin D deficiency, unspecified: Secondary | ICD-10-CM | POA: Diagnosis not present

## 2023-04-17 DIAGNOSIS — M8580 Other specified disorders of bone density and structure, unspecified site: Secondary | ICD-10-CM | POA: Diagnosis not present

## 2023-04-17 DIAGNOSIS — I700 Atherosclerosis of aorta: Secondary | ICD-10-CM | POA: Diagnosis not present

## 2023-04-17 DIAGNOSIS — M8589 Other specified disorders of bone density and structure, multiple sites: Secondary | ICD-10-CM | POA: Diagnosis not present

## 2023-04-17 DIAGNOSIS — Z1212 Encounter for screening for malignant neoplasm of rectum: Secondary | ICD-10-CM | POA: Diagnosis not present

## 2023-04-17 DIAGNOSIS — D649 Anemia, unspecified: Secondary | ICD-10-CM | POA: Diagnosis not present

## 2023-04-23 DIAGNOSIS — D692 Other nonthrombocytopenic purpura: Secondary | ICD-10-CM | POA: Diagnosis not present

## 2023-04-23 DIAGNOSIS — L820 Inflamed seborrheic keratosis: Secondary | ICD-10-CM | POA: Diagnosis not present

## 2023-04-24 DIAGNOSIS — Z780 Asymptomatic menopausal state: Secondary | ICD-10-CM | POA: Diagnosis not present

## 2023-04-24 DIAGNOSIS — R002 Palpitations: Secondary | ICD-10-CM | POA: Diagnosis not present

## 2023-04-24 DIAGNOSIS — Z0000 Encounter for general adult medical examination without abnormal findings: Secondary | ICD-10-CM | POA: Diagnosis not present

## 2023-04-24 DIAGNOSIS — E785 Hyperlipidemia, unspecified: Secondary | ICD-10-CM | POA: Diagnosis not present

## 2023-04-24 DIAGNOSIS — J302 Other seasonal allergic rhinitis: Secondary | ICD-10-CM | POA: Diagnosis not present

## 2023-04-24 DIAGNOSIS — M1990 Unspecified osteoarthritis, unspecified site: Secondary | ICD-10-CM | POA: Diagnosis not present

## 2023-04-24 DIAGNOSIS — Z8249 Family history of ischemic heart disease and other diseases of the circulatory system: Secondary | ICD-10-CM | POA: Diagnosis not present

## 2023-04-24 DIAGNOSIS — G4700 Insomnia, unspecified: Secondary | ICD-10-CM | POA: Diagnosis not present

## 2023-04-24 DIAGNOSIS — M8580 Other specified disorders of bone density and structure, unspecified site: Secondary | ICD-10-CM | POA: Diagnosis not present

## 2023-04-24 DIAGNOSIS — D7589 Other specified diseases of blood and blood-forming organs: Secondary | ICD-10-CM | POA: Diagnosis not present

## 2023-04-24 DIAGNOSIS — I700 Atherosclerosis of aorta: Secondary | ICD-10-CM | POA: Diagnosis not present

## 2023-04-24 DIAGNOSIS — R82998 Other abnormal findings in urine: Secondary | ICD-10-CM | POA: Diagnosis not present

## 2023-04-24 DIAGNOSIS — Z800 Family history of malignant neoplasm of digestive organs: Secondary | ICD-10-CM | POA: Diagnosis not present

## 2023-05-07 DIAGNOSIS — T148XXA Other injury of unspecified body region, initial encounter: Secondary | ICD-10-CM | POA: Diagnosis not present

## 2023-05-07 DIAGNOSIS — I83899 Varicose veins of unspecified lower extremities with other complications: Secondary | ICD-10-CM | POA: Diagnosis not present

## 2023-05-15 NOTE — Progress Notes (Unsigned)
[facility] Telephone:([phone]   [phone]  INITIAL CONSULT NOTE  Patient Care Team: Rodrigo Ran, MD as PCP - General (Internal Medicine)  Hematological/Oncological History # Macrocytic Anemia # Thrombocytopenia 11/10/2022: WBC 5.68, Hgb 11.7, MCV 97.4, Plt 146 04/17/2023: WBC 4.53, Hgb 11.5, MCV 107.6, Plt 110 05/16/2023: establish care with Dr. Leonides Schanz   CHIEF COMPLAINTS/PURPOSE OF CONSULTATION:  "Bicytopenia"  HISTORY OF PRESENTING ILLNESS:  Brooke Whitaker 72 y.o. female with medical history significant for arthritis, hyperlipidemia, osteopenia, and diverticulosis who presents for evaluation of macrocytic anemia and thrombocytopenia.  On review of the previous records the patient had labs drawn on 11/10/2022 which revealed WBC 5.68, Hgb 11.7, MCV 97.4, Plt 146.  Most recently on 04/17/2023 patient was noted to have WBC 4.53, Hgb 11.5, MCV 107.6, Plt 110.  Our records show her anemia dates back to 04/03/2014 at which time she had a hemoglobin 11.5 with an MCV 100.3 as well as a thrombocytopenia dating back to at least 04/12/2014.  Due to concern for these findings the patient was referred to hematology for further evaluation and management.  On exam today Brooke Whitaker reports that she has had issues with low red blood cell counts and platelets over the last year.  She notes that it has been downtrending.  She reports that she had had intestinal issues for years and was under evaluation by gastroenterology but the root of the issue was not found.  There was concern that maybe she had SIBO and was treated with antibiotic therapy.  She notes that she has not recently had any issues with bleeding, bruising, or dark stools.  She notes that she does have a little bit of blood when she blows her nose on occasion.  She reports that she does eat red meat approximately once per week.  Her energy levels are currently about a 6 out of 10.  She is not having any other infectious  symptoms such as fevers, chills, sweats, nausea, vomiting or diarrhea.  On further discussion the patient reports that her mother had a CVA and her father had a heart attack.  She had a brother with colon cancer who was exposed to agent orange.  She has 2 sisters with heart disease and 2 healthy children.  She notes that she is is a never smoker and drinks alcohol about 2 to 3 glasses of wine per night.  She does not drink any beer or liquor.  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  She otherwise denies any infectious symptoms such as runny nose, sore throat, or cough.  She denies any lightheadedness, dizziness, shortness of breath.  A full 10 point ROS is otherwise negative.  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    Sigmoid diverticulosis    Wears glasses     SURGICAL HISTORY: Past Surgical History:  Procedure Laterality Date   BREAST BIOPSY Right 09/2011   benign    COLONOSCOPY  2010   CYSTOSCOPY/RETROGRADE/URETEROSCOPY/STONE EXTRACTION WITH BASKET Left 04/21/2014   Procedure: cystoscopy, left retrograde pylegram, LEFT URETEROSCOPY, laser lithotripsy with holmium laser, STONE EXTRACTION, uretheral calibration;  Surgeon: Anner Crete, MD;  Location: [facility];  Service: Urology;  Laterality:  Left;   DILATION AND EVACUATION  1995   EPISIOTOMY REPAIR, SEVERE TEAR  1989   LAPAROSCOPIC OVARIAN CYSTECTOMY  10/ 2000   NM MYOCAR PERF WALL MOTION  12/15/2009   Normal   US ECHOCARDIOGRAPHY  12/15/2009   Normal study for age: mild MR,TR and trace PI    SOCIAL HISTORY: Social History   Socioeconomic History   Marital status: Married    Spouse name: Not on file   Number of children: 2   Years of education: Not on file   Highest education level: Not  on file  Occupational History   Occupation: Print production planner  Tobacco Use   Smoking status: Never    Passive exposure: Never   Smokeless tobacco: Never  Vaping Use   Vaping status: Never Used  Substance and Sexual Activity   Alcohol use: Yes    Alcohol/week: 2.0 standard drinks of alcohol    Types: 2 Glasses of wine per week   Drug use: No   Sexual activity: Not Currently    Partners: Male  Other Topics Concern   Not on file  Social History Narrative   Not on file   Social Determinants of Health   Financial Resource Strain: Not on file  Food Insecurity: No Food Insecurity (05/16/2023)   Hunger Vital Sign    Worried About Running Out of Food in the Last Year: Never true    Ran Out of Food in the Last Year: Never true  Transportation Needs: No Transportation Needs (05/16/2023)   PRAPARE - Administrator, Civil Service (Medical): No    Lack of Transportation (Non-Medical): No  Physical Activity: Not on file  Stress: Not on file  Social Connections: Not on file  Intimate Partner Violence: Not At Risk (05/16/2023)   Humiliation, Afraid, Rape, and Kick questionnaire    Fear of Current or Ex-Partner: No    Emotionally Abused: No    Physically Abused: No    Sexually Abused: No    FAMILY HISTORY: Family History  Problem Relation Age of Onset   Diabetes Mother    Congestive Heart Failure Mother    Stroke Mother    Osteoporosis Mother    Other Mother        gallbladder ruptured   Heart attack Father    Diabetes Sister    Heart disease Sister    Heart disease Sister        x2   Hypertension Sister    Thyroid disease Sister    Hypertension Sister    Diabetes Brother    Dementia Brother    Colon cancer Brother        mets to liver   Heart disease Brother        pacemaker   Stroke Brother    Liver cancer Brother    Heart disease Brother    Stomach cancer Neg Hx    Pancreatic cancer Neg Hx    Esophageal cancer Neg Hx     ALLERGIES:  is allergic to  penicillins and sulfa antibiotics.  MEDICATIONS:  Current Outpatient Medications  Medication Sig Dispense Refill   acetaminophen (TYLENOL) 500 MG tablet Take 500 mg by mouth every 6 (six) hours as needed for mild pain or headache.      cetirizine (ZYRTEC) 5 MG tablet Take 5 mg by mouth daily as needed for allergies.     cholecalciferol (VITAMIN D) 1000 UNITS tablet Take 1,000 Units by mouth every morning.  Cyanocobalamin (VITAMIN B 12 PO) Take 1,000 mcg by mouth daily.     ELDERBERRY PO Take 1 tablet by mouth daily.     fluticasone (FLONASE) 50 MCG/ACT nasal spray Place into the nose as needed.     rosuvastatin (CRESTOR) 5 MG tablet Take 5 mg by mouth daily.     valACYclovir (VALTREX) 500 MG tablet TAKE 4 TABLETS BY MOUTH EVERY 12 HOURS X 1 DAY FOR FEVER BLISTER AS NEEDED. 30 tablet 2   No current facility-administered medications for this visit.    REVIEW OF SYSTEMS:   Constitutional: ( - ) fevers, ( - )  chills , ( - ) night sweats Eyes: ( - ) blurriness of vision, ( - ) double vision, ( - ) watery eyes Ears, nose, mouth, throat, and face: ( - ) mucositis, ( - ) sore throat Respiratory: ( - ) cough, ( - ) dyspnea, ( - ) wheezes Cardiovascular: ( - ) palpitation, ( - ) chest discomfort, ( - ) lower extremity swelling Gastrointestinal:  ( - ) nausea, ( - ) heartburn, ( - ) change in bowel habits Skin: ( - ) abnormal skin rashes Lymphatics: ( - ) new lymphadenopathy, ( - ) easy bruising Neurological: ( - ) numbness, ( - ) tingling, ( - ) new weaknesses Behavioral/Psych: ( - ) mood change, ( - ) new changes  All other systems were reviewed with the patient and are negative.  PHYSICAL EXAMINATION:  Vitals:   05/16/23 0918  BP: (!) 128/54  Pulse: 76  Resp: 14  Temp: 97.6 F (36.4 C)  SpO2: 99%   Filed Weights   05/16/23 0918  Weight: 134 lb 3.2 oz (60.9 kg)    GENERAL: well appearing elderly Caucasian female in NAD  SKIN: skin color, texture, turgor are normal, no  rashes or significant lesions EYES: conjunctiva are pink and non-injected, sclera clear LUNGS: clear to auscultation and percussion with normal breathing effort HEART: regular rate & rhythm and no murmurs and no lower extremity edema Musculoskeletal: no cyanosis of digits and no clubbing  PSYCH: alert & oriented x 3, fluent speech NEURO: no focal motor/sensory deficits  LABORATORY DATA:  I have reviewed the data as listed    Latest Ref Rng & Units 05/16/2023   11:01 AM 08/24/2021   11:58 AM 08/28/2020   12:31 PM  CBC  WBC 4.0 - 10.5 K/uL 4.1  5.4  7.7   Hemoglobin 12.0 - 15.0 g/dL 16.1  09.6  04.5   Hematocrit 36.0 - 46.0 % 33.6  36.4  41.6   Platelets 150 - 400 K/uL 108  126.0  184        Latest Ref Rng & Units 05/16/2023   11:01 AM 08/24/2021   11:58 AM 06/29/2021    2:25 PM  CMP  Glucose 70 - 99 mg/dL 87  76  83   BUN 8 - 23 mg/dL 21  19  16    Creatinine 0.44 - 1.00 mg/dL 4.09  8.11  9.14   Sodium 135 - 145 mmol/L 142  143  141   Potassium 3.5 - 5.1 mmol/L 3.8  4.4  3.8   Chloride 98 - 111 mmol/L 107  107  104   CO2 22 - 32 mmol/L 31  31  30    Calcium 8.9 - 10.3 mg/dL 9.3  9.1  8.9   Total Protein 6.5 - 8.1 g/dL 6.2  6.0    Total Bilirubin 0.3 - 1.2 mg/dL 1.3  1.3    Alkaline Phos 38 - 126 U/L 87  78    AST 15 - 41 U/L 18  20    ALT 0 - 44 U/L 16  18       ASSESSMENT & PLAN Brooke Whitaker 72 y.o. female with medical history significant for arthritis, hyperlipidemia, osteopenia, and diverticulosis who presents for evaluation of macrocytic anemia and thrombocytopenia.  After review of the labs, review of the records, and discussion with the patient the patients findings are most consistent with periodic macrocytosis this and thrombocytopenia.  Thrombocytopenia is a common condition with a broad differential. The possible etiologies of thrombocytopenia include liver disease, splenomegaly, infectious process, nutritional deficiency, consumption/autoimmune destruction,  pseudothrombocytopenia, and bone marrow disorders. Cirrhosis and liver disease the most common causes of moderate thrombocytopenia. Evaluation should include full hepatitis serologies (Hep B and C) as well as HIV. Imaging of the liver spleen should be performed with an abdominal US ( if prior imaging is no readily available). Nutritional etiologies should be ruled out with Vitamin b12 and folate testing.  A peripheral blood smear can help determine if there is clumping leading to pseudothrombocytopenia. If no clear etiology can be found would need to consider immune thrombocytopenia (ITP) with consideration of bone marrow biopsy.   # Macrocytic Anemia # Thrombocytopenia --will order CBC, CMP, Vitamin b12, MMA, homocysteine and folate.   --will order SPEP and SLFC to r/o MM -- hemolysis labs with reticulocytes, LDH --viral serologies with Hepatitis B, Hepatitis C, and HIV  --MRI of the abdomen in August 2023 and CT scan in Sept 2022 shows no evidence of liver disease/splenomegaly. No indication for repeat imaging at this time.  --RTC in 6 months or sooner pending the results of the above studies.    Orders Placed This Encounter  Procedures   CBC with Differential (Cancer Center Only)    Standing Status:   Future    Number of Occurrences:   1    Standing Expiration Date:   05/15/2024   CMP (Cancer Center only)    Standing Status:   Future    Number of Occurrences:   1    Standing Expiration Date:   05/15/2024   Ferritin    Standing Status:   Future    Number of Occurrences:   1    Standing Expiration Date:   05/15/2024   Iron and Iron Binding Capacity ([facility] only)    Standing Status:   Future    Number of Occurrences:   1    Standing Expiration Date:   05/15/2024   Retic Panel    Standing Status:   Future    Number of Occurrences:   1    Standing Expiration Date:   05/15/2024   Vitamin B12    Standing Status:   Future    Number of Occurrences:   1    Standing Expiration Date:    05/15/2024   Methylmalonic acid, serum    Standing Status:   Future    Number of Occurrences:   1    Standing Expiration Date:   05/15/2024   Lactate dehydrogenase (LDH)    Standing Status:   Future    Number of Occurrences:   1    Standing Expiration Date:   05/15/2024   Folate, Serum    Standing Status:   Future    Number of Occurrences:   1    Standing Expiration Date:   05/15/2024   Multiple Myeloma Panel (  SPEP&IFE w/QIG)    Standing Status:   Future    Number of Occurrences:   1    Standing Expiration Date:   05/15/2024   Kappa/lambda light chains    Standing Status:   Future    Number of Occurrences:   1    Standing Expiration Date:   05/15/2024   Sedimentation rate    Standing Status:   Future    Number of Occurrences:   1    Standing Expiration Date:   05/15/2024   Immature Platelet Fraction    Standing Status:   Future    Number of Occurrences:   1    Standing Expiration Date:   05/15/2024   C-reactive protein    Standing Status:   Future    Number of Occurrences:   1    Standing Expiration Date:   05/15/2024   Hepatitis C antibody    Standing Status:   Future    Number of Occurrences:   1    Standing Expiration Date:   05/15/2024   Hepatitis B surface antigen    Standing Status:   Future    Number of Occurrences:   1    Standing Expiration Date:   05/15/2024   Hepatitis B core antibody, total    Standing Status:   Future    Number of Occurrences:   1    Standing Expiration Date:   05/15/2024   HIV antibody (with reflex)    Standing Status:   Future    Number of Occurrences:   1    Standing Expiration Date:   05/15/2024    All questions were answered. The patient knows to call the clinic with any problems, questions or concerns.  A total of more than 60 minutes were spent on this encounter with face-to-face time and non-face-to-face time, including preparing to see the patient, ordering tests and/or medications, counseling the patient and coordination of care as  outlined above.   Ulysees Barns, MD Department of Hematology/Oncology [facility] at [facility] Phone: [phone] Pager: [phone] Email: [email or web address]  05/16/2023 5:01 PM

## 2023-05-16 ENCOUNTER — Inpatient Hospital Stay: Payer: Medicare HMO | Attending: Hematology and Oncology

## 2023-05-16 ENCOUNTER — Inpatient Hospital Stay: Payer: Medicare HMO | Admitting: Hematology and Oncology

## 2023-05-16 ENCOUNTER — Other Ambulatory Visit: Payer: Self-pay

## 2023-05-16 VITALS — BP 128/54 | HR 76 | Temp 97.6°F | Resp 14 | Wt 134.2 lb

## 2023-05-16 DIAGNOSIS — Z808 Family history of malignant neoplasm of other organs or systems: Secondary | ICD-10-CM

## 2023-05-16 DIAGNOSIS — D539 Nutritional anemia, unspecified: Secondary | ICD-10-CM | POA: Diagnosis not present

## 2023-05-16 DIAGNOSIS — Z800 Family history of malignant neoplasm of digestive organs: Secondary | ICD-10-CM | POA: Insufficient documentation

## 2023-05-16 DIAGNOSIS — D696 Thrombocytopenia, unspecified: Secondary | ICD-10-CM

## 2023-05-16 LAB — CBC WITH DIFFERENTIAL (CANCER CENTER ONLY)
Abs Immature Granulocytes: 0.02 10*3/uL (ref 0.00–0.07)
Basophils Absolute: 0 10*3/uL (ref 0.0–0.1)
Basophils Relative: 1 %
Eosinophils Absolute: 0.1 10*3/uL (ref 0.0–0.5)
Eosinophils Relative: 2 %
HCT: 33.6 % — ABNORMAL LOW (ref 36.0–46.0)
Hemoglobin: 11.2 g/dL — ABNORMAL LOW (ref 12.0–15.0)
Immature Granulocytes: 1 %
Lymphocytes Relative: 58 %
Lymphs Abs: 2.5 10*3/uL (ref 0.7–4.0)
MCH: 34.4 pg — ABNORMAL HIGH (ref 26.0–34.0)
MCHC: 33.3 g/dL (ref 30.0–36.0)
MCV: 103.1 fL — ABNORMAL HIGH (ref 80.0–100.0)
Monocytes Absolute: 0.2 10*3/uL (ref 0.1–1.0)
Monocytes Relative: 6 %
Neutro Abs: 1.3 10*3/uL — ABNORMAL LOW (ref 1.7–7.7)
Neutrophils Relative %: 32 %
Platelet Count: 108 10*3/uL — ABNORMAL LOW (ref 150–400)
RBC: 3.26 MIL/uL — ABNORMAL LOW (ref 3.87–5.11)
RDW: 14.8 % (ref 11.5–15.5)
WBC Count: 4.1 10*3/uL (ref 4.0–10.5)
nRBC: 0 % (ref 0.0–0.2)

## 2023-05-16 LAB — CMP (CANCER CENTER ONLY)
ALT: 16 U/L (ref 0–44)
AST: 18 U/L (ref 15–41)
Albumin: 4.6 g/dL (ref 3.5–5.0)
Alkaline Phosphatase: 87 U/L (ref 38–126)
Anion gap: 4 — ABNORMAL LOW (ref 5–15)
BUN: 21 mg/dL (ref 8–23)
CO2: 31 mmol/L (ref 22–32)
Calcium: 9.3 mg/dL (ref 8.9–10.3)
Chloride: 107 mmol/L (ref 98–111)
Creatinine: 0.67 mg/dL (ref 0.44–1.00)
GFR, Estimated: >60 mL/min (ref >60)
Glucose, Bld: 87 mg/dL (ref 70–99)
Potassium: 3.8 mmol/L (ref 3.5–5.1)
Sodium: 142 mmol/L (ref 135–145)
Total Bilirubin: 1.3 mg/dL — ABNORMAL HIGH (ref 0.3–1.2)
Total Protein: 6.2 g/dL — ABNORMAL LOW (ref 6.5–8.1)

## 2023-05-16 LAB — LACTATE DEHYDROGENASE: LDH: 166 U/L (ref 98–192)

## 2023-05-16 LAB — IRON AND IRON BINDING CAPACITY (CC-WL,HP ONLY)
Iron: 97 ug/dL (ref 28–170)
Saturation Ratios: 25 % (ref 10.4–31.8)
TIBC: 382 ug/dL (ref 250–450)
UIBC: 285 ug/dL (ref 148–442)

## 2023-05-16 LAB — RETIC PANEL
Immature Retic Fract: 25.6 % — ABNORMAL HIGH (ref 2.3–15.9)
RBC.: 3.34 MIL/uL — ABNORMAL LOW (ref 3.87–5.11)
Retic Count, Absolute: 69.5 10*3/uL (ref 19.0–186.0)
Retic Ct Pct: 2.1 % (ref 0.4–3.1)
Reticulocyte Hemoglobin: 34.5 pg (ref >27.9)

## 2023-05-16 LAB — HIV ANTIBODY (ROUTINE TESTING W REFLEX): HIV Screen 4th Generation wRfx: NONREACTIVE

## 2023-05-16 LAB — HEPATITIS C ANTIBODY: HCV Ab: NONREACTIVE

## 2023-05-16 LAB — C-REACTIVE PROTEIN: CRP: 0.5 mg/dL (ref <1.0)

## 2023-05-16 LAB — HEPATITIS B SURFACE ANTIGEN: Hepatitis B Surface Ag: NONREACTIVE

## 2023-05-16 LAB — FERRITIN: Ferritin: 68 ng/mL (ref 11–307)

## 2023-05-16 LAB — FOLATE: Folate: 9.3 ng/mL (ref >5.9)

## 2023-05-16 LAB — VITAMIN B12: Vitamin B-12: 637 pg/mL (ref 180–914)

## 2023-05-16 LAB — HEPATITIS B CORE ANTIBODY, TOTAL: Hep B Core Total Ab: NONREACTIVE

## 2023-05-16 LAB — IMMATURE PLATELET FRACTION: Immature Platelet Fraction: 1.8 % (ref 1.2–8.6)

## 2023-05-16 LAB — SEDIMENTATION RATE: Sed Rate: 1 mm/hr (ref 0–22)

## 2023-05-17 ENCOUNTER — Telehealth: Payer: Self-pay | Admitting: Hematology and Oncology

## 2023-05-18 ENCOUNTER — Telehealth: Payer: Self-pay | Admitting: *Deleted

## 2023-05-18 ENCOUNTER — Other Ambulatory Visit: Payer: Self-pay | Admitting: Hematology and Oncology

## 2023-05-18 DIAGNOSIS — D472 Monoclonal gammopathy: Secondary | ICD-10-CM

## 2023-05-18 DIAGNOSIS — D539 Nutritional anemia, unspecified: Secondary | ICD-10-CM

## 2023-05-18 NOTE — Telephone Encounter (Signed)
TCT patient again regarding lab results and the need for a bone marrow biopsy. Spoke with her. Advised  that we found an abnormal protein in her blood, known as the M protein, It could be the sign of an underlying disorder called multiple myeloma. We will need to order a bone marrow biopsy. The order has been placed, please make patient aware that she will be called to schedule this procedure. Pt voiced understanding. Educated on the procedure that will include IV sedation and that she will need a driver due to the sedation. Pt is agreeable. Advised that we will schedule her f/u appt with Dr. Leonides Schanz once we know the date of the biopsy. Pt  is agreeable to proceed.

## 2023-05-18 NOTE — Telephone Encounter (Signed)
TCT patient regarding recent lab results. No answer. Will try again later this am.

## 2023-05-18 NOTE — Telephone Encounter (Signed)
-----   Message from Ulysees Barns IV sent at 05/18/2023  8:30 AM EDT ----- Please let Brooke Whitaker know that we found an abnormal protein in her blood, known as the M protein, It could be the sign of an underlying disorder called multiple myeloma. We will need to order a bone marrow biopsy. The order has been placed, please make patient aware that she will be called to schedule this procedure. ----- Message ----- From: Interface, Lab In [place] Sent: 05/16/2023  11:28 AM EDT To: Jaci Standard, MD

## 2023-05-22 ENCOUNTER — Encounter: Payer: Self-pay | Admitting: Hematology and Oncology

## 2023-05-22 ENCOUNTER — Telehealth: Payer: Self-pay | Admitting: Hematology and Oncology

## 2023-05-22 NOTE — Telephone Encounter (Signed)
Called Mrs. Damore to discuss the need for a bone marrow biopsy.  The patient has markedly high lambda light chains with deep concern for multiple myeloma.  Called patient twice but both calls went straight to voicemail.  Left voicemail requesting callback.  At this time I do believe a bone marrow biopsy is required based on her marked elevations in lambda light chain.  Ulysees Barns, MD Department of Hematology/Oncology [facility] at [facility] Phone: [phone] Pager: [phone] Email: [email or web address]

## 2023-05-24 ENCOUNTER — Encounter: Payer: Self-pay | Admitting: Hematology and Oncology

## 2023-05-25 ENCOUNTER — Telehealth: Payer: Self-pay | Admitting: Hematology and Oncology

## 2023-05-25 NOTE — Telephone Encounter (Signed)
Called Brooke Whitaker to discuss the results of her blood work.  At this time findings are concerning for a lambda light chain of 900.  Given these findings with her cytopenias I would recommend performing a bone marrow biopsy.  Today we discussed the bone marrow biopsy procedure in detail.  She voiced her understanding of the lab findings and plan moving forward.  Will plan to regroup with her in clinic once the biopsy results are available.  Ulysees Barns, MD Department of Hematology/Oncology [facility] at [facility] Phone: [phone] Pager: [phone] Email: [email or web address]

## 2023-05-31 ENCOUNTER — Other Ambulatory Visit: Payer: Self-pay | Admitting: Radiology

## 2023-05-31 DIAGNOSIS — D472 Monoclonal gammopathy: Secondary | ICD-10-CM

## 2023-05-31 NOTE — Consult Note (Signed)
Chief Complaint: Patient was seen in consultation today for CT-guided bone marrow biopsy  Referring Physician(s): Dorsey,John T IV  Supervising Physician: Marliss Coots  Patient Status: [facility] - Out-pt  History of Present Illness: Brooke Whitaker is a 72 y.o. 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  She is scheduled today for CT-guided bone marrow biopsy for further evaluation/confirmation.  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    Sigmoid diverticulosis    Wears glasses     Past Surgical History:  Procedure Laterality Date   BREAST BIOPSY Right 09/2011   benign    COLONOSCOPY  2010   CYSTOSCOPY/RETROGRADE/URETEROSCOPY/STONE EXTRACTION WITH BASKET Left 04/21/2014   Procedure: cystoscopy, left retrograde pylegram, LEFT URETEROSCOPY, laser lithotripsy with holmium laser, STONE EXTRACTION, uretheral calibration;  Surgeon: Anner Crete, MD;  Location: [facility];  Service: Urology;  Laterality: Left;   DILATION AND EVACUATION  1995   EPISIOTOMY REPAIR, SEVERE TEAR  1989   LAPAROSCOPIC OVARIAN CYSTECTOMY  10/ 2000   NM MYOCAR PERF WALL MOTION  12/15/2009   Normal   US ECHOCARDIOGRAPHY  12/15/2009   Normal study for age: mild MR,TR and trace PI    Allergies: Penicillins and Sulfa antibiotics  Medications: Prior to Admission medications   Medication Sig Start Date End Date Taking? Authorizing Provider  acetaminophen (TYLENOL) 500 MG tablet Take 500 mg by mouth every 6 (six) hours as needed for mild pain or headache.     [provider]  cetirizine (ZYRTEC) 5 MG tablet Take 5 mg by mouth daily as needed for allergies.    [provider]   cholecalciferol (VITAMIN D) 1000 UNITS tablet Take 1,000 Units by mouth every morning.     [provider]  Cyanocobalamin (VITAMIN B 12 PO) Take 1,000 mcg by mouth daily.    [provider]  ELDERBERRY PO Take 1 tablet by mouth daily.    [provider]  fluticasone (FLONASE) 50 MCG/ACT nasal spray Place into the nose as needed.    [provider]  rosuvastatin (CRESTOR) 5 MG tablet Take 5 mg by mouth daily. 04/24/19   [provider]  valACYclovir (VALTREX) 500 MG tablet TAKE 4 TABLETS BY MOUTH EVERY 12 HOURS X 1 DAY FOR FEVER BLISTER AS NEEDED. 05/15/19   Jerene Bears, MD     Family History  Problem Relation Age of Onset   Diabetes Mother    Congestive Heart Failure Mother    Stroke Mother    Osteoporosis Mother    Other Mother        gallbladder ruptured   Heart attack Father    Diabetes Sister    Heart disease Sister    Heart disease Sister        x2   Hypertension Sister    Thyroid disease Sister    Hypertension Sister    Diabetes Brother    Dementia Brother    Colon cancer Brother        mets to liver   Heart disease Brother        pacemaker   Stroke Brother    Liver cancer Brother    Heart disease Brother  Stomach cancer Neg Hx    Pancreatic cancer Neg Hx    Esophageal cancer Neg Hx     Social History   Socioeconomic History   Marital status: Married    Spouse name: Not on file   Number of children: 2   Years of education: Not on file   Highest education level: Not on file  Occupational History   Occupation: Print production planner  Tobacco Use   Smoking status: Never    Passive exposure: Never   Smokeless tobacco: Never  Vaping Use   Vaping status: Never Used  Substance and Sexual Activity   Alcohol use: Yes    Alcohol/week: 2.0 standard drinks of alcohol    Types: 2 Glasses of wine per week   Drug use: No   Sexual activity: Not Currently    Partners: Male  Other Topics Concern   Not on file  Social History  Narrative   Not on file   Social Determinants of Health   Financial Resource Strain: Not on file  Food Insecurity: No Food Insecurity (05/16/2023)   Hunger Vital Sign    Worried About Running Out of Food in the Last Year: Never true    Ran Out of Food in the Last Year: Never true  Transportation Needs: No Transportation Needs (05/16/2023)   PRAPARE - Administrator, Civil Service (Medical): No    Lack of Transportation (Non-Medical): No  Physical Activity: Not on file  Stress: Not on file  Social Connections: Not on file      Review of Systems: denies fever,HA,CP,dyspnea, cough, abd pain,N/V or bleeding; she does have back pain  Vital Signs: Vitals:   06/01/23 0900  BP: (!) 153/65  Pulse: 77  Resp: 16  Temp: 97.8 F (36.6 C)  SpO2: 99%    LMP 10/16/2001   Code Status: FULL CODE  Advance Care Plan: No documents on file   Physical Exam: awake/alert; chest- CTA bilat; heart- RRR; abd-soft,+BS,NT; no LE edema  Imaging: No results found.  Labs:  CBC: Recent Labs    05/16/23 1101  WBC 4.1  HGB 11.2*  HCT 33.6*  PLT 108*    COAGS: No results for input(s): "INR", "APTT" in the last 8760 hours.  BMP: Recent Labs    05/16/23 1101  NA 142  K 3.8  CL 107  CO2 31  GLUCOSE 87  BUN 21  CALCIUM 9.3  CREATININE 0.67  GFRNONAA >60    LIVER FUNCTION TESTS: Recent Labs    05/16/23 1101  BILITOT 1.3*  AST 18  ALT 16  ALKPHOS 87  PROT 6.2*  ALBUMIN 4.6    TUMOR MARKERS: No results for input(s): "AFPTM", "CEA", "CA199", "CHROMGRNA" in the last 8760 hours.  Assessment and Plan: 72 y.o. 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  She is scheduled today for CT-guided bone marrow biopsy for further evaluation/confirmation.Risks and benefits of procedure was discussed with the patient  including, but  not limited to bleeding, infection, damage to adjacent structures or low yield requiring additional tests.  All of the questions were answered and there is agreement to proceed.  Consent signed and in chart.    Thank you for this interesting consult.  I greatly enjoyed meeting Brooke Whitaker and look forward to participating in their care.  A copy of this report was sent to the requesting provider on this date.  Electronically  Signed: D. Jeananne Rama, PA-C 05/31/2023, 12:00 PM   I spent a total of  20 minutes   in face to face in clinical consultation, greater than 50% of which was counseling/coordinating care for CT-guided bone marrow biopsy

## 2023-06-01 ENCOUNTER — Ambulatory Visit (HOSPITAL_COMMUNITY): Admission: RE | Admit: 2023-06-01 | Payer: Medicare HMO | Source: Ambulatory Visit

## 2023-06-01 ENCOUNTER — Ambulatory Visit (HOSPITAL_COMMUNITY)
Admission: RE | Admit: 2023-06-01 | Discharge: 2023-06-01 | Disposition: A | Discharge status: Home / Self Care | Payer: Medicare HMO | Source: Ambulatory Visit | Attending: Hematology and Oncology | Admitting: Hematology and Oncology

## 2023-06-01 ENCOUNTER — Encounter (HOSPITAL_COMMUNITY): Payer: Self-pay

## 2023-06-01 ENCOUNTER — Other Ambulatory Visit: Payer: Self-pay

## 2023-06-01 DIAGNOSIS — D539 Nutritional anemia, unspecified: Secondary | ICD-10-CM | POA: Insufficient documentation

## 2023-06-01 DIAGNOSIS — D472 Monoclonal gammopathy: Secondary | ICD-10-CM

## 2023-06-01 DIAGNOSIS — D696 Thrombocytopenia, unspecified: Secondary | ICD-10-CM | POA: Insufficient documentation

## 2023-06-01 DIAGNOSIS — Z1379 Encounter for other screening for genetic and chromosomal anomalies: Secondary | ICD-10-CM | POA: Insufficient documentation

## 2023-06-01 LAB — CBC WITH DIFFERENTIAL/PLATELET
Abs Immature Granulocytes: 0.02 10*3/uL (ref 0.00–0.07)
Basophils Absolute: 0 10*3/uL (ref 0.0–0.1)
Basophils Relative: 1 %
Eosinophils Absolute: 0.1 10*3/uL (ref 0.0–0.5)
Eosinophils Relative: 2 %
HCT: 36.3 % (ref 36.0–46.0)
Hemoglobin: 11.8 g/dL — ABNORMAL LOW (ref 12.0–15.0)
Immature Granulocytes: 1 %
Lymphocytes Relative: 52 %
Lymphs Abs: 2.1 10*3/uL (ref 0.7–4.0)
MCH: 33.5 pg (ref 26.0–34.0)
MCHC: 32.5 g/dL (ref 30.0–36.0)
MCV: 103.1 fL — ABNORMAL HIGH (ref 80.0–100.0)
Monocytes Absolute: 0.2 10*3/uL (ref 0.1–1.0)
Monocytes Relative: 6 %
Neutro Abs: 1.5 10*3/uL — ABNORMAL LOW (ref 1.7–7.7)
Neutrophils Relative %: 38 %
Platelets: 102 10*3/uL — ABNORMAL LOW (ref 150–400)
RBC: 3.52 MIL/uL — ABNORMAL LOW (ref 3.87–5.11)
RDW: 14.6 % (ref 11.5–15.5)
WBC: 4 10*3/uL (ref 4.0–10.5)
nRBC: 0 % (ref 0.0–0.2)

## 2023-06-01 MED ORDER — MIDAZOLAM HCL 2 MG/2ML IJ SOLN
INTRAMUSCULAR | Status: AC
  Filled 2023-06-01: qty 2

## 2023-06-01 MED ORDER — FENTANYL CITRATE (PF) 100 MCG/2ML IJ SOLN
INTRAMUSCULAR | Status: AC | PRN
Start: 2023-06-01 — End: 2023-06-01
  Administered 2023-06-01 (×2): 50 ug via INTRAVENOUS

## 2023-06-01 MED ORDER — MIDAZOLAM HCL 2 MG/2ML IJ SOLN
INTRAMUSCULAR | Status: AC | PRN
  Administered 2023-06-01 (×2): 1 mg via INTRAVENOUS

## 2023-06-01 MED ORDER — FENTANYL CITRATE (PF) 100 MCG/2ML IJ SOLN
INTRAMUSCULAR | Status: AC
  Filled 2023-06-01: qty 2

## 2023-06-01 MED ORDER — SODIUM CHLORIDE 0.9 % IV SOLN: INTRAVENOUS | Status: DC

## 2023-06-01 NOTE — Procedures (Signed)
Interventional Radiology Procedure Note  Procedure: CT guided aspirate and core biopsy of right iliac bone  Complications: None  Recommendations: - Bedrest supine x 1 hrs - Hydrocodone PRN  Pain - Follow biopsy results   Dylan Suttle, MD   

## 2023-06-01 NOTE — Discharge Instructions (Signed)
Please call Interventional Radiology clinic [phone] with any questions or concerns.  You may remove your dressing and shower tomorrow.   Bone Marrow Aspiration and Bone Marrow Biopsy, Adult, Care After This sheet gives you information about how to care for yourself after your procedure. Your health care provider may also give you more specific instructions. If you have problems or questions, contact your health care provider. What can I expect after the procedure? After the procedure, it is common to have: Mild pain and tenderness. Swelling. Bruising. Follow these instructions at home: Puncture site care Follow instructions from your health care provider about how to take care of the puncture site. Make sure you: Wash your hands with soap and water before and after you change your bandage (dressing). If soap and water are not available, use hand sanitizer. Change your dressing as told by your health care provider. Check your puncture site every day for signs of infection. Check for: More redness, swelling, or pain. Fluid or blood. Warmth. Pus or a bad smell.   Activity Return to your normal activities as told by your health care provider. Ask your health care provider what activities are safe for you. Do not lift anything that is heavier than 10 lb (4.5 kg), or the limit that you are told, until your health care provider says that it is safe. Do not drive for 24 hours if you were given a sedative during your procedure. General instructions Take over-the-counter and prescription medicines only as told by your health care provider. Do not take baths, swim, or use a hot tub until your health care provider approves. Ask your health care provider if you may take showers. You may only be allowed to take sponge baths. If directed, put ice on the affected area. To do this: Put ice in a plastic bag. Place a towel between your skin and the bag. Leave the ice on for 20 minutes, 2-3 times a  day. Keep all follow-up visits as told by your health care provider. This is important.   Contact a health care provider if: Your pain is not controlled with medicine. You have a fever. You have more redness, swelling, or pain around the puncture site. You have fluid or blood coming from the puncture site. Your puncture site feels warm to the touch. You have pus or a bad smell coming from the puncture site. Summary After the procedure, it is common to have mild pain, tenderness, swelling, and bruising. Follow instructions from your health care provider about how to take care of the puncture site and what activities are safe for you. Take over-the-counter and prescription medicines only as told by your health care provider. Contact a health care provider if you have any signs of infection, such as fluid or blood coming from the puncture site. This information is not intended to replace advice given to you by your health care provider. Make sure you discuss any questions you have with your health care provider. Document Revised: 02/18/2019 Document Reviewed: 02/18/2019 Elsevier Patient Education  2021 [employer]   Moderate Conscious Sedation, Adult, Care After This sheet gives you information about how to care for yourself after your procedure. Your health care provider may also give you more specific instructions. If you have problems or questions, contact your health care provider. What can I expect after the procedure? After the procedure, it is common to have: Sleepiness for several hours. Impaired judgment for several hours. Difficulty with balance. Vomiting if you eat too  soon. Follow these instructions at home: For the time period you were told by your health care provider: Rest. Do not participate in activities where you could fall or become injured. Do not drive or use machinery. Do not drink alcohol. Do not take sleeping pills or medicines that cause drowsiness. Do not  make important decisions or sign legal documents. Do not take care of children on your own.      Eating and drinking Follow the diet recommended by your health care provider. Drink enough fluid to keep your urine pale yellow. If you vomit: Drink water, juice, or soup when you can drink without vomiting. Make sure you have little or no nausea before eating solid foods.   General instructions Take over-the-counter and prescription medicines only as told by your health care provider. Have a responsible adult stay with you for the time you are told. It is important to have someone help care for you until you are awake and alert. Do not smoke. Keep all follow-up visits as told by your health care provider. This is important. Contact a health care provider if: You are still sleepy or having trouble with balance after 24 hours. You feel light-headed. You keep feeling nauseous or you keep vomiting. You develop a rash. You have a fever. You have redness or swelling around the IV site. Get help right away if: You have trouble breathing. You have new-onset confusion at home. Summary After the procedure, it is common to feel sleepy, have impaired judgment, or feel nauseous if you eat too soon. Rest after you get home. Know the things you should not do after the procedure. Follow the diet recommended by your health care provider and drink enough fluid to keep your urine pale yellow. Get help right away if you have trouble breathing or new-onset confusion at home. This information is not intended to replace advice given to you by your health care provider. Make sure you discuss any questions you have with your health care provider. Document Revised: 01/30/2020 Document Reviewed: 08/28/2019 Elsevier Patient Education  2021 Elsevier Inc.Please call Interventional Radiology clinic [phone] with any questions or concerns.  You may remove your dressing and shower tomorrow.   Bone Marrow  Aspiration and Bone Marrow Biopsy, Adult, Care After This sheet gives you information about how to care for yourself after your procedure. Your health care provider may also give you more specific instructions. If you have problems or questions, contact your health care provider. What can I expect after the procedure? After the procedure, it is common to have: Mild pain and tenderness. Swelling. Bruising. Follow these instructions at home: Puncture site care Follow instructions from your health care provider about how to take care of the puncture site. Make sure you: Wash your hands with soap and water before and after you change your bandage (dressing). If soap and water are not available, use hand sanitizer. Change your dressing as told by your health care provider. Check your puncture site every day for signs of infection. Check for: More redness, swelling, or pain. Fluid or blood. Warmth. Pus or a bad smell.   Activity Return to your normal activities as told by your health care provider. Ask your health care provider what activities are safe for you. Do not lift anything that is heavier than 10 lb (4.5 kg), or the limit that you are told, until your health care provider says that it is safe. Do not drive for 24 hours if you were given a  sedative during your procedure. General instructions Take over-the-counter and prescription medicines only as told by your health care provider. Do not take baths, swim, or use a hot tub until your health care provider approves. Ask your health care provider if you may take showers. You may only be allowed to take sponge baths. If directed, put ice on the affected area. To do this: Put ice in a plastic bag. Place a towel between your skin and the bag. Leave the ice on for 20 minutes, 2-3 times a day. Keep all follow-up visits as told by your health care provider. This is important.   Contact a health care provider if: Your pain is not controlled  with medicine. You have a fever. You have more redness, swelling, or pain around the puncture site. You have fluid or blood coming from the puncture site. Your puncture site feels warm to the touch. You have pus or a bad smell coming from the puncture site. Summary After the procedure, it is common to have mild pain, tenderness, swelling, and bruising. Follow instructions from your health care provider about how to take care of the puncture site and what activities are safe for you. Take over-the-counter and prescription medicines only as told by your health care provider. Contact a health care provider if you have any signs of infection, such as fluid or blood coming from the puncture site. This information is not intended to replace advice given to you by your health care provider. Make sure you discuss any questions you have with your health care provider. Document Revised: 02/18/2019 Document Reviewed: 02/18/2019 Elsevier Patient Education  2021 [employer] Inc.   Moderate Conscious Sedation, Adult, Care After This sheet gives you information about how to care for yourself after your procedure. Your health care provider may also give you more specific instructions. If you have problems or questions, contact your health care provider. What can I expect after the procedure? After the procedure, it is common to have: Sleepiness for several hours. Impaired judgment for several hours. Difficulty with balance. Vomiting if you eat too soon. Follow these instructions at home: For the time period you were told by your health care provider: Rest. Do not participate in activities where you could fall or become injured. Do not drive or use machinery. Do not drink alcohol. Do not take sleeping pills or medicines that cause drowsiness. Do not make important decisions or sign legal documents. Do not take care of children on your own.      Eating and drinking Follow the diet recommended by your  health care provider. Drink enough fluid to keep your urine pale yellow. If you vomit: Drink water, juice, or soup when you can drink without vomiting. Make sure you have little or no nausea before eating solid foods.   General instructions Take over-the-counter and prescription medicines only as told by your health care provider. Have a responsible adult stay with you for the time you are told. It is important to have someone help care for you until you are awake and alert. Do not smoke. Keep all follow-up visits as told by your health care provider. This is important. Contact a health care provider if: You are still sleepy or having trouble with balance after 24 hours. You feel light-headed. You keep feeling nauseous or you keep vomiting. You develop a rash. You have a fever. You have redness or swelling around the IV site. Get help right away if: You have trouble breathing. You have new-onset  confusion at home. Summary After the procedure, it is common to feel sleepy, have impaired judgment, or feel nauseous if you eat too soon. Rest after you get home. Know the things you should not do after the procedure. Follow the diet recommended by your health care provider and drink enough fluid to keep your urine pale yellow. Get help right away if you have trouble breathing or new-onset confusion at home. This information is not intended to replace advice given to you by your health care provider. Make sure you discuss any questions you have with your health care provider. Document Revised: 01/30/2020 Document Reviewed: 08/28/2019 Elsevier Patient Education  2021 [employer].

## 2023-06-06 ENCOUNTER — Telehealth: Payer: Self-pay | Admitting: Hematology and Oncology

## 2023-06-06 ENCOUNTER — Encounter: Payer: Self-pay | Admitting: Hematology and Oncology

## 2023-06-06 LAB — SURGICAL PATHOLOGY

## 2023-06-06 NOTE — Telephone Encounter (Signed)
 Patient is aware of scheduled appointment times/date

## 2023-06-10 NOTE — Progress Notes (Signed)
[facility] [phone]   [phone]  PROGRESS NOTE  Patient Care Team: Rodrigo Ran, MD as PCP - General (Internal Medicine)  Hematological/Oncological History # Free Lambda Multiple Myeloma 11/10/2022: WBC 5.68, Hgb 11.7, MCV 97.4, Plt 146 04/17/2023: WBC 4.53, Hgb 11.5, MCV 107.6, Plt 110 05/16/2023: establish care with Dr. Leonides Schanz. Labs showed M protein 0.1 with Lambda 900, Kappa 3 with ratio 0.00.   06/01/2023: bone marrow biopsy performed, showed plasma cell myeloma nearly effacing approximately 80% of the cellular marrow.   Interval History:  Brooke Whitaker 72 y.o. female with medical history significant for newly diagnosed multiple myeloma who presents for a follow up visit. The patient's last visit was on 05/16/2023. In the interim since the last visit she underwent a bone marrow biopsy which confirmed the diagnosis of multiple myeloma.   On exam today [name] reports the bone marrow biopsy procedure went fine.  She reports that afterwards she sat on some ice and took ibuprofen.  Is only sore for about 1 to 2 days afterwards.  She reports that otherwise she has been her baseline level of health.  She has had no recent issues with infectious symptom such as runny nose, sore throat, or cough.  She denies any fevers, chills, sweats, nausea, vomiting, or diarrhea.  A Whitaker 10 point ROS is otherwise negative.  The bulk of our discussion focused on the results of the bone marrow biopsy which confirmed diagnosis of multiple myeloma.  We discussed treatment options moving forward, prognosis, and the disease in general.  Details of this conversation are noted below.  The patient voiced her understanding of our plan moving forward.  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     Sigmoid diverticulosis    Wears glasses     SURGICAL HISTORY: Past Surgical History:  Procedure Laterality Date   BREAST BIOPSY Right 09/2011   benign    COLONOSCOPY  2010   CYSTOSCOPY/RETROGRADE/URETEROSCOPY/STONE EXTRACTION WITH BASKET Left 04/21/2014   Procedure: cystoscopy, left retrograde pylegram, LEFT URETEROSCOPY, laser lithotripsy with holmium laser, STONE EXTRACTION, uretheral calibration;  Surgeon: Anner Crete, MD;  Location: [facility];  Service: Urology;  Laterality: Left;   DILATION AND EVACUATION  1995   EPISIOTOMY REPAIR, SEVERE TEAR  1989   LAPAROSCOPIC OVARIAN CYSTECTOMY  10/ 2000   NM MYOCAR PERF WALL MOTION  12/15/2009   Normal   US ECHOCARDIOGRAPHY  12/15/2009   Normal study for age: mild MR,TR and trace PI    SOCIAL HISTORY: Social History   Socioeconomic History   Marital status: Married    Spouse name: Not on file   Number of children: 2   Years of education: Not on file   Highest education level: Not on file  Occupational History   Occupation: Print production planner  Tobacco Use   Smoking status: Never    Passive exposure: Never   Smokeless tobacco: Never  Vaping Use   Vaping status: Never Used  Substance and Sexual Activity   Alcohol use: Yes    Alcohol/week: 2.0 standard drinks of alcohol    Types: 2 Glasses of wine per week   Drug use: No   Sexual activity: Not Currently    Partners: Male  Other Topics  Concern   Not on file  Social History Narrative   Not on file   Social Determinants of Health   Financial Resource Strain: Not on file  Food Insecurity: No Food Insecurity (05/16/2023)   Hunger Vital Sign    Worried About Running Out of Food in the Last Year: Never true    Ran Out of Food in the Last Year: Never true  Transportation Needs: No Transportation Needs (05/16/2023)   PRAPARE - Administrator, Civil Service (Medical): No    Lack of Transportation (Non-Medical): No  Physical Activity: Not on file  Stress: Not  on file  Social Connections: Not on file  Intimate Partner Violence: Not At Risk (05/16/2023)   Humiliation, Afraid, Rape, and Kick questionnaire    Fear of Current or Ex-Partner: No    Emotionally Abused: No    Physically Abused: No    Sexually Abused: No    FAMILY HISTORY: Family History  Problem Relation Age of Onset   Diabetes Mother    Congestive Heart Failure Mother    Stroke Mother    Osteoporosis Mother    Other Mother        gallbladder ruptured   Heart attack Father    Diabetes Sister    Heart disease Sister    Heart disease Sister        x2   Hypertension Sister    Thyroid disease Sister    Hypertension Sister    Diabetes Brother    Dementia Brother    Colon cancer Brother        mets to liver   Heart disease Brother        pacemaker   Stroke Brother    Liver cancer Brother    Heart disease Brother    Stomach cancer Neg Hx    Pancreatic cancer Neg Hx    Esophageal cancer Neg Hx     ALLERGIES:  is allergic to penicillins and sulfa antibiotics.  MEDICATIONS:  Current Outpatient Medications  Medication Sig Dispense Refill   acyclovir (ZOVIRAX) 400 MG tablet Take 1 tablet (400 mg total) by mouth 2 (two) times daily. 180 tablet 1   allopurinol (ZYLOPRIM) 300 MG tablet Take 1 tablet (300 mg total) by mouth daily. 90 tablet 0   dexamethasone (DECADRON) 4 MG tablet Take 10 tablets (40 mg total) by mouth once a week. Take once weekly on the day you receive the chemotherapy injection. 20 tablet 5   ondansetron (ZOFRAN) 8 MG tablet Take 1 tablet (8 mg total) by mouth every 8 (eight) hours as needed. 30 tablet 0   prochlorperazine (COMPAZINE) 10 MG tablet Take 1 tablet (10 mg total) by mouth every 6 (six) hours as needed for nausea or vomiting. 30 tablet 0   acetaminophen (TYLENOL) 500 MG tablet Take 500 mg by mouth every 6 (six) hours as needed for mild pain or headache.      cetirizine (ZYRTEC) 5 MG tablet Take 5 mg by mouth daily as needed for allergies.      cholecalciferol (VITAMIN D) 1000 UNITS tablet Take 1,000 Units by mouth every morning.      Cyanocobalamin (VITAMIN B 12 PO) Take 1,000 mcg by mouth daily.     ELDERBERRY PO Take 1 tablet by mouth daily.     fluticasone (FLONASE) 50 MCG/ACT nasal spray Place into the nose as needed.     lenalidomide (REVLIMID) 25 MG capsule Take 1 capsule (25 mg total) by mouth daily. Take  for 14 days, then none for 7 days. Repeat every 21 days. [employer] Auth # [id number]; Date Obtained 06/11/23 14 capsule 0   rosuvastatin (CRESTOR) 5 MG tablet Take 5 mg by mouth daily.     valACYclovir (VALTREX) 500 MG tablet TAKE 4 TABLETS BY MOUTH EVERY 12 HOURS X 1 DAY FOR FEVER BLISTER AS NEEDED. 30 tablet 2   No current facility-administered medications for this visit.    REVIEW OF SYSTEMS:   Constitutional: ( - ) fevers, ( - )  chills , ( - ) night sweats Eyes: ( - ) blurriness of vision, ( - ) double vision, ( - ) watery eyes Ears, nose, mouth, throat, and face: ( - ) mucositis, ( - ) sore throat Respiratory: ( - ) cough, ( - ) dyspnea, ( - ) wheezes Cardiovascular: ( - ) palpitation, ( - ) chest discomfort, ( - ) lower extremity swelling Gastrointestinal:  ( - ) nausea, ( - ) heartburn, ( - ) change in bowel habits Skin: ( - ) abnormal skin rashes Lymphatics: ( - ) new lymphadenopathy, ( - ) easy bruising Neurological: ( - ) numbness, ( - ) tingling, ( - ) new weaknesses Behavioral/Psych: ( - ) mood change, ( - ) new changes  All other systems were reviewed with the patient and are negative.  PHYSICAL EXAMINATION: ECOG PERFORMANCE STATUS: 0 - Asymptomatic  Vitals:   06/11/23 1146  BP: (!) 133/55  Pulse: 78  Resp: 18  Temp: 98.1 F (36.7 C)  SpO2: 100%   Filed Weights   06/11/23 1146  Weight: 133 lb 9.6 oz (60.6 kg)    GENERAL: Well-appearing elderly Caucasian female, alert, no distress and comfortable SKIN: skin color, texture, turgor are normal, no rashes or significant lesions EYES: conjunctiva are  pink and non-injected, sclera clear LUNGS: clear to auscultation and percussion with normal breathing effort HEART: regular rate & rhythm and no murmurs and no lower extremity edema Musculoskeletal: no cyanosis of digits and no clubbing  PSYCH: alert & oriented x 3, fluent speech NEURO: no focal motor/sensory deficits  LABORATORY DATA:  I have reviewed the data as listed    Latest Ref Rng & Units 06/01/2023   10:10 AM 05/16/2023   11:01 AM 08/24/2021   11:58 AM  CBC  WBC 4.0 - 10.5 K/uL 4.0  4.1  5.4   Hemoglobin 12.0 - 15.0 g/dL 54.0  98.1  19.1   Hematocrit 36.0 - 46.0 % 36.3  33.6  36.4   Platelets 150 - 400 K/uL 102  108  126.0        Latest Ref Rng & Units 05/16/2023   11:01 AM 08/24/2021   11:58 AM 06/29/2021    2:25 PM  CMP  Glucose 70 - 99 mg/dL 87  76  83   BUN 8 - 23 mg/dL 21  19  16    Creatinine 0.44 - 1.00 mg/dL 4.78  2.95  6.21   Sodium 135 - 145 mmol/L 142  143  141   Potassium 3.5 - 5.1 mmol/L 3.8  4.4  3.8   Chloride 98 - 111 mmol/L 107  107  104   CO2 22 - 32 mmol/L 31  31  30    Calcium 8.9 - 10.3 mg/dL 9.3  9.1  8.9   Total Protein 6.5 - 8.1 g/dL 6.2  6.0    Total Bilirubin 0.3 - 1.2 mg/dL 1.3  1.3    Alkaline Phos 38 - 126 U/L 87  78    AST 15 - 41 U/L 18  20    ALT 0 - 44 U/L 16  18      Lab Results  Component Value Date   MPROTEIN 0.1 (H) 05/16/2023   Lab Results  Component Value Date   KPAFRELGTCHN 3.0 (L) 05/16/2023   LAMBDASER 900.1 (H) 05/16/2023   KAPLAMBRATIO 0.00 (L) 05/16/2023    RADIOGRAPHIC STUDIES: CT BONE MARROW BIOPSY & ASPIRATION  Result Date: 06/01/2023 INDICATION: 72 year old female with history of monoclonal gammopathy of uncertain significance. EXAM: CT-GUIDED BONE MARROW BIOPSY AND ASPIRATION MEDICATIONS: None ANESTHESIA/SEDATION: Fentanyl 100 mcg IV; Versed 2 mg IV Sedation Time: 10 minutes; The patient was continuously monitored during the procedure by the interventional radiology nurse under my direct supervision.  COMPLICATIONS: None immediate. PROCEDURE: Informed consent was obtained from the patient following an explanation of the procedure, risks, benefits and alternatives. The patient understands, agrees and consents for the procedure. All questions were addressed. A time out was performed prior to the initiation of the procedure. The patient was positioned prone and non-contrast localization CT was performed of the pelvis to demonstrate the iliac marrow spaces. The operative site was prepped and draped in the usual sterile fashion. Under sterile conditions and local anesthesia, a 22 gauge spinal needle was utilized for procedural planning. Next, an 11 gauge coaxial bone biopsy needle was advanced into the right iliac marrow space. Needle position was confirmed with CT imaging. Initially, a bone marrow aspiration was performed. Next, a bone marrow biopsy was obtained with the 11 gauge outer bone marrow device. Samples were prepared with the cytotechnologist and deemed adequate. The needle was removed and superficial hemostasis was obtained with manual compression. A dressing was applied. The patient tolerated the procedure well without immediate post procedural complication. IMPRESSION: Successful CT guided right iliac bone marrow aspiration and core biopsy. Marliss Coots, MD [facility] Radiology Electronically Signed   By: Marliss Coots M.D.   On: 06/01/2023 16:42    ASSESSMENT & PLAN Brooke Whitaker 72 y.o. female with medical history significant for newly diagnosed multiple myeloma who presents for a follow up visit.   Today we discussed the diagnosis of multiple myeloma.  We discussed that this is a blood cancer that is not curable.  The goal is to get the patient into a durable remission with treatment.  We discussed the need for a minimum of 6 months of chemotherapy treatment then followed by maintenance therapy if the M protein has reached 0 and the free light  chains are within normal limits.  We discussed expected side effects of the regimen and the plan moving forward.  The patient voiced her understanding of our findings, expected side effects, and risks/benefits.  She is okay with proceeding with chemotherapy at this time.  # Free Lambda Multiple Myeloma -- Diagnosis confirmed by the presence of a macrocytic anemia and 80% plasma cells in the bone marrow -- Treatment of choice at this time would consist of VRD chemotherapy. --To complete her workup we will need a UPEP as well as a metastatic survey -- Plan for cycle 1 day 1 to start on 06/25/2023.  Return to clinic at that time to assure we have addressed all last-minute questions and concerns.  #Supportive Care -- chemotherapy education to be scheduled  -- port placement not required  -- ASA 81 mg PO daily to be taken with revlimid.  -- zofran 8mg  q8H PRN and compazine 10mg  PO q6H  for nausea -- acyclovir 400mg  PO BID for VCZ prophylaxis -- allopurinol 300mg  PO daily for TLS prophylaxis -- EMLA cream for port -- no pain medication required at this time.    Orders Placed This Encounter  Procedures   DG Bone Survey Met    Standing Status:   Future    Standing Expiration Date:   06/10/2024    Order Specific Question:   Reason for Exam (SYMPTOM  OR DIAGNOSIS REQUIRED)    Answer:   multiple myeloma, assess for lytic lesions    Order Specific Question:   Preferred imaging location?    Answer:   [facility]   24-Hr Ur UPEP/UIFE/Light Chains/TP    Standing Status:   Future    Standing Expiration Date:   06/10/2024   CBC with Differential (Cancer Center Only)    Standing Status:   Future    Standing Expiration Date:   06/24/2024   CMP (Cancer Center only)    Standing Status:   Future    Standing Expiration Date:   06/24/2024   CBC with Differential (Cancer Center Only)    Standing Status:   Future    Standing Expiration Date:   07/01/2024   CMP (Cancer Center only)    Standing Status:    Future    Standing Expiration Date:   07/01/2024   CBC with Differential (Cancer Center Only)    Standing Status:   Future    Standing Expiration Date:   07/08/2024   CMP (Cancer Center only)    Standing Status:   Future    Standing Expiration Date:   07/08/2024    All questions were answered. The patient knows to call the clinic with any problems, questions or concerns.  A total of more than 30 minutes were spent on this encounter with face-to-face time and non-face-to-face time, including preparing to see the patient, ordering tests and/or medications, counseling the patient and coordination of care as outlined above.   Ulysees Barns, MD Department of Hematology/Oncology [facility] Phone: [phone] Pager: [phone] Email: [email or web address]  06/15/2023 10:17 AM

## 2023-06-11 ENCOUNTER — Other Ambulatory Visit: Payer: Self-pay | Admitting: *Deleted

## 2023-06-11 ENCOUNTER — Inpatient Hospital Stay: Payer: Medicare HMO | Attending: Hematology and Oncology | Admitting: Hematology and Oncology

## 2023-06-11 ENCOUNTER — Encounter: Payer: Self-pay | Admitting: Hematology and Oncology

## 2023-06-11 ENCOUNTER — Telehealth: Payer: Self-pay | Admitting: Pharmacist

## 2023-06-11 ENCOUNTER — Other Ambulatory Visit (HOSPITAL_COMMUNITY): Payer: Self-pay

## 2023-06-11 ENCOUNTER — Telehealth: Payer: Self-pay | Admitting: Pharmacy Technician

## 2023-06-11 ENCOUNTER — Encounter (HOSPITAL_COMMUNITY): Payer: Self-pay | Admitting: Hematology and Oncology

## 2023-06-11 VITALS — BP 133/55 | HR 78 | Temp 98.1°F | Resp 18 | Ht 61.0 in | Wt 133.6 lb

## 2023-06-11 DIAGNOSIS — Z800 Family history of malignant neoplasm of digestive organs: Secondary | ICD-10-CM | POA: Diagnosis not present

## 2023-06-11 DIAGNOSIS — C9000 Multiple myeloma not having achieved remission: Secondary | ICD-10-CM | POA: Diagnosis not present

## 2023-06-11 DIAGNOSIS — Z808 Family history of malignant neoplasm of other organs or systems: Secondary | ICD-10-CM | POA: Diagnosis not present

## 2023-06-11 DIAGNOSIS — D472 Monoclonal gammopathy: Secondary | ICD-10-CM

## 2023-06-11 MED ORDER — ONDANSETRON HCL 8 MG PO TABS: 8.0000 mg | ORAL_TABLET | Freq: Three times a day (TID) | ORAL | 0 refills | Status: AC | PRN

## 2023-06-11 MED ORDER — ALLOPURINOL 300 MG PO TABS: 300.0000 mg | ORAL_TABLET | Freq: Every day | ORAL | 0 refills | Status: DC

## 2023-06-11 MED ORDER — LENALIDOMIDE 25 MG PO CAPS: 25.0000 mg | ORAL_CAPSULE | Freq: Every day | ORAL | 0 refills | Status: DC

## 2023-06-11 MED ORDER — DEXAMETHASONE 4 MG PO TABS: 40.0000 mg | ORAL_TABLET | ORAL | 5 refills | Status: DC

## 2023-06-11 MED ORDER — PROCHLORPERAZINE MALEATE 10 MG PO TABS: 10.0000 mg | ORAL_TABLET | Freq: Four times a day (QID) | ORAL | 0 refills | Status: AC | PRN

## 2023-06-11 MED ORDER — ACYCLOVIR 400 MG PO TABS: 400.0000 mg | ORAL_TABLET | Freq: Two times a day (BID) | ORAL | 1 refills | Status: DC

## 2023-06-11 NOTE — Progress Notes (Signed)
START ON PATHWAY REGIMEN - Multiple Myeloma and Other Plasma Cell Dyscrasias     A cycle is every 21 days:     Bortezomib      Lenalidomide      Dexamethasone   **Always confirm dose/schedule in your pharmacy ordering system**  Patient Characteristics: Multiple Myeloma, Newly Diagnosed, Transplant Eligible, High Risk Disease Classification: Multiple Myeloma Therapeutic Status: Newly Diagnosed R2-ISS Staging: II Is Patient Eligible for Transplant<= Transplant Eligible Risk Status: High Risk Intent of Therapy: Non-Curative / Palliative Intent, Discussed with Patient

## 2023-06-11 NOTE — Telephone Encounter (Signed)
Oral Oncology Patient Advocate Encounter  Prior Authorization for lenalidomide has been approved.    PA# [id number] Effective dates: 06/11/23 through 10/16/23  Patients co-pay is $2,894.47.    Jinger Neighbors, CPhT-Adv Oncology Pharmacy Patient Advocate [facility] Direct Number: [phone]  Fax: [phone]

## 2023-06-11 NOTE — Progress Notes (Signed)
Revlimid teaching and new pt enrollment done.  Oral Chemo  Pharmacy made aware of new enrollment. Prescription provided to oral chemo. Pt is agreeable to this plan.

## 2023-06-11 NOTE — Telephone Encounter (Signed)
Oral Oncology Patient Advocate Encounter   Received notification that prior authorization for lenalidomide is required.   PA submitted on 06/11/23 Key [id number] Status is pending     Jinger Neighbors, CPhT-Adv Oncology Pharmacy Patient Advocate [facility] Direct Number: [phone]  Fax: [phone]

## 2023-06-12 ENCOUNTER — Other Ambulatory Visit: Payer: Self-pay

## 2023-06-12 ENCOUNTER — Telehealth: Payer: Self-pay | Admitting: Hematology and Oncology

## 2023-06-12 MED ORDER — LENALIDOMIDE 25 MG PO CAPS
25.0000 mg | ORAL_CAPSULE | Freq: Every day | ORAL | 0 refills | Status: DC
Start: 2023-06-12 — End: 2023-07-03

## 2023-06-12 NOTE — Telephone Encounter (Signed)
Oral Oncology Pharmacist Encounter  Received new prescription for Revlimid (lenalidomide) for the treatment of multiple myeloma in conjunction with bortezomib and dexamethasone, planned duration until disease progression or unacceptable drug toxicity.  CBC w/ Diff from 06/01/23 and CMP from 05/16/23 assessed, no baseline dose adjustments required at this time. Prescription dose and frequency assessed for appropriateness.  Current medication list in Epic reviewed, no relevant/significant DDIs with Revlimid identified.  Evaluated chart and no patient barriers to medication adherence noted.   Patient agreement for treatment documented in MD note on 06/11/23.  Prescription has been e-scribed to [facility] due to patient's insurance requirements and medication being limited distribution.   Oral Oncology Clinic will continue to follow for insurance authorization, copayment issues, initial counseling and start date.  [name], PharmD, BCPS, BCOP Hematology/Oncology Clinical Pharmacist [name] Oral Chemotherapy Navigation Clinics [phone] 06/12/2023 8:22 AM

## 2023-06-13 ENCOUNTER — Encounter (HOSPITAL_COMMUNITY): Payer: Self-pay | Admitting: Hematology and Oncology

## 2023-06-13 ENCOUNTER — Other Ambulatory Visit: Payer: Self-pay

## 2023-06-14 NOTE — Telephone Encounter (Signed)
Oral Chemotherapy Pharmacist Encounter   Called [facility] to check on status of Revlimid. Representative stated they haven't reached out to patient yet. Request made to expedite process and for [facility] to reach out to patient.   Oral chemotherapy clinic will continue to follow for status of shipment and initial counseling.   [name], PharmD, BCPS, BCOP Hematology/Oncology Clinical Pharmacist [name] Oral Chemotherapy Navigation Clinics [phone] 06/14/2023 11:17 AM

## 2023-06-15 ENCOUNTER — Other Ambulatory Visit: Payer: Self-pay

## 2023-06-15 ENCOUNTER — Ambulatory Visit (HOSPITAL_COMMUNITY)
Admission: RE | Admit: 2023-06-15 | Discharge: 2023-06-15 | Disposition: A | Discharge status: Home / Self Care | Payer: Medicare HMO | Source: Ambulatory Visit | Attending: Hematology and Oncology | Admitting: Hematology and Oncology

## 2023-06-15 ENCOUNTER — Encounter: Payer: Self-pay | Admitting: Hematology and Oncology

## 2023-06-15 DIAGNOSIS — Z808 Family history of malignant neoplasm of other organs or systems: Secondary | ICD-10-CM | POA: Diagnosis not present

## 2023-06-15 DIAGNOSIS — D472 Monoclonal gammopathy: Secondary | ICD-10-CM | POA: Diagnosis not present

## 2023-06-15 DIAGNOSIS — C9000 Multiple myeloma not having achieved remission: Secondary | ICD-10-CM | POA: Diagnosis not present

## 2023-06-15 DIAGNOSIS — Z800 Family history of malignant neoplasm of digestive organs: Secondary | ICD-10-CM | POA: Diagnosis not present

## 2023-06-19 ENCOUNTER — Other Ambulatory Visit (HOSPITAL_COMMUNITY): Payer: Self-pay

## 2023-06-19 ENCOUNTER — Inpatient Hospital Stay: Payer: Medicare HMO | Attending: Hematology and Oncology

## 2023-06-19 ENCOUNTER — Encounter: Payer: Self-pay | Admitting: Hematology and Oncology

## 2023-06-19 DIAGNOSIS — C9000 Multiple myeloma not having achieved remission: Secondary | ICD-10-CM | POA: Insufficient documentation

## 2023-06-19 DIAGNOSIS — Z79899 Other long term (current) drug therapy: Secondary | ICD-10-CM | POA: Insufficient documentation

## 2023-06-19 DIAGNOSIS — Z5112 Encounter for antineoplastic immunotherapy: Secondary | ICD-10-CM | POA: Insufficient documentation

## 2023-06-19 NOTE — Telephone Encounter (Signed)
Oral Chemotherapy Pharmacist Encounter   Called and spoke with patient and was informed [facility] has NOT called patient yet to set up shipment for first cycle of Revlimid.   Conference call made with patient on phone to [facility] to set up shipment (tele: [phone]).  Medication set to deliver to patient's local [facility] ([address]) on 06/20/23.  Patient request call back on 06/20/23 to review Revlimid in more detail and answer any further questions/concerns prior to starting treatment 06/25/23.  [name], PharmD, BCPS, BCOP Hematology/Oncology Clinical Pharmacist [name] and [facility] Oral Chemotherapy Navigation Clinics [phone] 06/19/2023 11:25 AM

## 2023-06-20 LAB — UPEP/UIFE/LIGHT CHAINS/TP, 24-HR UR
% BETA, Urine: 2.6 %
ALPHA 1 URINE: 0.6 %
Albumin, U: 5.8 %
Alpha 2, Urine: 2.1 %
Free Kappa Lt Chains,Ur: 9.98 mg/L (ref 1.17–86.46)
Free Kappa/Lambda Ratio: 0 — ABNORMAL LOW (ref 1.83–14.26)
GAMMA GLOBULIN URINE: 89 %
M-SPIKE %, Urine: 77.2 % — ABNORMAL HIGH
M-Spike, Mg/24 Hr: 2461 mg/(24.h) — ABNORMAL HIGH
Total Protein, Urine-Ur/day: 3187 mg/(24.h) — ABNORMAL HIGH (ref 30–150)
Total Protein, Urine: 236.1 mg/dL
Total Volume: 1350

## 2023-06-20 NOTE — Telephone Encounter (Signed)
Oral Chemotherapy Pharmacist Encounter  I spoke with patient for overview of: Revlimid for the treatment of multiple myeloma in conjunction with bortezomib and dexamethasone, planned duration until disease progression or unacceptable drug toxicity.  Counseled patient on administration, dosing, side effects, monitoring, drug-food interactions, safe handling, storage, and disposal.  Patient will take Revlimid 25mg  capsules, 1 capsule by mouth once daily, without regard to food, with a full glass of water.  Revlimid will be given 14 days on, 7 days off, repeat every 21 days.  Patient will take dexamethasone 4mg  tablets, 10 tablets (40mg ) by mouth once weekly with breakfast.  Revlimid start date: 06/25/23 PM  Adverse effects of Revlimid include but are not limited to: GI upset, rash, fatigue, drug fever, and decreased blood counts.   VZV PPX: patient has picked up acyclovir and will start this on 06/24/23. VTE PPX: patient has already started aspirin 81 mg daily - medication list has been updated.  Reviewed with patient importance of keeping a medication schedule and plan for any missed doses. No barriers to medication adherence identified.  Medication reconciliation performed and medication/allergy list updated.  Insurance authorization for Revlimid has been obtained.  Revlimid prescription is [facility] as it is a limited distribution medication.  All questions answered.  Brooke Whitaker voiced understanding and appreciation.   Medication education handout placed in mail for patient. Patient knows to call the office with questions or concerns. Oral Chemotherapy Clinic phone number provided to patient.   [name], PharmD, BCPS, [facility] Hematology/Oncology Clinical Pharmacist [name] Oral Chemotherapy Navigation Clinics [phone] 06/20/2023 12:51 PM

## 2023-06-22 ENCOUNTER — Telehealth: Payer: Self-pay

## 2023-06-22 ENCOUNTER — Encounter: Payer: Self-pay | Admitting: Hematology and Oncology

## 2023-06-22 NOTE — Telephone Encounter (Signed)
TCT to pt regarding a MyChart message with questions about x-ray results and medications. Pt informed that x-ray results are not yet available and will hopefully be back to discuss with Dr. Leonides Schanz during her appt for Monday 06/25/2023. Pt educated to proceed with starting Allopurinol and Zovarax. Pt educated that the Allopurinol is for tumor lysis to help control toxins in the body responding to breakdown from the treatment. Pt also educated that the Zovarax is an antiviral medication used to take to help prevent shingles uoon starting Revlimid tx.    Pt stated understanding for the purpose of these medications and to proceed with starting the Allopurinol and Zovarax on Sunday. Pt also informed this RN that she has started taking the aspirin a week ago and this RN informed her that it is appropriate due to her starting Revlimid this upcoming week.    Pt told to please contact us if she has any further questions.

## 2023-06-25 ENCOUNTER — Inpatient Hospital Stay: Payer: Medicare HMO | Admitting: Hematology and Oncology

## 2023-06-25 ENCOUNTER — Inpatient Hospital Stay: Payer: Medicare HMO

## 2023-06-25 ENCOUNTER — Other Ambulatory Visit: Payer: Self-pay | Admitting: *Deleted

## 2023-06-25 VITALS — BP 142/62 | HR 88 | Temp 98.2°F | Resp 18

## 2023-06-25 VITALS — BP 146/67 | HR 91 | Temp 98.0°F | Resp 16 | Wt 135.2 lb

## 2023-06-25 DIAGNOSIS — C9000 Multiple myeloma not having achieved remission: Secondary | ICD-10-CM

## 2023-06-25 DIAGNOSIS — D696 Thrombocytopenia, unspecified: Secondary | ICD-10-CM

## 2023-06-25 DIAGNOSIS — Z5112 Encounter for antineoplastic immunotherapy: Secondary | ICD-10-CM | POA: Diagnosis not present

## 2023-06-25 DIAGNOSIS — D539 Nutritional anemia, unspecified: Secondary | ICD-10-CM | POA: Diagnosis not present

## 2023-06-25 DIAGNOSIS — Z79899 Other long term (current) drug therapy: Secondary | ICD-10-CM | POA: Diagnosis not present

## 2023-06-25 LAB — CBC WITH DIFFERENTIAL (CANCER CENTER ONLY)
Abs Immature Granulocytes: 0.04 10*3/uL (ref 0.00–0.07)
Basophils Absolute: 0 10*3/uL (ref 0.0–0.1)
Basophils Relative: 0 %
Eosinophils Absolute: 0 10*3/uL (ref 0.0–0.5)
Eosinophils Relative: 1 %
HCT: 35.5 % — ABNORMAL LOW (ref 36.0–46.0)
Hemoglobin: 11.4 g/dL — ABNORMAL LOW (ref 12.0–15.0)
Immature Granulocytes: 1 %
Lymphocytes Relative: 33 %
Lymphs Abs: 1.5 10*3/uL (ref 0.7–4.0)
MCH: 32.7 pg (ref 26.0–34.0)
MCHC: 32.1 g/dL (ref 30.0–36.0)
MCV: 101.7 fL — ABNORMAL HIGH (ref 80.0–100.0)
Monocytes Absolute: 0.2 10*3/uL (ref 0.1–1.0)
Monocytes Relative: 3 %
Neutro Abs: 2.9 10*3/uL (ref 1.7–7.7)
Neutrophils Relative %: 62 %
Platelet Count: 102 10*3/uL — ABNORMAL LOW (ref 150–400)
RBC: 3.49 MIL/uL — ABNORMAL LOW (ref 3.87–5.11)
RDW: 14.3 % (ref 11.5–15.5)
WBC Count: 4.7 10*3/uL (ref 4.0–10.5)
nRBC: 0.4 % — ABNORMAL HIGH (ref 0.0–0.2)

## 2023-06-25 LAB — CMP (CANCER CENTER ONLY)
ALT: 17 U/L (ref 0–44)
AST: 20 U/L (ref 15–41)
Albumin: 4.6 g/dL (ref 3.5–5.0)
Alkaline Phosphatase: 88 U/L (ref 38–126)
Anion gap: 4 — ABNORMAL LOW (ref 5–15)
BUN: 19 mg/dL (ref 8–23)
CO2: 32 mmol/L (ref 22–32)
Calcium: 9.4 mg/dL (ref 8.9–10.3)
Chloride: 108 mmol/L (ref 98–111)
Creatinine: 0.82 mg/dL (ref 0.44–1.00)
GFR, Estimated: >60 mL/min (ref >60)
Glucose, Bld: 105 mg/dL — ABNORMAL HIGH (ref 70–99)
Potassium: 3.9 mmol/L (ref 3.5–5.1)
Sodium: 144 mmol/L (ref 135–145)
Total Bilirubin: 1.6 mg/dL — ABNORMAL HIGH (ref 0.3–1.2)
Total Protein: 6.3 g/dL — ABNORMAL LOW (ref 6.5–8.1)

## 2023-06-25 MED ORDER — DEXAMETHASONE 4 MG PO TABS: 40.0000 mg | ORAL_TABLET | ORAL | 5 refills | Status: DC

## 2023-06-25 MED ORDER — BORTEZOMIB CHEMO SQ INJECTION 3.5 MG (2.5MG/ML)
1.3000 mg/m2 | Freq: Once | INTRAMUSCULAR | Status: AC
  Administered 2023-06-25: 2 mg via SUBCUTANEOUS
  Filled 2023-06-25: qty 0.8

## 2023-06-25 NOTE — Patient Instructions (Signed)
[facility]  Discharge Instructions: Thank you for choosing [facility] to provide your oncology and hematology care.   If you have a lab appointment with the Cancer Center, please go directly to the Cancer Center and check in at the registration area.   Wear comfortable clothing and clothing appropriate for easy access to any Portacath or PICC line.   We strive to give you quality time with your provider. You may need to reschedule your appointment if you arrive late (15 or more minutes).  Arriving late affects you and other patients whose appointments are after yours.  Also, if you miss three or more appointments without notifying the office, you may be dismissed from the clinic at the provider's discretion.      For prescription refill requests, have your pharmacy contact our office and allow 72 hours for refills to be completed.    Today you received the following chemotherapy and/or immunotherapy agents: Velcade        To help prevent nausea and vomiting after your treatment, we encourage you to take your nausea medication as directed.  BELOW ARE SYMPTOMS THAT SHOULD BE REPORTED IMMEDIATELY: *FEVER GREATER THAN 100.4 F (38 C) OR HIGHER *CHILLS OR SWEATING *NAUSEA AND VOMITING THAT IS NOT CONTROLLED WITH YOUR NAUSEA MEDICATION *UNUSUAL SHORTNESS OF BREATH *UNUSUAL BRUISING OR BLEEDING *URINARY PROBLEMS (pain or burning when urinating, or frequent urination) *BOWEL PROBLEMS (unusual diarrhea, constipation, pain near the anus) TENDERNESS IN MOUTH AND THROAT WITH OR WITHOUT PRESENCE OF ULCERS (sore throat, sores in mouth, or a toothache) UNUSUAL RASH, SWELLING OR PAIN  UNUSUAL VAGINAL DISCHARGE OR ITCHING   Items with * indicate a potential emergency and should be followed up as soon as possible or go to the Emergency Department if any problems should occur.  Please show the CHEMOTHERAPY ALERT CARD or IMMUNOTHERAPY ALERT CARD at  check-in to the Emergency Department and triage nurse.  Should you have questions after your visit or need to cancel or reschedule your appointment, please contact [facility]  Dept: [phone]  and follow the prompts.  Office hours are 8:00 a.m. to 4:30 p.m. Monday - Friday. Please note that voicemails left after 4:00 p.m. may not be returned until the following business day.  We are closed weekends and major holidays. You have access to a nurse at all times for urgent questions. Please call the main number to the clinic Dept: [phone] and follow the prompts.   For any non-urgent questions, you may also contact your provider using MyChart. We now offer e-Visits for anyone 18 and older to request care online for non-urgent symptoms. For details visit [email or web address].   Also download the MyChart app! Go to the app store, search "MyChart", open the app, select [facility], and log in with your MyChart username and password.  

## 2023-06-25 NOTE — Progress Notes (Signed)
Ok to treat with T bili of 1.6 per Dr. Leonides Schanz

## 2023-06-26 ENCOUNTER — Telehealth: Payer: Self-pay | Admitting: *Deleted

## 2023-06-26 ENCOUNTER — Encounter: Payer: Self-pay | Admitting: Hematology and Oncology

## 2023-06-26 NOTE — Telephone Encounter (Signed)
Called pt to see how she did with her treatment yest.  She reports not sleeping well but attributes to steroids.  She started her Revlimid also.  She did take her compazine last night apophylactically but thinks she will try without it tonight but take if needed.  She denies any other problems but asked if she could take fiber.  Encouraged to get as much fiber in her diet as possible if problems with constipation but OK to take fiber if needed & to discuss with Dr Leonides Schanz if needed extra help.  She knows how to reach [place] if needed & knows her next appts.

## 2023-06-26 NOTE — Telephone Encounter (Signed)
-----   Message from Ulysees Barns IV sent at 06/26/2023 10:13 AM EDT ----- Please let Brooke Whitaker know that her X-ray did not show any evidence of damage to her bones from the multiple myeloma. Overall the scan looked excellent. We will see her back next week for continued myeloma treatment. ----- Message ----- From: Interface, Rad Results In Sent: 06/26/2023  10:00 AM EDT To: Jaci Standard, MD

## 2023-06-26 NOTE — Telephone Encounter (Signed)
-----   Message from Nurse Barbara Cower E sent at 06/25/2023  2:29 PM EDT ----- Regarding: first time treatment call back- dorsey- velcade First time velcade today she is followed by Leonides Schanz. All went well with no issues :)

## 2023-06-26 NOTE — Telephone Encounter (Signed)
Notified of message below

## 2023-06-26 NOTE — Progress Notes (Signed)
[facility] Telephone:([phone]) [phone]   [phone]  PROGRESS NOTE  Patient Care Team: Rodrigo Ran, MD as PCP - General (Internal Medicine)  Hematological/Oncological History # Free Lambda Multiple Myeloma 11/10/2022: WBC 5.68, Hgb 11.7, MCV 97.4, Plt 146 04/17/2023: WBC 4.53, Hgb 11.5, MCV 107.6, Plt 110 05/16/2023: establish care with Dr. Leonides Schanz. Labs showed M protein 0.1 with Lambda 900, Kappa 3 with ratio 0.00.   06/01/2023: bone marrow biopsy performed, showed plasma cell myeloma nearly effacing approximately 80% of the cellular marrow.   Interval History:  Brooke Whitaker 72 y.o. female with medical history significant for newly diagnosed multiple myeloma who presents for a follow up visit. The patient's last visit was on 06/11/2023. In the interim since the last visit she pleated chemotherapy education and presents for cycle 1 day 1 of VRD chemotherapy today.  On exam today Mrs. Malonson is accompanied by her husband.  She reports that she has been well overall in the interim her last visit.  She thought chemotherapy education was enlightening and answered most of her questions..  She has had no recent issues with infectious symptom such as runny nose, sore throat, or cough.  She denies any fevers, chills, sweats, nausea, vomiting, or diarrhea.  A full 10 point ROS is otherwise negative.  The bulk of our discussion focused on assuring the patient had everything necessary to start treatment and had the opportunity to ask any questions or concerns before the start of therapy.  Details of this conversation are noted below.  The patient voiced her understanding of our plan moving forward.  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    Sigmoid diverticulosis    Wears glasses     SURGICAL  HISTORY: Past Surgical History:  Procedure Laterality Date   BREAST BIOPSY Right 09/2011   benign    COLONOSCOPY  2010   CYSTOSCOPY/RETROGRADE/URETEROSCOPY/STONE EXTRACTION WITH BASKET Left 04/21/2014   Procedure: cystoscopy, left retrograde pylegram, LEFT URETEROSCOPY, laser lithotripsy with holmium laser, STONE EXTRACTION, uretheral calibration;  Surgeon: Anner Crete, MD;  Location: [facility];  Service: Urology;  Laterality: Left;   DILATION AND EVACUATION  1995   EPISIOTOMY REPAIR, SEVERE TEAR  1989   LAPAROSCOPIC OVARIAN CYSTECTOMY  10/ 2000   NM MYOCAR PERF WALL MOTION  12/15/2009   Normal   US ECHOCARDIOGRAPHY  12/15/2009   Normal study for age: mild MR,TR and trace PI    SOCIAL HISTORY: Social History   Socioeconomic History   Marital status: Married    Spouse name: Not on file   Number of children: 2   Years of education: Not on file   Highest education level: Not on file  Occupational History   Occupation: Print production planner  Tobacco Use   Smoking status: Never    Passive exposure: Never   Smokeless tobacco: Never  Vaping Use   Vaping status: Never Used  Substance and Sexual Activity   Alcohol use: Yes    Alcohol/week: 2.0 standard drinks of alcohol    Types: 2 Glasses of wine per week   Drug use: No   Sexual activity: Not Currently    Partners: Male  Other Topics Concern   Not on file  Social History Narrative   Not  on file   Social Determinants of Health   Financial Resource Strain: Not on file  Food Insecurity: No Food Insecurity (05/16/2023)   Hunger Vital Sign    Worried About Running Out of Food in the Last Year: Never true    Ran Out of Food in the Last Year: Never true  Transportation Needs: No Transportation Needs (05/16/2023)   PRAPARE - Administrator, Civil Service (Medical): No    Lack of Transportation (Non-Medical): No  Physical Activity: Not on file  Stress: Not on file  Social Connections: Not on file  Intimate  Partner Violence: Not At Risk (05/16/2023)   Humiliation, Afraid, Rape, and Kick questionnaire    Fear of Current or Ex-Partner: No    Emotionally Abused: No    Physically Abused: No    Sexually Abused: No    FAMILY HISTORY: Family History  Problem Relation Age of Onset   Diabetes Mother    Congestive Heart Failure Mother    Stroke Mother    Osteoporosis Mother    Other Mother        gallbladder ruptured   Heart attack Father    Diabetes Sister    Heart disease Sister    Heart disease Sister        x2   Hypertension Sister    Thyroid disease Sister    Hypertension Sister    Diabetes Brother    Dementia Brother    Colon cancer Brother        mets to liver   Heart disease Brother        pacemaker   Stroke Brother    Liver cancer Brother    Heart disease Brother    Stomach cancer Neg Hx    Pancreatic cancer Neg Hx    Esophageal cancer Neg Hx     ALLERGIES:  is allergic to penicillins and sulfa antibiotics.  MEDICATIONS:  Current Outpatient Medications  Medication Sig Dispense Refill   acetaminophen (TYLENOL) 500 MG tablet Take 500 mg by mouth every 6 (six) hours as needed for mild pain or headache.      acyclovir (ZOVIRAX) 400 MG tablet Take 1 tablet (400 mg total) by mouth 2 (two) times daily. 180 tablet 1   allopurinol (ZYLOPRIM) 300 MG tablet Take 1 tablet (300 mg total) by mouth daily. 90 tablet 0   aspirin EC 81 MG tablet Take 81 mg by mouth daily. Swallow whole.     cetirizine (ZYRTEC) 5 MG tablet Take 5 mg by mouth daily as needed for allergies.     cholecalciferol (VITAMIN D) 1000 UNITS tablet Take 1,000 Units by mouth every morning.      Cyanocobalamin (VITAMIN B 12 PO) Take 1,000 mcg by mouth daily.     dexamethasone (DECADRON) 4 MG tablet Take 10 tablets (40 mg total) by mouth once a week. Take once weekly on the day you receive the chemotherapy injection. 40 tablet 5   ELDERBERRY PO Take 1 tablet by mouth daily.     fluticasone (FLONASE) 50 MCG/ACT nasal  spray Place into the nose as needed.     lenalidomide (REVLIMID) 25 MG capsule Take 1 capsule (25 mg total) by mouth daily. Take for 14 days, then none for 7 days. Repeat every 21 days. [employer] Auth # [id number]; Date Obtained 06/11/23 14 capsule 0   ondansetron (ZOFRAN) 8 MG tablet Take 1 tablet (8 mg total) by mouth every 8 (eight) hours as needed. 30 tablet 0  prochlorperazine (COMPAZINE) 10 MG tablet Take 1 tablet (10 mg total) by mouth every 6 (six) hours as needed for nausea or vomiting. 30 tablet 0   rosuvastatin (CRESTOR) 5 MG tablet Take 5 mg by mouth daily.     valACYclovir (VALTREX) 500 MG tablet TAKE 4 TABLETS BY MOUTH EVERY 12 HOURS X 1 DAY FOR FEVER BLISTER AS NEEDED. 30 tablet 2   No current facility-administered medications for this visit.    REVIEW OF SYSTEMS:   Constitutional: ( - ) fevers, ( - )  chills , ( - ) night sweats Eyes: ( - ) blurriness of vision, ( - ) double vision, ( - ) watery eyes Ears, nose, mouth, throat, and face: ( - ) mucositis, ( - ) sore throat Respiratory: ( - ) cough, ( - ) dyspnea, ( - ) wheezes Cardiovascular: ( - ) palpitation, ( - ) chest discomfort, ( - ) lower extremity swelling Gastrointestinal:  ( - ) nausea, ( - ) heartburn, ( - ) change in bowel habits Skin: ( - ) abnormal skin rashes Lymphatics: ( - ) new lymphadenopathy, ( - ) easy bruising Neurological: ( - ) numbness, ( - ) tingling, ( - ) new weaknesses Behavioral/Psych: ( - ) mood change, ( - ) new changes  All other systems were reviewed with the patient and are negative.  PHYSICAL EXAMINATION: ECOG PERFORMANCE STATUS: 0 - Asymptomatic  Vitals:   06/25/23 1237  BP: (!) 146/67  Pulse: 91  Resp: 16  Temp: 98 F (36.7 C)  SpO2: 98%   Filed Weights   06/25/23 1237  Weight: 135 lb 3.2 oz (61.3 kg)    GENERAL: Well-appearing elderly Caucasian female, alert, no distress and comfortable SKIN: skin color, texture, turgor are normal, no rashes or significant lesions EYES:  conjunctiva are pink and non-injected, sclera clear LUNGS: clear to auscultation and percussion with normal breathing effort HEART: regular rate & rhythm and no murmurs and no lower extremity edema Musculoskeletal: no cyanosis of digits and no clubbing  PSYCH: alert & oriented x 3, fluent speech NEURO: no focal motor/sensory deficits  LABORATORY DATA:  I have reviewed the data as listed    Latest Ref Rng & Units 06/25/2023   11:16 AM 06/01/2023   10:10 AM 05/16/2023   11:01 AM  CBC  WBC 4.0 - 10.5 K/uL 4.7  4.0  4.1   Hemoglobin 12.0 - 15.0 g/dL 44.0  10.2  72.5   Hematocrit 36.0 - 46.0 % 35.5  36.3  33.6   Platelets 150 - 400 K/uL 102  102  108        Latest Ref Rng & Units 06/25/2023   11:16 AM 05/16/2023   11:01 AM 08/24/2021   11:58 AM  CMP  Glucose 70 - 99 mg/dL 366  87  76   BUN 8 - 23 mg/dL 19  21  19    Creatinine 0.44 - 1.00 mg/dL 4.40  3.47  4.25   Sodium 135 - 145 mmol/L 144  142  143   Potassium 3.5 - 5.1 mmol/L 3.9  3.8  4.4   Chloride 98 - 111 mmol/L 108  107  107   CO2 22 - 32 mmol/L 32  31  31   Calcium 8.9 - 10.3 mg/dL 9.4  9.3  9.1   Total Protein 6.5 - 8.1 g/dL 6.3  6.2  6.0   Total Bilirubin 0.3 - 1.2 mg/dL 1.6  1.3  1.3   Alkaline Phos 38 -  126 U/L 88  87  78   AST 15 - 41 U/L 20  18  20    ALT 0 - 44 U/L 17  16  18      Lab Results  Component Value Date   MPROTEIN 0.1 (H) 05/16/2023   Lab Results  Component Value Date   KPAFRELGTCHN 3.0 (L) 05/16/2023   LAMBDASER 900.1 (H) 05/16/2023   KAPLAMBRATIO 0.00 (L) 06/15/2023   KAPLAMBRATIO 0.00 (L) 05/16/2023    RADIOGRAPHIC STUDIES: DG Bone Survey Met  Result Date: 06/26/2023 CLINICAL DATA:  Multiple myeloma.  Assess for lytic lesions. EXAM: METASTATIC BONE SURVEY COMPARISON:  Prior imaging including CT abdomen pelvis, 07/06/2021, MR of the abdomen, 06/04/2022. FINDINGS: There are no osteolytic or osteoblastic bone lesions. Specifically, no lytic lesions are visualized to indicate multiple myeloma.  Predominantly concentric right hip joint space narrowing with subchondral sclerosis and marginal osteophytes consistent with moderate degenerative/arthropathic change. IMPRESSION: 1. No lytic bone lesions to suggest multiple myeloma. Electronically Signed   By: Amie Portland M.D.   On: 06/26/2023 09:58   CT BONE MARROW BIOPSY & ASPIRATION  Result Date: 06/01/2023 INDICATION: 72 year old female with history of monoclonal gammopathy of uncertain significance. EXAM: CT-GUIDED BONE MARROW BIOPSY AND ASPIRATION MEDICATIONS: None ANESTHESIA/SEDATION: Fentanyl 100 mcg IV; Versed 2 mg IV Sedation Time: 10 minutes; The patient was continuously monitored during the procedure by the interventional radiology nurse under my direct supervision. COMPLICATIONS: None immediate. PROCEDURE: Informed consent was obtained from the patient following an explanation of the procedure, risks, benefits and alternatives. The patient understands, agrees and consents for the procedure. All questions were addressed. A time out was performed prior to the initiation of the procedure. The patient was positioned prone and non-contrast localization CT was performed of the pelvis to demonstrate the iliac marrow spaces. The operative site was prepped and draped in the usual sterile fashion. Under sterile conditions and local anesthesia, a 22 gauge spinal needle was utilized for procedural planning. Next, an 11 gauge coaxial bone biopsy needle was advanced into the right iliac marrow space. Needle position was confirmed with CT imaging. Initially, a bone marrow aspiration was performed. Next, a bone marrow biopsy was obtained with the 11 gauge outer bone marrow device. Samples were prepared with the cytotechnologist and deemed adequate. The needle was removed and superficial hemostasis was obtained with manual compression. A dressing was applied. The patient tolerated the procedure well without immediate post procedural complication. IMPRESSION:  Successful CT guided right iliac bone marrow aspiration and core biopsy. Marliss Coots, MD [facility] Electronically Signed   By: Marliss Coots M.D.   On: 06/01/2023 16:42    ASSESSMENT & PLAN Brooke Whitaker 72 y.o. female with medical history significant for newly diagnosed multiple myeloma who presents for a follow up visit.   Previously we discussed the diagnosis of multiple myeloma.  We discussed that this is a blood cancer that is not curable.  The goal is to get the patient into a durable remission with treatment.  We discussed the need for a minimum of 6 months of chemotherapy treatment then followed by maintenance therapy if the M protein has reached 0 and the free light chains are within normal limits.  We discussed expected side effects of the regimen and the plan moving forward.  The patient voiced her understanding of our findings, expected side effects, and risks/benefits.  She is okay with proceeding with chemotherapy at this time.  # Free Lambda Multiple  Myeloma -- Diagnosis confirmed by the presence of a macrocytic anemia and 80% plasma cells in the bone marrow -- Treatment of choice at this time would consist of VRD chemotherapy. --UPEP showed marked Bence Jones proteins in the urine, no evidence of lytic lesions on metastatic survey --Today is cycle 1 day 1 of VRD chemotherapy --Labs today show white blood cell 4.7, hemoglobin 11.4, MCV 101.7, and platelets of 102. Cr 0.82, LFTs WNL.  -- Plan for return to clinic for cycle 1 day 15   #Supportive Care -- chemotherapy education complete -- port placement not required  -- ASA 81 mg PO daily to be taken with revlimid.  -- zofran 8mg  q8H PRN and compazine 10mg  PO q6H for nausea -- acyclovir 400mg  PO BID for VCZ prophylaxis -- allopurinol 300mg  PO daily for TLS prophylaxis -- EMLA cream for port --Will discuss the need for Zometa treatment at next visit. -- no pain  medication required at this time.    No orders of the defined types were placed in this encounter.   All questions were answered. The patient knows to call the clinic with any problems, questions or concerns.  A total of more than 30 minutes were spent on this encounter with face-to-face time and non-face-to-face time, including preparing to see the patient, ordering tests and/or medications, counseling the patient and coordination of care as outlined above.   Ulysees Barns, MD Department of Hematology/Oncology [facility] Phone: [phone] Pager: [phone] Email: [email or web address]  06/26/2023 4:48 PM

## 2023-06-29 ENCOUNTER — Telehealth: Payer: Self-pay

## 2023-06-29 NOTE — Telephone Encounter (Signed)
Brooke Whitaker wanted to know if she could be around her 71 year old grandson.  Told her that is fine as long as he is not sick. She wore a Whitaker to pick him up from school.   She stated that she had a BM yesterday but it was an effort to pass stool. She had a salad today and a lot of water.. She tends to have loose stools or diarrhea from a digestive issue. She usually moves bowels daily. Suggested she take a capful of Miralax in a hot beverage tonight and repeat in the am if no BM. She could use a Doculax mid day if no BM. Repeat Miralax tomorrow evening if needed.  Suggested she discuss bowel regimen with Dr. Derek Mound nurse as the medications she is taking may be contributing to constipation issue. Brooke Schulke verbalized understanding.

## 2023-07-02 ENCOUNTER — Other Ambulatory Visit: Payer: Medicare HMO

## 2023-07-02 ENCOUNTER — Ambulatory Visit: Payer: Medicare HMO | Admitting: Hematology and Oncology

## 2023-07-02 ENCOUNTER — Inpatient Hospital Stay: Payer: Medicare HMO

## 2023-07-02 ENCOUNTER — Encounter: Payer: Self-pay | Admitting: Hematology and Oncology

## 2023-07-02 ENCOUNTER — Inpatient Hospital Stay (HOSPITAL_BASED_OUTPATIENT_CLINIC_OR_DEPARTMENT_OTHER): Payer: Medicare HMO

## 2023-07-02 VITALS — BP 119/44 | HR 77 | Temp 98.2°F | Resp 18 | Wt 134.8 lb

## 2023-07-02 DIAGNOSIS — C9000 Multiple myeloma not having achieved remission: Secondary | ICD-10-CM

## 2023-07-02 DIAGNOSIS — Z5112 Encounter for antineoplastic immunotherapy: Secondary | ICD-10-CM | POA: Diagnosis not present

## 2023-07-02 DIAGNOSIS — Z79899 Other long term (current) drug therapy: Secondary | ICD-10-CM | POA: Diagnosis not present

## 2023-07-02 LAB — CBC WITH DIFFERENTIAL (CANCER CENTER ONLY)
Abs Immature Granulocytes: 0.06 10*3/uL (ref 0.00–0.07)
Basophils Absolute: 0 10*3/uL (ref 0.0–0.1)
Basophils Relative: 0 %
Eosinophils Absolute: 0 10*3/uL (ref 0.0–0.5)
Eosinophils Relative: 0 %
HCT: 36.5 % (ref 36.0–46.0)
Hemoglobin: 12.2 g/dL (ref 12.0–15.0)
Immature Granulocytes: 1 %
Lymphocytes Relative: 22 %
Lymphs Abs: 1.2 10*3/uL (ref 0.7–4.0)
MCH: 33.4 pg (ref 26.0–34.0)
MCHC: 33.4 g/dL (ref 30.0–36.0)
MCV: 100 fL (ref 80.0–100.0)
Monocytes Absolute: 0 10*3/uL — ABNORMAL LOW (ref 0.1–1.0)
Monocytes Relative: 1 %
Neutro Abs: 4.1 10*3/uL (ref 1.7–7.7)
Neutrophils Relative %: 76 %
Platelet Count: 137 10*3/uL — ABNORMAL LOW (ref 150–400)
RBC: 3.65 MIL/uL — ABNORMAL LOW (ref 3.87–5.11)
RDW: 14.4 % (ref 11.5–15.5)
WBC Count: 5.5 10*3/uL (ref 4.0–10.5)
nRBC: 0 % (ref 0.0–0.2)

## 2023-07-02 LAB — CMP (CANCER CENTER ONLY)
ALT: 18 U/L (ref 0–44)
AST: 14 U/L — ABNORMAL LOW (ref 15–41)
Albumin: 4.7 g/dL (ref 3.5–5.0)
Alkaline Phosphatase: 89 U/L (ref 38–126)
Anion gap: 7 (ref 5–15)
BUN: 22 mg/dL (ref 8–23)
CO2: 29 mmol/L (ref 22–32)
Calcium: 9.4 mg/dL (ref 8.9–10.3)
Chloride: 103 mmol/L (ref 98–111)
Creatinine: 0.81 mg/dL (ref 0.44–1.00)
GFR, Estimated: >60 mL/min (ref >60)
Glucose, Bld: 171 mg/dL — ABNORMAL HIGH (ref 70–99)
Potassium: 4.2 mmol/L (ref 3.5–5.1)
Sodium: 139 mmol/L (ref 135–145)
Total Bilirubin: 2 mg/dL — ABNORMAL HIGH (ref 0.3–1.2)
Total Protein: 6.5 g/dL (ref 6.5–8.1)

## 2023-07-02 MED ORDER — BORTEZOMIB CHEMO SQ INJECTION 3.5 MG (2.5MG/ML)
1.3000 mg/m2 | Freq: Once | INTRAMUSCULAR | Status: AC
  Administered 2023-07-02: 2 mg via SUBCUTANEOUS
  Filled 2023-07-02: qty 0.8

## 2023-07-02 NOTE — Patient Instructions (Signed)
[facility]  Discharge Instructions: Thank you for choosing [facility] to provide your oncology and hematology care.   If you have a lab appointment with the Cancer Center, please go directly to the Cancer Center and check in at the registration area.   Wear comfortable clothing and clothing appropriate for easy access to any Portacath or PICC line.   We strive to give you quality time with your provider. You may need to reschedule your appointment if you arrive late (15 or more minutes).  Arriving late affects you and other patients whose appointments are after yours.  Also, if you miss three or more appointments without notifying the office, you may be dismissed from the clinic at the provider's discretion.      For prescription refill requests, have your pharmacy contact our office and allow 72 hours for refills to be completed.    Today you received the following chemotherapy and/or immunotherapy agents: Velcade     To help prevent nausea and vomiting after your treatment, we encourage you to take your nausea medication as directed.  BELOW ARE SYMPTOMS THAT SHOULD BE REPORTED IMMEDIATELY: *FEVER GREATER THAN 100.4 F (38 C) OR HIGHER *CHILLS OR SWEATING *NAUSEA AND VOMITING THAT IS NOT CONTROLLED WITH YOUR NAUSEA MEDICATION *UNUSUAL SHORTNESS OF BREATH *UNUSUAL BRUISING OR BLEEDING *URINARY PROBLEMS (pain or burning when urinating, or frequent urination) *BOWEL PROBLEMS (unusual diarrhea, constipation, pain near the anus) TENDERNESS IN MOUTH AND THROAT WITH OR WITHOUT PRESENCE OF ULCERS (sore throat, sores in mouth, or a toothache) UNUSUAL RASH, SWELLING OR PAIN  UNUSUAL VAGINAL DISCHARGE OR ITCHING   Items with * indicate a potential emergency and should be followed up as soon as possible or go to the Emergency Department if any problems should occur.  Please show the CHEMOTHERAPY ALERT CARD or IMMUNOTHERAPY ALERT CARD at check-in  to the Emergency Department and triage nurse.  Should you have questions after your visit or need to cancel or reschedule your appointment, please contact [facility]  Dept: [phone]  and follow the prompts.  Office hours are 8:00 a.m. to 4:30 p.m. Monday - Friday. Please note that voicemails left after 4:00 p.m. may not be returned until the following business day.  We are closed weekends and major holidays. You have access to a nurse at all times for urgent questions. Please call the main number to the clinic Dept: [phone] and follow the prompts.   For any non-urgent questions, you may also contact your provider using MyChart. We now offer e-Visits for anyone 47 and older to request care online for non-urgent symptoms. For details visit [email or web address].   Also download the MyChart app! Go to the app store, search "MyChart", open the app, select [place], and log in with your MyChart username and password.

## 2023-07-02 NOTE — Progress Notes (Signed)
Patient states she took dexamethaone 40 mg at home today. Per Dr Leonides Schanz, ok to treat with bilirubin of 2.0 today.

## 2023-07-03 ENCOUNTER — Other Ambulatory Visit: Payer: Self-pay | Admitting: Hematology and Oncology

## 2023-07-03 ENCOUNTER — Encounter: Payer: Self-pay | Admitting: Hematology and Oncology

## 2023-07-03 ENCOUNTER — Other Ambulatory Visit: Payer: Self-pay | Admitting: *Deleted

## 2023-07-03 DIAGNOSIS — C9000 Multiple myeloma not having achieved remission: Secondary | ICD-10-CM

## 2023-07-03 MED ORDER — LENALIDOMIDE 25 MG PO CAPS: 25.0000 mg | ORAL_CAPSULE | Freq: Every day | ORAL | 0 refills | Status: DC

## 2023-07-03 NOTE — Telephone Encounter (Signed)
We have been seeing an increase in allergy symptoms this summer for unclear reasons. I would recommend trying the Zyrtec therapy first to see if it is effective. If it is not working we can discuss further at our next clinic visit.

## 2023-07-04 ENCOUNTER — Encounter: Payer: Self-pay | Admitting: Hematology and Oncology

## 2023-07-06 ENCOUNTER — Telehealth: Payer: Self-pay | Admitting: *Deleted

## 2023-07-06 NOTE — Telephone Encounter (Signed)
Received call from pt. She nstaes the rash is spreading. It is every where except her face and her chest. She has taken the benadryl at night, pepcid BID and zyrtec. She is also using Hydrocortisone cream esp. on her back. Discussed with Dr. Leonides Schanz. He recommends stopping the Revlimid at this time. He will discuss things further with her at her next visit on 07/16/23. Pt voiced understanding. Call made to [facility] to hold next delivery of Revlimid that was due to be delivered on 07/12/23

## 2023-07-13 ENCOUNTER — Encounter: Payer: Self-pay | Admitting: Hematology and Oncology

## 2023-07-15 NOTE — Progress Notes (Unsigned)
[facility] [phone]   [phone]  PROGRESS NOTE  Patient Care Team: Rodrigo Ran, MD as PCP - General (Internal Medicine)  Hematological/Oncological History # Free Lambda Multiple Myeloma 11/10/2022: WBC 5.68, Hgb 11.7, MCV 97.4, Plt 146 04/17/2023: WBC 4.53, Hgb 11.5, MCV 107.6, Plt 110 05/16/2023: establish care with Dr. Leonides Schanz. Labs showed M protein 0.1 with Lambda 900, Kappa 3 with ratio 0.00.   06/01/2023: bone marrow biopsy performed, showed plasma cell myeloma nearly effacing approximately 80% of the cellular marrow.   Interval History:  Brooke Whitaker 72 y.o. female with medical history significant for newly diagnosed multiple myeloma who presents for a follow up visit. The patient's last visit was on 06/25/2023. In the interim since the last visit she pleated chemotherapy education and presents for cycle 1 day 15 of VRD chemotherapy today.  On exam today Brooke Whitaker is accompanied by her daughter.  She reports she developed a rash with her Revlimid.  She reports that it was predominantly on her arms, chest, and thighs.  It was not painful or itchy but she was taking Benadryl at night and using cortisone cream.  It was "very red".  She notes that after she stopped the Revlimid the rash dissipated within a few days.  She notes that there is no residual rash at this time.  She is agreeable to starting Revlimid at a lower dose.  She reports that she has had some trouble sleeping as well as sensitivity to smell, but no changes in taste.  She denies any numbness or tingling of her fingers and toes.  She reports no nausea, vomiting, or diarrhea and reports that she is having some constipation.  She is not having any runny nose, sore throat, or cough.  She is tolerating the shots well and is not having any issues at the site of injection.  She denies any fevers, chills, sweats, nausea, vomiting, or diarrhea.  A full 10 point ROS is otherwise negative.  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    Sigmoid diverticulosis    Wears glasses     SURGICAL HISTORY: Past Surgical History:  Procedure Laterality Date   BREAST BIOPSY Right 09/2011   benign    COLONOSCOPY  2010   CYSTOSCOPY/RETROGRADE/URETEROSCOPY/STONE EXTRACTION WITH BASKET Left 04/21/2014   Procedure: cystoscopy, left retrograde pylegram, LEFT URETEROSCOPY, laser lithotripsy with holmium laser, STONE EXTRACTION, uretheral calibration;  Surgeon: Anner Crete, MD;  Location: [facility];  Service: Urology;  Laterality: Left;   DILATION AND EVACUATION  1995   EPISIOTOMY REPAIR, SEVERE TEAR  1989   LAPAROSCOPIC OVARIAN CYSTECTOMY  10/ 2000   NM MYOCAR PERF WALL MOTION  12/15/2009   Normal   US ECHOCARDIOGRAPHY  12/15/2009   Normal study for age: mild MR,TR and trace PI    SOCIAL HISTORY: Social History   Socioeconomic History   Marital status: Married    Spouse name: Not on file   Number of children: 2   Years of education: Not on file   Highest education level: Not on file  Occupational History   Occupation: Print production planner  Tobacco Use   Smoking status: Never    Passive exposure: Never   Smokeless tobacco: Never  Vaping Use  Vaping status: Never Used  Substance and Sexual Activity   Alcohol use: Yes    Alcohol/week: 2.0 standard drinks of alcohol    Types: 2 Glasses of wine per week   Drug use: No   Sexual activity: Not Currently    Partners: Male  Other Topics Concern   Not on file  Social History Narrative   Not on file   Social Determinants of Health   Financial Resource Strain: Not on file  Food Insecurity: No Food Insecurity (05/16/2023)   Hunger Vital Sign    Worried About Running Out of Food in the Last Year: Never true    Ran Out of Food in the Last Year: Never  true  Transportation Needs: No Transportation Needs (05/16/2023)   PRAPARE - Administrator, Civil Service (Medical): No    Lack of Transportation (Non-Medical): No  Physical Activity: Not on file  Stress: Not on file  Social Connections: Not on file  Intimate Partner Violence: Not At Risk (05/16/2023)   Humiliation, Afraid, Rape, and Kick questionnaire    Fear of Current or Ex-Partner: No    Emotionally Abused: No    Physically Abused: No    Sexually Abused: No    FAMILY HISTORY: Family History  Problem Relation Age of Onset   Diabetes Mother    Congestive Heart Failure Mother    Stroke Mother    Osteoporosis Mother    Other Mother        gallbladder ruptured   Heart attack Father    Diabetes Sister    Heart disease Sister    Heart disease Sister        x2   Hypertension Sister    Thyroid disease Sister    Hypertension Sister    Diabetes Brother    Dementia Brother    Colon cancer Brother        mets to liver   Heart disease Brother        pacemaker   Stroke Brother    Liver cancer Brother    Heart disease Brother    Stomach cancer Neg Hx    Pancreatic cancer Neg Hx    Esophageal cancer Neg Hx     ALLERGIES:  is allergic to penicillins and sulfa antibiotics.  MEDICATIONS:  Current Outpatient Medications  Medication Sig Dispense Refill   acetaminophen (TYLENOL) 500 MG tablet Take 500 mg by mouth every 6 (six) hours as needed for mild pain or headache.      acyclovir (ZOVIRAX) 400 MG tablet Take 1 tablet (400 mg total) by mouth 2 (two) times daily. 180 tablet 1   allopurinol (ZYLOPRIM) 300 MG tablet Take 1 tablet (300 mg total) by mouth daily. 90 tablet 0   aspirin EC 81 MG tablet Take 81 mg by mouth daily. Swallow whole.     cetirizine (ZYRTEC) 5 MG tablet Take 5 mg by mouth daily as needed for allergies.     cholecalciferol (VITAMIN D) 1000 UNITS tablet Take 1,000 Units by mouth every morning.      Cyanocobalamin (VITAMIN B 12 PO) Take 1,000 mcg by  mouth daily.     dexamethasone (DECADRON) 4 MG tablet Take 10 tablets (40 mg total) by mouth once a week. Take once weekly on the day you receive the chemotherapy injection. 40 tablet 5   ELDERBERRY PO Take 1 tablet by mouth daily.     fluticasone (FLONASE) 50 MCG/ACT nasal spray Place into the nose as needed.  lenalidomide (REVLIMID) 10 MG capsule Take 1 capsule (10 mg total) by mouth daily. [employer] Auth # [id number]     Date Obtained 07/03/23  Take 1 capsule by mouth daily for 14 days then none for 7 days 14 capsule 0   ondansetron (ZOFRAN) 8 MG tablet Take 1 tablet (8 mg total) by mouth every 8 (eight) hours as needed. 30 tablet 0   prochlorperazine (COMPAZINE) 10 MG tablet Take 1 tablet (10 mg total) by mouth every 6 (six) hours as needed for nausea or vomiting. 30 tablet 0   rosuvastatin (CRESTOR) 5 MG tablet Take 5 mg by mouth daily.     valACYclovir (VALTREX) 500 MG tablet TAKE 4 TABLETS BY MOUTH EVERY 12 HOURS X 1 DAY FOR FEVER BLISTER AS NEEDED. 30 tablet 2   No current facility-administered medications for this visit.    REVIEW OF SYSTEMS:   Constitutional: ( - ) fevers, ( - )  chills , ( - ) night sweats Eyes: ( - ) blurriness of vision, ( - ) double vision, ( - ) watery eyes Ears, nose, mouth, throat, and face: ( - ) mucositis, ( - ) sore throat Respiratory: ( - ) cough, ( - ) dyspnea, ( - ) wheezes Cardiovascular: ( - ) palpitation, ( - ) chest discomfort, ( - ) lower extremity swelling Gastrointestinal:  ( - ) nausea, ( - ) heartburn, ( - ) change in bowel habits Skin: ( - ) abnormal skin rashes Lymphatics: ( - ) new lymphadenopathy, ( - ) easy bruising Neurological: ( - ) numbness, ( - ) tingling, ( - ) new weaknesses Behavioral/Psych: ( - ) mood change, ( - ) new changes  All other systems were reviewed with the patient and are negative.  PHYSICAL EXAMINATION: ECOG PERFORMANCE STATUS: 0 - Asymptomatic  Vitals:   07/16/23 1459  BP: (!) 133/57  Pulse: 85  Resp: 16   Temp: 97.8 F (36.6 C)  SpO2: 99%    Filed Weights   07/16/23 1459  Weight: 136 lb 8 oz (61.9 kg)     GENERAL: Well-appearing elderly Caucasian female, alert, no distress and comfortable SKIN: skin color, texture, turgor are normal, no rashes or significant lesions EYES: conjunctiva are pink and non-injected, sclera clear LUNGS: clear to auscultation and percussion with normal breathing effort HEART: regular rate & rhythm and no murmurs and no lower extremity edema Musculoskeletal: no cyanosis of digits and no clubbing  PSYCH: alert & oriented x 3, fluent speech NEURO: no focal motor/sensory deficits  LABORATORY DATA:  I have reviewed the data as listed    Latest Ref Rng & Units 07/16/2023    2:11 PM 07/02/2023    2:33 PM 06/25/2023   11:16 AM  CBC  WBC 4.0 - 10.5 K/uL 3.6  5.5  4.7   Hemoglobin 12.0 - 15.0 g/dL 47.8  29.5  62.1   Hematocrit 36.0 - 46.0 % 37.7  36.5  35.5   Platelets 150 - 400 K/uL 140  137  102        Latest Ref Rng & Units 07/16/2023    2:11 PM 07/02/2023    2:33 PM 06/25/2023   11:16 AM  CMP  Glucose 70 - 99 mg/dL 308  657  846   BUN 8 - 23 mg/dL 18  22  19    Creatinine 0.44 - 1.00 mg/dL 9.62  9.52  8.41   Sodium 135 - 145 mmol/L 141  139  144   Potassium 3.5 -  5.1 mmol/L 4.0  4.2  3.9   Chloride 98 - 111 mmol/L 106  103  108   CO2 22 - 32 mmol/L 29  29  32   Calcium 8.9 - 10.3 mg/dL 9.5  9.4  9.4   Total Protein 6.5 - 8.1 g/dL 6.5  6.5  6.3   Total Bilirubin 0.3 - 1.2 mg/dL 1.2  2.0  1.6   Alkaline Phos 38 - 126 U/L 111  89  88   AST 15 - 41 U/L 20  14  20    ALT 0 - 44 U/L 19  18  17      Lab Results  Component Value Date   MPROTEIN 0.1 (H) 05/16/2023   Lab Results  Component Value Date   KPAFRELGTCHN 3.0 (L) 05/16/2023   LAMBDASER 900.1 (H) 05/16/2023   KAPLAMBRATIO 0.00 (L) 06/15/2023   KAPLAMBRATIO 0.00 (L) 05/16/2023    RADIOGRAPHIC STUDIES: No results found.  ASSESSMENT & PLAN Brooke Whitaker 72 y.o. female with medical history  significant for newly diagnosed multiple myeloma who presents for a follow up visit.   Previously we discussed the diagnosis of multiple myeloma.  We discussed that this is a blood cancer that is not curable.  The goal is to get the patient into a durable remission with treatment.  We discussed the need for a minimum of 6 months of chemotherapy treatment then followed by maintenance therapy if the M protein has reached 0 and the free light chains are within normal limits.  We discussed expected side effects of the regimen and the plan moving forward.  The patient voiced her understanding of our findings, expected side effects, and risks/benefits.  She is okay with proceeding with chemotherapy at this time.  # Free Lambda Multiple Myeloma -- Diagnosis confirmed by the presence of a macrocytic anemia and 80% plasma cells in the bone marrow -- Treatment of choice at this time would consist of VRD chemotherapy. --UPEP showed marked Bence Jones proteins in the urine, no evidence of lytic lesions on metastatic survey --Today is cycle 1 day 1 of VRD chemotherapy --Labs today show white blood cell 3.6, hemoglobin 12.3, MCV 100.3, and platelets of 140. Cr 0.72, LFTs WNL.  -- Plan for return to clinic for cycle 2 day 1   #Supportive Care -- chemotherapy education complete -- port placement not required  -- ASA 81 mg PO daily to be taken with revlimid.  -- zofran 8mg  q8H PRN and compazine 10mg  PO q6H for nausea -- acyclovir 400mg  PO BID for VCZ prophylaxis -- allopurinol 300mg  PO daily for TLS prophylaxis -- EMLA cream for port --Will discuss the need for Zometa treatment at next visit. -- no pain medication required at this time.    Orders Placed This Encounter  Procedures   CBC with Differential (Cancer Center Only)    Standing Status:   Future    Standing Expiration Date:   07/16/2024   CMP (Cancer Center only)    Standing Status:   Future    Standing Expiration Date:   07/16/2024   CBC with  Differential (Cancer Center Only)    Standing Status:   Future    Standing Expiration Date:   07/23/2024   CMP (Cancer Center only)    Standing Status:   Future    Standing Expiration Date:   07/23/2024   CBC with Differential (Cancer Center Only)    Standing Status:   Future    Standing Expiration Date:  07/30/2024   CMP (Cancer Center only)    Standing Status:   Future    Standing Expiration Date:   07/30/2024   CBC with Differential (Cancer Center Only)    Standing Status:   Future    Standing Expiration Date:   08/06/2024   CMP (Cancer Center only)    Standing Status:   Future    Standing Expiration Date:   08/06/2024   CBC with Differential (Cancer Center Only)    Standing Status:   Future    Standing Expiration Date:   08/13/2024   CMP (Cancer Center only)    Standing Status:   Future    Standing Expiration Date:   08/13/2024   CBC with Differential (Cancer Center Only)    Standing Status:   Future    Standing Expiration Date:   08/20/2024   CMP (Cancer Center only)    Standing Status:   Future    Standing Expiration Date:   08/20/2024   CBC with Differential (Cancer Center Only)    Standing Status:   Future    Standing Expiration Date:   08/27/2024   CMP (Cancer Center only)    Standing Status:   Future    Standing Expiration Date:   08/27/2024   CBC with Differential (Cancer Center Only)    Standing Status:   Future    Standing Expiration Date:   09/03/2024   CMP (Cancer Center only)    Standing Status:   Future    Standing Expiration Date:   09/03/2024   CBC with Differential (Cancer Center Only)    Standing Status:   Future    Standing Expiration Date:   09/10/2024   CMP (Cancer Center only)    Standing Status:   Future    Standing Expiration Date:   09/10/2024    All questions were answered. The patient knows to call the clinic with any problems, questions or concerns.  A total of more than 30 minutes were spent on this encounter with face-to-face time and  non-face-to-face time, including preparing to see the patient, ordering tests and/or medications, counseling the patient and coordination of care as outlined above.   Ulysees Barns, MD Department of Hematology/Oncology [facility] at [facility] Phone: [phone] Pager: [phone] Email: [email or web address]  07/17/2023 12:52 PM

## 2023-07-16 ENCOUNTER — Inpatient Hospital Stay: Payer: Medicare HMO

## 2023-07-16 ENCOUNTER — Inpatient Hospital Stay: Payer: Medicare HMO | Admitting: Hematology and Oncology

## 2023-07-16 ENCOUNTER — Other Ambulatory Visit: Payer: Self-pay | Admitting: *Deleted

## 2023-07-16 ENCOUNTER — Encounter: Payer: Self-pay | Admitting: Hematology and Oncology

## 2023-07-16 VITALS — BP 133/57 | HR 85 | Temp 97.8°F | Resp 16 | Wt 136.5 lb

## 2023-07-16 DIAGNOSIS — C9000 Multiple myeloma not having achieved remission: Secondary | ICD-10-CM

## 2023-07-16 DIAGNOSIS — Z79899 Other long term (current) drug therapy: Secondary | ICD-10-CM | POA: Diagnosis not present

## 2023-07-16 DIAGNOSIS — D696 Thrombocytopenia, unspecified: Secondary | ICD-10-CM | POA: Diagnosis not present

## 2023-07-16 DIAGNOSIS — D539 Nutritional anemia, unspecified: Secondary | ICD-10-CM | POA: Diagnosis not present

## 2023-07-16 DIAGNOSIS — Z5112 Encounter for antineoplastic immunotherapy: Secondary | ICD-10-CM | POA: Diagnosis not present

## 2023-07-16 LAB — CBC WITH DIFFERENTIAL (CANCER CENTER ONLY)
Abs Immature Granulocytes: 0.02 10*3/uL (ref 0.00–0.07)
Basophils Absolute: 0.1 10*3/uL (ref 0.0–0.1)
Basophils Relative: 2 %
Eosinophils Absolute: 0 10*3/uL (ref 0.0–0.5)
Eosinophils Relative: 1 %
HCT: 37.7 % (ref 36.0–46.0)
Hemoglobin: 12.3 g/dL (ref 12.0–15.0)
Immature Granulocytes: 1 %
Lymphocytes Relative: 38 %
Lymphs Abs: 1.4 10*3/uL (ref 0.7–4.0)
MCH: 32.7 pg (ref 26.0–34.0)
MCHC: 32.6 g/dL (ref 30.0–36.0)
MCV: 100.3 fL — ABNORMAL HIGH (ref 80.0–100.0)
Monocytes Absolute: 0.1 10*3/uL (ref 0.1–1.0)
Monocytes Relative: 3 %
Neutro Abs: 2 10*3/uL (ref 1.7–7.7)
Neutrophils Relative %: 55 %
Platelet Count: 140 10*3/uL — ABNORMAL LOW (ref 150–400)
RBC: 3.76 MIL/uL — ABNORMAL LOW (ref 3.87–5.11)
RDW: 14.5 % (ref 11.5–15.5)
WBC Count: 3.6 10*3/uL — ABNORMAL LOW (ref 4.0–10.5)
nRBC: 0 % (ref 0.0–0.2)

## 2023-07-16 LAB — CMP (CANCER CENTER ONLY)
ALT: 19 U/L (ref 0–44)
AST: 20 U/L (ref 15–41)
Albumin: 4.7 g/dL (ref 3.5–5.0)
Alkaline Phosphatase: 111 U/L (ref 38–126)
Anion gap: 6 (ref 5–15)
BUN: 18 mg/dL (ref 8–23)
CO2: 29 mmol/L (ref 22–32)
Calcium: 9.5 mg/dL (ref 8.9–10.3)
Chloride: 106 mmol/L (ref 98–111)
Creatinine: 0.72 mg/dL (ref 0.44–1.00)
GFR, Estimated: >60 mL/min (ref >60)
Glucose, Bld: 154 mg/dL — ABNORMAL HIGH (ref 70–99)
Potassium: 4 mmol/L (ref 3.5–5.1)
Sodium: 141 mmol/L (ref 135–145)
Total Bilirubin: 1.2 mg/dL (ref 0.3–1.2)
Total Protein: 6.5 g/dL (ref 6.5–8.1)

## 2023-07-16 MED ORDER — BORTEZOMIB CHEMO SQ INJECTION 3.5 MG (2.5MG/ML)
1.3000 mg/m2 | Freq: Once | INTRAMUSCULAR | Status: AC
  Administered 2023-07-16: 2 mg via SUBCUTANEOUS
  Filled 2023-07-16: qty 0.8

## 2023-07-16 MED ORDER — LENALIDOMIDE 10 MG PO CAPS: 10.0000 mg | ORAL_CAPSULE | Freq: Every day | ORAL | 0 refills | Status: DC

## 2023-07-16 NOTE — Progress Notes (Signed)
Pt states that she took steroid prior to arrival

## 2023-07-16 NOTE — Patient Instructions (Signed)
[facility]  Discharge Instructions: Thank you for choosing [facility] to provide your oncology and hematology care.   If you have a lab appointment with the Cancer Center, please go directly to the Cancer Center and check in at the registration area.   Wear comfortable clothing and clothing appropriate for easy access to any Portacath or PICC line.   We strive to give you quality time with your provider. You may need to reschedule your appointment if you arrive late (15 or more minutes).  Arriving late affects you and other patients whose appointments are after yours.  Also, if you miss three or more appointments without notifying the office, you may be dismissed from the clinic at the provider's discretion.      For prescription refill requests, have your pharmacy contact our office and allow 72 hours for refills to be completed.    Today you received the following chemotherapy and/or immunotherapy agents velcade      To help prevent nausea and vomiting after your treatment, we encourage you to take your nausea medication as directed.  BELOW ARE SYMPTOMS THAT SHOULD BE REPORTED IMMEDIATELY: *FEVER GREATER THAN 100.4 F (38 C) OR HIGHER *CHILLS OR SWEATING *NAUSEA AND VOMITING THAT IS NOT CONTROLLED WITH YOUR NAUSEA MEDICATION *UNUSUAL SHORTNESS OF BREATH *UNUSUAL BRUISING OR BLEEDING *URINARY PROBLEMS (pain or burning when urinating, or frequent urination) *BOWEL PROBLEMS (unusual diarrhea, constipation, pain near the anus) TENDERNESS IN MOUTH AND THROAT WITH OR WITHOUT PRESENCE OF ULCERS (sore throat, sores in mouth, or a toothache) UNUSUAL RASH, SWELLING OR PAIN  UNUSUAL VAGINAL DISCHARGE OR ITCHING   Items with * indicate a potential emergency and should be followed up as soon as possible or go to the Emergency Department if any problems should occur.  Please show the CHEMOTHERAPY ALERT CARD or IMMUNOTHERAPY ALERT CARD at check-in  to the Emergency Department and triage nurse.  Should you have questions after your visit or need to cancel or reschedule your appointment, please contact [facility]  Dept: [phone]  and follow the prompts.  Office hours are 8:00 a.m. to 4:30 p.m. Monday - Friday. Please note that voicemails left after 4:00 p.m. may not be returned until the following business day.  We are closed weekends and major holidays. You have access to a nurse at all times for urgent questions. Please call the main number to the clinic Dept: [phone] and follow the prompts.   For any non-urgent questions, you may also contact your provider using MyChart. We now offer e-Visits for anyone 18 and older to request care online for non-urgent symptoms. For details visit [email or web address].   Also download the MyChart app! Go to the app store, search "MyChart", open the app, select [facility], and log in with your MyChart username and password.   

## 2023-07-17 ENCOUNTER — Encounter: Payer: Self-pay | Admitting: Hematology and Oncology

## 2023-07-19 ENCOUNTER — Encounter: Payer: Self-pay | Admitting: Hematology and Oncology

## 2023-07-23 ENCOUNTER — Inpatient Hospital Stay: Payer: Medicare HMO | Attending: Hematology and Oncology

## 2023-07-23 ENCOUNTER — Inpatient Hospital Stay: Payer: Medicare HMO

## 2023-07-23 VITALS — BP 132/58 | HR 72 | Temp 98.2°F | Resp 18 | Wt 136.0 lb

## 2023-07-23 DIAGNOSIS — C9000 Multiple myeloma not having achieved remission: Secondary | ICD-10-CM

## 2023-07-23 DIAGNOSIS — Z5112 Encounter for antineoplastic immunotherapy: Secondary | ICD-10-CM | POA: Diagnosis not present

## 2023-07-23 DIAGNOSIS — Z79899 Other long term (current) drug therapy: Secondary | ICD-10-CM | POA: Diagnosis not present

## 2023-07-23 LAB — CMP (CANCER CENTER ONLY)
ALT: 16 U/L (ref 0–44)
AST: 16 U/L (ref 15–41)
Albumin: 4.6 g/dL (ref 3.5–5.0)
Alkaline Phosphatase: 108 U/L (ref 38–126)
Anion gap: 5 (ref 5–15)
BUN: 20 mg/dL (ref 8–23)
CO2: 30 mmol/L (ref 22–32)
Calcium: 9.5 mg/dL (ref 8.9–10.3)
Chloride: 106 mmol/L (ref 98–111)
Creatinine: 0.75 mg/dL (ref 0.44–1.00)
GFR, Estimated: >60 mL/min (ref >60)
Glucose, Bld: 149 mg/dL — ABNORMAL HIGH (ref 70–99)
Potassium: 3.9 mmol/L (ref 3.5–5.1)
Sodium: 141 mmol/L (ref 135–145)
Total Bilirubin: 1.3 mg/dL — ABNORMAL HIGH (ref 0.3–1.2)
Total Protein: 6.4 g/dL — ABNORMAL LOW (ref 6.5–8.1)

## 2023-07-23 LAB — CBC WITH DIFFERENTIAL (CANCER CENTER ONLY)
Abs Immature Granulocytes: 0.02 10*3/uL (ref 0.00–0.07)
Basophils Absolute: 0.1 10*3/uL (ref 0.0–0.1)
Basophils Relative: 1 %
Eosinophils Absolute: 0 10*3/uL (ref 0.0–0.5)
Eosinophils Relative: 0 %
HCT: 38 % (ref 36.0–46.0)
Hemoglobin: 12.7 g/dL (ref 12.0–15.0)
Immature Granulocytes: 0 %
Lymphocytes Relative: 24 %
Lymphs Abs: 1.3 10*3/uL (ref 0.7–4.0)
MCH: 33 pg (ref 26.0–34.0)
MCHC: 33.4 g/dL (ref 30.0–36.0)
MCV: 98.7 fL (ref 80.0–100.0)
Monocytes Absolute: 0.1 10*3/uL (ref 0.1–1.0)
Monocytes Relative: 2 %
Neutro Abs: 3.9 10*3/uL (ref 1.7–7.7)
Neutrophils Relative %: 73 %
Platelet Count: 163 10*3/uL (ref 150–400)
RBC: 3.85 MIL/uL — ABNORMAL LOW (ref 3.87–5.11)
RDW: 14.2 % (ref 11.5–15.5)
WBC Count: 5.4 10*3/uL (ref 4.0–10.5)
nRBC: 0 % (ref 0.0–0.2)

## 2023-07-23 MED ORDER — BORTEZOMIB CHEMO SQ INJECTION 3.5 MG (2.5MG/ML)
1.3000 mg/m2 | Freq: Once | INTRAMUSCULAR | Status: AC
  Administered 2023-07-23: 2 mg via SUBCUTANEOUS
  Filled 2023-07-23: qty 0.8

## 2023-07-23 NOTE — Progress Notes (Signed)
Patient confirms that they took dexamethasone dose prior to infusion appointment.

## 2023-07-23 NOTE — Patient Instructions (Addendum)
[facility]   Discharge Instructions: Thank you for choosing [facility] to provide your oncology and hematology care.   If you have a lab appointment with the [facility], please go directly to the [facility] and check in at the registration area.   Wear comfortable clothing and clothing appropriate for easy access to any Portacath or PICC line.   We strive to give you quality time with your provider. You may need to reschedule your appointment if you arrive late (15 or more minutes).  Arriving late affects you and other patients whose appointments are after yours.  Also, if you miss three or more appointments without notifying the office, you may be dismissed from the clinic at the provider's discretion.      For prescription refill requests, have your pharmacy contact our office and allow 72 hours for refills to be completed.    Today you received the following chemotherapy and/or immunotherapy agents: Bortezomib (Velcade)      To help prevent nausea and vomiting after your treatment, we encourage you to take your nausea medication as directed.  BELOW ARE SYMPTOMS THAT SHOULD BE REPORTED IMMEDIATELY: *FEVER GREATER THAN 100.4 F (38 C) OR HIGHER *CHILLS OR SWEATING *NAUSEA AND VOMITING THAT IS NOT CONTROLLED WITH YOUR NAUSEA MEDICATION *UNUSUAL SHORTNESS OF BREATH *UNUSUAL BRUISING OR BLEEDING *URINARY PROBLEMS (pain or burning when urinating, or frequent urination) *BOWEL PROBLEMS (unusual diarrhea, constipation, pain near the anus) TENDERNESS IN MOUTH AND THROAT WITH OR WITHOUT PRESENCE OF ULCERS (sore throat, sores in mouth, or a toothache) UNUSUAL RASH, SWELLING OR PAIN  UNUSUAL VAGINAL DISCHARGE OR ITCHING   Items with * indicate a potential emergency and should be followed up as soon as possible or go to the Emergency Department if any problems should occur.  Please show the CHEMOTHERAPY ALERT CARD or IMMUNOTHERAPY ALERT  CARD at check-in to the Emergency Department and triage nurse.  Should you have questions after your visit or need to cancel or reschedule your appointment, please contact [facility]  Dept: [phone]  and follow the prompts.  Office hours are 8:00 a.m. to 4:30 p.m. Monday - Friday. Please note that voicemails left after 4:00 p.m. may not be returned until the following business day.  We are closed weekends and major holidays. You have access to a nurse at all times for urgent questions. Please call the main number to the clinic Dept: [phone] and follow the prompts.   For any non-urgent questions, you may also contact your provider using MyChart. We now offer e-Visits for anyone 33 and older to request care online for non-urgent symptoms. For details visit [email or web address].   Also download the MyChart app! Go to the app store, search "MyChart", open the app, select [facility], and log in with your MyChart username and password.

## 2023-07-30 NOTE — Progress Notes (Unsigned)
[facility] [phone]   [phone]  PROGRESS NOTE  Patient Care Team: Rodrigo Ran, MD as PCP - General (Internal Medicine)  Hematological/Oncological History # Free Lambda Multiple Myeloma 11/10/2022: WBC 5.68, Hgb 11.7, MCV 97.4, Plt 146 04/17/2023: WBC 4.53, Hgb 11.5, MCV 107.6, Plt 110 05/16/2023: establish care with Dr. Leonides Schanz. Labs showed M protein 0.1 with Lambda 900, Kappa 3 with ratio 0.00.   06/01/2023: bone marrow biopsy performed, showed plasma cell myeloma nearly effacing approximately 80% of the cellular marrow.  06/25/2023: Cycle 1 Day 1 of VRD chemotherapy 07/23/2023: Cycle 2 Day 1 of VRD chemotherapy  Interval History:  Brooke Whitaker 72 y.o. female with medical history significant for newly diagnosed multiple myeloma who presents for a follow up visit. The patient's last visit was on 07/16/2023. In the interim since the last visit she completed her first cycle of chemotherapy.  On exam today Brooke Whitaker is accompanied by her son.  She reports that she is tolerating the 10 mg of Revlimid well with no rash though she is having a little bit of pink hue of her skin on the back.  She reports that there is no itching or pain.  She notes that she is tolerating the shots well and is not having any itching or rash at the site of the injection.  She reports that she is not having any nausea, vomiting, or diarrhea, but to the contrary is having more constipation.  She is taking a stool softener and does have MiraLAX available to help if she gets backed up.  She is not having any numbness or tingling of her fingers or toes.  She reports her energy levels are good and her appetite is strong.  She does occasionally have a hoarse voice after chemotherapy but is doing her best to drink plenty of fluids.  She denies any runny nose, sore throat, or cough though she is prone to ragweed allergies.  She denies any fevers, chills, sweats, nausea, vomiting, or diarrhea.   A full 10 point ROS is otherwise negative.  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    Sigmoid diverticulosis    Wears glasses     SURGICAL HISTORY: Past Surgical History:  Procedure Laterality Date   BREAST BIOPSY Right 09/2011   benign    COLONOSCOPY  2010   CYSTOSCOPY/RETROGRADE/URETEROSCOPY/STONE EXTRACTION WITH BASKET Left 04/21/2014   Procedure: cystoscopy, left retrograde pylegram, LEFT URETEROSCOPY, laser lithotripsy with holmium laser, STONE EXTRACTION, uretheral calibration;  Surgeon: Anner Crete, MD;  Location: [facility];  Service: Urology;  Laterality: Left;   DILATION AND EVACUATION  1995   EPISIOTOMY REPAIR, SEVERE TEAR  1989   LAPAROSCOPIC OVARIAN CYSTECTOMY  10/ 2000   NM MYOCAR PERF WALL MOTION  12/15/2009   Normal   US ECHOCARDIOGRAPHY  12/15/2009   Normal study for age: mild MR,TR and trace PI    SOCIAL HISTORY: Social History   Socioeconomic History   Marital status: Married    Spouse name: Not on file   Number of children: 2   Years of education: Not on file   Highest education level: Not on file  Occupational History   Occupation: Print production planner  Tobacco Use   Smoking status: Never  Passive exposure: Never   Smokeless tobacco: Never  Vaping Use   Vaping status: Never Used  Substance and Sexual Activity   Alcohol use: Yes    Alcohol/week: 2.0 standard drinks of alcohol    Types: 2 Glasses of wine per week   Drug use: No   Sexual activity: Not Currently    Partners: Male  Other Topics Concern   Not on file  Social History Narrative   Not on file   Social Determinants of Health   Financial Resource Strain: Not on file  Food Insecurity: No Food Insecurity (05/16/2023)   Hunger Vital Sign    Worried About Running Out of Food in the Last Year:  Never true    Ran Out of Food in the Last Year: Never true  Transportation Needs: No Transportation Needs (05/16/2023)   PRAPARE - Administrator, Civil Service (Medical): No    Lack of Transportation (Non-Medical): No  Physical Activity: Not on file  Stress: Not on file  Social Connections: Not on file  Intimate Partner Violence: Not At Risk (05/16/2023)   Humiliation, Afraid, Rape, and Kick questionnaire    Fear of Current or Ex-Partner: No    Emotionally Abused: No    Physically Abused: No    Sexually Abused: No    FAMILY HISTORY: Family History  Problem Relation Age of Onset   Diabetes Mother    Congestive Heart Failure Mother    Stroke Mother    Osteoporosis Mother    Other Mother        gallbladder ruptured   Heart attack Father    Diabetes Sister    Heart disease Sister    Heart disease Sister        x2   Hypertension Sister    Thyroid disease Sister    Hypertension Sister    Diabetes Brother    Dementia Brother    Colon cancer Brother        mets to liver   Heart disease Brother        pacemaker   Stroke Brother    Liver cancer Brother    Heart disease Brother    Stomach cancer Neg Hx    Pancreatic cancer Neg Hx    Esophageal cancer Neg Hx     ALLERGIES:  is allergic to penicillins and sulfa antibiotics.  MEDICATIONS:  Current Outpatient Medications  Medication Sig Dispense Refill   acetaminophen (TYLENOL) 500 MG tablet Take 500 mg by mouth every 6 (six) hours as needed for mild pain or headache.      acyclovir (ZOVIRAX) 400 MG tablet Take 1 tablet (400 mg total) by mouth 2 (two) times daily. 180 tablet 1   allopurinol (ZYLOPRIM) 300 MG tablet Take 1 tablet (300 mg total) by mouth daily. 90 tablet 0   aspirin EC 81 MG tablet Take 81 mg by mouth daily. Swallow whole.     cetirizine (ZYRTEC) 5 MG tablet Take 5 mg by mouth daily as needed for allergies.     cholecalciferol (VITAMIN D) 1000 UNITS tablet Take 1,000 Units by mouth every morning.       Cyanocobalamin (VITAMIN B 12 PO) Take 1,000 mcg by mouth daily.     dexamethasone (DECADRON) 4 MG tablet Take 10 tablets (40 mg total) by mouth once a week. Take once weekly on the day you receive the chemotherapy injection. 40 tablet 5   ELDERBERRY PO Take 1 tablet by mouth daily.     fluticasone (  FLONASE) 50 MCG/ACT nasal spray Place into the nose as needed.     lenalidomide (REVLIMID) 10 MG capsule Take 1 capsule (10 mg total) by mouth daily. [employer] Auth # [id number]     Date Obtained 07/03/23  Take 1 capsule by mouth daily for 14 days then none for 7 days 14 capsule 0   ondansetron (ZOFRAN) 8 MG tablet Take 1 tablet (8 mg total) by mouth every 8 (eight) hours as needed. 30 tablet 0   prochlorperazine (COMPAZINE) 10 MG tablet Take 1 tablet (10 mg total) by mouth every 6 (six) hours as needed for nausea or vomiting. 30 tablet 0   rosuvastatin (CRESTOR) 5 MG tablet Take 5 mg by mouth daily.     valACYclovir (VALTREX) 500 MG tablet TAKE 4 TABLETS BY MOUTH EVERY 12 HOURS X 1 DAY FOR FEVER BLISTER AS NEEDED. 30 tablet 2   No current facility-administered medications for this visit.    REVIEW OF SYSTEMS:   Constitutional: ( - ) fevers, ( - )  chills , ( - ) night sweats Eyes: ( - ) blurriness of vision, ( - ) double vision, ( - ) watery eyes Ears, nose, mouth, throat, and face: ( - ) mucositis, ( - ) sore throat Respiratory: ( - ) cough, ( - ) dyspnea, ( - ) wheezes Cardiovascular: ( - ) palpitation, ( - ) chest discomfort, ( - ) lower extremity swelling Gastrointestinal:  ( - ) nausea, ( - ) heartburn, ( - ) change in bowel habits Skin: ( - ) abnormal skin rashes Lymphatics: ( - ) new lymphadenopathy, ( - ) easy bruising Neurological: ( - ) numbness, ( - ) tingling, ( - ) new weaknesses Behavioral/Psych: ( - ) mood change, ( - ) new changes  All other systems were reviewed with the patient and are negative.  PHYSICAL EXAMINATION: ECOG PERFORMANCE STATUS: 0 - Asymptomatic  Vitals:    07/31/23 0920  BP: (!) 129/55  Pulse: 72  Resp: 16  Temp: 97.6 F (36.4 C)  SpO2: 96%     Filed Weights   07/31/23 0920  Weight: 136 lb 8 oz (61.9 kg)      GENERAL: Well-appearing elderly Caucasian female, alert, no distress and comfortable SKIN: skin color, texture, turgor are normal, no rashes or significant lesions EYES: conjunctiva are pink and non-injected, sclera clear LUNGS: clear to auscultation and percussion with normal breathing effort HEART: regular rate & rhythm and no murmurs and no lower extremity edema Musculoskeletal: no cyanosis of digits and no clubbing  PSYCH: alert & oriented x 3, fluent speech NEURO: no focal motor/sensory deficits  LABORATORY DATA:  I have reviewed the data as listed    Latest Ref Rng & Units 07/31/2023    8:51 AM 07/23/2023    2:20 PM 07/16/2023    2:11 PM  CBC  WBC 4.0 - 10.5 K/uL 4.9  5.4  3.6   Hemoglobin 12.0 - 15.0 g/dL 24.4  01.0  27.2   Hematocrit 36.0 - 46.0 % 38.9  38.0  37.7   Platelets 150 - 400 K/uL 148  163  140        Latest Ref Rng & Units 07/31/2023    8:51 AM 07/23/2023    2:20 PM 07/16/2023    2:11 PM  CMP  Glucose 70 - 99 mg/dL 74  536  644   BUN 8 - 23 mg/dL 13  20  18    Creatinine 0.44 - 1.00 mg/dL 0.34  0.75  0.72   Sodium 135 - 145 mmol/L 145  141  141   Potassium 3.5 - 5.1 mmol/L 3.6  3.9  4.0   Chloride 98 - 111 mmol/L 108  106  106   CO2 22 - 32 mmol/L 32  30  29   Calcium 8.9 - 10.3 mg/dL 9.4  9.5  9.5   Total Protein 6.5 - 8.1 g/dL 6.0  6.4  6.5   Total Bilirubin 0.3 - 1.2 mg/dL 1.5  1.3  1.2   Alkaline Phos 38 - 126 U/L 98  108  111   AST 15 - 41 U/L 18  16  20    ALT 0 - 44 U/L 19  16  19      Lab Results  Component Value Date   MPROTEIN 0.1 (H) 05/16/2023   Lab Results  Component Value Date   KPAFRELGTCHN 3.0 (L) 05/16/2023   LAMBDASER 900.1 (H) 05/16/2023   KAPLAMBRATIO 0.00 (L) 06/15/2023   KAPLAMBRATIO 0.00 (L) 05/16/2023    RADIOGRAPHIC STUDIES: No results  found.  ASSESSMENT & PLAN Brooke Whitaker 72 y.o. female with medical history significant for newly diagnosed multiple myeloma who presents for a follow up visit.   Previously we discussed the diagnosis of multiple myeloma.  We discussed that this is a blood cancer that is not curable.  The goal is to get the patient into a durable remission with treatment.  We discussed the need for a minimum of 6 months of chemotherapy treatment then followed by maintenance therapy if the M protein has reached 0 and the free light chains are within normal limits.  We discussed expected side effects of the regimen and the plan moving forward.  The patient voiced her understanding of our findings, expected side effects, and risks/benefits.  She is okay with proceeding with chemotherapy at this time.  # Free Lambda Multiple Myeloma -- Diagnosis confirmed by the presence of a macrocytic anemia and 80% plasma cells in the bone marrow -- Treatment of choice at this time would consist of VRD chemotherapy. --UPEP showed marked Bence Jones proteins in the urine, no evidence of lytic lesions on metastatic survey --Today is cycle 2 day 8 of VRD chemotherapy --Labs today show white blood cell 4.9, hemoglobin 12.9, MCV 99.5, and platelets of 148. Cr 0.7, LFTs WNL.  -- Plan for return to clinic for cycle 3 day 1  #Supportive Care -- chemotherapy education complete -- port placement not required  -- ASA 81 mg PO daily to be taken with revlimid.  -- zofran 8mg  q8H PRN and compazine 10mg  PO q6H for nausea -- acyclovir 400mg  PO BID for VCZ prophylaxis -- allopurinol 300mg  PO daily for TLS prophylaxis -- EMLA cream for port --Will discuss the need for Zometa treatment  -- no pain medication required at this time.    Orders Placed This Encounter  Procedures   CBC with Differential (Cancer Center Only)    Standing Status:   Future    Standing Expiration Date:   09/18/2024   CMP (Cancer Center only)    Standing Status:    Future    Standing Expiration Date:   09/18/2024   CBC with Differential (Cancer Center Only)    Standing Status:   Future    Standing Expiration Date:   09/25/2024   CMP (Cancer Center only)    Standing Status:   Future    Standing Expiration Date:   09/25/2024   CBC with Differential (Cancer Center  Only)    Standing Status:   Future    Standing Expiration Date:   10/02/2024   CMP (Cancer Center only)    Standing Status:   Future    Standing Expiration Date:   10/02/2024   CBC with Differential (Cancer Center Only)    Standing Status:   Future    Standing Expiration Date:   10/09/2024   CMP (Cancer Center only)    Standing Status:   Future    Standing Expiration Date:   10/09/2024   CBC with Differential (Cancer Center Only)    Standing Status:   Future    Standing Expiration Date:   10/16/2024   CMP (Cancer Center only)    Standing Status:   Future    Standing Expiration Date:   10/16/2024   CBC with Differential (Cancer Center Only)    Standing Status:   Future    Standing Expiration Date:   10/23/2024   CMP (Cancer Center only)    Standing Status:   Future    Standing Expiration Date:   10/23/2024   Kappa/lambda light chains    Standing Status:   Future    Standing Expiration Date:   07/31/2024   Multiple Myeloma Panel (SPEP&IFE w/QIG)    Standing Status:   Future    Standing Expiration Date:   07/31/2024   Kappa/lambda light chains    Standing Status:   Future    Standing Expiration Date:   08/28/2024   Multiple Myeloma Panel (SPEP&IFE w/QIG)    Standing Status:   Future    Standing Expiration Date:   08/28/2024   Kappa/lambda light chains    Standing Status:   Future    Standing Expiration Date:   09/18/2024   Multiple Myeloma Panel (SPEP&IFE w/QIG)    Standing Status:   Future    Standing Expiration Date:   09/18/2024   Kappa/lambda light chains    Standing Status:   Future    Standing Expiration Date:   10/09/2024   Multiple Myeloma Panel (SPEP&IFE w/QIG)     Standing Status:   Future    Standing Expiration Date:   10/09/2024    All questions were answered. The patient knows to call the clinic with any problems, questions or concerns.  A total of more than 30 minutes were spent on this encounter with face-to-face time and non-face-to-face time, including preparing to see the patient, ordering tests and/or medications, counseling the patient and coordination of care as outlined above.   Ulysees Barns, MD Department of Hematology/Oncology [facility] at [facility] Phone: [phone] Pager: [phone] Email: [email or web address]  07/31/2023 10:16 AM

## 2023-07-31 ENCOUNTER — Inpatient Hospital Stay: Payer: Medicare HMO

## 2023-07-31 ENCOUNTER — Inpatient Hospital Stay (HOSPITAL_BASED_OUTPATIENT_CLINIC_OR_DEPARTMENT_OTHER): Payer: Medicare HMO | Admitting: Hematology and Oncology

## 2023-07-31 VITALS — BP 129/55 | HR 72 | Temp 97.6°F | Resp 16 | Ht 61.0 in | Wt 136.5 lb

## 2023-07-31 DIAGNOSIS — C9000 Multiple myeloma not having achieved remission: Secondary | ICD-10-CM | POA: Diagnosis not present

## 2023-07-31 DIAGNOSIS — Z79899 Other long term (current) drug therapy: Secondary | ICD-10-CM | POA: Diagnosis not present

## 2023-07-31 DIAGNOSIS — Z5112 Encounter for antineoplastic immunotherapy: Secondary | ICD-10-CM | POA: Diagnosis not present

## 2023-07-31 LAB — CMP (CANCER CENTER ONLY)
ALT: 19 U/L (ref 0–44)
AST: 18 U/L (ref 15–41)
Albumin: 4.3 g/dL (ref 3.5–5.0)
Alkaline Phosphatase: 98 U/L (ref 38–126)
Anion gap: 5 (ref 5–15)
BUN: 13 mg/dL (ref 8–23)
CO2: 32 mmol/L (ref 22–32)
Calcium: 9.4 mg/dL (ref 8.9–10.3)
Chloride: 108 mmol/L (ref 98–111)
Creatinine: 0.7 mg/dL (ref 0.44–1.00)
GFR, Estimated: >60 mL/min (ref >60)
Glucose, Bld: 74 mg/dL (ref 70–99)
Potassium: 3.6 mmol/L (ref 3.5–5.1)
Sodium: 145 mmol/L (ref 135–145)
Total Bilirubin: 1.5 mg/dL — ABNORMAL HIGH (ref 0.3–1.2)
Total Protein: 6 g/dL — ABNORMAL LOW (ref 6.5–8.1)

## 2023-07-31 LAB — CBC WITH DIFFERENTIAL (CANCER CENTER ONLY)
Abs Immature Granulocytes: 0.02 10*3/uL (ref 0.00–0.07)
Basophils Absolute: 0.1 10*3/uL (ref 0.0–0.1)
Basophils Relative: 1 %
Eosinophils Absolute: 0.3 10*3/uL (ref 0.0–0.5)
Eosinophils Relative: 6 %
HCT: 38.9 % (ref 36.0–46.0)
Hemoglobin: 12.9 g/dL (ref 12.0–15.0)
Immature Granulocytes: 0 %
Lymphocytes Relative: 35 %
Lymphs Abs: 1.7 10*3/uL (ref 0.7–4.0)
MCH: 33 pg (ref 26.0–34.0)
MCHC: 33.2 g/dL (ref 30.0–36.0)
MCV: 99.5 fL (ref 80.0–100.0)
Monocytes Absolute: 0.2 10*3/uL (ref 0.1–1.0)
Monocytes Relative: 5 %
Neutro Abs: 2.6 10*3/uL (ref 1.7–7.7)
Neutrophils Relative %: 53 %
Platelet Count: 148 10*3/uL — ABNORMAL LOW (ref 150–400)
RBC: 3.91 MIL/uL (ref 3.87–5.11)
RDW: 14.2 % (ref 11.5–15.5)
WBC Count: 4.9 10*3/uL (ref 4.0–10.5)
nRBC: 0 % (ref 0.0–0.2)

## 2023-07-31 MED ORDER — BORTEZOMIB CHEMO SQ INJECTION 3.5 MG (2.5MG/ML)
1.3000 mg/m2 | Freq: Once | INTRAMUSCULAR | Status: AC
  Administered 2023-07-31: 2 mg via SUBCUTANEOUS
  Filled 2023-07-31: qty 0.8

## 2023-07-31 NOTE — Progress Notes (Signed)
Patient took dex before coming to treatment at 8:00 am

## 2023-07-31 NOTE — Patient Instructions (Signed)
[facility]   Discharge Instructions: Thank you for choosing [facility] to provide your oncology and hematology care.   If you have a lab appointment with the Cancer Center, please go directly to the Cancer Center and check in at the registration area.   Wear comfortable clothing and clothing appropriate for easy access to any Portacath or PICC line.   We strive to give you quality time with your provider. You may need to reschedule your appointment if you arrive late (15 or more minutes).  Arriving late affects you and other patients whose appointments are after yours.  Also, if you miss three or more appointments without notifying the office, you may be dismissed from the clinic at the provider's discretion.      For prescription refill requests, have your pharmacy contact our office and allow 72 hours for refills to be completed.    Today you received the following chemotherapy and/or immunotherapy agents: Bortezomib (Velcade)      To help prevent nausea and vomiting after your treatment, we encourage you to take your nausea medication as directed.  BELOW ARE SYMPTOMS THAT SHOULD BE REPORTED IMMEDIATELY: *FEVER GREATER THAN 100.4 F (38 C) OR HIGHER *CHILLS OR SWEATING *NAUSEA AND VOMITING THAT IS NOT CONTROLLED WITH YOUR NAUSEA MEDICATION *UNUSUAL SHORTNESS OF BREATH *UNUSUAL BRUISING OR BLEEDING *URINARY PROBLEMS (pain or burning when urinating, or frequent urination) *BOWEL PROBLEMS (unusual diarrhea, constipation, pain near the anus) TENDERNESS IN MOUTH AND THROAT WITH OR WITHOUT PRESENCE OF ULCERS (sore throat, sores in mouth, or a toothache) UNUSUAL RASH, SWELLING OR PAIN  UNUSUAL VAGINAL DISCHARGE OR ITCHING   Items with * indicate a potential emergency and should be followed up as soon as possible or go to the Emergency Department if any problems should occur.  Please show the CHEMOTHERAPY ALERT CARD or IMMUNOTHERAPY ALERT  CARD at check-in to the Emergency Department and triage nurse.  Should you have questions after your visit or need to cancel or reschedule your appointment, please contact [facility]  Dept: [phone]  and follow the prompts.  Office hours are 8:00 a.m. to 4:30 p.m. Monday - Friday. Please note that voicemails left after 4:00 p.m. may not be returned until the following business day.  We are closed weekends and major holidays. You have access to a nurse at all times for urgent questions. Please call the main number to the clinic Dept: [phone] and follow the prompts.   For any non-urgent questions, you may also contact your provider using MyChart. We now offer e-Visits for anyone 51 and older to request care online for non-urgent symptoms. For details visit [email or web address].   Also download the MyChart app! Go to the app store, search "MyChart", open the app, select , and log in with your MyChart username and password.

## 2023-08-02 ENCOUNTER — Encounter: Payer: Self-pay | Admitting: Hematology and Oncology

## 2023-08-03 ENCOUNTER — Other Ambulatory Visit: Payer: Self-pay | Admitting: Hematology and Oncology

## 2023-08-03 ENCOUNTER — Other Ambulatory Visit: Payer: Self-pay

## 2023-08-03 ENCOUNTER — Telehealth: Payer: Self-pay

## 2023-08-03 MED ORDER — LENALIDOMIDE 10 MG PO CAPS: 10.0000 mg | ORAL_CAPSULE | Freq: Every day | ORAL | 0 refills | Status: DC

## 2023-08-03 NOTE — Telephone Encounter (Signed)
Pt LVM stating that she is having some issues with her bowel movements and was previously considering going to the hospital due to feeling "very impacted."   This RN called pt back. Pt explains that she has had "more issues than normal this week" with her bowel movements. She states that she had tried to take Miralax Wednesday morning with her coffee and was able to pass a small bowel movement.   Pt states that since then she had issues trying to have another bowel movement. Pt states that she had called her daughter over to her house and was successful at manual disimpaction.   Pt states that she has concerns over this happening again. This RN suggested trying other OTC medications such as Dulcolax or suppositories if it continues to happen or if it happens again to where she feels the miralax alone is not helping. This RN also suggested trying diet modification such as adjusting fiber in her diet and seeing if that changes anything for her bowel movements. This RN states that if she does not notice a change or is still having issues, she recommends following up with her PCP regarding this issue.   Pt verbalized understanding.

## 2023-08-06 ENCOUNTER — Other Ambulatory Visit: Payer: Self-pay

## 2023-08-06 ENCOUNTER — Inpatient Hospital Stay: Payer: Medicare HMO

## 2023-08-06 VITALS — BP 140/65 | HR 91 | Temp 97.8°F | Resp 18 | Ht 61.0 in | Wt 136.5 lb

## 2023-08-06 DIAGNOSIS — Z5112 Encounter for antineoplastic immunotherapy: Secondary | ICD-10-CM | POA: Diagnosis not present

## 2023-08-06 DIAGNOSIS — Z79899 Other long term (current) drug therapy: Secondary | ICD-10-CM | POA: Diagnosis not present

## 2023-08-06 DIAGNOSIS — C9000 Multiple myeloma not having achieved remission: Secondary | ICD-10-CM

## 2023-08-06 LAB — CBC WITH DIFFERENTIAL (CANCER CENTER ONLY)
Abs Immature Granulocytes: 0.03 10*3/uL (ref 0.00–0.07)
Basophils Absolute: 0 10*3/uL (ref 0.0–0.1)
Basophils Relative: 0 %
Eosinophils Absolute: 0 10*3/uL (ref 0.0–0.5)
Eosinophils Relative: 0 %
HCT: 40.5 % (ref 36.0–46.0)
Hemoglobin: 13.2 g/dL (ref 12.0–15.0)
Immature Granulocytes: 1 %
Lymphocytes Relative: 15 %
Lymphs Abs: 0.9 10*3/uL (ref 0.7–4.0)
MCH: 32 pg (ref 26.0–34.0)
MCHC: 32.6 g/dL (ref 30.0–36.0)
MCV: 98.1 fL (ref 80.0–100.0)
Monocytes Absolute: 0.1 10*3/uL (ref 0.1–1.0)
Monocytes Relative: 1 %
Neutro Abs: 4.8 10*3/uL (ref 1.7–7.7)
Neutrophils Relative %: 83 %
Platelet Count: 156 10*3/uL (ref 150–400)
RBC: 4.13 MIL/uL (ref 3.87–5.11)
RDW: 14.1 % (ref 11.5–15.5)
WBC Count: 5.7 10*3/uL (ref 4.0–10.5)
nRBC: 0 % (ref 0.0–0.2)

## 2023-08-06 LAB — CMP (CANCER CENTER ONLY)
ALT: 19 U/L (ref 0–44)
AST: 15 U/L (ref 15–41)
Albumin: 4.4 g/dL (ref 3.5–5.0)
Alkaline Phosphatase: 106 U/L (ref 38–126)
Anion gap: 7 (ref 5–15)
BUN: 19 mg/dL (ref 8–23)
CO2: 28 mmol/L (ref 22–32)
Calcium: 9.2 mg/dL (ref 8.9–10.3)
Chloride: 103 mmol/L (ref 98–111)
Creatinine: 0.71 mg/dL (ref 0.44–1.00)
GFR, Estimated: >60 mL/min (ref >60)
Glucose, Bld: 256 mg/dL — ABNORMAL HIGH (ref 70–99)
Potassium: 3.7 mmol/L (ref 3.5–5.1)
Sodium: 138 mmol/L (ref 135–145)
Total Bilirubin: 1.3 mg/dL — ABNORMAL HIGH (ref 0.3–1.2)
Total Protein: 6.4 g/dL — ABNORMAL LOW (ref 6.5–8.1)

## 2023-08-06 MED ORDER — BORTEZOMIB CHEMO SQ INJECTION 3.5 MG (2.5MG/ML)
1.3000 mg/m2 | Freq: Once | INTRAMUSCULAR | Status: AC
  Administered 2023-08-06: 2 mg via SUBCUTANEOUS
  Filled 2023-08-06: qty 0.8

## 2023-08-06 NOTE — Patient Instructions (Signed)
[facility] [place]   Discharge Instructions: Thank you for choosing [facility] to provide your oncology and hematology care.   If you have a lab appointment with the [facility], please go directly to the [facility] and check in at the registration area.   Wear comfortable clothing and clothing appropriate for easy access to any Portacath or PICC line.   We strive to give you quality time with your provider. You may need to reschedule your appointment if you arrive late (15 or more minutes).  Arriving late affects you and other patients whose appointments are after yours.  Also, if you miss three or more appointments without notifying the office, you may be dismissed from the clinic at the provider's discretion.      For prescription refill requests, have your pharmacy contact our office and allow 72 hours for refills to be completed.    Today you received the following chemotherapy and/or immunotherapy agents: Bortezomib (Velcade)      To help prevent nausea and vomiting after your treatment, we encourage you to take your nausea medication as directed.  BELOW ARE SYMPTOMS THAT SHOULD BE REPORTED IMMEDIATELY: *FEVER GREATER THAN 100.4 F (38 C) OR HIGHER *CHILLS OR SWEATING *NAUSEA AND VOMITING THAT IS NOT CONTROLLED WITH YOUR NAUSEA MEDICATION *UNUSUAL SHORTNESS OF BREATH *UNUSUAL BRUISING OR BLEEDING *URINARY PROBLEMS (pain or burning when urinating, or frequent urination) *BOWEL PROBLEMS (unusual diarrhea, constipation, pain near the anus) TENDERNESS IN MOUTH AND THROAT WITH OR WITHOUT PRESENCE OF ULCERS (sore throat, sores in mouth, or a toothache) UNUSUAL RASH, SWELLING OR PAIN  UNUSUAL VAGINAL DISCHARGE OR ITCHING   Items with * indicate a potential emergency and should be followed up as soon as possible or go to the Emergency Department if any problems should occur.  Please show the CHEMOTHERAPY ALERT CARD or IMMUNOTHERAPY ALERT  CARD at check-in to the Emergency Department and triage nurse.  Should you have questions after your visit or need to cancel or reschedule your appointment, please contact [facility]  Dept: [phone]  and follow the prompts.  Office hours are 8:00 a.m. to 4:30 p.m. Monday - Friday. Please note that voicemails left after 4:00 p.m. may not be returned until the following business day.  We are closed weekends and major holidays. You have access to a nurse at all times for urgent questions. Please call the main number to the clinic Dept: [phone] and follow the prompts.   For any non-urgent questions, you may also contact your provider using MyChart. We now offer e-Visits for anyone 33 and older to request care online for non-urgent symptoms. For details visit [email or web address].   Also download the MyChart app! Go to the app store, search "MyChart", open the app, select [facility], and log in with your MyChart username and password.

## 2023-08-07 ENCOUNTER — Encounter: Payer: Self-pay | Admitting: Hematology and Oncology

## 2023-08-13 ENCOUNTER — Inpatient Hospital Stay: Payer: Medicare HMO

## 2023-08-13 ENCOUNTER — Inpatient Hospital Stay: Payer: Medicare HMO | Admitting: Hematology and Oncology

## 2023-08-13 VITALS — BP 128/57 | HR 86 | Temp 97.7°F | Resp 18 | Ht 61.0 in | Wt 137.5 lb

## 2023-08-13 DIAGNOSIS — D539 Nutritional anemia, unspecified: Secondary | ICD-10-CM | POA: Diagnosis not present

## 2023-08-13 DIAGNOSIS — D696 Thrombocytopenia, unspecified: Secondary | ICD-10-CM

## 2023-08-13 DIAGNOSIS — Z79899 Other long term (current) drug therapy: Secondary | ICD-10-CM | POA: Diagnosis not present

## 2023-08-13 DIAGNOSIS — C9000 Multiple myeloma not having achieved remission: Secondary | ICD-10-CM

## 2023-08-13 DIAGNOSIS — Z5112 Encounter for antineoplastic immunotherapy: Secondary | ICD-10-CM | POA: Diagnosis not present

## 2023-08-13 LAB — CBC WITH DIFFERENTIAL (CANCER CENTER ONLY)
Abs Immature Granulocytes: 0.03 10*3/uL (ref 0.00–0.07)
Basophils Absolute: 0 10*3/uL (ref 0.0–0.1)
Basophils Relative: 1 %
Eosinophils Absolute: 0 10*3/uL (ref 0.0–0.5)
Eosinophils Relative: 0 %
HCT: 39.6 % (ref 36.0–46.0)
Hemoglobin: 13.2 g/dL (ref 12.0–15.0)
Immature Granulocytes: 1 %
Lymphocytes Relative: 13 %
Lymphs Abs: 0.9 10*3/uL (ref 0.7–4.0)
MCH: 32.5 pg (ref 26.0–34.0)
MCHC: 33.3 g/dL (ref 30.0–36.0)
MCV: 97.5 fL (ref 80.0–100.0)
Monocytes Absolute: 0.1 10*3/uL (ref 0.1–1.0)
Monocytes Relative: 1 %
Neutro Abs: 5.4 10*3/uL (ref 1.7–7.7)
Neutrophils Relative %: 84 %
Platelet Count: 177 10*3/uL (ref 150–400)
RBC: 4.06 MIL/uL (ref 3.87–5.11)
RDW: 14.2 % (ref 11.5–15.5)
WBC Count: 6.4 10*3/uL (ref 4.0–10.5)
nRBC: 0 % (ref 0.0–0.2)

## 2023-08-13 LAB — CMP (CANCER CENTER ONLY)
ALT: 19 U/L (ref 0–44)
AST: 18 U/L (ref 15–41)
Albumin: 4.5 g/dL (ref 3.5–5.0)
Alkaline Phosphatase: 102 U/L (ref 38–126)
Anion gap: 6 (ref 5–15)
BUN: 20 mg/dL (ref 8–23)
CO2: 29 mmol/L (ref 22–32)
Calcium: 9.2 mg/dL (ref 8.9–10.3)
Chloride: 104 mmol/L (ref 98–111)
Creatinine: 0.75 mg/dL (ref 0.44–1.00)
GFR, Estimated: >60 mL/min (ref >60)
Glucose, Bld: 199 mg/dL — ABNORMAL HIGH (ref 70–99)
Potassium: 3.7 mmol/L (ref 3.5–5.1)
Sodium: 139 mmol/L (ref 135–145)
Total Bilirubin: 1.4 mg/dL — ABNORMAL HIGH (ref 0.3–1.2)
Total Protein: 6.3 g/dL — ABNORMAL LOW (ref 6.5–8.1)

## 2023-08-13 MED ORDER — BORTEZOMIB CHEMO SQ INJECTION 3.5 MG (2.5MG/ML)
1.3000 mg/m2 | Freq: Once | INTRAMUSCULAR | Status: AC
  Administered 2023-08-13: 2 mg via SUBCUTANEOUS
  Filled 2023-08-13: qty 0.8

## 2023-08-13 NOTE — Progress Notes (Unsigned)
[facility] [phone]   [phone]  PROGRESS NOTE  Patient Care Team: Rodrigo Ran, MD as PCP - General (Internal Medicine)  Hematological/Oncological History # Free Lambda Multiple Myeloma 11/10/2022: WBC 5.68, Hgb 11.7, MCV 97.4, Plt 146 04/17/2023: WBC 4.53, Hgb 11.5, MCV 107.6, Plt 110 05/16/2023: establish care with Dr. Leonides Schanz. Labs showed M protein 0.1 with Lambda 900, Kappa 3 with ratio 0.00.   06/01/2023: bone marrow biopsy performed, showed plasma cell myeloma nearly effacing approximately 80% of the cellular marrow.  06/25/2023: Cycle 1 Day 1 of VRD chemotherapy 07/23/2023: Cycle 2 Day 1 of VRD chemotherapy 08/13/2023: Cycle 3 Day 1 of VRD chemotherapy  Interval History:  Brooke Whitaker 72 y.o. female with medical history significant for newly diagnosed multiple myeloma who presents for a follow up visit. The patient's last visit was on 07/16/2023. In the interim since the last visit she completed her 2nd cycle of chemotherapy.  On exam today Brooke Whitaker is tolerating her treatment quite well.  She reports that she is being evaluated by an eye doctor and recently had some inflammation of her left eyelid.  She is going to have this examined by her ophthalmologist.  She reports that she has been taking Benadryl at night as well as senna docusate to help with her bowel movements.  The primary side effect she has been having as a result of her chemotherapy is constipation.  She notes that she will also be undergoing some freezing of skin lesions with her dermatologist in the near future.  She reports overall her energy is good and her appetite is strong.  She is doing her best to prevent future infections by wearing masks when she goes to crowded places like the grocery store.  She denies any fevers, chills, sweats, nausea, vomiting, or diarrhea.  A full 10 point ROS is otherwise negative.  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    Sigmoid diverticulosis    Wears glasses     SURGICAL HISTORY: Past Surgical History:  Procedure Laterality Date   BREAST BIOPSY Right 09/2011   benign    COLONOSCOPY  2010   CYSTOSCOPY/RETROGRADE/URETEROSCOPY/STONE EXTRACTION WITH BASKET Left 04/21/2014   Procedure: cystoscopy, left retrograde pylegram, LEFT URETEROSCOPY, laser lithotripsy with holmium laser, STONE EXTRACTION, uretheral calibration;  Surgeon: Anner Crete, MD;  Location: [facility];  Service: Urology;  Laterality: Left;   DILATION AND EVACUATION  1995   EPISIOTOMY REPAIR, SEVERE TEAR  1989   LAPAROSCOPIC OVARIAN CYSTECTOMY  10/ 2000   NM MYOCAR PERF WALL MOTION  12/15/2009   Normal   US ECHOCARDIOGRAPHY  12/15/2009   Normal study for age: mild MR,TR and trace PI    SOCIAL HISTORY: Social History   Socioeconomic History   Marital status: Married    Spouse name: Not on file   Number of children: 2   Years of education: Not on file   Highest education level: Not on file  Occupational History   Occupation: Print production planner  Tobacco Use   Smoking status: Never    Passive exposure: Never   Smokeless tobacco: Never  Vaping Use   Vaping status: Never Used  Substance and Sexual Activity   Alcohol use: Yes  Alcohol/week: 2.0 standard drinks of alcohol    Types: 2 Glasses of wine per week   Drug use: No   Sexual activity: Not Currently    Partners: Male  Other Topics Concern   Not on file  Social History Narrative   Not on file   Social Determinants of Health   Financial Resource Strain: Not on file  Food Insecurity: No Food Insecurity (05/16/2023)   Hunger Vital Sign    Worried About Running Out of Food in the Last Year: Never true    Ran Out of Food in the Last Year: Never true  Transportation Needs: No Transportation Needs  (05/16/2023)   PRAPARE - Administrator, Civil Service (Medical): No    Lack of Transportation (Non-Medical): No  Physical Activity: Not on file  Stress: Not on file  Social Connections: Not on file  Intimate Partner Violence: Not At Risk (05/16/2023)   Humiliation, Afraid, Rape, and Kick questionnaire    Fear of Current or Ex-Partner: No    Emotionally Abused: No    Physically Abused: No    Sexually Abused: No    FAMILY HISTORY: Family History  Problem Relation Age of Onset   Diabetes Mother    Congestive Heart Failure Mother    Stroke Mother    Osteoporosis Mother    Other Mother        gallbladder ruptured   Heart attack Father    Diabetes Sister    Heart disease Sister    Heart disease Sister        x2   Hypertension Sister    Thyroid disease Sister    Hypertension Sister    Diabetes Brother    Dementia Brother    Colon cancer Brother        mets to liver   Heart disease Brother        pacemaker   Stroke Brother    Liver cancer Brother    Heart disease Brother    Stomach cancer Neg Hx    Pancreatic cancer Neg Hx    Esophageal cancer Neg Hx     ALLERGIES:  is allergic to penicillins and sulfa antibiotics.  MEDICATIONS:  Current Outpatient Medications  Medication Sig Dispense Refill   acetaminophen (TYLENOL) 500 MG tablet Take 500 mg by mouth every 6 (six) hours as needed for mild pain or headache.      acyclovir (ZOVIRAX) 400 MG tablet Take 1 tablet (400 mg total) by mouth 2 (two) times daily. 180 tablet 1   allopurinol (ZYLOPRIM) 300 MG tablet Take 1 tablet (300 mg total) by mouth daily. 90 tablet 0   aspirin EC 81 MG tablet Take 81 mg by mouth daily. Swallow whole.     cetirizine (ZYRTEC) 5 MG tablet Take 5 mg by mouth daily as needed for allergies.     cholecalciferol (VITAMIN D) 1000 UNITS tablet Take 1,000 Units by mouth every morning.      Cyanocobalamin (VITAMIN B 12 PO) Take 1,000 mcg by mouth daily.     dexamethasone (DECADRON) 4 MG  tablet Take 10 tablets (40 mg total) by mouth once a week. Take once weekly on the day you receive the chemotherapy injection. 40 tablet 5   ELDERBERRY PO Take 1 tablet by mouth daily.     fluticasone (FLONASE) 50 MCG/ACT nasal spray Place into the nose as needed.     lenalidomide (REVLIMID) 10 MG capsule Take 1 capsule (10 mg total) by mouth daily. [employer]  [facility] #[id number]    Date Obtained 08/03/23 Take 1 capsule by mouth daily for 14 days then none for 7 days 14 capsule 0   ondansetron (ZOFRAN) 8 MG tablet Take 1 tablet (8 mg total) by mouth every 8 (eight) hours as needed. 30 tablet 0   prochlorperazine (COMPAZINE) 10 MG tablet Take 1 tablet (10 mg total) by mouth every 6 (six) hours as needed for nausea or vomiting. 30 tablet 0   rosuvastatin (CRESTOR) 5 MG tablet Take 5 mg by mouth daily.     valACYclovir (VALTREX) 500 MG tablet TAKE 4 TABLETS BY MOUTH EVERY 12 HOURS X 1 DAY FOR FEVER BLISTER AS NEEDED. 30 tablet 2   No current facility-administered medications for this visit.    REVIEW OF SYSTEMS:   Constitutional: ( - ) fevers, ( - )  chills , ( - ) night sweats Eyes: ( - ) blurriness of vision, ( - ) double vision, ( - ) watery eyes Ears, nose, mouth, throat, and face: ( - ) mucositis, ( - ) sore throat Respiratory: ( - ) cough, ( - ) dyspnea, ( - ) wheezes Cardiovascular: ( - ) palpitation, ( - ) chest discomfort, ( - ) lower extremity swelling Gastrointestinal:  ( - ) nausea, ( - ) heartburn, ( - ) change in bowel habits Skin: ( - ) abnormal skin rashes Lymphatics: ( - ) new lymphadenopathy, ( - ) easy bruising Neurological: ( - ) numbness, ( - ) tingling, ( - ) new weaknesses Behavioral/Psych: ( - ) mood change, ( - ) new changes  All other systems were reviewed with the patient and are negative.  PHYSICAL EXAMINATION: ECOG PERFORMANCE STATUS: 0 - Asymptomatic  There were no vitals filed for this visit.    There were no vitals filed for this visit.     GENERAL:  Well-appearing elderly Caucasian female, alert, no distress and comfortable SKIN: skin color, texture, turgor are normal, no rashes or significant lesions EYES: conjunctiva are pink and non-injected, sclera clear LUNGS: clear to auscultation and percussion with normal breathing effort HEART: regular rate & rhythm and no murmurs and no lower extremity edema Musculoskeletal: no cyanosis of digits and no clubbing  PSYCH: alert & oriented x 3, fluent speech NEURO: no focal motor/sensory deficits  LABORATORY DATA:  I have reviewed the data as listed    Latest Ref Rng & Units 08/13/2023    2:50 PM 08/06/2023    3:03 PM 07/31/2023    8:51 AM  CBC  WBC 4.0 - 10.5 K/uL 6.4  5.7  4.9   Hemoglobin 12.0 - 15.0 g/dL 54.0  98.1  19.1   Hematocrit 36.0 - 46.0 % 39.6  40.5  38.9   Platelets 150 - 400 K/uL 177  156  148        Latest Ref Rng & Units 08/13/2023    2:50 PM 08/06/2023    3:03 PM 07/31/2023    8:51 AM  CMP  Glucose 70 - 99 mg/dL 478  295  74   BUN 8 - 23 mg/dL 20  19  13    Creatinine 0.44 - 1.00 mg/dL 6.21  3.08  6.57   Sodium 135 - 145 mmol/L 139  138  145   Potassium 3.5 - 5.1 mmol/L 3.7  3.7  3.6   Chloride 98 - 111 mmol/L 104  103  108   CO2 22 - 32 mmol/L 29  28  32   Calcium 8.9 - 10.3  mg/dL 9.2  9.2  9.4   Total Protein 6.5 - 8.1 g/dL 6.3  6.4  6.0   Total Bilirubin 0.3 - 1.2 mg/dL 1.4  1.3  1.5   Alkaline Phos 38 - 126 U/L 102  106  98   AST 15 - 41 U/L 18  15  18    ALT 0 - 44 U/L 19  19  19      Lab Results  Component Value Date   MPROTEIN 0.1 (H) 05/16/2023   Lab Results  Component Value Date   KPAFRELGTCHN 3.0 (L) 05/16/2023   LAMBDASER 900.1 (H) 05/16/2023   KAPLAMBRATIO 0.00 (L) 06/15/2023   KAPLAMBRATIO 0.00 (L) 05/16/2023    RADIOGRAPHIC STUDIES: No results found.  ASSESSMENT & PLAN Brooke Whitaker 72 y.o. female with medical history significant for newly diagnosed multiple myeloma who presents for a follow up visit.   Previously we discussed the  diagnosis of multiple myeloma.  We discussed that this is a blood cancer that is not curable.  The goal is to get the patient into a durable remission with treatment.  We discussed the need for a minimum of 6 months of chemotherapy treatment then followed by maintenance therapy if the M protein has reached 0 and the free light chains are within normal limits.  We discussed expected side effects of the regimen and the plan moving forward.  The patient voiced her understanding of our findings, expected side effects, and risks/benefits.  She is okay with proceeding with chemotherapy at this time.  # Free Lambda Multiple Myeloma -- Diagnosis confirmed by the presence of a macrocytic anemia and 80% plasma cells in the bone marrow -- Treatment of choice at this time would consist of VRD chemotherapy. --UPEP showed marked Bence Jones proteins in the urine, no evidence of lytic lesions on metastatic survey --Today is cycle 3 day 1 of VRD chemotherapy --Labs today show white blood cell 6.4, Hgb 13.2, MCV 97.5, Plt 177 -- Plan for return to clinic for cycle 3 day 15  #Supportive Care -- chemotherapy education complete -- port placement not required  -- ASA 81 mg PO daily to be taken with revlimid.  -- zofran 8mg  q8H PRN and compazine 10mg  PO q6H for nausea -- acyclovir 400mg  PO BID for VCZ prophylaxis -- allopurinol 300mg  PO daily for TLS prophylaxis -- EMLA cream for port --Will discuss the need for Zometa treatment  -- no pain medication required at this time.    Orders Placed This Encounter  Procedures   Multiple Myeloma Panel (SPEP&IFE w/QIG)    Standing Status:   Future    Number of Occurrences:   1    Standing Expiration Date:   08/12/2024   Kappa/lambda light chains    Standing Status:   Future    Number of Occurrences:   1    Standing Expiration Date:   08/12/2024   Kappa/lambda light chains    Standing Status:   Future    Standing Expiration Date:   08/28/2024   Multiple Myeloma  Panel (SPEP&IFE w/QIG)    Standing Status:   Future    Standing Expiration Date:   08/28/2024   Kappa/lambda light chains    Standing Status:   Future    Standing Expiration Date:   09/18/2024   Multiple Myeloma Panel (SPEP&IFE w/QIG)    Standing Status:   Future    Standing Expiration Date:   09/18/2024   Kappa/lambda light chains    Standing Status:  Future    Standing Expiration Date:   10/09/2024   Multiple Myeloma Panel (SPEP&IFE w/QIG)    Standing Status:   Future    Standing Expiration Date:   10/09/2024    All questions were answered. The patient knows to call the clinic with any problems, questions or concerns.  A total of more than 30 minutes were spent on this encounter with face-to-face time and non-face-to-face time, including preparing to see the patient, ordering tests and/or medications, counseling the patient and coordination of care as outlined above.   Ulysees Barns, MD Department of Hematology/Oncology [facility] Phone: [phone] Pager: [phone] Email: [email or web address]  08/14/2023 3:26 PM

## 2023-08-13 NOTE — Progress Notes (Signed)
Pt. states she took Decadron 40 mg po this am.

## 2023-08-13 NOTE — Patient Instructions (Signed)
[place] [facility]  Discharge Instructions: Thank you for choosing [facility] to provide your oncology and hematology care.   If you have a lab appointment with the Cancer Center, please go directly to the Cancer Center and check in at the registration area.   Wear comfortable clothing and clothing appropriate for easy access to any Portacath or PICC line.   We strive to give you quality time with your provider. You may need to reschedule your appointment if you arrive late (15 or more minutes).  Arriving late affects you and other patients whose appointments are after yours.  Also, if you miss three or more appointments without notifying the office, you may be dismissed from the clinic at the provider's discretion.      For prescription refill requests, have your pharmacy contact our office and allow 72 hours for refills to be completed.    Today you received the following chemotherapy and/or immunotherapy agent: Bortezomib (Velcade)  To help prevent nausea and vomiting after your treatment, we encourage you to take your nausea medication as directed.  BELOW ARE SYMPTOMS THAT SHOULD BE REPORTED IMMEDIATELY: *FEVER GREATER THAN 100.4 F (38 C) OR HIGHER *CHILLS OR SWEATING *NAUSEA AND VOMITING THAT IS NOT CONTROLLED WITH YOUR NAUSEA MEDICATION *UNUSUAL SHORTNESS OF BREATH *UNUSUAL BRUISING OR BLEEDING *URINARY PROBLEMS (pain or burning when urinating, or frequent urination) *BOWEL PROBLEMS (unusual diarrhea, constipation, pain near the anus) TENDERNESS IN MOUTH AND THROAT WITH OR WITHOUT PRESENCE OF ULCERS (sore throat, sores in mouth, or a toothache) UNUSUAL RASH, SWELLING OR PAIN  UNUSUAL VAGINAL DISCHARGE OR ITCHING   Items with * indicate a potential emergency and should be followed up as soon as possible or go to the Emergency Department if any problems should occur.  Please show the CHEMOTHERAPY ALERT CARD or IMMUNOTHERAPY ALERT CARD at  check-in to the Emergency Department and triage nurse.  Should you have questions after your visit or need to cancel or reschedule your appointment, please contact [place] [facility]  Dept: [phone]  and follow the prompts.  Office hours are 8:00 a.m. to 4:30 p.m. Monday - Friday. Please note that voicemails left after 4:00 p.m. may not be returned until the following business day.  We are closed weekends and major holidays. You have access to a nurse at all times for urgent questions. Please call the main number to the clinic Dept: [phone] and follow the prompts.   For any non-urgent questions, you may also contact your provider using MyChart. We now offer e-Visits for anyone 59 and older to request care online for non-urgent symptoms. For details visit [email or web address].   Also download the MyChart app! Go to the app store, search "MyChart", open the app, select [facility], and log in with your MyChart username and password.  Bortezomib Injection What is this medication? BORTEZOMIB (bor TEZ oh mib) treats lymphoma. It may also be used to treat multiple myeloma, a type of bone marrow cancer. It works by blocking a protein that causes cancer cells to grow and multiply. This helps to slow or stop the spread of cancer cells. This medicine may be used for other purposes; ask your health care provider or pharmacist if you have questions. COMMON BRAND NAME(S): Velcade What should I tell my care team before I take this medication? They need to know if you have any of these conditions: Dehydration Diabetes Heart disease Liver disease Tingling of the fingers  or toes or other nerve disorder An unusual or allergic reaction to bortezomib, other medications, foods, dyes, or preservatives If you or your partner are pregnant or trying to get pregnant Breastfeeding How should I use this medication? This medication is injected into a vein or under the skin. It  is given by your care team in a hospital or clinic setting. Talk to your care team about the use of this medication in children. Special care may be needed. Overdosage: If you think you have taken too much of this medicine contact a poison control center or emergency room at once. NOTE: This medicine is only for you. Do not share this medicine with others. What if I miss a dose? Keep appointments for follow-up doses. It is important not to miss your dose. Call your care team if you are unable to keep an appointment. What may interact with this medication? Ketoconazole Rifampin This list may not describe all possible interactions. Give your health care provider a list of all the medicines, herbs, non-prescription drugs, or dietary supplements you use. Also tell them if you smoke, drink alcohol, or use illegal drugs. Some items may interact with your medicine. What should I watch for while using this medication? Your condition will be monitored carefully while you are receiving this medication. You may need blood work while taking this medication. This medication may affect your coordination, reaction time, or judgment. Do not drive or operate machinery until you know how this medication affects you. Sit up or stand slowly to reduce the risk of dizzy or fainting spells. Drinking alcohol with this medication can increase the risk of these side effects. This medication may increase your risk of getting an infection. Call your care team for advice if you get a fever, chills, sore throat, or other symptoms of a cold or flu. Do not treat yourself. Try to avoid being around people who are sick. Check with your care team if you have severe diarrhea, nausea, and vomiting, or if you sweat a lot. The loss of too much body fluid may make it dangerous for you to take this medication. Talk to your care team if you may be pregnant. Serious birth defects can occur if you take this medication during pregnancy and for 7  months after the last dose. You will need a negative pregnancy test before starting this medication. Contraception is recommended while taking this medication and for 7 months after the last dose. Your care team can help you find the option that works for you. If your partner can get pregnant, use a condom during sex while taking this medication and for 4 months after the last dose. Do not breastfeed while taking this medication and for 2 months after the last dose. This medication may cause infertility. Talk to your care team if you are concerned about your fertility. What side effects may I notice from receiving this medication? Side effects that you should report to your care team as soon as possible: Allergic reactions--skin rash, itching, hives, swelling of the face, lips, tongue, or throat Bleeding--bloody or black, tar-like stools, vomiting blood or brown material that looks like coffee grounds, red or dark brown urine, small red or purple spots on skin, unusual bruising or bleeding Bleeding in the brain--severe headache, stiff neck, confusion, dizziness, change in vision, numbness or weakness of the face, arm, or leg, trouble speaking, trouble walking, vomiting Bowel blockage--stomach cramping, unable to have a bowel movement or pass gas, loss of appetite, vomiting Heart  failure--shortness of breath, swelling of the ankles, feet, or hands, sudden weight gain, unusual weakness or fatigue Infection--fever, chills, cough, sore throat, wounds that don't heal, pain or trouble when passing urine, general feeling of discomfort or being unwell Liver injury--right upper belly pain, loss of appetite, nausea, light-colored stool, dark yellow or brown urine, yellowing skin or eyes, unusual weakness or fatigue Low blood pressure--dizziness, feeling faint or lightheaded, blurry vision Lung injury--shortness of breath or trouble breathing, cough, spitting up blood, chest pain, fever Pain, tingling, or  numbness in the hands or feet Severe or prolonged diarrhea Stomach pain, bloody diarrhea, pale skin, unusual weakness or fatigue, decrease in the amount of urine, which may be signs of hemolytic uremic syndrome Sudden and severe headache, confusion, change in vision, seizures, which may be signs of posterior reversible encephalopathy syndrome (PRES) TTP--purple spots on the skin or inside the mouth, pale skin, yellowing skin or eyes, unusual weakness or fatigue, fever, fast or irregular heartbeat, confusion, change in vision, trouble speaking, trouble walking Tumor lysis syndrome (TLS)--nausea, vomiting, diarrhea, decrease in the amount of urine, dark urine, unusual weakness or fatigue, confusion, muscle pain or cramps, fast or irregular heartbeat, joint pain Side effects that usually do not require medical attention (report to your care team if they continue or are bothersome): Constipation Diarrhea Fatigue Loss of appetite Nausea This list may not describe all possible side effects. Call your doctor for medical advice about side effects. You may report side effects to FDA at 1-800-FDA-1088. Where should I keep my medication? This medication is given in a hospital or clinic. It will not be stored at home. NOTE: This sheet is a summary. It may not cover all possible information. If you have questions about this medicine, talk to your doctor, pharmacist, or health care provider.  2024 Elsevier/Gold Standard (2022-03-07 00:00:00)

## 2023-08-14 ENCOUNTER — Encounter: Payer: Self-pay | Admitting: Hematology and Oncology

## 2023-08-14 LAB — KAPPA/LAMBDA LIGHT CHAINS
Kappa free light chain: 3.2 mg/L — ABNORMAL LOW (ref 3.3–19.4)
Kappa, lambda light chain ratio: 0.03 — ABNORMAL LOW (ref 0.26–1.65)
Lambda free light chains: 104.5 mg/L — ABNORMAL HIGH (ref 5.7–26.3)

## 2023-08-16 ENCOUNTER — Telehealth: Payer: Self-pay | Admitting: *Deleted

## 2023-08-16 LAB — MULTIPLE MYELOMA PANEL, SERUM
Albumin SerPl Elph-Mcnc: 3.7 g/dL (ref 2.9–4.4)
Albumin/Glob SerPl: 2.1 — ABNORMAL HIGH (ref 0.7–1.7)
Alpha 1: 0.2 g/dL (ref 0.0–0.4)
Alpha2 Glob SerPl Elph-Mcnc: 0.6 g/dL (ref 0.4–1.0)
B-Globulin SerPl Elph-Mcnc: 0.8 g/dL (ref 0.7–1.3)
Gamma Glob SerPl Elph-Mcnc: 0.3 g/dL — ABNORMAL LOW (ref 0.4–1.8)
Globulin, Total: 1.8 g/dL — ABNORMAL LOW (ref 2.2–3.9)
IgA: <5 mg/dL — ABNORMAL LOW (ref 87–352)
IgG (Immunoglobin G), Serum: 308 mg/dL — ABNORMAL LOW (ref 586–1602)
IgM (Immunoglobulin M), Srm: <5 mg/dL — ABNORMAL LOW (ref 26–217)
Total Protein ELP: 5.5 g/dL — ABNORMAL LOW (ref 6.0–8.5)

## 2023-08-16 NOTE — Telephone Encounter (Signed)
-----   Message from Ulysees Barns IV sent at 08/16/2023  8:52 AM EDT ----- Please let Mrs. Earney know that there was a drop in her Lambda light chains from 900 down to 104. This is an excellent response to therapy. We will continue with the current treatment plan. ----- Message ----- From: Interface, Lab In [place] Sent: 08/13/2023   3:00 PM EDT To: Jaci Standard, MD

## 2023-08-16 NOTE — Telephone Encounter (Signed)
TCT patient regarding recent lab results. Spoke with her. Advised that there was a drop in her Lambda light chains from 900 down to 104. This is an excellent response to therapy. We will continue with the current treatment plan. Pt very pleased with these results. She is aware of her upcoming appts.

## 2023-08-20 ENCOUNTER — Inpatient Hospital Stay: Payer: Medicare HMO

## 2023-08-20 ENCOUNTER — Other Ambulatory Visit: Payer: Self-pay | Admitting: *Deleted

## 2023-08-20 ENCOUNTER — Other Ambulatory Visit: Payer: Self-pay | Admitting: Hematology and Oncology

## 2023-08-20 ENCOUNTER — Inpatient Hospital Stay: Payer: Medicare HMO | Attending: Hematology and Oncology

## 2023-08-20 VITALS — BP 131/63 | HR 79 | Temp 98.0°F | Resp 17 | Wt 138.5 lb

## 2023-08-20 DIAGNOSIS — Z79899 Other long term (current) drug therapy: Secondary | ICD-10-CM | POA: Diagnosis not present

## 2023-08-20 DIAGNOSIS — Z800 Family history of malignant neoplasm of digestive organs: Secondary | ICD-10-CM | POA: Diagnosis not present

## 2023-08-20 DIAGNOSIS — C9000 Multiple myeloma not having achieved remission: Secondary | ICD-10-CM | POA: Diagnosis present

## 2023-08-20 DIAGNOSIS — Z5112 Encounter for antineoplastic immunotherapy: Secondary | ICD-10-CM | POA: Insufficient documentation

## 2023-08-20 LAB — CBC WITH DIFFERENTIAL (CANCER CENTER ONLY)
Abs Immature Granulocytes: 0.03 10*3/uL (ref 0.00–0.07)
Basophils Absolute: 0 10*3/uL (ref 0.0–0.1)
Basophils Relative: 1 %
Eosinophils Absolute: 0 10*3/uL (ref 0.0–0.5)
Eosinophils Relative: 0 %
HCT: 37.2 % (ref 36.0–46.0)
Hemoglobin: 12.4 g/dL (ref 12.0–15.0)
Immature Granulocytes: 1 %
Lymphocytes Relative: 15 %
Lymphs Abs: 1 10*3/uL (ref 0.7–4.0)
MCH: 32.6 pg (ref 26.0–34.0)
MCHC: 33.3 g/dL (ref 30.0–36.0)
MCV: 97.9 fL (ref 80.0–100.0)
Monocytes Absolute: 0 10*3/uL — ABNORMAL LOW (ref 0.1–1.0)
Monocytes Relative: 1 %
Neutro Abs: 5.5 10*3/uL (ref 1.7–7.7)
Neutrophils Relative %: 82 %
Platelet Count: 196 10*3/uL (ref 150–400)
RBC: 3.8 MIL/uL — ABNORMAL LOW (ref 3.87–5.11)
RDW: 14.3 % (ref 11.5–15.5)
WBC Count: 6.6 10*3/uL (ref 4.0–10.5)
nRBC: 0 % (ref 0.0–0.2)

## 2023-08-20 LAB — CMP (CANCER CENTER ONLY)
ALT: 17 U/L (ref 0–44)
AST: 15 U/L (ref 15–41)
Albumin: 4.1 g/dL (ref 3.5–5.0)
Alkaline Phosphatase: 94 U/L (ref 38–126)
Anion gap: 5 (ref 5–15)
BUN: 19 mg/dL (ref 8–23)
CO2: 28 mmol/L (ref 22–32)
Calcium: 8.5 mg/dL — ABNORMAL LOW (ref 8.9–10.3)
Chloride: 107 mmol/L (ref 98–111)
Creatinine: 0.69 mg/dL (ref 0.44–1.00)
GFR, Estimated: >60 mL/min (ref >60)
Glucose, Bld: 184 mg/dL — ABNORMAL HIGH (ref 70–99)
Potassium: 3.7 mmol/L (ref 3.5–5.1)
Sodium: 140 mmol/L (ref 135–145)
Total Bilirubin: 1.2 mg/dL — ABNORMAL HIGH (ref <1.2)
Total Protein: 5.8 g/dL — ABNORMAL LOW (ref 6.5–8.1)

## 2023-08-20 MED ORDER — LENALIDOMIDE 10 MG PO CAPS: 10.0000 mg | ORAL_CAPSULE | Freq: Every day | ORAL | 0 refills | Status: DC

## 2023-08-20 MED ORDER — BORTEZOMIB CHEMO SQ INJECTION 3.5 MG (2.5MG/ML)
1.3000 mg/m2 | Freq: Once | INTRAMUSCULAR | Status: AC
  Administered 2023-08-20: 2 mg via SUBCUTANEOUS
  Filled 2023-08-20: qty 0.8

## 2023-08-20 NOTE — Patient Instructions (Signed)
[facility] - A DEPT OF [facility]  Discharge Instructions: Thank you for choosing [facility] to provide your oncology and hematology care.   If you have a lab appointment with the Cancer Center, please go directly to the Cancer Center and check in at the registration area.   Wear comfortable clothing and clothing appropriate for easy access to any Portacath or PICC line.   We strive to give you quality time with your provider. You may need to reschedule your appointment if you arrive late (15 or more minutes).  Arriving late affects you and other patients whose appointments are after yours.  Also, if you miss three or more appointments without notifying the office, you may be dismissed from the clinic at the provider's discretion.      For prescription refill requests, have your pharmacy contact our office and allow 72 hours for refills to be completed.    Today you received the following chemotherapy and/or immunotherapy agents: Velcade      To help prevent nausea and vomiting after your treatment, we encourage you to take your nausea medication as directed.  BELOW ARE SYMPTOMS THAT SHOULD BE REPORTED IMMEDIATELY: *FEVER GREATER THAN 100.4 F (38 C) OR HIGHER *CHILLS OR SWEATING *NAUSEA AND VOMITING THAT IS NOT CONTROLLED WITH YOUR NAUSEA MEDICATION *UNUSUAL SHORTNESS OF BREATH *UNUSUAL BRUISING OR BLEEDING *URINARY PROBLEMS (pain or burning when urinating, or frequent urination) *BOWEL PROBLEMS (unusual diarrhea, constipation, pain near the anus) TENDERNESS IN MOUTH AND THROAT WITH OR WITHOUT PRESENCE OF ULCERS (sore throat, sores in mouth, or a toothache) UNUSUAL RASH, SWELLING OR PAIN  UNUSUAL VAGINAL DISCHARGE OR ITCHING   Items with * indicate a potential emergency and should be followed up as soon as possible or go to the Emergency Department if any problems should occur.  Please show the CHEMOTHERAPY ALERT CARD or IMMUNOTHERAPY  ALERT CARD at check-in to the Emergency Department and triage nurse.  Should you have questions after your visit or need to cancel or reschedule your appointment, please contact [facility] - A DEPT OF [facility]  Dept: [phone]  and follow the prompts.  Office hours are 8:00 a.m. to 4:30 p.m. Monday - Friday. Please note that voicemails left after 4:00 p.m. may not be returned until the following business day.  We are closed weekends and major holidays. You have access to a nurse at all times for urgent questions. Please call the main number to the clinic Dept: [phone] and follow the prompts.   For any non-urgent questions, you may also contact your provider using MyChart. We now offer e-Visits for anyone 72 and older to request care online for non-urgent symptoms. For details visit [email or web address].   Also download the MyChart app! Go to the app store, search "MyChart", open the app, select [place], and log in with your MyChart username and password.

## 2023-08-20 NOTE — Progress Notes (Signed)
Pt states she took her dexamethasone at home today.

## 2023-08-21 ENCOUNTER — Encounter: Payer: Self-pay | Admitting: Hematology and Oncology

## 2023-08-24 ENCOUNTER — Telehealth: Payer: Self-pay | Admitting: *Deleted

## 2023-08-24 NOTE — Telephone Encounter (Signed)
Received vm message regarding some reactions to her treatment.. TCT patient and spoke with her. She states that her most recent Velcade injection site has been a bit itchy for the last few days. Advised to put cool, not cold or ice on the site and a bit of hydrocortisone around the site. To help with the discomfort.  She also states that the rash around her ankles and behind her knees is still there but no worse. Advised that Dr. Leonides Schanz will assess when she comes in next week. Advised that she can still take the benadryl @ night and claritin during the day for the itchiness.   Pt voiced understanding.

## 2023-08-28 ENCOUNTER — Inpatient Hospital Stay: Payer: Medicare HMO

## 2023-08-28 ENCOUNTER — Inpatient Hospital Stay: Payer: Medicare HMO | Admitting: Hematology and Oncology

## 2023-08-28 ENCOUNTER — Encounter: Payer: Self-pay | Admitting: Hematology and Oncology

## 2023-08-28 VITALS — BP 132/69 | HR 71 | Temp 98.5°F | Resp 15 | Wt 138.2 lb

## 2023-08-28 DIAGNOSIS — D696 Thrombocytopenia, unspecified: Secondary | ICD-10-CM | POA: Diagnosis not present

## 2023-08-28 DIAGNOSIS — Z5112 Encounter for antineoplastic immunotherapy: Secondary | ICD-10-CM | POA: Diagnosis not present

## 2023-08-28 DIAGNOSIS — D539 Nutritional anemia, unspecified: Secondary | ICD-10-CM

## 2023-08-28 DIAGNOSIS — C9000 Multiple myeloma not having achieved remission: Secondary | ICD-10-CM

## 2023-08-28 LAB — CMP (CANCER CENTER ONLY)
ALT: 19 U/L (ref 0–44)
AST: 20 U/L (ref 15–41)
Albumin: 4 g/dL (ref 3.5–5.0)
Alkaline Phosphatase: 96 U/L (ref 38–126)
Anion gap: 4 — ABNORMAL LOW (ref 5–15)
BUN: 14 mg/dL (ref 8–23)
CO2: 33 mmol/L — ABNORMAL HIGH (ref 22–32)
Calcium: 8.7 mg/dL — ABNORMAL LOW (ref 8.9–10.3)
Chloride: 107 mmol/L (ref 98–111)
Creatinine: 0.76 mg/dL (ref 0.44–1.00)
GFR, Estimated: >60 mL/min (ref >60)
Glucose, Bld: 83 mg/dL (ref 70–99)
Potassium: 3.5 mmol/L (ref 3.5–5.1)
Sodium: 144 mmol/L (ref 135–145)
Total Bilirubin: 1.5 mg/dL — ABNORMAL HIGH (ref <1.2)
Total Protein: 5.7 g/dL — ABNORMAL LOW (ref 6.5–8.1)

## 2023-08-28 LAB — CBC WITH DIFFERENTIAL (CANCER CENTER ONLY)
Abs Immature Granulocytes: 0.02 10*3/uL (ref 0.00–0.07)
Basophils Absolute: 0.1 10*3/uL (ref 0.0–0.1)
Basophils Relative: 1 %
Eosinophils Absolute: 0.2 10*3/uL (ref 0.0–0.5)
Eosinophils Relative: 4 %
HCT: 39.1 % (ref 36.0–46.0)
Hemoglobin: 13.2 g/dL (ref 12.0–15.0)
Immature Granulocytes: 0 %
Lymphocytes Relative: 34 %
Lymphs Abs: 1.6 10*3/uL (ref 0.7–4.0)
MCH: 32.7 pg (ref 26.0–34.0)
MCHC: 33.8 g/dL (ref 30.0–36.0)
MCV: 96.8 fL (ref 80.0–100.0)
Monocytes Absolute: 0.6 10*3/uL (ref 0.1–1.0)
Monocytes Relative: 12 %
Neutro Abs: 2.2 10*3/uL (ref 1.7–7.7)
Neutrophils Relative %: 49 %
Platelet Count: 167 10*3/uL (ref 150–400)
RBC: 4.04 MIL/uL (ref 3.87–5.11)
RDW: 14.1 % (ref 11.5–15.5)
WBC Count: 4.6 10*3/uL (ref 4.0–10.5)
nRBC: 0 % (ref 0.0–0.2)

## 2023-08-28 NOTE — Progress Notes (Signed)
[facility] [phone]   [phone]  PROGRESS NOTE  Patient Care Team: Rodrigo Ran, MD as PCP - General (Internal Medicine)  Hematological/Oncological History # Free Lambda Multiple Myeloma 11/10/2022: WBC 5.68, Hgb 11.7, MCV 97.4, Plt 146 04/17/2023: WBC 4.53, Hgb 11.5, MCV 107.6, Plt 110 05/16/2023: establish care with Dr. Leonides Schanz. Labs showed M protein 0.1 with Lambda 900, Kappa 3 with ratio 0.00.   06/01/2023: bone marrow biopsy performed, showed plasma cell myeloma nearly effacing approximately 80% of the cellular marrow.  06/25/2023: Cycle 1 Day 1 of VRD chemotherapy 07/23/2023: Cycle 2 Day 1 of VRD chemotherapy 08/13/2023: Cycle 3 Day 1 of VRD chemotherapy  Interval History:  Brooke Whitaker 72 y.o. female with medical history significant for newly diagnosed multiple myeloma who presents for a follow up visit. The patient's last visit was on 08/13/2023. In the interim since the last visit she has had no major changes in her health.  On exam today Brooke Whitaker reports that the injection site for her last Velcade shot is still pretty bright red.  She reports normally does faded but this 1 is still quite active.  She also has a rash on her lower extremities, coming up to about the level of her knee.  She reports that there is no itching or pain.  She also continues to have issues with her eyes with swelling of her eyelids bilaterally.  She reports that she is seeing her ophthalmologist tomorrow.  She reports that she has been using hot compresses but no other treatments.  She notes that she is tolerating the Revlimid 10 mg p.o. daily better and is willing and able to proceed with that dosage.  She is not having any nausea, vomiting, or diarrhea.  She denies any numbness or tingling of the fingers or toes.  She denies any fevers, chills, sweats, nausea, vomiting, or diarrhea.  A full 10 point ROS is otherwise negative.  Today we discussed transitioning her to  Darzalex because the Velcade appears to be causing her ocular toxicity.  She is willing to make the transition understands the risks and benefits of this therapy.  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    Sigmoid diverticulosis    Wears glasses     SURGICAL HISTORY: Past Surgical History:  Procedure Laterality Date   BREAST BIOPSY Right 09/2011   benign    COLONOSCOPY  2010   CYSTOSCOPY/RETROGRADE/URETEROSCOPY/STONE EXTRACTION WITH BASKET Left 04/21/2014   Procedure: cystoscopy, left retrograde pylegram, LEFT URETEROSCOPY, laser lithotripsy with holmium laser, STONE EXTRACTION, uretheral calibration;  Surgeon: Anner Crete, MD;  Location: [facility];  Service: Urology;  Laterality: Left;   DILATION AND EVACUATION  1995   EPISIOTOMY REPAIR, SEVERE TEAR  1989   LAPAROSCOPIC OVARIAN CYSTECTOMY  10/ 2000   NM MYOCAR PERF WALL MOTION  12/15/2009   Normal   US ECHOCARDIOGRAPHY  12/15/2009   Normal study for age: mild MR,TR and trace PI    SOCIAL HISTORY: Social History   Socioeconomic History   Marital status: Married    Spouse name: Not on file   Number of children: 2   Years of education: Not on file   Highest education level: Not on file  Occupational History  Occupation: Print production planner  Tobacco Use   Smoking status: Never    Passive exposure: Never   Smokeless tobacco: Never  Vaping Use   Vaping status: Never Used  Substance and Sexual Activity   Alcohol use: Yes    Alcohol/week: 2.0 standard drinks of alcohol    Types: 2 Glasses of wine per week   Drug use: No   Sexual activity: Not Currently    Partners: Male  Other Topics Concern   Not on file  Social History Narrative   Not on file   Social Determinants of Health   Financial Resource Strain: Not on file  Food  Insecurity: No Food Insecurity (05/16/2023)   Hunger Vital Sign    Worried About Running Out of Food in the Last Year: Never true    Ran Out of Food in the Last Year: Never true  Transportation Needs: No Transportation Needs (05/16/2023)   PRAPARE - Administrator, Civil Service (Medical): No    Lack of Transportation (Non-Medical): No  Physical Activity: Not on file  Stress: Not on file  Social Connections: Not on file  Intimate Partner Violence: Not At Risk (05/16/2023)   Humiliation, Afraid, Rape, and Kick questionnaire    Fear of Current or Ex-Partner: No    Emotionally Abused: No    Physically Abused: No    Sexually Abused: No    FAMILY HISTORY: Family History  Problem Relation Age of Onset   Diabetes Mother    Congestive Heart Failure Mother    Stroke Mother    Osteoporosis Mother    Other Mother        gallbladder ruptured   Heart attack Father    Diabetes Sister    Heart disease Sister    Heart disease Sister        x2   Hypertension Sister    Thyroid disease Sister    Hypertension Sister    Diabetes Brother    Dementia Brother    Colon cancer Brother        mets to liver   Heart disease Brother        pacemaker   Stroke Brother    Liver cancer Brother    Heart disease Brother    Stomach cancer Neg Hx    Pancreatic cancer Neg Hx    Esophageal cancer Neg Hx     ALLERGIES:  is allergic to penicillins and sulfa antibiotics.  MEDICATIONS:  Current Outpatient Medications  Medication Sig Dispense Refill   acetaminophen (TYLENOL) 500 MG tablet Take 500 mg by mouth every 6 (six) hours as needed for mild pain or headache.      acyclovir (ZOVIRAX) 400 MG tablet Take 1 tablet (400 mg total) by mouth 2 (two) times daily. 180 tablet 1   allopurinol (ZYLOPRIM) 300 MG tablet Take 1 tablet (300 mg total) by mouth daily. 90 tablet 0   aspirin EC 81 MG tablet Take 81 mg by mouth daily. Swallow whole.     cetirizine (ZYRTEC) 5 MG tablet Take 5 mg by mouth daily  as needed for allergies.     cholecalciferol (VITAMIN D) 1000 UNITS tablet Take 1,000 Units by mouth every morning.      Cyanocobalamin (VITAMIN B 12 PO) Take 1,000 mcg by mouth daily.     dexamethasone (DECADRON) 4 MG tablet Take 10 tablets (40 mg total) by mouth once a week. Take once weekly on the day you receive the chemotherapy injection. 40 tablet 5  ELDERBERRY PO Take 1 tablet by mouth daily.     fluticasone (FLONASE) 50 MCG/ACT nasal spray Place into the nose as needed.     lenalidomide (REVLIMID) 10 MG capsule Take 1 capsule (10 mg total) by mouth daily. [employer] Auth # [id number]   Date Obtained 08/20/23 Take 1 capsule by mouth daily for 14 days then none for 7 days 14 capsule 0   ondansetron (ZOFRAN) 8 MG tablet Take 1 tablet (8 mg total) by mouth every 8 (eight) hours as needed. 30 tablet 0   prochlorperazine (COMPAZINE) 10 MG tablet Take 1 tablet (10 mg total) by mouth every 6 (six) hours as needed for nausea or vomiting. 30 tablet 0   rosuvastatin (CRESTOR) 5 MG tablet Take 5 mg by mouth daily.     valACYclovir (VALTREX) 500 MG tablet TAKE 4 TABLETS BY MOUTH EVERY 12 HOURS X 1 DAY FOR FEVER BLISTER AS NEEDED. 30 tablet 2   No current facility-administered medications for this visit.    REVIEW OF SYSTEMS:   Constitutional: ( - ) fevers, ( - )  chills , ( - ) night sweats Eyes: ( - ) blurriness of vision, ( - ) double vision, ( - ) watery eyes Ears, nose, mouth, throat, and face: ( - ) mucositis, ( - ) sore throat Respiratory: ( - ) cough, ( - ) dyspnea, ( - ) wheezes Cardiovascular: ( - ) palpitation, ( - ) chest discomfort, ( - ) lower extremity swelling Gastrointestinal:  ( - ) nausea, ( - ) heartburn, ( - ) change in bowel habits Skin: ( - ) abnormal skin rashes Lymphatics: ( - ) new lymphadenopathy, ( - ) easy bruising Neurological: ( - ) numbness, ( - ) tingling, ( - ) new weaknesses Behavioral/Psych: ( - ) mood change, ( - ) new changes  All other systems were reviewed with  the patient and are negative.  PHYSICAL EXAMINATION: ECOG PERFORMANCE STATUS: 0 - Asymptomatic  Vitals:   08/28/23 0950  BP: 132/69  Pulse: 71  Resp: 15  Temp: 98.5 F (36.9 C)  SpO2: 99%      Filed Weights   08/28/23 0950  Weight: 138 lb 3.2 oz (62.7 kg)       GENERAL: Well-appearing elderly Caucasian female, alert, no distress and comfortable SKIN: skin color, texture, turgor are normal, no rashes or significant lesions EYES: conjunctiva are pink and non-injected, sclera clear LUNGS: clear to auscultation and percussion with normal breathing effort HEART: regular rate & rhythm and no murmurs and no lower extremity edema Musculoskeletal: no cyanosis of digits and no clubbing  PSYCH: alert & oriented x 3, fluent speech NEURO: no focal motor/sensory deficits  LABORATORY DATA:  I have reviewed the data as listed    Latest Ref Rng & Units 08/28/2023    9:04 AM 08/20/2023    2:36 PM 08/13/2023    2:50 PM  CBC  WBC 4.0 - 10.5 K/uL 4.6  6.6  6.4   Hemoglobin 12.0 - 15.0 g/dL 62.1  30.8  65.7   Hematocrit 36.0 - 46.0 % 39.1  37.2  39.6   Platelets 150 - 400 K/uL 167  196  177        Latest Ref Rng & Units 08/28/2023    9:04 AM 08/20/2023    2:36 PM 08/13/2023    2:50 PM  CMP  Glucose 70 - 99 mg/dL 83  846  962   BUN 8 - 23 mg/dL 14  19  20   Creatinine 0.44 - 1.00 mg/dL 8.65  7.84  6.96   Sodium 135 - 145 mmol/L 144  140  139   Potassium 3.5 - 5.1 mmol/L 3.5  3.7  3.7   Chloride 98 - 111 mmol/L 107  107  104   CO2 22 - 32 mmol/L 33  28  29   Calcium 8.9 - 10.3 mg/dL 8.7  8.5  9.2   Total Protein 6.5 - 8.1 g/dL 5.7  5.8  6.3   Total Bilirubin <1.2 mg/dL 1.5  1.2  1.4   Alkaline Phos 38 - 126 U/L 96  94  102   AST 15 - 41 U/L 20  15  18    ALT 0 - 44 U/L 19  17  19      Lab Results  Component Value Date   MPROTEIN Not Observed 08/13/2023   MPROTEIN 0.1 (H) 05/16/2023   Lab Results  Component Value Date   KPAFRELGTCHN 3.2 (L) 08/13/2023   KPAFRELGTCHN  3.0 (L) 05/16/2023   LAMBDASER 104.5 (H) 08/13/2023   LAMBDASER 900.1 (H) 05/16/2023   KAPLAMBRATIO 0.03 (L) 08/13/2023   KAPLAMBRATIO 0.00 (L) 06/15/2023   KAPLAMBRATIO 0.00 (L) 05/16/2023    RADIOGRAPHIC STUDIES: No results found.  ASSESSMENT & PLAN Brooke Whitaker 72 y.o. female with medical history significant for newly diagnosed multiple myeloma who presents for a follow up visit.   Previously we discussed the diagnosis of multiple myeloma.  We discussed that this is a blood cancer that is not curable.  The goal is to get the patient into a durable remission with treatment.  We discussed the need for a minimum of 6 months of chemotherapy treatment then followed by maintenance therapy if the M protein has reached 0 and the free light chains are within normal limits.  We discussed expected side effects of the regimen and the plan moving forward.  The patient voiced her understanding of our findings, expected side effects, and risks/benefits.  She is okay with proceeding with chemotherapy at this time.  # Free Lambda Multiple Myeloma -- Diagnosis confirmed by the presence of a macrocytic anemia and 80% plasma cells in the bone marrow --Initially started with VRD chemotherapy with the patient developed rash as result of the Revlimid and ocular toxicity from the Velcade.  Will plan to drop the Revlimid down to 10 mg p.o. daily and transition to Darzalex for her next treatment. --UPEP showed marked Bence Jones proteins in the urine, no evidence of lytic lesions on metastatic survey --Today is cycle 4 day 15 of VRD chemotherapy.  Recommend stopping Velcade and starting Darzalex at next visit. --Labs today show white blood cell 4.6, Hgb 13.2, MCV 96.8, Plt 167 -- Plan for return to clinic for cycle 1 day 1 of Darzalex/rev/Dex next week  #Supportive Care -- chemotherapy education complete -- port placement not required  -- ASA 81 mg PO daily to be taken with revlimid.  -- zofran 8mg  q8H PRN  and compazine 10mg  PO q6H for nausea -- acyclovir 400mg  PO BID for VCZ prophylaxis -- allopurinol 300mg  PO daily for TLS prophylaxis -- EMLA cream for port --Will discuss the need for Zometa treatment after symptoms of her regimen have stabilized. -- no pain medication required at this time.    Orders Placed This Encounter  Procedures   CMP (Cancer Center only)    Standing Status:   Future    Standing Expiration Date:   09/03/2024   CBC with Differential (  Cancer Center Only)    Standing Status:   Future    Standing Expiration Date:   09/03/2024   CMP (Cancer Center only)    Standing Status:   Future    Standing Expiration Date:   09/10/2024   CBC with Differential (Cancer Center Only)    Standing Status:   Future    Standing Expiration Date:   09/10/2024   Kappa/lambda light chains    Standing Status:   Future    Standing Expiration Date:   09/17/2024   Multiple Myeloma Panel (SPEP&IFE w/QIG)    Standing Status:   Future    Standing Expiration Date:   09/17/2024   CMP (Cancer Center only)    Standing Status:   Future    Standing Expiration Date:   09/17/2024   CBC with Differential (Cancer Center Only)    Standing Status:   Future    Standing Expiration Date:   09/17/2024   CMP (Cancer Center only)    Standing Status:   Future    Standing Expiration Date:   09/24/2024   CBC with Differential (Cancer Center Only)    Standing Status:   Future    Standing Expiration Date:   09/24/2024   Kappa/lambda light chains    Standing Status:   Future    Standing Expiration Date:   10/01/2024   Multiple Myeloma Panel (SPEP&IFE w/QIG)    Standing Status:   Future    Standing Expiration Date:   10/01/2024   CMP (Cancer Center only)    Standing Status:   Future    Standing Expiration Date:   10/01/2024   CBC with Differential (Cancer Center Only)    Standing Status:   Future    Standing Expiration Date:   10/01/2024   CMP (Cancer Center only)    Standing Status:   Future    Standing  Expiration Date:   10/08/2024   CBC with Differential (Cancer Center Only)    Standing Status:   Future    Standing Expiration Date:   10/08/2024   CMP (Cancer Center only)    Standing Status:   Future    Standing Expiration Date:   10/15/2024   CBC with Differential (Cancer Center Only)    Standing Status:   Future    Standing Expiration Date:   10/15/2024   CMP (Cancer Center only)    Standing Status:   Future    Standing Expiration Date:   10/22/2024   CBC with Differential (Cancer Center Only)    Standing Status:   Future    Standing Expiration Date:   10/22/2024   Kappa/lambda light chains    Standing Status:   Future    Standing Expiration Date:   10/29/2024   Multiple Myeloma Panel (SPEP&IFE w/QIG)    Standing Status:   Future    Standing Expiration Date:   10/29/2024   CMP (Cancer Center only)    Standing Status:   Future    Standing Expiration Date:   10/29/2024   CBC with Differential (Cancer Center Only)    Standing Status:   Future    Standing Expiration Date:   10/29/2024   CMP (Cancer Center only)    Standing Status:   Future    Standing Expiration Date:   11/12/2024   CBC with Differential (Cancer Center Only)    Standing Status:   Future    Standing Expiration Date:   11/12/2024    All questions were answered. The patient  knows to call the clinic with any problems, questions or concerns.  A total of more than 30 minutes were spent on this encounter with face-to-face time and non-face-to-face time, including preparing to see the patient, ordering tests and/or medications, counseling the patient and coordination of care as outlined above.   Ulysees Barns, MD Department of Hematology/Oncology [facility] Phone: [phone] Pager: [phone] Email: [email or web address]  08/28/2023 4:01 PM

## 2023-08-28 NOTE — Progress Notes (Signed)
DISCONTINUE ON PATHWAY REGIMEN - Multiple Myeloma and Other Plasma Cell Dyscrasias     A cycle is every 21 days:     Bortezomib      Lenalidomide      Dexamethasone   **Always confirm dose/schedule in your pharmacy ordering system**  REASON: Toxicities / Adverse Event PRIOR TREATMENT: [id number]: VRd (Bortezomib 1.3 mg/m2 SUBQ D1, 8, 15 + Lenalidomide 25 mg + Dexamethasone 40 mg) q21 Days x 4-6 Cycles Maximum Prior to Stem Cell Harvest Concurrent with Referral to Transplant Service TREATMENT RESPONSE: Partial Response (PR)  START OFF PATHWAY REGIMEN - Multiple Myeloma and Other Plasma Cell Dyscrasias   [id number]:DaraRd (Daratumumab SUBQ + Lenalidomide PO + Dexamethasone PO/IV) q28 Days:   Cycles 1 and 2: A cycle is every 28 days:     Lenalidomide      Dexamethasone      Daratumumab and hyaluronidase-fihj    Cycles 3 through 6: A cycle is every 28 days:     Lenalidomide      Dexamethasone      Dexamethasone      Daratumumab and hyaluronidase-fihj    Cycles 7 and beyond: A cycle is every 28 days:     Lenalidomide      Dexamethasone      Dexamethasone      Daratumumab and hyaluronidase-fihj   **Always confirm dose/schedule in your pharmacy ordering system**  Patient Characteristics: Multiple Myeloma, Newly Diagnosed, Transplant Eligible, High Risk Disease Classification: Multiple Myeloma Therapeutic Status: Newly Diagnosed R2-ISS Staging: II Is Patient Eligible for Transplant<= Transplant Eligible Risk Status: High Risk Intent of Therapy: Non-Curative / Palliative Intent, Discussed with Patient

## 2023-08-29 ENCOUNTER — Encounter: Payer: Self-pay | Admitting: Cardiovascular Disease

## 2023-08-29 ENCOUNTER — Ambulatory Visit: Payer: Medicare HMO | Attending: Cardiovascular Disease | Admitting: Cardiovascular Disease

## 2023-08-29 VITALS — BP 112/69 | HR 70 | Ht 61.0 in | Wt 139.2 lb

## 2023-08-29 DIAGNOSIS — Z8249 Family history of ischemic heart disease and other diseases of the circulatory system: Secondary | ICD-10-CM | POA: Diagnosis not present

## 2023-08-29 DIAGNOSIS — E782 Mixed hyperlipidemia: Secondary | ICD-10-CM

## 2023-08-29 LAB — KAPPA/LAMBDA LIGHT CHAINS
Kappa free light chain: 6.1 mg/L (ref 3.3–19.4)
Kappa, lambda light chain ratio: 0.06 — ABNORMAL LOW (ref 0.26–1.65)
Lambda free light chains: 106.2 mg/L — ABNORMAL HIGH (ref 5.7–26.3)

## 2023-08-29 NOTE — Progress Notes (Signed)
08/29/2023 Brooke Whitaker   10/23/1950  [id number]  Primary Physician Rodrigo Ran, MD Primary Cardiologist: Runell Gess MD Nicholes Calamity, [place]  HPI:  Brooke Whitaker is a 72 y.o.  mildly overweight, married Caucasian female, mother of 2, patient of Dr. Jeananne Rama who I saw in the office 07/15/2021.[name]  Her risk factors include dyslipidemia and a strong family history of heart disease. She had a father who had his first MI at age 28 and died of an MI at age 54. She had a sister who died suddenly of an MI at age 58.  She had an echo and a Myoview performed March 2011 which were entirely normal.    Since I saw her almost 2 years ago she continues to do well.  She did have a coronary calcium score performed by Dr. Waynard Edwards 12/25/2020 which was 0.  She gets occasional palpitations in her throat but this is fairly the infrequent.  She otherwise denies chest pain or shortness of breath.  Her most recent lipid profile performed 12/26/2021 revealed a total cholesterol 197, LDL 96 and HDL 86.  Unfortunately, she recently was diagnosed with multiple myeloma and is undergoing therapy for this.   Current Meds  Medication Sig   acetaminophen (TYLENOL) 500 MG tablet Take 500 mg by mouth every 6 (six) hours as needed for mild pain or headache.    acyclovir (ZOVIRAX) 400 MG tablet Take 1 tablet (400 mg total) by mouth 2 (two) times daily.   allopurinol (ZYLOPRIM) 300 MG tablet Take 1 tablet (300 mg total) by mouth daily.   aspirin EC 81 MG tablet Take 81 mg by mouth daily. Swallow whole.   cetirizine (ZYRTEC) 5 MG tablet Take 5 mg by mouth daily as needed for allergies.   cholecalciferol (VITAMIN D) 1000 UNITS tablet Take 1,000 Units by mouth every morning.    Cyanocobalamin (VITAMIN B 12 PO) Take 1,000 mcg by mouth daily.   Daratumumab-Hyaluronidase-fihj (DARZALEX FASPRO [place]) Inject into the skin once a week. Every Monday   dexamethasone (DECADRON) 4 MG tablet Take 10 tablets (40 mg total) by mouth  once a week. Take once weekly on the day you receive the chemotherapy injection.   ELDERBERRY PO Take 1 tablet by mouth daily.   fluticasone (FLONASE) 50 MCG/ACT nasal spray Place into the nose as needed.   lenalidomide (REVLIMID) 10 MG capsule Take 1 capsule (10 mg total) by mouth daily. [employer] Auth # [id number]   Date Obtained 08/20/23 Take 1 capsule by mouth daily for 14 days then none for 7 days   ondansetron (ZOFRAN) 8 MG tablet Take 1 tablet (8 mg total) by mouth every 8 (eight) hours as needed.   prochlorperazine (COMPAZINE) 10 MG tablet Take 1 tablet (10 mg total) by mouth every 6 (six) hours as needed for nausea or vomiting.   rosuvastatin (CRESTOR) 5 MG tablet Take 5 mg by mouth daily.   valACYclovir (VALTREX) 500 MG tablet TAKE 4 TABLETS BY MOUTH EVERY 12 HOURS X 1 DAY FOR FEVER BLISTER AS NEEDED.     Allergies  Allergen Reactions   Penicillins Other (See Comments)    UNKNOWN Childhood REACTION   Sulfa Antibiotics Other (See Comments)    UNKNOWN Childhood REACTION    Social History   Socioeconomic History   Marital status: Married    Spouse name: Not on file   Number of children: 2   Years of education: Not on file   Highest education  level: Not on file  Occupational History   Occupation: Print production planner  Tobacco Use   Smoking status: Never    Passive exposure: Never   Smokeless tobacco: Never  Vaping Use   Vaping status: Never Used  Substance and Sexual Activity   Alcohol use: Yes    Alcohol/week: 2.0 standard drinks of alcohol    Types: 2 Glasses of wine per week   Drug use: No   Sexual activity: Not Currently    Partners: Male  Other Topics Concern   Not on file  Social History Narrative   Not on file   Social Determinants of Health   Financial Resource Strain: Not on file  Food Insecurity: No Food Insecurity (05/16/2023)   Hunger Vital Sign    Worried About Running Out of Food in the Last Year: Never true    Ran Out of Food in the Last Year: Never true   Transportation Needs: No Transportation Needs (05/16/2023)   PRAPARE - Administrator, Civil Service (Medical): No    Lack of Transportation (Non-Medical): No  Physical Activity: Not on file  Stress: Not on file  Social Connections: Not on file  Intimate Partner Violence: Not At Risk (05/16/2023)   Humiliation, Afraid, Rape, and Kick questionnaire    Fear of Current or Ex-Partner: No    Emotionally Abused: No    Physically Abused: No    Sexually Abused: No     Review of Systems: General: negative for chills, fever, night sweats or weight changes.  Cardiovascular: negative for chest pain, dyspnea on exertion, edema, orthopnea, palpitations, paroxysmal nocturnal dyspnea or shortness of breath Dermatological: negative for rash Respiratory: negative for cough or wheezing Urologic: negative for hematuria Abdominal: negative for nausea, vomiting, diarrhea, bright red blood per rectum, melena, or hematemesis Neurologic: negative for visual changes, syncope, or dizziness All other systems reviewed and are otherwise negative except as noted above.    Blood pressure 112/69, pulse 70, height 5\' 1"  (1.549 m), weight 139 lb 3.2 oz (63.1 kg), last menstrual period 10/16/2001, SpO2 97%.  General appearance: alert and no distress Neck: no adenopathy, no carotid bruit, no JVD, supple, symmetrical, trachea midline, and thyroid not enlarged, symmetric, no tenderness/mass/nodules Lungs: clear to auscultation bilaterally Heart: regular rate and rhythm, S1, S2 normal, no murmur, click, rub or gallop Extremities: extremities normal, atraumatic, no cyanosis or edema Pulses: 2+ and symmetric Skin: Skin color, texture, turgor normal. No rashes or lesions Neurologic: Grossly normal  EKG EKG Interpretation Date/Time:  Wednesday August 29 2023 15:55:08 EST Ventricular Rate:  70 PR Interval:  128 QRS Duration:  92 QT Interval:  424 QTC Calculation: 457 R Axis:   16  Text  Interpretation: Normal sinus rhythm Normal ECG When compared with ECG of 28-Aug-2020 13:27, PREVIOUS ECG IS PRESENT Confirmed by Nanetta Batty [phone]) on 08/29/2023 4:07:17 PM    ASSESSMENT AND PLAN:   Hyperlipidemia History of hyperlipidemia on rosuvastatin with lipid profile performed 12/26/2021 revealing a total cholesterol 197, LDL of 96 and HDL of 86.     Runell Gess MD FACP,FACC,FAHA, [facility] 08/29/2023 4:12 PM

## 2023-08-29 NOTE — Assessment & Plan Note (Signed)
History of hyperlipidemia on rosuvastatin with lipid profile performed 12/26/2021 revealing a total cholesterol 197, LDL of 96 and HDL of 86.

## 2023-08-29 NOTE — Patient Instructions (Signed)

## 2023-08-31 ENCOUNTER — Encounter: Payer: Self-pay | Admitting: Hematology and Oncology

## 2023-08-31 DIAGNOSIS — H0100A Unspecified blepharitis right eye, upper and lower eyelids: Secondary | ICD-10-CM | POA: Diagnosis not present

## 2023-08-31 DIAGNOSIS — H0100B Unspecified blepharitis left eye, upper and lower eyelids: Secondary | ICD-10-CM | POA: Diagnosis not present

## 2023-08-31 DIAGNOSIS — H0012 Chalazion right lower eyelid: Secondary | ICD-10-CM | POA: Diagnosis not present

## 2023-09-02 LAB — MULTIPLE MYELOMA PANEL, SERUM
Albumin SerPl Elph-Mcnc: 3.5 g/dL (ref 2.9–4.4)
Albumin/Glob SerPl: 1.9 — ABNORMAL HIGH (ref 0.7–1.7)
Alpha 1: 0.2 g/dL (ref 0.0–0.4)
Alpha2 Glob SerPl Elph-Mcnc: 0.6 g/dL (ref 0.4–1.0)
B-Globulin SerPl Elph-Mcnc: 0.8 g/dL (ref 0.7–1.3)
Gamma Glob SerPl Elph-Mcnc: 0.2 g/dL — ABNORMAL LOW (ref 0.4–1.8)
Globulin, Total: 1.9 g/dL — ABNORMAL LOW (ref 2.2–3.9)
IgA: <5 mg/dL — ABNORMAL LOW (ref 87–352)
IgG (Immunoglobin G), Serum: 250 mg/dL — ABNORMAL LOW (ref 586–1602)
IgM (Immunoglobulin M), Srm: 5 mg/dL — ABNORMAL LOW (ref 26–217)
Total Protein ELP: 5.4 g/dL — ABNORMAL LOW (ref 6.0–8.5)

## 2023-09-03 ENCOUNTER — Ambulatory Visit: Payer: Medicare HMO

## 2023-09-03 ENCOUNTER — Inpatient Hospital Stay: Payer: Medicare HMO

## 2023-09-03 ENCOUNTER — Other Ambulatory Visit: Payer: Medicare HMO

## 2023-09-03 ENCOUNTER — Other Ambulatory Visit: Payer: Self-pay

## 2023-09-03 ENCOUNTER — Other Ambulatory Visit: Payer: Self-pay | Admitting: Hematology and Oncology

## 2023-09-03 VITALS — BP 131/69 | HR 89 | Temp 98.1°F | Resp 18

## 2023-09-03 DIAGNOSIS — Z5112 Encounter for antineoplastic immunotherapy: Secondary | ICD-10-CM | POA: Diagnosis not present

## 2023-09-03 DIAGNOSIS — C9000 Multiple myeloma not having achieved remission: Secondary | ICD-10-CM

## 2023-09-03 LAB — CMP (CANCER CENTER ONLY)
ALT: 19 U/L (ref 0–44)
AST: 18 U/L (ref 15–41)
Albumin: 4 g/dL (ref 3.5–5.0)
Alkaline Phosphatase: 89 U/L (ref 38–126)
Anion gap: 5 (ref 5–15)
BUN: 19 mg/dL (ref 8–23)
CO2: 31 mmol/L (ref 22–32)
Calcium: 8.8 mg/dL — ABNORMAL LOW (ref 8.9–10.3)
Chloride: 108 mmol/L (ref 98–111)
Creatinine: 0.72 mg/dL (ref 0.44–1.00)
GFR, Estimated: >60 mL/min (ref >60)
Glucose, Bld: 103 mg/dL — ABNORMAL HIGH (ref 70–99)
Potassium: 3.4 mmol/L — ABNORMAL LOW (ref 3.5–5.1)
Sodium: 144 mmol/L (ref 135–145)
Total Bilirubin: 1.1 mg/dL (ref <1.2)
Total Protein: 5.7 g/dL — ABNORMAL LOW (ref 6.5–8.1)

## 2023-09-03 LAB — CBC WITH DIFFERENTIAL (CANCER CENTER ONLY)
Abs Immature Granulocytes: 0.01 10*3/uL (ref 0.00–0.07)
Basophils Absolute: 0.1 10*3/uL (ref 0.0–0.1)
Basophils Relative: 2 %
Eosinophils Absolute: 0.2 10*3/uL (ref 0.0–0.5)
Eosinophils Relative: 4 %
HCT: 39.7 % (ref 36.0–46.0)
Hemoglobin: 13.4 g/dL (ref 12.0–15.0)
Immature Granulocytes: 0 %
Lymphocytes Relative: 36 %
Lymphs Abs: 2.1 10*3/uL (ref 0.7–4.0)
MCH: 32.2 pg (ref 26.0–34.0)
MCHC: 33.8 g/dL (ref 30.0–36.0)
MCV: 95.4 fL (ref 80.0–100.0)
Monocytes Absolute: 0.4 10*3/uL (ref 0.1–1.0)
Monocytes Relative: 7 %
Neutro Abs: 2.9 10*3/uL (ref 1.7–7.7)
Neutrophils Relative %: 51 %
Platelet Count: 192 10*3/uL (ref 150–400)
RBC: 4.16 MIL/uL (ref 3.87–5.11)
RDW: 14.1 % (ref 11.5–15.5)
WBC Count: 5.7 10*3/uL (ref 4.0–10.5)
nRBC: 0 % (ref 0.0–0.2)

## 2023-09-03 LAB — TYPE AND SCREEN
ABO/RH(D): O POS
Antibody Screen: NEGATIVE

## 2023-09-03 MED ORDER — DEXAMETHASONE 4 MG PO TABS: 40.0000 mg | ORAL_TABLET | Freq: Once | ORAL | Status: DC

## 2023-09-03 MED ORDER — MONTELUKAST SODIUM 10 MG PO TABS
10.0000 mg | ORAL_TABLET | Freq: Once | ORAL | Status: AC
  Administered 2023-09-03: 10 mg via ORAL
  Filled 2023-09-03: qty 1

## 2023-09-03 MED ORDER — DIPHENHYDRAMINE HCL 25 MG PO CAPS
50.0000 mg | ORAL_CAPSULE | Freq: Once | ORAL | Status: AC
  Administered 2023-09-03: 50 mg via ORAL
  Filled 2023-09-03: qty 2

## 2023-09-03 MED ORDER — DARATUMUMAB-HYALURONIDASE-FIHJ 1800-30000 MG-UT/15ML ~~LOC~~ SOLN
1800.0000 mg | Freq: Once | SUBCUTANEOUS | Status: AC
  Administered 2023-09-03: 1800 mg via SUBCUTANEOUS
  Filled 2023-09-03: qty 15

## 2023-09-03 MED ORDER — ACETAMINOPHEN 325 MG PO TABS
650.0000 mg | ORAL_TABLET | Freq: Once | ORAL | Status: AC
  Administered 2023-09-03: 650 mg via ORAL
  Filled 2023-09-03: qty 2

## 2023-09-03 NOTE — Patient Instructions (Signed)
[facility] - A DEPT OF [facility]  Discharge Instructions: Thank you for choosing [facility] to provide your oncology and hematology care.   If you have a lab appointment with the Cancer Center, please go directly to the Cancer Center and check in at the registration area.   Wear comfortable clothing and clothing appropriate for easy access to any Portacath or PICC line.   We strive to give you quality time with your provider. You may need to reschedule your appointment if you arrive late (15 or more minutes).  Arriving late affects you and other patients whose appointments are after yours.  Also, if you miss three or more appointments without notifying the office, you may be dismissed from the clinic at the provider's discretion.      For prescription refill requests, have your pharmacy contact our office and allow 72 hours for refills to be completed.    Today you received the following chemotherapy and/or immunotherapy agents: Dar Faspro.       To help prevent nausea and vomiting after your treatment, we encourage you to take your nausea medication as directed.  BELOW ARE SYMPTOMS THAT SHOULD BE REPORTED IMMEDIATELY: *FEVER GREATER THAN 100.4 F (38 C) OR HIGHER *CHILLS OR SWEATING *NAUSEA AND VOMITING THAT IS NOT CONTROLLED WITH YOUR NAUSEA MEDICATION *UNUSUAL SHORTNESS OF BREATH *UNUSUAL BRUISING OR BLEEDING *URINARY PROBLEMS (pain or burning when urinating, or frequent urination) *BOWEL PROBLEMS (unusual diarrhea, constipation, pain near the anus) TENDERNESS IN MOUTH AND THROAT WITH OR WITHOUT PRESENCE OF ULCERS (sore throat, sores in mouth, or a toothache) UNUSUAL RASH, SWELLING OR PAIN  UNUSUAL VAGINAL DISCHARGE OR ITCHING   Items with * indicate a potential emergency and should be followed up as soon as possible or go to the Emergency Department if any problems should occur.  Please show the CHEMOTHERAPY ALERT CARD or  IMMUNOTHERAPY ALERT CARD at check-in to the Emergency Department and triage nurse.  Should you have questions after your visit or need to cancel or reschedule your appointment, please contact [facility] - A DEPT OF [facility]  Dept: [phone]  and follow the prompts.  Office hours are 8:00 a.m. to 4:30 p.m. Monday - Friday. Please note that voicemails left after 4:00 p.m. may not be returned until the following business day.  We are closed weekends and major holidays. You have access to a nurse at all times for urgent questions. Please call the main number to the clinic Dept: [phone] and follow the prompts.   For any non-urgent questions, you may also contact your provider using MyChart. We now offer e-Visits for anyone 32 and older to request care online for non-urgent symptoms. For details visit [email or web address].   Also download the MyChart app! Go to the app store, search "MyChart", open the app, select [place], and log in with your MyChart username and password.

## 2023-09-03 NOTE — Progress Notes (Signed)
PT observed for 2H post injection without issue. VSS at time of discharge. Injection site clear, dry and intact.

## 2023-09-04 ENCOUNTER — Telehealth: Payer: Self-pay

## 2023-09-04 LAB — PRETREATMENT RBC PHENOTYPE: DAT, IgG: NEGATIVE

## 2023-09-04 NOTE — Telephone Encounter (Signed)
Brooke Whitaker states that she is doing fine. She is eating, drinking, and urinating well. She knows to call the office at [phone] if  she has any questions or concerns.

## 2023-09-04 NOTE — Telephone Encounter (Signed)
-----   Message from Nurse Threasa Beards sent at 09/03/2023  1:03 PM EST ----- Regarding: Dr Leonides Schanz pt, first time Brooke Whitaker Dr Leonides Schanz pt came in 11/18 for first time [name]. Observed for 2 hours. Tolerated injection well. Needs call back.

## 2023-09-07 ENCOUNTER — Other Ambulatory Visit: Payer: Self-pay | Admitting: Hematology and Oncology

## 2023-09-09 NOTE — Progress Notes (Unsigned)
[facility] [phone]   [phone]  PROGRESS NOTE  Patient Care Team: Rodrigo Ran, MD as PCP - General (Internal Medicine)  Hematological/Oncological History # Free Lambda Multiple Myeloma 11/10/2022: WBC 5.68, Hgb 11.7, MCV 97.4, Plt 146 04/17/2023: WBC 4.53, Hgb 11.5, MCV 107.6, Plt 110 05/16/2023: establish care with Dr. Leonides Schanz. Labs showed M protein 0.1 with Lambda 900, Kappa 3 with ratio 0.00.   06/01/2023: bone marrow biopsy performed, showed plasma cell myeloma nearly effacing approximately 80% of the cellular marrow.  06/25/2023: Cycle 1 Day 1 of VRD chemotherapy 07/23/2023: Cycle 2 Day 1 of VRD chemotherapy 08/13/2023: Cycle 3 Day 1 of VRD chemotherapy 09/03/2023: Cycle 1 Day 1 of Dara/Rev/Dex  Interval History:  Brooke Whitaker 72 y.o. female with medical history significant for newly diagnosed multiple myeloma who presents for a follow up visit. The patient's last visit was on 08/28/2023. In the interim since the last visit she has had no major changes in her health.  On exam today Brooke Whitaker reports she started the Darzalex therapy last week and tolerated it well.  She did sleep most of the day but that is was likely due to the Benadryl she received.  She reports her eyes are little bit better.  She reports that she was given doxycycline therapy but has not yet started it because she is concerned will cause upset stomach and "messed up her system".  She notes that if her eye symptoms do not start improving she will start taking the doxycycline therapy.  She reports that she has been using hot compresses and baby shampoo to wash her eyes.  She reports that she is no longer constipated and thinks the Velcade may have been the cause.  She reports her bowels are moving regularly daily.  She also notes that her rash is gone.  She reports that she has a little bit of nauseous "yuck" feeling but ginger ale seems to smooth it over.  She is not having any  vomiting or diarrhea.  She reports she feels like her sense of smell is heightened.  Her taste is better a little bit but "comes and goes".  She is not currently having any urinary issues.  Overall she is willing and able to continue with Darzalex therapy at this time.  She denies any fevers, chills, sweats, nausea, vomiting, or diarrhea.  A full 10 point ROS is otherwise negative.  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    Sigmoid diverticulosis    Wears glasses     SURGICAL HISTORY: Past Surgical History:  Procedure Laterality Date   BREAST BIOPSY Right 09/2011   benign    COLONOSCOPY  2010   CYSTOSCOPY/RETROGRADE/URETEROSCOPY/STONE EXTRACTION WITH BASKET Left 04/21/2014   Procedure: cystoscopy, left retrograde pylegram, LEFT URETEROSCOPY, laser lithotripsy with holmium laser, STONE EXTRACTION, uretheral calibration;  Surgeon: Anner Crete, MD;  Location: [facility];  Service: Urology;  Laterality: Left;   DILATION AND EVACUATION  1995   EPISIOTOMY REPAIR, SEVERE TEAR  1989   LAPAROSCOPIC OVARIAN CYSTECTOMY  10/ 2000   NM MYOCAR PERF WALL MOTION  12/15/2009   Normal   US ECHOCARDIOGRAPHY  12/15/2009   Normal study for age: mild MR,TR and trace PI  SOCIAL HISTORY: Social History   Socioeconomic History   Marital status: Married    Spouse name: Not on file   Number of children: 2   Years of education: Not on file   Highest education level: Not on file  Occupational History   Occupation: Print production planner  Tobacco Use   Smoking status: Never    Passive exposure: Never   Smokeless tobacco: Never  Vaping Use   Vaping status: Never Used  Substance and Sexual Activity   Alcohol use: Yes    Alcohol/week: 2.0 standard drinks of alcohol    Types: 2 Glasses of wine per week   Drug use:  No   Sexual activity: Not Currently    Partners: Male  Other Topics Concern   Not on file  Social History Narrative   Not on file   Social Determinants of Health   Financial Resource Strain: Not on file  Food Insecurity: No Food Insecurity (05/16/2023)   Hunger Vital Sign    Worried About Running Out of Food in the Last Year: Never true    Ran Out of Food in the Last Year: Never true  Transportation Needs: No Transportation Needs (05/16/2023)   PRAPARE - Administrator, Civil Service (Medical): No    Lack of Transportation (Non-Medical): No  Physical Activity: Not on file  Stress: Not on file  Social Connections: Not on file  Intimate Partner Violence: Not At Risk (05/16/2023)   Humiliation, Afraid, Rape, and Kick questionnaire    Fear of Current or Ex-Partner: No    Emotionally Abused: No    Physically Abused: No    Sexually Abused: No    FAMILY HISTORY: Family History  Problem Relation Age of Onset   Diabetes Mother    Congestive Heart Failure Mother    Stroke Mother    Osteoporosis Mother    Other Mother        gallbladder ruptured   Heart attack Father    Diabetes Sister    Heart disease Sister    Heart disease Sister        x2   Hypertension Sister    Thyroid disease Sister    Hypertension Sister    Diabetes Brother    Dementia Brother    Colon cancer Brother        mets to liver   Heart disease Brother        pacemaker   Stroke Brother    Liver cancer Brother    Heart disease Brother    Stomach cancer Neg Hx    Pancreatic cancer Neg Hx    Esophageal cancer Neg Hx     ALLERGIES:  is allergic to penicillins and sulfa antibiotics.  MEDICATIONS:  Current Outpatient Medications  Medication Sig Dispense Refill   acetaminophen (TYLENOL) 500 MG tablet Take 500 mg by mouth every 6 (six) hours as needed for mild pain or headache.      acyclovir (ZOVIRAX) 400 MG tablet Take 1 tablet (400 mg total) by mouth 2 (two) times daily. 180 tablet 1    allopurinol (ZYLOPRIM) 300 MG tablet TAKE 1 TABLET BY MOUTH EVERY DAY 90 tablet 0   aspirin EC 81 MG tablet Take 81 mg by mouth daily. Swallow whole.     cetirizine (ZYRTEC) 5 MG tablet Take 5 mg by mouth daily as needed for allergies.     cholecalciferol (VITAMIN D) 1000 UNITS tablet Take 1,000 Units by mouth every morning.  Cyanocobalamin (VITAMIN B 12 PO) Take 1,000 mcg by mouth daily.     Daratumumab-Hyaluronidase-fihj (DARZALEX FASPRO [place]) Inject into the skin once a week. Every Monday     dexamethasone (DECADRON) 4 MG tablet Take 10 tablets (40 mg total) by mouth once a week. Take once weekly on the day you receive the chemotherapy injection. 40 tablet 5   ELDERBERRY PO Take 1 tablet by mouth daily.     fluticasone (FLONASE) 50 MCG/ACT nasal spray Place into the nose as needed.     lenalidomide (REVLIMID) 10 MG capsule Take 1 capsule (10 mg total) by mouth daily. [employer] Auth # [id number]   Date Obtained 08/20/23 Take 1 capsule by mouth daily for 14 days then none for 7 days 14 capsule 0   ondansetron (ZOFRAN) 8 MG tablet Take 1 tablet (8 mg total) by mouth every 8 (eight) hours as needed. 30 tablet 0   prochlorperazine (COMPAZINE) 10 MG tablet Take 1 tablet (10 mg total) by mouth every 6 (six) hours as needed for nausea or vomiting. 30 tablet 0   rosuvastatin (CRESTOR) 5 MG tablet Take 5 mg by mouth daily.     valACYclovir (VALTREX) 500 MG tablet TAKE 4 TABLETS BY MOUTH EVERY 12 HOURS X 1 DAY FOR FEVER BLISTER AS NEEDED. 30 tablet 2   No current facility-administered medications for this visit.    REVIEW OF SYSTEMS:   Constitutional: ( - ) fevers, ( - )  chills , ( - ) night sweats Eyes: ( - ) blurriness of vision, ( - ) double vision, ( - ) watery eyes Ears, nose, mouth, throat, and face: ( - ) mucositis, ( - ) sore throat Respiratory: ( - ) cough, ( - ) dyspnea, ( - ) wheezes Cardiovascular: ( - ) palpitation, ( - ) chest discomfort, ( - ) lower extremity swelling Gastrointestinal:   ( - ) nausea, ( - ) heartburn, ( - ) change in bowel habits Skin: ( - ) abnormal skin rashes Lymphatics: ( - ) new lymphadenopathy, ( - ) easy bruising Neurological: ( - ) numbness, ( - ) tingling, ( - ) new weaknesses Behavioral/Psych: ( - ) mood change, ( - ) new changes  All other systems were reviewed with the patient and are negative.  PHYSICAL EXAMINATION: ECOG PERFORMANCE STATUS: 0 - Asymptomatic  There were no vitals filed for this visit.     There were no vitals filed for this visit.      GENERAL: Well-appearing elderly Caucasian female, alert, no distress and comfortable SKIN: skin color, texture, turgor are normal, no rashes or significant lesions EYES: conjunctiva are pink and non-injected, sclera clear LUNGS: clear to auscultation and percussion with normal breathing effort HEART: regular rate & rhythm and no murmurs and no lower extremity edema Musculoskeletal: no cyanosis of digits and no clubbing  PSYCH: alert & oriented x 3, fluent speech NEURO: no focal motor/sensory deficits  LABORATORY DATA:  I have reviewed the data as listed    Latest Ref Rng & Units 09/03/2023    9:10 AM 08/28/2023    9:04 AM 08/20/2023    2:36 PM  CBC  WBC 4.0 - 10.5 K/uL 5.7  4.6  6.6   Hemoglobin 12.0 - 15.0 g/dL 38.7  56.4  33.2   Hematocrit 36.0 - 46.0 % 39.7  39.1  37.2   Platelets 150 - 400 K/uL 192  167  196        Latest Ref Rng & Units  09/03/2023    9:10 AM 08/28/2023    9:04 AM 08/20/2023    2:36 PM  CMP  Glucose 70 - 99 mg/dL 604  83  540   BUN 8 - 23 mg/dL 19  14  19    Creatinine 0.44 - 1.00 mg/dL 9.81  1.91  4.78   Sodium 135 - 145 mmol/L 144  144  140   Potassium 3.5 - 5.1 mmol/L 3.4  3.5  3.7   Chloride 98 - 111 mmol/L 108  107  107   CO2 22 - 32 mmol/L 31  33  28   Calcium 8.9 - 10.3 mg/dL 8.8  8.7  8.5   Total Protein 6.5 - 8.1 g/dL 5.7  5.7  5.8   Total Bilirubin <1.2 mg/dL 1.1  1.5  1.2   Alkaline Phos 38 - 126 U/L 89  96  94   AST 15 - 41 U/L 18   20  15    ALT 0 - 44 U/L 19  19  17      Lab Results  Component Value Date   MPROTEIN Not Observed 08/28/2023   MPROTEIN Not Observed 08/13/2023   MPROTEIN 0.1 (H) 05/16/2023   Lab Results  Component Value Date   KPAFRELGTCHN 6.1 08/28/2023   KPAFRELGTCHN 3.2 (L) 08/13/2023   KPAFRELGTCHN 3.0 (L) 05/16/2023   LAMBDASER 106.2 (H) 08/28/2023   LAMBDASER 104.5 (H) 08/13/2023   LAMBDASER 900.1 (H) 05/16/2023   KAPLAMBRATIO 0.06 (L) 08/28/2023   KAPLAMBRATIO 0.03 (L) 08/13/2023   KAPLAMBRATIO 0.00 (L) 06/15/2023    RADIOGRAPHIC STUDIES: No results found.  ASSESSMENT & PLAN Brooke Whitaker 72 y.o. female with medical history significant for newly diagnosed multiple myeloma who presents for a follow up visit.   Previously we discussed the diagnosis of multiple myeloma.  We discussed that this is a blood cancer that is not curable.  The goal is to get the patient into a durable remission with treatment.  We discussed the need for a minimum of 6 months of chemotherapy treatment then followed by maintenance therapy if the M protein has reached 0 and the free light chains are within normal limits.  We discussed expected side effects of the regimen and the plan moving forward.  The patient voiced her understanding of our findings, expected side effects, and risks/benefits.  She is okay with proceeding with chemotherapy at this time.  # Free Lambda Multiple Myeloma -- Diagnosis confirmed by the presence of a macrocytic anemia and 80% plasma cells in the bone marrow --Initially started with VRD chemotherapy with the patient developed rash as result of the Revlimid and ocular toxicity from the Velcade.  Dropped the Revlimid down to 10 mg p.o. daily and transition to Darzalex for her next treatment. --UPEP showed marked Bence Jones proteins in the urine, no evidence of lytic lesions on metastatic survey PLAN: --Today is Cycle 1 Day 8 of Dara/Rev/Dex --Labs today show white blood cell 6.3, Hgb 13.1,  MCV 97.4, Plt 163 -- Plan for return to clinic for Cycle 1 Day 22 of Darzalex/rev/Dex   #Supportive Care -- chemotherapy education complete -- port placement not required  -- ASA 81 mg PO daily to be taken with revlimid.  -- zofran 8mg  q8H PRN and compazine 10mg  PO q6H for nausea -- acyclovir 400mg  PO BID for VCZ prophylaxis -- allopurinol 300mg  PO daily for TLS prophylaxis -- EMLA cream for port --Will discuss the need for Zometa treatment after symptoms of her regimen have stabilized. --  no pain medication required at this time.    No orders of the defined types were placed in this encounter.   All questions were answered. The patient knows to call the clinic with any problems, questions or concerns.  A total of more than 30 minutes were spent on this encounter with face-to-face time and non-face-to-face time, including preparing to see the patient, ordering tests and/or medications, counseling the patient and coordination of care as outlined above.   Ulysees Barns, MD Department of Hematology/Oncology [facility] Phone: [phone] Pager: [phone] Email: [email or web address]  09/09/2023 5:40 PM

## 2023-09-10 ENCOUNTER — Ambulatory Visit: Payer: Medicare HMO | Admitting: Hematology and Oncology

## 2023-09-10 ENCOUNTER — Inpatient Hospital Stay: Payer: Medicare HMO

## 2023-09-10 ENCOUNTER — Ambulatory Visit: Payer: Medicare HMO

## 2023-09-10 ENCOUNTER — Inpatient Hospital Stay (HOSPITAL_BASED_OUTPATIENT_CLINIC_OR_DEPARTMENT_OTHER): Payer: Medicare HMO | Admitting: Hematology and Oncology

## 2023-09-10 ENCOUNTER — Other Ambulatory Visit: Payer: Medicare HMO

## 2023-09-10 VITALS — BP 134/57 | HR 76 | Resp 18

## 2023-09-10 VITALS — BP 133/67 | HR 68 | Temp 97.1°F | Resp 13 | Wt 138.0 lb

## 2023-09-10 DIAGNOSIS — D696 Thrombocytopenia, unspecified: Secondary | ICD-10-CM

## 2023-09-10 DIAGNOSIS — C9000 Multiple myeloma not having achieved remission: Secondary | ICD-10-CM | POA: Diagnosis not present

## 2023-09-10 DIAGNOSIS — D539 Nutritional anemia, unspecified: Secondary | ICD-10-CM

## 2023-09-10 DIAGNOSIS — Z5112 Encounter for antineoplastic immunotherapy: Secondary | ICD-10-CM | POA: Diagnosis not present

## 2023-09-10 LAB — CMP (CANCER CENTER ONLY)
ALT: 19 U/L (ref 0–44)
AST: 18 U/L (ref 15–41)
Albumin: 4 g/dL (ref 3.5–5.0)
Alkaline Phosphatase: 91 U/L (ref 38–126)
Anion gap: 4 — ABNORMAL LOW (ref 5–15)
BUN: 18 mg/dL (ref 8–23)
CO2: 31 mmol/L (ref 22–32)
Calcium: 8.9 mg/dL (ref 8.9–10.3)
Chloride: 106 mmol/L (ref 98–111)
Creatinine: 0.65 mg/dL (ref 0.44–1.00)
GFR, Estimated: >60 mL/min (ref >60)
Glucose, Bld: 74 mg/dL (ref 70–99)
Potassium: 3.8 mmol/L (ref 3.5–5.1)
Sodium: 141 mmol/L (ref 135–145)
Total Bilirubin: 1 mg/dL (ref <1.2)
Total Protein: 5.8 g/dL — ABNORMAL LOW (ref 6.5–8.1)

## 2023-09-10 LAB — CBC WITH DIFFERENTIAL (CANCER CENTER ONLY)
Abs Immature Granulocytes: 0.01 10*3/uL (ref 0.00–0.07)
Basophils Absolute: 0 10*3/uL (ref 0.0–0.1)
Basophils Relative: 1 %
Eosinophils Absolute: 0.2 10*3/uL (ref 0.0–0.5)
Eosinophils Relative: 4 %
HCT: 40.7 % (ref 36.0–46.0)
Hemoglobin: 13.1 g/dL (ref 12.0–15.0)
Immature Granulocytes: 0 %
Lymphocytes Relative: 22 %
Lymphs Abs: 1.4 10*3/uL (ref 0.7–4.0)
MCH: 31.3 pg (ref 26.0–34.0)
MCHC: 32.2 g/dL (ref 30.0–36.0)
MCV: 97.4 fL (ref 80.0–100.0)
Monocytes Absolute: 0.2 10*3/uL (ref 0.1–1.0)
Monocytes Relative: 3 %
Neutro Abs: 4.5 10*3/uL (ref 1.7–7.7)
Neutrophils Relative %: 70 %
Platelet Count: 163 10*3/uL (ref 150–400)
RBC: 4.18 MIL/uL (ref 3.87–5.11)
RDW: 14.2 % (ref 11.5–15.5)
WBC Count: 6.3 10*3/uL (ref 4.0–10.5)
nRBC: 0 % (ref 0.0–0.2)

## 2023-09-10 MED ORDER — DARATUMUMAB-HYALURONIDASE-FIHJ 1800-30000 MG-UT/15ML ~~LOC~~ SOLN
1800.0000 mg | Freq: Once | SUBCUTANEOUS | Status: AC
  Administered 2023-09-10: 1800 mg via SUBCUTANEOUS
  Filled 2023-09-10: qty 15

## 2023-09-10 MED ORDER — DIPHENHYDRAMINE HCL 25 MG PO CAPS
50.0000 mg | ORAL_CAPSULE | Freq: Once | ORAL | Status: AC
  Administered 2023-09-10: 50 mg via ORAL
  Filled 2023-09-10: qty 2

## 2023-09-10 MED ORDER — MONTELUKAST SODIUM 10 MG PO TABS
10.0000 mg | ORAL_TABLET | Freq: Once | ORAL | Status: AC
  Administered 2023-09-10: 10 mg via ORAL
  Filled 2023-09-10: qty 1

## 2023-09-10 MED ORDER — ACETAMINOPHEN 325 MG PO TABS
650.0000 mg | ORAL_TABLET | Freq: Once | ORAL | Status: AC
  Administered 2023-09-10: 650 mg via ORAL
  Filled 2023-09-10: qty 2

## 2023-09-10 NOTE — Patient Instructions (Signed)
[facility] - A DEPT OF [facility]  Discharge Instructions: Thank you for choosing [facility] to provide your oncology and hematology care.   If you have a lab appointment with the Cancer Center, please go directly to the Cancer Center and check in at the registration area.   Wear comfortable clothing and clothing appropriate for easy access to any Portacath or PICC line.   We strive to give you quality time with your provider. You may need to reschedule your appointment if you arrive late (15 or more minutes).  Arriving late affects you and other patients whose appointments are after yours.  Also, if you miss three or more appointments without notifying the office, you may be dismissed from the clinic at the provider's discretion.      For prescription refill requests, have your pharmacy contact our office and allow 72 hours for refills to be completed.    Today you received the following chemotherapy and/or immunotherapy agents: [name].       To help prevent nausea and vomiting after your treatment, we encourage you to take your nausea medication as directed.  BELOW ARE SYMPTOMS THAT SHOULD BE REPORTED IMMEDIATELY: *FEVER GREATER THAN 100.4 F (38 C) OR HIGHER *CHILLS OR SWEATING *NAUSEA AND VOMITING THAT IS NOT CONTROLLED WITH YOUR NAUSEA MEDICATION *UNUSUAL SHORTNESS OF BREATH *UNUSUAL BRUISING OR BLEEDING *URINARY PROBLEMS (pain or burning when urinating, or frequent urination) *BOWEL PROBLEMS (unusual diarrhea, constipation, pain near the anus) TENDERNESS IN MOUTH AND THROAT WITH OR WITHOUT PRESENCE OF ULCERS (sore throat, sores in mouth, or a toothache) UNUSUAL RASH, SWELLING OR PAIN  UNUSUAL VAGINAL DISCHARGE OR ITCHING   Items with * indicate a potential emergency and should be followed up as soon as possible or go to the Emergency Department if any problems should occur.  Please show the CHEMOTHERAPY ALERT CARD or  IMMUNOTHERAPY ALERT CARD at check-in to the Emergency Department and triage nurse.  Should you have questions after your visit or need to cancel or reschedule your appointment, please contact [facility] - A DEPT OF [facility]  Dept: [phone]  and follow the prompts.  Office hours are 8:00 a.m. to 4:30 p.m. Monday - Friday. Please note that voicemails left after 4:00 p.m. may not be returned until the following business day.  We are closed weekends and major holidays. You have access to a nurse at all times for urgent questions. Please call the main number to the clinic Dept: [phone] and follow the prompts.   For any non-urgent questions, you may also contact your provider using MyChart. We now offer e-Visits for anyone 69 and older to request care online for non-urgent symptoms. For details visit [email or web address].   Also download the MyChart app! Go to the app store, search "MyChart", open the app, select [place], and log in with your MyChart username and password.

## 2023-09-10 NOTE — Progress Notes (Signed)
Pt states she took decadron at home prior to arriving for injection.

## 2023-09-12 ENCOUNTER — Other Ambulatory Visit: Payer: Self-pay | Admitting: Hematology and Oncology

## 2023-09-12 ENCOUNTER — Other Ambulatory Visit: Payer: Self-pay | Admitting: *Deleted

## 2023-09-12 MED ORDER — LENALIDOMIDE 10 MG PO CAPS: 10.0000 mg | ORAL_CAPSULE | Freq: Every day | ORAL | 0 refills | Status: DC

## 2023-09-17 ENCOUNTER — Other Ambulatory Visit: Payer: Medicare HMO

## 2023-09-17 ENCOUNTER — Inpatient Hospital Stay: Payer: Medicare HMO | Attending: Hematology and Oncology

## 2023-09-17 ENCOUNTER — Ambulatory Visit: Payer: Medicare HMO

## 2023-09-17 ENCOUNTER — Inpatient Hospital Stay: Payer: Medicare HMO

## 2023-09-17 VITALS — BP 132/58 | HR 74 | Temp 98.0°F | Resp 16 | Wt 138.2 lb

## 2023-09-17 DIAGNOSIS — Z5112 Encounter for antineoplastic immunotherapy: Secondary | ICD-10-CM | POA: Insufficient documentation

## 2023-09-17 DIAGNOSIS — C9000 Multiple myeloma not having achieved remission: Secondary | ICD-10-CM | POA: Insufficient documentation

## 2023-09-17 DIAGNOSIS — Z79899 Other long term (current) drug therapy: Secondary | ICD-10-CM | POA: Insufficient documentation

## 2023-09-17 LAB — CBC WITH DIFFERENTIAL (CANCER CENTER ONLY)
Abs Immature Granulocytes: 0.02 10*3/uL (ref 0.00–0.07)
Basophils Absolute: 0 10*3/uL (ref 0.0–0.1)
Basophils Relative: 1 %
Eosinophils Absolute: 0 10*3/uL (ref 0.0–0.5)
Eosinophils Relative: 0 %
HCT: 40.7 % (ref 36.0–46.0)
Hemoglobin: 13.9 g/dL (ref 12.0–15.0)
Immature Granulocytes: 1 %
Lymphocytes Relative: 17 %
Lymphs Abs: 0.7 10*3/uL (ref 0.7–4.0)
MCH: 32.3 pg (ref 26.0–34.0)
MCHC: 34.2 g/dL (ref 30.0–36.0)
MCV: 94.7 fL (ref 80.0–100.0)
Monocytes Absolute: 0.1 10*3/uL (ref 0.1–1.0)
Monocytes Relative: 2 %
Neutro Abs: 3.1 10*3/uL (ref 1.7–7.7)
Neutrophils Relative %: 79 %
Platelet Count: 171 10*3/uL (ref 150–400)
RBC: 4.3 MIL/uL (ref 3.87–5.11)
RDW: 13.9 % (ref 11.5–15.5)
WBC Count: 3.9 10*3/uL — ABNORMAL LOW (ref 4.0–10.5)
nRBC: 0 % (ref 0.0–0.2)

## 2023-09-17 LAB — CMP (CANCER CENTER ONLY)
ALT: 21 U/L (ref 0–44)
AST: 18 U/L (ref 15–41)
Albumin: 4.2 g/dL (ref 3.5–5.0)
Alkaline Phosphatase: 95 U/L (ref 38–126)
Anion gap: 7 (ref 5–15)
BUN: 16 mg/dL (ref 8–23)
CO2: 26 mmol/L (ref 22–32)
Calcium: 8.9 mg/dL (ref 8.9–10.3)
Chloride: 106 mmol/L (ref 98–111)
Creatinine: 0.66 mg/dL (ref 0.44–1.00)
GFR, Estimated: >60 mL/min (ref >60)
Glucose, Bld: 139 mg/dL — ABNORMAL HIGH (ref 70–99)
Potassium: 3.7 mmol/L (ref 3.5–5.1)
Sodium: 139 mmol/L (ref 135–145)
Total Bilirubin: 1 mg/dL (ref <1.2)
Total Protein: 5.9 g/dL — ABNORMAL LOW (ref 6.5–8.1)

## 2023-09-17 MED ORDER — DARATUMUMAB-HYALURONIDASE-FIHJ 1800-30000 MG-UT/15ML ~~LOC~~ SOLN
1800.0000 mg | Freq: Once | SUBCUTANEOUS | Status: AC
  Administered 2023-09-17: 1800 mg via SUBCUTANEOUS
  Filled 2023-09-17: qty 15

## 2023-09-17 MED ORDER — MONTELUKAST SODIUM 10 MG PO TABS
10.0000 mg | ORAL_TABLET | Freq: Once | ORAL | Status: AC
  Administered 2023-09-17: 10 mg via ORAL
  Filled 2023-09-17: qty 1

## 2023-09-17 MED ORDER — ACETAMINOPHEN 325 MG PO TABS
650.0000 mg | ORAL_TABLET | Freq: Once | ORAL | Status: AC
  Administered 2023-09-17: 650 mg via ORAL
  Filled 2023-09-17: qty 2

## 2023-09-17 MED ORDER — DIPHENHYDRAMINE HCL 25 MG PO CAPS
50.0000 mg | ORAL_CAPSULE | Freq: Once | ORAL | Status: AC
Start: 2023-09-17 — End: 2023-09-17
  Administered 2023-09-17: 25 mg via ORAL
  Filled 2023-09-17: qty 2

## 2023-09-17 NOTE — Progress Notes (Signed)
Patient reports taking dexamethasone prior to treatment today.  Per Dr. Leonides Schanz - okay to reduce benadryl dose to 25 mg. If patient prefers, we can hold in the future.

## 2023-09-18 LAB — KAPPA/LAMBDA LIGHT CHAINS
Kappa free light chain: 3.6 mg/L (ref 3.3–19.4)
Kappa, lambda light chain ratio: 0.06 — ABNORMAL LOW (ref 0.26–1.65)
Lambda free light chains: 60.9 mg/L — ABNORMAL HIGH (ref 5.7–26.3)

## 2023-09-20 LAB — MULTIPLE MYELOMA PANEL, SERUM
Albumin SerPl Elph-Mcnc: 3.7 g/dL (ref 2.9–4.4)
Albumin/Glob SerPl: 2 — ABNORMAL HIGH (ref 0.7–1.7)
Alpha 1: 0.2 g/dL (ref 0.0–0.4)
Alpha2 Glob SerPl Elph-Mcnc: 0.6 g/dL (ref 0.4–1.0)
B-Globulin SerPl Elph-Mcnc: 0.8 g/dL (ref 0.7–1.3)
Gamma Glob SerPl Elph-Mcnc: 0.3 g/dL — ABNORMAL LOW (ref 0.4–1.8)
Globulin, Total: 1.9 g/dL — ABNORMAL LOW (ref 2.2–3.9)
IgA: <5 mg/dL — ABNORMAL LOW (ref 64–422)
IgG (Immunoglobin G), Serum: 304 mg/dL — ABNORMAL LOW (ref 586–1602)
IgM (Immunoglobulin M), Srm: <5 mg/dL — ABNORMAL LOW (ref 26–217)
M Protein SerPl Elph-Mcnc: 0.1 g/dL — ABNORMAL HIGH
Total Protein ELP: 5.6 g/dL — ABNORMAL LOW (ref 6.0–8.5)

## 2023-09-23 NOTE — Progress Notes (Unsigned)
Patient Care Team: Rodrigo Ran, MD as PCP - General (Internal Medicine)   CHIEF COMPLAINT: Follow up Multiple Myeloma   CURRENT THERAPY: Dara/Revlimid/Dex  INTERVAL HISTORY Brooke Whitaker returns for follow up and treatment as scheduled. Last seen by Dr Leonides Schanz 11/25 with C1D8 Dara/Rev/Dex, here today for D22 Dara.  She takes Dex at home and will start the next cycle of Revlimid tonight.  Did not experience a rash last time.  Overall doing well.  Maintaining good energy, eating and drinking.  Denies pain.  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    Sigmoid diverticulosis    Wears glasses      Past Surgical History:  Procedure Laterality Date   BREAST BIOPSY Right 09/2011   benign    COLONOSCOPY  2010   CYSTOSCOPY/RETROGRADE/URETEROSCOPY/STONE EXTRACTION WITH BASKET Left 04/21/2014   Procedure: cystoscopy, left retrograde pylegram, LEFT URETEROSCOPY, laser lithotripsy with holmium laser, STONE EXTRACTION, uretheral calibration;  Surgeon: Anner Crete, MD;  Location: [facility];  Service: Urology;  Laterality: Left;   DILATION AND EVACUATION  1995   EPISIOTOMY REPAIR, SEVERE TEAR  1989   LAPAROSCOPIC OVARIAN CYSTECTOMY  10/ 2000   NM MYOCAR PERF WALL MOTION  12/15/2009   Normal   US ECHOCARDIOGRAPHY  12/15/2009   Normal study for age: mild MR,TR and trace PI     Outpatient Encounter Medications as of 09/24/2023  Medication Sig   acetaminophen (TYLENOL) 500 MG tablet Take 500 mg by mouth every 6 (six) hours as needed for mild pain or headache.    acyclovir (ZOVIRAX) 400 MG tablet Take 1 tablet (400 mg total) by mouth 2 (two) times daily.   allopurinol (ZYLOPRIM) 300 MG tablet TAKE 1 TABLET BY MOUTH EVERY DAY   aspirin EC 81 MG tablet Take 81 mg by mouth  daily. Swallow whole.   cetirizine (ZYRTEC) 5 MG tablet Take 5 mg by mouth daily as needed for allergies.   cholecalciferol (VITAMIN D) 1000 UNITS tablet Take 1,000 Units by mouth every morning.    Cyanocobalamin (VITAMIN B 12 PO) Take 1,000 mcg by mouth daily.   Daratumumab-Hyaluronidase-fihj (DARZALEX FASPRO [place]) Inject into the skin once a week. Every Monday   dexamethasone (DECADRON) 4 MG tablet Take 10 tablets (40 mg total) by mouth once a week. Take once weekly on the day you receive the chemotherapy injection.   doxycycline (VIBRAMYCIN) 50 MG capsule Take 50 mg by mouth daily.   ELDERBERRY PO Take 1 tablet by mouth daily.   fluticasone (FLONASE) 50 MCG/ACT nasal spray Place into the nose as needed.   lenalidomide (REVLIMID) 10 MG capsule Take 1 capsule (10 mg total) by mouth daily. [employer] Auth # [id number]  Date Obtained 09/12/23 Take 1 capsule by mouth daily for 14 days then none for 7 days   ondansetron (ZOFRAN) 8 MG tablet Take 1 tablet (8 mg total) by mouth every 8 (eight) hours as needed.   prochlorperazine (COMPAZINE) 10 MG tablet Take 1 tablet (10 mg total) by mouth every 6 (six) hours as needed for nausea or vomiting.   rosuvastatin (CRESTOR) 5 MG tablet Take 5 mg by mouth daily.   valACYclovir (VALTREX) 500 MG tablet TAKE 4 TABLETS BY MOUTH  EVERY 12 HOURS X 1 DAY FOR FEVER BLISTER AS NEEDED.   No facility-administered encounter medications on file as of 09/24/2023.     Today's Vitals   09/24/23 1047 09/24/23 1048  BP: (!) 122/57   Pulse: 72   Resp: 14   Temp: 98.3 F (36.8 C)   TempSrc: Temporal   SpO2: 98%   Weight: 139 lb 1.6 oz (63.1 kg)   PainSc:  0-No pain   Body mass index is 26.28 kg/m.   PHYSICAL EXAM GENERAL:alert, no distress and comfortable SKIN: no rash  EYES: sclera clear NECK: without mass LYMPH:  no palpable cervical or supraclavicular lymphadenopathy  LUNGS: clear with normal breathing effort HEART: regular rate & rhythm, no lower extremity  edema ABDOMEN: abdomen soft, non-tender and normal bowel sounds NEURO: alert & oriented x 3 with fluent speech  CBC    Component Value Date/Time   WBC 4.0 09/24/2023 1019   WBC 4.0 06/01/2023 1010   RBC 4.33 09/24/2023 1019   HGB 13.3 09/24/2023 1019   HGB 12.3 01/13/2014 1656   HCT 41.4 09/24/2023 1019   PLT 144 (L) 09/24/2023 1019   MCV 95.6 09/24/2023 1019   MCH 30.7 09/24/2023 1019   MCHC 32.1 09/24/2023 1019   RDW 14.0 09/24/2023 1019   LYMPHSABS 1.5 09/24/2023 1019   MONOABS 0.5 09/24/2023 1019   EOSABS 0.1 09/24/2023 1019   BASOSABS 0.1 09/24/2023 1019     CMP     Component Value Date/Time   NA 142 09/24/2023 1019   K 4.4 09/24/2023 1019   CL 107 09/24/2023 1019   CO2 32 09/24/2023 1019   GLUCOSE 61 (L) 09/24/2023 1019   BUN 17 09/24/2023 1019   CREATININE 0.64 09/24/2023 1019   CREATININE 0.67 12/08/2014 0851   CALCIUM 8.8 (L) 09/24/2023 1019   PROT 5.7 (L) 09/24/2023 1019   ALBUMIN 3.9 09/24/2023 1019   AST 18 09/24/2023 1019   ALT 19 09/24/2023 1019   ALKPHOS 92 09/24/2023 1019   BILITOT 1.0 09/24/2023 1019   GFRNONAA >60 09/24/2023 1019   GFRAA 73 (L) 04/12/2014 1659     ASSESSMENT & PLAN: 72 year old female   Free Lambda Multiple Myeloma 11/10/2022: WBC 5.68, Hgb 11.7, MCV 97.4, Plt 146 04/17/2023: WBC 4.53, Hgb 11.5, MCV 107.6, Plt 110 05/16/2023: establish care with Dr. Leonides Schanz. Labs showed M protein 0.1 with Lambda 900, Kappa 3 with ratio 0.00.   06/01/2023: bone marrow biopsy performed, showed plasma cell myeloma nearly effacing approximately 80% of the cellular marrow.  -UPEP + Bence Jones proteins, bone met survey negative  06/25/2023: Cycle 1 Day 1 of VRD chemotherapy 07/23/2023: Cycle 2 Day 1 of VRD chemotherapy 08/13/2023: Cycle 3 Day 1 of VRD chemotherapy -Developed rash from Revlimid (improved on lower dose) and ocular toxicity from Velcade 09/03/2023: Cycle 1 Day 1 of Dara/Rev/Dex 09/10/2023 C1 Day 8 Dara/Rev/Dex 09/17/2023: Cycle 1 day 15  Dara/rev/Dex; lambda light chains improved 09/24/2023: Cycle 1 day 22 Dara/rev/Dex; no rash at the current dose of Revlimid    Disposition: Brooke Whitaker appears stable, tolerating treatment well with no significant toxicities.  Able to recover and function well with good performance status. There is no clinical evidence of disease progression. The recent lambda light chains have improved.  Labs are adequate to proceed with cycle 1 day 22 Dara today (took Dex at home).  She will begin the current Revlimid cycle tonight. Return weekly for lab and Dara, follow-up in 2 weeks.     All  questions were answered. The patient knows to call the clinic with any problems, questions or concerns. No barriers to learning were detected.   Santiago Glad, NP-C 09/24/2023

## 2023-09-24 ENCOUNTER — Inpatient Hospital Stay: Payer: Medicare HMO

## 2023-09-24 ENCOUNTER — Ambulatory Visit: Payer: Medicare HMO | Admitting: Nurse Practitioner

## 2023-09-24 ENCOUNTER — Encounter: Payer: Self-pay | Admitting: Nurse Practitioner

## 2023-09-24 ENCOUNTER — Ambulatory Visit: Payer: Medicare HMO

## 2023-09-24 ENCOUNTER — Other Ambulatory Visit: Payer: Medicare HMO

## 2023-09-24 ENCOUNTER — Inpatient Hospital Stay: Payer: Medicare HMO | Admitting: Nurse Practitioner

## 2023-09-24 VITALS — BP 122/57 | HR 72 | Temp 98.3°F | Resp 14 | Wt 139.1 lb

## 2023-09-24 DIAGNOSIS — C9000 Multiple myeloma not having achieved remission: Secondary | ICD-10-CM

## 2023-09-24 DIAGNOSIS — Z5112 Encounter for antineoplastic immunotherapy: Secondary | ICD-10-CM | POA: Diagnosis not present

## 2023-09-24 LAB — CMP (CANCER CENTER ONLY)
ALT: 19 U/L (ref 0–44)
AST: 18 U/L (ref 15–41)
Albumin: 3.9 g/dL (ref 3.5–5.0)
Alkaline Phosphatase: 92 U/L (ref 38–126)
Anion gap: 3 — ABNORMAL LOW (ref 5–15)
BUN: 17 mg/dL (ref 8–23)
CO2: 32 mmol/L (ref 22–32)
Calcium: 8.8 mg/dL — ABNORMAL LOW (ref 8.9–10.3)
Chloride: 107 mmol/L (ref 98–111)
Creatinine: 0.64 mg/dL (ref 0.44–1.00)
GFR, Estimated: >60 mL/min (ref >60)
Glucose, Bld: 61 mg/dL — ABNORMAL LOW (ref 70–99)
Potassium: 4.4 mmol/L (ref 3.5–5.1)
Sodium: 142 mmol/L (ref 135–145)
Total Bilirubin: 1 mg/dL (ref <1.2)
Total Protein: 5.7 g/dL — ABNORMAL LOW (ref 6.5–8.1)

## 2023-09-24 LAB — CBC WITH DIFFERENTIAL (CANCER CENTER ONLY)
Abs Immature Granulocytes: 0 10*3/uL (ref 0.00–0.07)
Basophils Absolute: 0.1 10*3/uL (ref 0.0–0.1)
Basophils Relative: 1 %
Eosinophils Absolute: 0.1 10*3/uL (ref 0.0–0.5)
Eosinophils Relative: 2 %
HCT: 41.4 % (ref 36.0–46.0)
Hemoglobin: 13.3 g/dL (ref 12.0–15.0)
Immature Granulocytes: 0 %
Lymphocytes Relative: 37 %
Lymphs Abs: 1.5 10*3/uL (ref 0.7–4.0)
MCH: 30.7 pg (ref 26.0–34.0)
MCHC: 32.1 g/dL (ref 30.0–36.0)
MCV: 95.6 fL (ref 80.0–100.0)
Monocytes Absolute: 0.5 10*3/uL (ref 0.1–1.0)
Monocytes Relative: 13 %
Neutro Abs: 1.9 10*3/uL (ref 1.7–7.7)
Neutrophils Relative %: 47 %
Platelet Count: 144 10*3/uL — ABNORMAL LOW (ref 150–400)
RBC: 4.33 MIL/uL (ref 3.87–5.11)
RDW: 14 % (ref 11.5–15.5)
WBC Count: 4 10*3/uL (ref 4.0–10.5)
nRBC: 0 % (ref 0.0–0.2)

## 2023-09-24 MED ORDER — DARATUMUMAB-HYALURONIDASE-FIHJ 1800-30000 MG-UT/15ML ~~LOC~~ SOLN
1800.0000 mg | Freq: Once | SUBCUTANEOUS | Status: AC
Start: 2023-09-24 — End: 2023-09-24
  Administered 2023-09-24: 1800 mg via SUBCUTANEOUS
  Filled 2023-09-24: qty 15

## 2023-09-24 MED ORDER — LORATADINE 10 MG PO TABS
10.0000 mg | ORAL_TABLET | Freq: Every day | ORAL | Status: DC
  Administered 2023-09-24: 10 mg via ORAL

## 2023-09-24 MED ORDER — ACETAMINOPHEN 325 MG PO TABS
650.0000 mg | ORAL_TABLET | Freq: Once | ORAL | Status: AC
  Administered 2023-09-24: 650 mg via ORAL
  Filled 2023-09-24: qty 2

## 2023-09-24 MED ORDER — DIPHENHYDRAMINE HCL 25 MG PO CAPS
50.0000 mg | ORAL_CAPSULE | Freq: Once | ORAL | Status: DC
  Filled 2023-09-24: qty 2

## 2023-09-24 NOTE — Patient Instructions (Signed)

## 2023-09-25 NOTE — Progress Notes (Unsigned)
72 y.o. G3P2 Married White or Caucasian female here for breast and pelvic exam.  I am also following her for ***.  Denies vaginal bleeding.  Patient's last menstrual period was 10/16/2001.          Sexually active: {yes no:314532}  H/O STD:  {YES NO:22349}  Health Maintenance: PCP:  ****.  Last wellness appt was ***.  Did blood work at that appt:   Vaccines are up to date:  {YES NO:22349} Colonoscopy:  08/18/2014 MMG:  04/06/2021 Negative BMD:  *** Last pap smear:  05/15/2019 Negative.   H/o abnormal pap smear:      reports that she has never smoked. She has never been exposed to tobacco smoke. She has never used smokeless tobacco. She reports current alcohol use of about 2.0 standard drinks of alcohol per week. She reports that she does not use drugs.  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    Sigmoid diverticulosis    Wears glasses     Past Surgical History:  Procedure Laterality Date   BREAST BIOPSY Right 09/2011   benign    COLONOSCOPY  2010   CYSTOSCOPY/RETROGRADE/URETEROSCOPY/STONE EXTRACTION WITH BASKET Left 04/21/2014   Procedure: cystoscopy, left retrograde pylegram, LEFT URETEROSCOPY, laser lithotripsy with holmium laser, STONE EXTRACTION, uretheral calibration;  Surgeon: Anner Crete, MD;  Location: [facility];  Service: Urology;  Laterality: Left;   DILATION AND EVACUATION  1995   EPISIOTOMY REPAIR, SEVERE TEAR  1989   LAPAROSCOPIC OVARIAN CYSTECTOMY  10/ 2000   NM MYOCAR PERF WALL MOTION  12/15/2009   Normal   US ECHOCARDIOGRAPHY  12/15/2009   Normal study for age: mild MR,TR and trace PI    Current Outpatient Medications  Medication Sig Dispense Refill   acetaminophen (TYLENOL) 500 MG tablet Take 500 mg by mouth every 6 (six) hours as needed for mild pain or headache.       acyclovir (ZOVIRAX) 400 MG tablet Take 1 tablet (400 mg total) by mouth 2 (two) times daily. 180 tablet 1   allopurinol (ZYLOPRIM) 300 MG tablet TAKE 1 TABLET BY MOUTH EVERY DAY 90 tablet 0   aspirin EC 81 MG tablet Take 81 mg by mouth daily. Swallow whole.     cetirizine (ZYRTEC) 5 MG tablet Take 5 mg by mouth daily as needed for allergies.     cholecalciferol (VITAMIN D) 1000 UNITS tablet Take 1,000 Units by mouth every morning.      Cyanocobalamin (VITAMIN B 12 PO) Take 1,000 mcg by mouth daily.     Daratumumab-Hyaluronidase-fihj (DARZALEX FASPRO [place]) Inject into the skin once a week. Every Monday     dexamethasone (DECADRON) 4 MG tablet Take 10 tablets (40 mg total) by mouth once a week. Take once weekly on the day you receive the chemotherapy injection. 40 tablet 5   doxycycline (VIBRAMYCIN) 50 MG capsule Take 50 mg by mouth daily.     ELDERBERRY PO Take 1 tablet by mouth daily.     fluticasone (FLONASE) 50 MCG/ACT nasal spray Place into the nose as needed.     lenalidomide (REVLIMID) 10 MG capsule Take 1 capsule (10 mg total) by mouth daily. [employer] Auth # [id number]  Date Obtained 09/12/23 Take 1 capsule by mouth daily for 14 days then none  for 7 days 14 capsule 0   ondansetron (ZOFRAN) 8 MG tablet Take 1 tablet (8 mg total) by mouth every 8 (eight) hours as needed. 30 tablet 0   prochlorperazine (COMPAZINE) 10 MG tablet Take 1 tablet (10 mg total) by mouth every 6 (six) hours as needed for nausea or vomiting. 30 tablet 0   rosuvastatin (CRESTOR) 5 MG tablet Take 5 mg by mouth daily.     valACYclovir (VALTREX) 500 MG tablet TAKE 4 TABLETS BY MOUTH EVERY 12 HOURS X 1 DAY FOR FEVER BLISTER AS NEEDED. 30 tablet 2   No current facility-administered medications for this visit.    Family History  Problem Relation Age of Onset   Diabetes Mother    Congestive Heart Failure Mother    Stroke Mother    Osteoporosis Mother    Other Mother        gallbladder ruptured   Heart attack Father     Diabetes Sister    Heart disease Sister    Heart disease Sister        x2   Hypertension Sister    Thyroid disease Sister    Hypertension Sister    Diabetes Brother    Dementia Brother    Colon cancer Brother        mets to liver   Heart disease Brother        pacemaker   Stroke Brother    Liver cancer Brother    Heart disease Brother    Stomach cancer Neg Hx    Pancreatic cancer Neg Hx    Esophageal cancer Neg Hx     Review of Systems  Exam:   LMP 10/16/2001      General appearance: alert, cooperative and appears stated age Breasts: {Exam; breast:13139::"normal appearance, no masses or tenderness"} Abdomen: soft, non-tender; bowel sounds normal; no masses,  no organomegaly Lymph nodes: Cervical, supraclavicular, and axillary nodes normal.  No abnormal inguinal nodes palpated Neurologic: Grossly normal  Pelvic: External genitalia:  no lesions              Urethra:  normal appearing urethra with no masses, tenderness or lesions              Bartholins and Skenes: normal                 Vagina: normal appearing vagina with atrophic changes ***and no discharge, no lesions              Cervix: {exam; cervix:14595}              Pap taken: {yes no:314532} Bimanual Exam:  Uterus:  {exam; uterus:12215}              Adnexa: {exam; adnexa:12223}               Rectovaginal: Confirms               Anus:  normal sphincter tone, no lesions  Chaperone, ***, CMA, was present for exam.  Assessment/Plan: There are no diagnoses linked to this encounter.

## 2023-09-27 ENCOUNTER — Ambulatory Visit (HOSPITAL_BASED_OUTPATIENT_CLINIC_OR_DEPARTMENT_OTHER): Payer: Medicare HMO | Admitting: Obstetrics & Gynecology

## 2023-09-27 ENCOUNTER — Telehealth: Payer: Self-pay | Admitting: *Deleted

## 2023-09-27 NOTE — Telephone Encounter (Signed)
-----   Message from Ulysees Barns IV sent at 09/22/2023 11:05 AM EST ----- Please let Mrs. Flannigan know that her Lambda light chains have dropped to 60 down from 106. This is a good response to therapy. We will continue our current treatment as scheduled. ----- Message ----- From: Leory Plowman, Lab In [place] Sent: 09/17/2023   1:50 PM EST To: Jaci Standard, MD

## 2023-09-27 NOTE — Telephone Encounter (Signed)
TCT patient regarding recent lab results. Spoke with her. Advised that her Lambda light chains have dropped to 60 down from 106. This is a good response to therapy. We will continue our current treatment as scheduled. Pt voiced understanding. Reviewed her upcoming appts with her.

## 2023-09-29 ENCOUNTER — Other Ambulatory Visit: Payer: Self-pay

## 2023-10-01 ENCOUNTER — Inpatient Hospital Stay: Payer: Medicare HMO

## 2023-10-01 ENCOUNTER — Other Ambulatory Visit: Payer: Medicare HMO

## 2023-10-01 ENCOUNTER — Ambulatory Visit: Payer: Medicare HMO

## 2023-10-01 VITALS — BP 133/61 | HR 83 | Temp 98.2°F | Resp 18 | Wt 138.4 lb

## 2023-10-01 DIAGNOSIS — C9000 Multiple myeloma not having achieved remission: Secondary | ICD-10-CM

## 2023-10-01 DIAGNOSIS — Z5112 Encounter for antineoplastic immunotherapy: Secondary | ICD-10-CM | POA: Diagnosis not present

## 2023-10-01 LAB — CBC WITH DIFFERENTIAL (CANCER CENTER ONLY)
Abs Immature Granulocytes: 0.01 10*3/uL (ref 0.00–0.07)
Basophils Absolute: 0 10*3/uL (ref 0.0–0.1)
Basophils Relative: 1 %
Eosinophils Absolute: 0 10*3/uL (ref 0.0–0.5)
Eosinophils Relative: 0 %
HCT: 44.4 % (ref 36.0–46.0)
Hemoglobin: 14.5 g/dL (ref 12.0–15.0)
Immature Granulocytes: 0 %
Lymphocytes Relative: 16 %
Lymphs Abs: 0.9 10*3/uL (ref 0.7–4.0)
MCH: 30.9 pg (ref 26.0–34.0)
MCHC: 32.7 g/dL (ref 30.0–36.0)
MCV: 94.5 fL (ref 80.0–100.0)
Monocytes Absolute: 0.1 10*3/uL (ref 0.1–1.0)
Monocytes Relative: 1 %
Neutro Abs: 4.6 10*3/uL (ref 1.7–7.7)
Neutrophils Relative %: 82 %
Platelet Count: 154 10*3/uL (ref 150–400)
RBC: 4.7 MIL/uL (ref 3.87–5.11)
RDW: 13.9 % (ref 11.5–15.5)
WBC Count: 5.6 10*3/uL (ref 4.0–10.5)
nRBC: 0 % (ref 0.0–0.2)

## 2023-10-01 LAB — CMP (CANCER CENTER ONLY)
ALT: 22 U/L (ref 0–44)
AST: 20 U/L (ref 15–41)
Albumin: 4.3 g/dL (ref 3.5–5.0)
Alkaline Phosphatase: 93 U/L (ref 38–126)
Anion gap: 6 (ref 5–15)
BUN: 21 mg/dL (ref 8–23)
CO2: 29 mmol/L (ref 22–32)
Calcium: 9.5 mg/dL (ref 8.9–10.3)
Chloride: 105 mmol/L (ref 98–111)
Creatinine: 0.7 mg/dL (ref 0.44–1.00)
GFR, Estimated: >60 mL/min (ref >60)
Glucose, Bld: 112 mg/dL — ABNORMAL HIGH (ref 70–99)
Potassium: 3.7 mmol/L (ref 3.5–5.1)
Sodium: 140 mmol/L (ref 135–145)
Total Bilirubin: 1.3 mg/dL — ABNORMAL HIGH (ref <1.2)
Total Protein: 6 g/dL — ABNORMAL LOW (ref 6.5–8.1)

## 2023-10-01 MED ORDER — LORATADINE 10 MG PO TABS
10.0000 mg | ORAL_TABLET | Freq: Once | ORAL | Status: AC
Start: 2023-10-01 — End: 2023-10-01
  Administered 2023-10-01: 10 mg via ORAL
  Filled 2023-10-01: qty 1

## 2023-10-01 MED ORDER — ACETAMINOPHEN 325 MG PO TABS
650.0000 mg | ORAL_TABLET | Freq: Once | ORAL | Status: AC
Start: 2023-10-01 — End: 2023-10-01
  Administered 2023-10-01: 650 mg via ORAL
  Filled 2023-10-01: qty 2

## 2023-10-01 MED ORDER — DARATUMUMAB-HYALURONIDASE-FIHJ 1800-30000 MG-UT/15ML ~~LOC~~ SOLN
1800.0000 mg | Freq: Once | SUBCUTANEOUS | Status: AC
  Administered 2023-10-01: 1800 mg via SUBCUTANEOUS
  Filled 2023-10-01: qty 15

## 2023-10-01 NOTE — Patient Instructions (Signed)
 [facility] - A DEPT OF [facility]  Discharge Instructions: Thank you for choosing [facility] to provide your oncology and hematology care.   If you have a lab appointment with the Cancer Center, please go directly to the Cancer Center and check in at the registration area.   Wear comfortable clothing and clothing appropriate for easy access to any Portacath or PICC line.   We strive to give you quality time with your provider. You may need to reschedule your appointment if you arrive late (15 or more minutes).  Arriving late affects you and other patients whose appointments are after yours.  Also, if you miss three or more appointments without notifying the office, you may be dismissed from the clinic at the provider's discretion.      For prescription refill requests, have your pharmacy contact our office and allow 72 hours for refills to be completed.    Today you received the following chemotherapy and/or immunotherapy agents: [name].       To help prevent nausea and vomiting after your treatment, we encourage you to take your nausea medication as directed.  BELOW ARE SYMPTOMS THAT SHOULD BE REPORTED IMMEDIATELY: *FEVER GREATER THAN 100.4 F (38 C) OR HIGHER *CHILLS OR SWEATING *NAUSEA AND VOMITING THAT IS NOT CONTROLLED WITH YOUR NAUSEA MEDICATION *UNUSUAL SHORTNESS OF BREATH *UNUSUAL BRUISING OR BLEEDING *URINARY PROBLEMS (pain or burning when urinating, or frequent urination) *BOWEL PROBLEMS (unusual diarrhea, constipation, pain near the anus) TENDERNESS IN MOUTH AND THROAT WITH OR WITHOUT PRESENCE OF ULCERS (sore throat, sores in mouth, or a toothache) UNUSUAL RASH, SWELLING OR PAIN  UNUSUAL VAGINAL DISCHARGE OR ITCHING   Items with * indicate a potential emergency and should be followed up as soon as possible or go to the Emergency Department if any problems should occur.  Please show the CHEMOTHERAPY ALERT CARD or  IMMUNOTHERAPY ALERT CARD at check-in to the Emergency Department and triage nurse.  Should you have questions after your visit or need to cancel or reschedule your appointment, please contact [facility] - A [facility]  Dept: [phone]  and follow the prompts.  Office hours are 8:00 a.m. to 4:30 p.m. Monday - Friday. Please note that voicemails left after 4:00 p.m. may not be returned until the following business day.  We are closed weekends and major holidays. You have access to a nurse at all times for urgent questions. Please call the main number to the clinic Dept: [phone] and follow the prompts.   For any non-urgent questions, you may also contact your provider using MyChart. We now offer e-Visits for anyone 21 and older to request care online for non-urgent symptoms. For details visit [email or web address].   Also download the MyChart app! Go to the app store, search "MyChart", open the app, select [place], and log in with your MyChart username and password.

## 2023-10-01 NOTE — Progress Notes (Signed)
Pt states she took decadron at home today prior to arriving for infusion.

## 2023-10-02 ENCOUNTER — Other Ambulatory Visit: Payer: Self-pay | Admitting: Hematology and Oncology

## 2023-10-02 ENCOUNTER — Other Ambulatory Visit: Payer: Self-pay | Admitting: *Deleted

## 2023-10-02 DIAGNOSIS — C9000 Multiple myeloma not having achieved remission: Secondary | ICD-10-CM

## 2023-10-02 LAB — KAPPA/LAMBDA LIGHT CHAINS
Kappa free light chain: 3.3 mg/L (ref 3.3–19.4)
Kappa, lambda light chain ratio: 0.05 — ABNORMAL LOW (ref 0.26–1.65)
Lambda free light chains: 64.3 mg/L — ABNORMAL HIGH (ref 5.7–26.3)

## 2023-10-02 MED ORDER — LENALIDOMIDE 10 MG PO CAPS: 10.0000 mg | ORAL_CAPSULE | Freq: Every day | ORAL | 0 refills | Status: DC

## 2023-10-04 ENCOUNTER — Encounter: Payer: Self-pay | Admitting: Hematology and Oncology

## 2023-10-04 DIAGNOSIS — J069 Acute upper respiratory infection, unspecified: Secondary | ICD-10-CM | POA: Diagnosis not present

## 2023-10-04 DIAGNOSIS — R059 Cough, unspecified: Secondary | ICD-10-CM | POA: Diagnosis not present

## 2023-10-04 DIAGNOSIS — C9000 Multiple myeloma not having achieved remission: Secondary | ICD-10-CM | POA: Diagnosis not present

## 2023-10-04 DIAGNOSIS — Z1152 Encounter for screening for COVID-19: Secondary | ICD-10-CM | POA: Diagnosis not present

## 2023-10-04 DIAGNOSIS — J029 Acute pharyngitis, unspecified: Secondary | ICD-10-CM | POA: Diagnosis not present

## 2023-10-08 ENCOUNTER — Inpatient Hospital Stay: Payer: Medicare HMO

## 2023-10-08 ENCOUNTER — Inpatient Hospital Stay: Payer: Medicare HMO | Admitting: Hematology and Oncology

## 2023-10-08 ENCOUNTER — Other Ambulatory Visit: Payer: Medicare HMO

## 2023-10-08 ENCOUNTER — Ambulatory Visit: Payer: Medicare HMO

## 2023-10-08 ENCOUNTER — Ambulatory Visit: Payer: Medicare HMO | Admitting: Hematology and Oncology

## 2023-10-08 VITALS — BP 130/63 | HR 88 | Temp 99.2°F | Resp 15 | Wt 138.9 lb

## 2023-10-08 DIAGNOSIS — C9000 Multiple myeloma not having achieved remission: Secondary | ICD-10-CM

## 2023-10-08 DIAGNOSIS — Z5112 Encounter for antineoplastic immunotherapy: Secondary | ICD-10-CM | POA: Diagnosis not present

## 2023-10-08 LAB — CBC WITH DIFFERENTIAL (CANCER CENTER ONLY)
Abs Immature Granulocytes: 0.02 10*3/uL (ref 0.00–0.07)
Basophils Absolute: 0 10*3/uL (ref 0.0–0.1)
Basophils Relative: 1 %
Eosinophils Absolute: 0.2 10*3/uL (ref 0.0–0.5)
Eosinophils Relative: 3 %
HCT: 41.6 % (ref 36.0–46.0)
Hemoglobin: 14.1 g/dL (ref 12.0–15.0)
Immature Granulocytes: 0 %
Lymphocytes Relative: 39 %
Lymphs Abs: 2.3 10*3/uL (ref 0.7–4.0)
MCH: 31.5 pg (ref 26.0–34.0)
MCHC: 33.9 g/dL (ref 30.0–36.0)
MCV: 93.1 fL (ref 80.0–100.0)
Monocytes Absolute: 0.6 10*3/uL (ref 0.1–1.0)
Monocytes Relative: 11 %
Neutro Abs: 2.7 10*3/uL (ref 1.7–7.7)
Neutrophils Relative %: 46 %
Platelet Count: 141 10*3/uL — ABNORMAL LOW (ref 150–400)
RBC: 4.47 MIL/uL (ref 3.87–5.11)
RDW: 13.8 % (ref 11.5–15.5)
WBC Count: 5.8 10*3/uL (ref 4.0–10.5)
nRBC: 0 % (ref 0.0–0.2)

## 2023-10-08 LAB — CMP (CANCER CENTER ONLY)
ALT: 24 U/L (ref 0–44)
AST: 20 U/L (ref 15–41)
Albumin: 4.1 g/dL (ref 3.5–5.0)
Alkaline Phosphatase: 95 U/L (ref 38–126)
Anion gap: 6 (ref 5–15)
BUN: 13 mg/dL (ref 8–23)
CO2: 31 mmol/L (ref 22–32)
Calcium: 9 mg/dL (ref 8.9–10.3)
Chloride: 106 mmol/L (ref 98–111)
Creatinine: 0.73 mg/dL (ref 0.44–1.00)
GFR, Estimated: >60 mL/min (ref >60)
Glucose, Bld: 101 mg/dL — ABNORMAL HIGH (ref 70–99)
Potassium: 3.3 mmol/L — ABNORMAL LOW (ref 3.5–5.1)
Sodium: 143 mmol/L (ref 135–145)
Total Bilirubin: 1.4 mg/dL — ABNORMAL HIGH (ref <1.2)
Total Protein: 5.7 g/dL — ABNORMAL LOW (ref 6.5–8.1)

## 2023-10-08 MED ORDER — DARATUMUMAB-HYALURONIDASE-FIHJ 1800-30000 MG-UT/15ML ~~LOC~~ SOLN
1800.0000 mg | Freq: Once | SUBCUTANEOUS | Status: AC
Start: 2023-10-08 — End: 2023-10-08
  Administered 2023-10-08: 1800 mg via SUBCUTANEOUS
  Filled 2023-10-08: qty 15

## 2023-10-08 MED ORDER — LORATADINE 10 MG PO TABS
10.0000 mg | ORAL_TABLET | Freq: Once | ORAL | Status: AC
Start: 2023-10-08 — End: 2023-10-08
  Administered 2023-10-08: 10 mg via ORAL
  Filled 2023-10-08: qty 1

## 2023-10-08 MED ORDER — ACETAMINOPHEN 325 MG PO TABS
650.0000 mg | ORAL_TABLET | Freq: Once | ORAL | Status: AC
  Administered 2023-10-08: 650 mg via ORAL
  Filled 2023-10-08: qty 2

## 2023-10-08 NOTE — Patient Instructions (Signed)

## 2023-10-08 NOTE — Progress Notes (Signed)
Patient taking her steroid now -10/08/23 at 1023. Per Dr. Leonides Schanz- ok for treatment today- patient has a cold.

## 2023-10-08 NOTE — Progress Notes (Signed)
[facility] [phone]   [phone]  PROGRESS NOTE  Patient Care Team: Rodrigo Ran, MD as PCP - General (Internal Medicine)  Hematological/Oncological History # Free Lambda Multiple Myeloma 11/10/2022: WBC 5.68, Hgb 11.7, MCV 97.4, Plt 146 04/17/2023: WBC 4.53, Hgb 11.5, MCV 107.6, Plt 110 05/16/2023: establish care with Dr. Leonides Schanz. Labs showed M protein 0.1 with Lambda 900, Kappa 3 with ratio 0.00.   06/01/2023: bone marrow biopsy performed, showed plasma cell myeloma nearly effacing approximately 80% of the cellular marrow.  06/25/2023: Cycle 1 Day 1 of VRD chemotherapy 07/23/2023: Cycle 2 Day 1 of VRD chemotherapy 08/13/2023: Cycle 3 Day 1 of VRD chemotherapy 09/03/2023: Cycle 1 Day 1 of Dara/Rev/Dex. Transitioned off velcade due to ocular toxicity.  Interval History:  Brooke Whitaker 72 y.o. female with medical history significant for newly diagnosed multiple myeloma who presents for a follow up visit. The patient's last visit was on 09/24/2023. In the interim since the last visit she has had no major changes in her health.  On exam today Brooke Whitaker reports she is tolerating the Darzalex shots well but is having some issues with rash.  It is on her arms as well as her back.  She reports it was a light pink and is mostly "stinging".  It does not often itch but when it does itch she takes an Benadryl which has been effective.  She is not having any rash on her legs.  She reports that she is currently having a cold with some cough and a sore throat.  Other than that she has felt fine.  She was tested for COVID and found to be negative and was tested for strep and flu as well.  She reports that she is taking Tylenol and drinking plenty of fluids.  She was given a Z-Pak as well in case her symptoms worsened.  She reports that overall she is willing and able to continue with Darzalex therapy at this time.  She denies any fevers, chills, sweats, nausea, vomiting, or  diarrhea.  A full 10 point ROS is otherwise negative.  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    Sigmoid diverticulosis    Wears glasses     SURGICAL HISTORY: Past Surgical History:  Procedure Laterality Date   BREAST BIOPSY Right 09/2011   benign    COLONOSCOPY  2010   CYSTOSCOPY/RETROGRADE/URETEROSCOPY/STONE EXTRACTION WITH BASKET Left 04/21/2014   Procedure: cystoscopy, left retrograde pylegram, LEFT URETEROSCOPY, laser lithotripsy with holmium laser, STONE EXTRACTION, uretheral calibration;  Surgeon: Anner Crete, MD;  Location: [facility];  Service: Urology;  Laterality: Left;   DILATION AND EVACUATION  1995   EPISIOTOMY REPAIR, SEVERE TEAR  1989   LAPAROSCOPIC OVARIAN CYSTECTOMY  10/ 2000   NM MYOCAR PERF WALL MOTION  12/15/2009   Normal   US ECHOCARDIOGRAPHY  12/15/2009   Normal study for age: mild MR,TR and trace PI    SOCIAL HISTORY: Social History   Socioeconomic History   Marital status: Married    Spouse name: Not on file   Number of children: 2   Years of education: Not on file   Highest education level: Not on file  Occupational History   Occupation: Print production planner  Tobacco Use  Smoking status: Never    Passive exposure: Never   Smokeless tobacco: Never  Vaping Use   Vaping status: Never Used  Substance and Sexual Activity   Alcohol use: Yes    Alcohol/week: 2.0 standard drinks of alcohol    Types: 2 Glasses of wine per week   Drug use: No   Sexual activity: Not Currently    Partners: Male  Other Topics Concern   Not on file  Social History Narrative   Not on file   Social Drivers of Health   Financial Resource Strain: Not on file  Food Insecurity: No Food Insecurity (05/16/2023)   Hunger Vital Sign    Worried About Running Out of Food in the Last  Year: Never true    Ran Out of Food in the Last Year: Never true  Transportation Needs: No Transportation Needs (05/16/2023)   PRAPARE - Administrator, Civil Service (Medical): No    Lack of Transportation (Non-Medical): No  Physical Activity: Not on file  Stress: Not on file  Social Connections: Not on file  Intimate Partner Violence: Not At Risk (05/16/2023)   Humiliation, Afraid, Rape, and Kick questionnaire    Fear of Current or Ex-Partner: No    Emotionally Abused: No    Physically Abused: No    Sexually Abused: No    FAMILY HISTORY: Family History  Problem Relation Age of Onset   Diabetes Mother    Congestive Heart Failure Mother    Stroke Mother    Osteoporosis Mother    Other Mother        gallbladder ruptured   Heart attack Father    Diabetes Sister    Heart disease Sister    Heart disease Sister        x2   Hypertension Sister    Thyroid disease Sister    Hypertension Sister    Diabetes Brother    Dementia Brother    Colon cancer Brother        mets to liver   Heart disease Brother        pacemaker   Stroke Brother    Liver cancer Brother    Heart disease Brother    Stomach cancer Neg Hx    Pancreatic cancer Neg Hx    Esophageal cancer Neg Hx     ALLERGIES:  is allergic to penicillins and sulfa antibiotics.  MEDICATIONS:  Current Outpatient Medications  Medication Sig Dispense Refill   acetaminophen (TYLENOL) 500 MG tablet Take 500 mg by mouth every 6 (six) hours as needed for mild pain or headache.      acyclovir (ZOVIRAX) 400 MG tablet Take 1 tablet (400 mg total) by mouth 2 (two) times daily. 180 tablet 1   allopurinol (ZYLOPRIM) 300 MG tablet TAKE 1 TABLET BY MOUTH EVERY DAY 90 tablet 0   aspirin EC 81 MG tablet Take 81 mg by mouth daily. Swallow whole.     cetirizine (ZYRTEC) 5 MG tablet Take 5 mg by mouth daily as needed for allergies.     cholecalciferol (VITAMIN D) 1000 UNITS tablet Take 1,000 Units by mouth every morning.       Cyanocobalamin (VITAMIN B 12 PO) Take 1,000 mcg by mouth daily.     Daratumumab-Hyaluronidase-fihj (DARZALEX FASPRO [place]) Inject into the skin once a week. Every Monday     dexamethasone (DECADRON) 4 MG tablet Take 10 tablets (40 mg total) by mouth once a week. Take once weekly on the day you receive  the chemotherapy injection. 40 tablet 5   doxycycline (VIBRAMYCIN) 50 MG capsule Take 50 mg by mouth daily.     ELDERBERRY PO Take 1 tablet by mouth daily.     fluticasone (FLONASE) 50 MCG/ACT nasal spray Place into the nose as needed.     lenalidomide (REVLIMID) 10 MG capsule Take 1 capsule (10 mg total) by mouth daily. Siri Cole #[id number]  Date Obtained 10/02/23 Take 1 capsule by mouth daily for 14 days then none for 7 days 14 capsule 0   ondansetron (ZOFRAN) 8 MG tablet Take 1 tablet (8 mg total) by mouth every 8 (eight) hours as needed. 30 tablet 0   prochlorperazine (COMPAZINE) 10 MG tablet Take 1 tablet (10 mg total) by mouth every 6 (six) hours as needed for nausea or vomiting. 30 tablet 0   rosuvastatin (CRESTOR) 5 MG tablet Take 5 mg by mouth daily.     valACYclovir (VALTREX) 500 MG tablet TAKE 4 TABLETS BY MOUTH EVERY 12 HOURS X 1 DAY FOR FEVER BLISTER AS NEEDED. 30 tablet 2   No current facility-administered medications for this visit.    REVIEW OF SYSTEMS:   Constitutional: ( - ) fevers, ( - )  chills , ( - ) night sweats Eyes: ( - ) blurriness of vision, ( - ) double vision, ( - ) watery eyes Ears, nose, mouth, throat, and face: ( - ) mucositis, ( - ) sore throat Respiratory: ( - ) cough, ( - ) dyspnea, ( - ) wheezes Cardiovascular: ( - ) palpitation, ( - ) chest discomfort, ( - ) lower extremity swelling Gastrointestinal:  ( - ) nausea, ( - ) heartburn, ( - ) change in bowel habits Skin: ( - ) abnormal skin rashes Lymphatics: ( - ) new lymphadenopathy, ( - ) easy bruising Neurological: ( - ) numbness, ( - ) tingling, ( - ) new weaknesses Behavioral/Psych: ( - ) mood change, ( - )  new changes  All other systems were reviewed with the patient and are negative.  PHYSICAL EXAMINATION: ECOG PERFORMANCE STATUS: 0 - Asymptomatic  Vitals:   10/08/23 0921  BP: 130/63  Pulse: 88  Resp: 15  Temp: 99.2 F (37.3 C)  SpO2: 98%    Filed Weights   10/08/23 0921  Weight: 138 lb 14.4 oz (63 kg)   GENERAL: Well-appearing elderly Caucasian female, alert, no distress and comfortable SKIN: skin color, texture, turgor are normal, no rashes or significant lesions EYES: conjunctiva are pink and non-injected, sclera clear LUNGS: clear to auscultation and percussion with normal breathing effort HEART: regular rate & rhythm and no murmurs and no lower extremity edema Musculoskeletal: no cyanosis of digits and no clubbing  PSYCH: alert & oriented x 3, fluent speech NEURO: no focal motor/sensory deficits  LABORATORY DATA:  I have reviewed the data as listed    Latest Ref Rng & Units 10/08/2023    8:46 AM 10/01/2023    1:55 PM 09/24/2023   10:19 AM  CBC  WBC 4.0 - 10.5 K/uL 5.8  5.6  4.0   Hemoglobin 12.0 - 15.0 g/dL 01.6  01.0  93.2   Hematocrit 36.0 - 46.0 % 41.6  44.4  41.4   Platelets 150 - 400 K/uL 141  154  144        Latest Ref Rng & Units 10/08/2023    8:46 AM 10/01/2023    1:55 PM 09/24/2023   10:19 AM  CMP  Glucose 70 - 99 mg/dL 355  112  61   BUN 8 - 23 mg/dL 13  21  17    Creatinine 0.44 - 1.00 mg/dL 7.25  3.66  4.40   Sodium 135 - 145 mmol/L 143  140  142   Potassium 3.5 - 5.1 mmol/L 3.3  3.7  4.4   Chloride 98 - 111 mmol/L 106  105  107   CO2 22 - 32 mmol/L 31  29  32   Calcium 8.9 - 10.3 mg/dL 9.0  9.5  8.8   Total Protein 6.5 - 8.1 g/dL 5.7  6.0  5.7   Total Bilirubin <1.2 mg/dL 1.4  1.3  1.0   Alkaline Phos 38 - 126 U/L 95  93  92   AST 15 - 41 U/L 20  20  18    ALT 0 - 44 U/L 24  22  19      Lab Results  Component Value Date   MPROTEIN 0.1 (H) 09/17/2023   MPROTEIN Not Observed 08/28/2023   MPROTEIN Not Observed 08/13/2023   Lab Results   Component Value Date   KPAFRELGTCHN 3.3 10/01/2023   KPAFRELGTCHN 3.6 09/17/2023   KPAFRELGTCHN 6.1 08/28/2023   LAMBDASER 64.3 (H) 10/01/2023   LAMBDASER 60.9 (H) 09/17/2023   LAMBDASER 106.2 (H) 08/28/2023   KAPLAMBRATIO 0.05 (L) 10/01/2023   KAPLAMBRATIO 0.06 (L) 09/17/2023   KAPLAMBRATIO 0.06 (L) 08/28/2023    RADIOGRAPHIC STUDIES: No results found.  ASSESSMENT & PLAN Brooke Whitaker 72 y.o. female with medical history significant for newly diagnosed multiple myeloma who presents for a follow up visit.   Previously we discussed the diagnosis of multiple myeloma.  We discussed that this is a blood cancer that is not curable.  The goal is to get the patient into a durable remission with treatment.  We discussed the need for a minimum of 6 months of chemotherapy treatment then followed by maintenance therapy if the M protein has reached 0 and the free light chains are within normal limits.  We discussed expected side effects of the regimen and the plan moving forward.  The patient voiced her understanding of our findings, expected side effects, and risks/benefits.  She is okay with proceeding with chemotherapy at this time.  # Free Lambda Multiple Myeloma -- Diagnosis confirmed by the presence of a macrocytic anemia and 80% plasma cells in the bone marrow --Initially started with VRD chemotherapy with the patient developed rash as result of the Revlimid and ocular toxicity from the Velcade.  Dropped the Revlimid down to 10 mg p.o. daily and transition to Darzalex for her next treatment. --UPEP showed marked Bence Jones proteins in the urine, no evidence of lytic lesions on metastatic survey PLAN: --Today is Cycle 2 Day 8 of Dara/Rev/Dex --Labs today show white blood cell 5.8, Hgb 14.1, MCV 93.1, Plt 141 --On 10/01/2023 patient had kappa 3.3, lambda 64.3, ratio 0.05 -- Plan for return to clinic for Cycle 2 Day 22 of Darzalex/rev/Dex   #Supportive Care -- chemotherapy education  complete -- port placement not required  -- ASA 81 mg PO daily to be taken with revlimid.  -- zofran 8mg  q8H PRN and compazine 10mg  PO q6H for nausea -- acyclovir 400mg  PO BID for VCZ prophylaxis -- allopurinol 300mg  PO daily for TLS prophylaxis -- EMLA cream for port --Will discuss the need for Zometa treatment after symptoms of her regimen have stabilized. -- no pain medication required at this time.    Orders Placed This Encounter  Procedures   Kappa/lambda  light chains    Standing Status:   Future    Expected Date:   11/27/2023    Expiration Date:   11/26/2024   Multiple Myeloma Panel (SPEP&IFE w/QIG)    Standing Status:   Future    Expected Date:   11/27/2023    Expiration Date:   11/26/2024   CMP (Cancer Center only)    Standing Status:   Future    Expected Date:   11/27/2023    Expiration Date:   11/26/2024   CBC with Differential (Cancer Center Only)    Standing Status:   Future    Expected Date:   11/27/2023    Expiration Date:   11/26/2024   CMP (Cancer Center only)    Standing Status:   Future    Expected Date:   12/11/2023    Expiration Date:   12/10/2024   CBC with Differential (Cancer Center Only)    Standing Status:   Future    Expected Date:   12/11/2023    Expiration Date:   12/10/2024    All questions were answered. The patient knows to call the clinic with any problems, questions or concerns.  A total of more than 30 minutes were spent on this encounter with face-to-face time and non-face-to-face time, including preparing to see the patient, ordering tests and/or medications, counseling the patient and coordination of care as outlined above.   Ulysees Barns, MD Department of Hematology/Oncology [facility] Phone: [phone] Pager: [phone] Email: [email or web address]  10/08/2023 4:07 PM

## 2023-10-10 LAB — MULTIPLE MYELOMA PANEL, SERUM
Albumin SerPl Elph-Mcnc: 3.7 g/dL (ref 2.9–4.4)
Albumin/Glob SerPl: 1.7 (ref 0.7–1.7)
Alpha 1: 0.2 g/dL (ref 0.0–0.4)
Alpha2 Glob SerPl Elph-Mcnc: 0.7 g/dL (ref 0.4–1.0)
B-Globulin SerPl Elph-Mcnc: 0.9 g/dL (ref 0.7–1.3)
Gamma Glob SerPl Elph-Mcnc: 0.3 g/dL — ABNORMAL LOW (ref 0.4–1.8)
Globulin, Total: 2.2 g/dL (ref 2.2–3.9)
IgA: <5 mg/dL — ABNORMAL LOW (ref 64–422)
IgG (Immunoglobin G), Serum: 347 mg/dL — ABNORMAL LOW (ref 586–1602)
IgM (Immunoglobulin M), Srm: <5 mg/dL — ABNORMAL LOW (ref 26–217)
M Protein SerPl Elph-Mcnc: 0.1 g/dL — ABNORMAL HIGH
Total Protein ELP: 5.9 g/dL — ABNORMAL LOW (ref 6.0–8.5)

## 2023-10-15 ENCOUNTER — Inpatient Hospital Stay: Payer: Medicare HMO

## 2023-10-15 VITALS — BP 141/65 | HR 80 | Temp 98.8°F | Wt 139.1 lb

## 2023-10-15 DIAGNOSIS — C9000 Multiple myeloma not having achieved remission: Secondary | ICD-10-CM

## 2023-10-15 DIAGNOSIS — Z5112 Encounter for antineoplastic immunotherapy: Secondary | ICD-10-CM | POA: Diagnosis not present

## 2023-10-15 LAB — CBC WITH DIFFERENTIAL (CANCER CENTER ONLY)
Abs Immature Granulocytes: 0.01 10*3/uL (ref 0.00–0.07)
Basophils Absolute: 0.1 10*3/uL (ref 0.0–0.1)
Basophils Relative: 1 %
Eosinophils Absolute: 0.1 10*3/uL (ref 0.0–0.5)
Eosinophils Relative: 2 %
HCT: 41.9 % (ref 36.0–46.0)
Hemoglobin: 13.7 g/dL (ref 12.0–15.0)
Immature Granulocytes: 0 %
Lymphocytes Relative: 39 %
Lymphs Abs: 2.1 10*3/uL (ref 0.7–4.0)
MCH: 30.4 pg (ref 26.0–34.0)
MCHC: 32.7 g/dL (ref 30.0–36.0)
MCV: 93.1 fL (ref 80.0–100.0)
Monocytes Absolute: 0.3 10*3/uL (ref 0.1–1.0)
Monocytes Relative: 6 %
Neutro Abs: 2.8 10*3/uL (ref 1.7–7.7)
Neutrophils Relative %: 52 %
Platelet Count: 171 10*3/uL (ref 150–400)
RBC: 4.5 MIL/uL (ref 3.87–5.11)
RDW: 13.7 % (ref 11.5–15.5)
WBC Count: 5.4 10*3/uL (ref 4.0–10.5)
nRBC: 0 % (ref 0.0–0.2)

## 2023-10-15 LAB — CMP (CANCER CENTER ONLY)
ALT: 21 U/L (ref 0–44)
AST: 18 U/L (ref 15–41)
Albumin: 3.9 g/dL (ref 3.5–5.0)
Alkaline Phosphatase: 105 U/L (ref 38–126)
Anion gap: 4 — ABNORMAL LOW (ref 5–15)
BUN: 25 mg/dL — ABNORMAL HIGH (ref 8–23)
CO2: 31 mmol/L (ref 22–32)
Calcium: 8.8 mg/dL — ABNORMAL LOW (ref 8.9–10.3)
Chloride: 106 mmol/L (ref 98–111)
Creatinine: 0.74 mg/dL (ref 0.44–1.00)
GFR, Estimated: >60 mL/min (ref >60)
Glucose, Bld: 139 mg/dL — ABNORMAL HIGH (ref 70–99)
Potassium: 4.2 mmol/L (ref 3.5–5.1)
Sodium: 141 mmol/L (ref 135–145)
Total Bilirubin: 0.8 mg/dL (ref 0.0–1.2)
Total Protein: 5.8 g/dL — ABNORMAL LOW (ref 6.5–8.1)

## 2023-10-15 MED ORDER — LORATADINE 10 MG PO TABS
10.0000 mg | ORAL_TABLET | Freq: Every day | ORAL | Status: DC
  Administered 2023-10-15: 10 mg via ORAL
  Filled 2023-10-15: qty 1

## 2023-10-15 MED ORDER — ACETAMINOPHEN 325 MG PO TABS
650.0000 mg | ORAL_TABLET | Freq: Once | ORAL | Status: AC
  Administered 2023-10-15: 650 mg via ORAL
  Filled 2023-10-15: qty 2

## 2023-10-15 MED ORDER — DARATUMUMAB-HYALURONIDASE-FIHJ 1800-30000 MG-UT/15ML ~~LOC~~ SOLN
1800.0000 mg | Freq: Once | SUBCUTANEOUS | Status: AC
  Administered 2023-10-15: 1800 mg via SUBCUTANEOUS
  Filled 2023-10-15: qty 15

## 2023-10-15 NOTE — Progress Notes (Signed)
Patient took her steroid at 12:30 today.

## 2023-10-19 ENCOUNTER — Other Ambulatory Visit: Payer: Self-pay | Admitting: Hematology and Oncology

## 2023-10-19 ENCOUNTER — Other Ambulatory Visit: Payer: Self-pay | Admitting: *Deleted

## 2023-10-19 DIAGNOSIS — C9000 Multiple myeloma not having achieved remission: Secondary | ICD-10-CM

## 2023-10-19 MED ORDER — LENALIDOMIDE 10 MG PO CAPS: 10.0000 mg | ORAL_CAPSULE | Freq: Every day | ORAL | 0 refills | Status: DC

## 2023-10-21 NOTE — Progress Notes (Signed)
 Patient Care Team: Shayne Anes, MD as PCP - General (Internal Medicine)   CHIEF COMPLAINT: Follow up multiple myeloma   CURRENT THERAPY: Dara/Rev/Dex  INTERVAL HISTORY Brooke Whitaker returns for follow up and treatment as scheduled. Last seen by Dr. Federico 10/08/23 with C2D8 Dara/Rev/Dex. She had a bad sore throat in the interim but has recovered with only mild post nasal drip. Denies cough or fever. Tolerating treatment.  Towards the end of the Revlimid  cycle she may develop a band of red skin over the upper abdomen, without rash, almost like a sunburn but is not there now.  She is currently on the second week of Revlimid  and will take her last pill on Sunday.  Has not had a stye or eye issues in over a week.  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;  Service: Urology;  Laterality: Left;   DILATION AND EVACUATION  1995   EPISIOTOMY REPAIR, SEVERE TEAR  1989   LAPAROSCOPIC OVARIAN CYSTECTOMY  10/ 2000   NM MYOCAR PERF WALL MOTION  12/15/2009   Normal   US  ECHOCARDIOGRAPHY  12/15/2009   Normal study for age: mild MR,TR and trace PI     Outpatient Encounter Medications as of 10/22/2023  Medication Sig   acetaminophen  (TYLENOL ) 500 MG tablet Take 500 mg by mouth every 6 (six) hours as needed for mild pain or headache.    acyclovir   (ZOVIRAX ) 400 MG tablet Take 1 tablet (400 mg total) by mouth 2 (two) times daily.   allopurinol  (ZYLOPRIM ) 300 MG tablet TAKE 1 TABLET BY MOUTH EVERY DAY   aspirin EC 81 MG tablet Take 81 mg by mouth daily. Swallow whole.   cetirizine (ZYRTEC) 5 MG tablet Take 5 mg by mouth daily as needed for allergies.   cholecalciferol (VITAMIN D ) 1000 UNITS tablet Take 1,000 Units by mouth every morning.    Cyanocobalamin  (VITAMIN B 12 PO) Take 1,000 mcg by mouth daily.   Daratumumab -Hyaluronidase -fihj (DARZALEX  FASPRO [place]) Inject into the skin once a week. Every Monday   dexamethasone  (DECADRON ) 4 MG tablet Take 10 tablets (40 mg total) by mouth once a week. Take once weekly on the day you receive the chemotherapy injection.   doxycycline (VIBRAMYCIN) 50 MG capsule Take 50 mg by mouth daily.   ELDERBERRY PO Take 1 tablet by mouth daily.   fluticasone (FLONASE) 50 MCG/ACT nasal spray Place into the nose as needed.   lenalidomide  (REVLIMID ) 10 MG capsule Take 1 capsule (10 mg total) by mouth daily. [employer] Auth # [id number]  Date Obtained 10/19/23 Take 1 capsule by mouth daily for 14 days then none for 7 days   ondansetron  (ZOFRAN ) 8 MG tablet Take 1 tablet (8 mg total) by mouth every 8 (eight) hours as needed.   prochlorperazine  (COMPAZINE ) 10 MG  tablet Take 1 tablet (10 mg total) by mouth every 6 (six) hours as needed for nausea or vomiting.   rosuvastatin (CRESTOR) 5 MG tablet Take 5 mg by mouth daily.   valACYclovir  (VALTREX ) 500 MG tablet TAKE 4 TABLETS BY MOUTH EVERY 12 HOURS X 1 DAY FOR FEVER BLISTER AS NEEDED.   No facility-administered encounter medications on file as of 10/22/2023.     Today's Vitals   10/22/23 1057 10/22/23 1102  BP: (!) 131/58   Pulse: 77   Resp: 17   Temp: (!) 97.1 F (36.2 C)   TempSrc: Temporal   SpO2: 99%   Weight: 139 lb 11.2 oz (63.4 kg)   Height: 5' 1 (1.549 m)   PainSc:  0-No pain   Body mass index is 26.4 kg/m.   PHYSICAL EXAM GENERAL:alert, no distress and  comfortable EYES: sclera clear LUNGS:  normal breathing effort ABDOMEN: no erythema or rash  NEURO: alert & oriented x 3 with fluent speech, no focal motor deficits   CBC    Component Value Date/Time   WBC 5.5 10/22/2023 1040   WBC 4.0 06/01/2023 1010   RBC 4.42 10/22/2023 1040   HGB 14.0 10/22/2023 1040   HGB 12.3 01/13/2014 1656   HCT 41.2 10/22/2023 1040   PLT 176 10/22/2023 1040   MCV 93.2 10/22/2023 1040   MCH 31.7 10/22/2023 1040   MCHC 34.0 10/22/2023 1040   RDW 14.3 10/22/2023 1040   LYMPHSABS 2.0 10/22/2023 1040   MONOABS 0.3 10/22/2023 1040   EOSABS 0.3 10/22/2023 1040   BASOSABS 0.1 10/22/2023 1040     CMP     Component Value Date/Time   NA 143 10/22/2023 1040   K 3.7 10/22/2023 1040   CL 106 10/22/2023 1040   CO2 33 (H) 10/22/2023 1040   GLUCOSE 89 10/22/2023 1040   BUN 14 10/22/2023 1040   CREATININE 0.65 10/22/2023 1040   CREATININE 0.67 12/08/2014 0851   CALCIUM 9.0 10/22/2023 1040   PROT 5.7 (L) 10/22/2023 1040   ALBUMIN 3.9 10/22/2023 1040   AST 17 10/22/2023 1040   ALT 23 10/22/2023 1040   ALKPHOS 96 10/22/2023 1040   BILITOT 1.3 (H) 10/22/2023 1040   GFRNONAA >60 10/22/2023 1040   GFRAA 73 (L) 04/12/2014 1659     ASSESSMENT & PLAN: 73 yo female    Free Lambda Multiple Myeloma 11/10/2022: WBC 5.68, Hgb 11.7, MCV 97.4, Plt 146 04/17/2023: WBC 4.53, Hgb 11.5, MCV 107.6, Plt 110 05/16/2023: establish care with Dr. Federico. Labs showed M protein 0.1 with Lambda 900, Kappa 3 with ratio 0.00.   06/01/2023: bone marrow biopsy performed, showed plasma cell myeloma nearly effacing approximately 80% of the cellular marrow.  -UPEP + Bence Jones proteins, bone met survey negative  06/25/2023: Cycle 1 Day 1 of VRD chemotherapy 07/23/2023: Cycle 2 Day 1 of VRD chemotherapy 08/13/2023: Cycle 3 Day 1 of VRD chemotherapy -Developed rash from Revlimid  (improved on lower dose) and ocular toxicity from Velcade  09/03/2023: Cycle 1 Day 1 of Dara/Rev/Dex 09/10/2023 C1  Day 8 Dara/Rev/Dex 09/17/2023: Cycle 1 day 15 Dara/rev/Dex; lambda light chains improved 09/24/2023: Cycle 1 day 22 Dara/rev/Dex; no rash at the current dose of Revlimid  10/01/2023 Cycle 2 Day 1 Dara/Rev/Dex 10/08/2023 Cycle 2 Day 8 Dara/Rev/Dex 10/15/2023 Cycle 2 Day 15 Dara/Rev/Dex 10/22/2023 Cycle 2 day 22 Dara/Rev/Dex, no rash or ocular tox    Dispo: Brooke Whitaker appears stable. She continues Dara/Rev/Dex, tolerating well without significant toxicities. Last MM panel had improved. Today's labs  are adequate for C2 D 22 Dara/Rev/Dex. She will return next week for C3 D1 then change to Dara q2 weeks. Continue Revlimid  2 weeks on/1 week off. F/up in 2 weeks.    All questions were answered. The patient knows to call the clinic with any problems, questions or concerns. No barriers to learning were detected.   Icey Tello, NP-C 10/22/2023

## 2023-10-22 ENCOUNTER — Inpatient Hospital Stay: Payer: HMO | Attending: Hematology and Oncology

## 2023-10-22 ENCOUNTER — Encounter: Payer: Self-pay | Admitting: Hematology and Oncology

## 2023-10-22 ENCOUNTER — Inpatient Hospital Stay (HOSPITAL_BASED_OUTPATIENT_CLINIC_OR_DEPARTMENT_OTHER): Payer: HMO | Admitting: Nurse Practitioner

## 2023-10-22 ENCOUNTER — Inpatient Hospital Stay: Payer: HMO

## 2023-10-22 ENCOUNTER — Inpatient Hospital Stay: Payer: HMO | Admitting: Hematology and Oncology

## 2023-10-22 ENCOUNTER — Encounter: Payer: Self-pay | Admitting: Nurse Practitioner

## 2023-10-22 VITALS — BP 131/58 | HR 77 | Temp 97.1°F | Resp 17 | Ht 61.0 in | Wt 139.7 lb

## 2023-10-22 DIAGNOSIS — C9000 Multiple myeloma not having achieved remission: Secondary | ICD-10-CM

## 2023-10-22 DIAGNOSIS — Z5112 Encounter for antineoplastic immunotherapy: Secondary | ICD-10-CM | POA: Insufficient documentation

## 2023-10-22 DIAGNOSIS — Z79899 Other long term (current) drug therapy: Secondary | ICD-10-CM | POA: Diagnosis not present

## 2023-10-22 LAB — CBC WITH DIFFERENTIAL (CANCER CENTER ONLY)
Abs Immature Granulocytes: 0.01 10*3/uL (ref 0.00–0.07)
Basophils Absolute: 0.1 10*3/uL (ref 0.0–0.1)
Basophils Relative: 1 %
Eosinophils Absolute: 0.3 10*3/uL (ref 0.0–0.5)
Eosinophils Relative: 5 %
HCT: 41.2 % (ref 36.0–46.0)
Hemoglobin: 14 g/dL (ref 12.0–15.0)
Immature Granulocytes: 0 %
Lymphocytes Relative: 37 %
Lymphs Abs: 2 10*3/uL (ref 0.7–4.0)
MCH: 31.7 pg (ref 26.0–34.0)
MCHC: 34 g/dL (ref 30.0–36.0)
MCV: 93.2 fL (ref 80.0–100.0)
Monocytes Absolute: 0.3 10*3/uL (ref 0.1–1.0)
Monocytes Relative: 6 %
Neutro Abs: 2.8 10*3/uL (ref 1.7–7.7)
Neutrophils Relative %: 51 %
Platelet Count: 176 10*3/uL (ref 150–400)
RBC: 4.42 MIL/uL (ref 3.87–5.11)
RDW: 14.3 % (ref 11.5–15.5)
WBC Count: 5.5 10*3/uL (ref 4.0–10.5)
nRBC: 0 % (ref 0.0–0.2)

## 2023-10-22 LAB — CMP (CANCER CENTER ONLY)
ALT: 23 U/L (ref 0–44)
AST: 17 U/L (ref 15–41)
Albumin: 3.9 g/dL (ref 3.5–5.0)
Alkaline Phosphatase: 96 U/L (ref 38–126)
Anion gap: 4 — ABNORMAL LOW (ref 5–15)
BUN: 14 mg/dL (ref 8–23)
CO2: 33 mmol/L — ABNORMAL HIGH (ref 22–32)
Calcium: 9 mg/dL (ref 8.9–10.3)
Chloride: 106 mmol/L (ref 98–111)
Creatinine: 0.65 mg/dL (ref 0.44–1.00)
GFR, Estimated: >60 mL/min (ref >60)
Glucose, Bld: 89 mg/dL (ref 70–99)
Potassium: 3.7 mmol/L (ref 3.5–5.1)
Sodium: 143 mmol/L (ref 135–145)
Total Bilirubin: 1.3 mg/dL — ABNORMAL HIGH (ref 0.0–1.2)
Total Protein: 5.7 g/dL — ABNORMAL LOW (ref 6.5–8.1)

## 2023-10-22 MED ORDER — DARATUMUMAB-HYALURONIDASE-FIHJ 1800-30000 MG-UT/15ML ~~LOC~~ SOLN
1800.0000 mg | Freq: Once | SUBCUTANEOUS | Status: AC
  Administered 2023-10-22: 1800 mg via SUBCUTANEOUS
  Filled 2023-10-22: qty 15

## 2023-10-22 MED ORDER — LORATADINE 10 MG PO TABS
10.0000 mg | ORAL_TABLET | Freq: Every day | ORAL | Status: DC
  Administered 2023-10-22: 10 mg via ORAL
  Filled 2023-10-22: qty 1

## 2023-10-22 MED ORDER — ACETAMINOPHEN 325 MG PO TABS
650.0000 mg | ORAL_TABLET | Freq: Once | ORAL | Status: AC
  Administered 2023-10-22: 650 mg via ORAL
  Filled 2023-10-22: qty 2

## 2023-10-22 NOTE — Patient Instructions (Signed)

## 2023-10-22 NOTE — Progress Notes (Signed)
 Patient took steroid at 0930 this morning.

## 2023-10-29 ENCOUNTER — Other Ambulatory Visit (HOSPITAL_COMMUNITY): Payer: Self-pay

## 2023-10-29 ENCOUNTER — Inpatient Hospital Stay: Payer: HMO

## 2023-10-29 ENCOUNTER — Encounter: Payer: Self-pay | Admitting: Hematology and Oncology

## 2023-10-29 ENCOUNTER — Other Ambulatory Visit: Payer: Self-pay

## 2023-10-29 ENCOUNTER — Other Ambulatory Visit: Payer: Medicare HMO

## 2023-10-29 ENCOUNTER — Ambulatory Visit: Payer: Medicare HMO | Admitting: Nurse Practitioner

## 2023-10-29 ENCOUNTER — Telehealth: Payer: Self-pay | Admitting: Pharmacy Technician

## 2023-10-29 VITALS — BP 135/56 | HR 66 | Temp 97.9°F | Resp 18 | Ht 61.0 in | Wt 139.2 lb

## 2023-10-29 DIAGNOSIS — C9000 Multiple myeloma not having achieved remission: Secondary | ICD-10-CM

## 2023-10-29 DIAGNOSIS — Z5112 Encounter for antineoplastic immunotherapy: Secondary | ICD-10-CM | POA: Diagnosis not present

## 2023-10-29 LAB — CBC WITH DIFFERENTIAL (CANCER CENTER ONLY)
Abs Immature Granulocytes: 0.01 10*3/uL (ref 0.00–0.07)
Basophils Absolute: 0.1 10*3/uL (ref 0.0–0.1)
Basophils Relative: 1 %
Eosinophils Absolute: 0.1 10*3/uL (ref 0.0–0.5)
Eosinophils Relative: 1 %
HCT: 42 % (ref 36.0–46.0)
Hemoglobin: 14.1 g/dL (ref 12.0–15.0)
Immature Granulocytes: 0 %
Lymphocytes Relative: 21 %
Lymphs Abs: 1.3 10*3/uL (ref 0.7–4.0)
MCH: 30.9 pg (ref 26.0–34.0)
MCHC: 33.6 g/dL (ref 30.0–36.0)
MCV: 91.9 fL (ref 80.0–100.0)
Monocytes Absolute: 0.3 10*3/uL (ref 0.1–1.0)
Monocytes Relative: 4 %
Neutro Abs: 4.5 10*3/uL (ref 1.7–7.7)
Neutrophils Relative %: 73 %
Platelet Count: 150 10*3/uL (ref 150–400)
RBC: 4.57 MIL/uL (ref 3.87–5.11)
RDW: 14.1 % (ref 11.5–15.5)
WBC Count: 6.1 10*3/uL (ref 4.0–10.5)
nRBC: 0 % (ref 0.0–0.2)

## 2023-10-29 LAB — CMP (CANCER CENTER ONLY)
ALT: 20 U/L (ref 0–44)
AST: 17 U/L (ref 15–41)
Albumin: 4 g/dL (ref 3.5–5.0)
Alkaline Phosphatase: 89 U/L (ref 38–126)
Anion gap: 5 (ref 5–15)
BUN: 14 mg/dL (ref 8–23)
CO2: 29 mmol/L (ref 22–32)
Calcium: 8.9 mg/dL (ref 8.9–10.3)
Chloride: 105 mmol/L (ref 98–111)
Creatinine: 0.59 mg/dL (ref 0.44–1.00)
GFR, Estimated: >60 mL/min (ref >60)
Glucose, Bld: 96 mg/dL (ref 70–99)
Potassium: 3.8 mmol/L (ref 3.5–5.1)
Sodium: 139 mmol/L (ref 135–145)
Total Bilirubin: 1.4 mg/dL — ABNORMAL HIGH (ref 0.0–1.2)
Total Protein: 5.8 g/dL — ABNORMAL LOW (ref 6.5–8.1)

## 2023-10-29 MED ORDER — LORATADINE 10 MG PO TABS
10.0000 mg | ORAL_TABLET | Freq: Once | ORAL | Status: AC
Start: 2023-10-29 — End: 2023-10-29
  Administered 2023-10-29: 10 mg via ORAL
  Filled 2023-10-29: qty 1

## 2023-10-29 MED ORDER — ACETAMINOPHEN 325 MG PO TABS
650.0000 mg | ORAL_TABLET | Freq: Once | ORAL | Status: AC
  Administered 2023-10-29: 650 mg via ORAL
  Filled 2023-10-29: qty 2

## 2023-10-29 MED ORDER — DARATUMUMAB-HYALURONIDASE-FIHJ 1800-30000 MG-UT/15ML ~~LOC~~ SOLN
1800.0000 mg | Freq: Once | SUBCUTANEOUS | Status: AC
  Administered 2023-10-29: 1800 mg via SUBCUTANEOUS
  Filled 2023-10-29: qty 15

## 2023-10-29 NOTE — Telephone Encounter (Signed)
 Oral Oncology Patient Advocate Encounter  Prior Authorization for lenalidomide  has been approved.    PA# [id number] Effective dates: 10/29/23 through 10/28/24  Patients co-pay is $2,000.    Estefana Moellers, CPhT-Adv Oncology Pharmacy Patient Advocate [facility] Direct Number: [phone]  Fax: [phone]

## 2023-10-29 NOTE — Telephone Encounter (Signed)
 Oral Oncology Patient Advocate Encounter   Received notification that prior authorization for lenalidomide  is required.   PA submitted on 10/29/23 Key [id number] Status is pending     Estefana Moellers, CPhT-Adv Oncology Pharmacy Patient Advocate [facility] Direct Number: [phone]  Fax: [phone]

## 2023-10-29 NOTE — Progress Notes (Signed)
 Pt states that she took dex at home this morning

## 2023-10-29 NOTE — Patient Instructions (Signed)

## 2023-10-30 ENCOUNTER — Encounter: Payer: Self-pay | Admitting: Hematology and Oncology

## 2023-10-30 LAB — KAPPA/LAMBDA LIGHT CHAINS
Kappa free light chain: 3.4 mg/L (ref 3.3–19.4)
Kappa, lambda light chain ratio: 0.05 — ABNORMAL LOW (ref 0.26–1.65)
Lambda free light chains: 63 mg/L — ABNORMAL HIGH (ref 5.7–26.3)

## 2023-11-02 ENCOUNTER — Encounter: Payer: Self-pay | Admitting: Hematology and Oncology

## 2023-11-05 LAB — MULTIPLE MYELOMA PANEL, SERUM
Albumin SerPl Elph-Mcnc: 3.5 g/dL (ref 2.9–4.4)
Albumin/Glob SerPl: 2 — ABNORMAL HIGH (ref 0.7–1.7)
Alpha 1: 0.2 g/dL (ref 0.0–0.4)
Alpha2 Glob SerPl Elph-Mcnc: 0.6 g/dL (ref 0.4–1.0)
B-Globulin SerPl Elph-Mcnc: 0.8 g/dL (ref 0.7–1.3)
Gamma Glob SerPl Elph-Mcnc: 0.2 g/dL — ABNORMAL LOW (ref 0.4–1.8)
Globulin, Total: 1.8 g/dL — ABNORMAL LOW (ref 2.2–3.9)
IgA: <5 mg/dL — ABNORMAL LOW (ref 64–422)
IgG (Immunoglobin G), Serum: 338 mg/dL — ABNORMAL LOW (ref 586–1602)
IgM (Immunoglobulin M), Srm: <5 mg/dL — ABNORMAL LOW (ref 26–217)
M Protein SerPl Elph-Mcnc: 0.1 g/dL — ABNORMAL HIGH
Total Protein ELP: 5.3 g/dL — ABNORMAL LOW (ref 6.0–8.5)

## 2023-11-11 NOTE — Progress Notes (Unsigned)
Patient Care Team: Rodrigo Ran, MD as PCP - General (Internal Medicine)   CHIEF COMPLAINT: Follow-up multiple myeloma  CURRENT THERAPY: Dara/rev/Dex  INTERVAL HISTORY Ms. Cinelli returns for follow-up and treatment as scheduled, last seen by me 10/22/2023.  She continues Dara/rev/Dex, currently on Revlimid.  She enjoyed the week off Dara and steroids last week.  She gets a headache and loses her voice a few days after taking Dex.  Noticed a slight intermittent tremor in her hands since the holidays.  No other neurological changes.  Manages constipation with Senokot almost daily.  She has had some arthritic pain in the right hip in the past which is flaring currently.  Denies any other new or worsening pain.  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    Sigmoid diverticulosis    Wears glasses      Past Surgical History:  Procedure Laterality Date   BREAST BIOPSY Right 09/2011   benign    COLONOSCOPY  2010   CYSTOSCOPY/RETROGRADE/URETEROSCOPY/STONE EXTRACTION WITH BASKET Left 04/21/2014   Procedure: cystoscopy, left retrograde pylegram, LEFT URETEROSCOPY, laser lithotripsy with holmium laser, STONE EXTRACTION, uretheral calibration;  Surgeon: Anner Crete, MD;  Location: [facility];  Service: Urology;  Laterality: Left;   DILATION AND EVACUATION  1995   EPISIOTOMY REPAIR, SEVERE TEAR  1989   LAPAROSCOPIC OVARIAN CYSTECTOMY  10/ 2000   NM MYOCAR PERF WALL MOTION  12/15/2009   Normal   US ECHOCARDIOGRAPHY  12/15/2009   Normal study for age: mild MR,TR and trace PI     Outpatient Encounter Medications as of 11/12/2023  Medication Sig   acetaminophen (TYLENOL) 500 MG tablet Take 500 mg by mouth every 6 (six) hours as needed for mild pain or headache.    acyclovir  (ZOVIRAX) 400 MG tablet Take 1 tablet (400 mg total) by mouth 2 (two) times daily.   allopurinol (ZYLOPRIM) 300 MG tablet TAKE 1 TABLET BY MOUTH EVERY DAY   aspirin EC 81 MG tablet Take 81 mg by mouth daily. Swallow whole.   cetirizine (ZYRTEC) 5 MG tablet Take 5 mg by mouth daily as needed for allergies.   cholecalciferol (VITAMIN D) 1000 UNITS tablet Take 1,000 Units by mouth every morning.    Cyanocobalamin (VITAMIN B 12 PO) Take 1,000 mcg by mouth daily.   Daratumumab-Hyaluronidase-fihj (DARZALEX FASPRO [place]) Inject into the skin once a week. Every Monday   dexamethasone (DECADRON) 4 MG tablet Take 10 tablets (40 mg total) by mouth once a week. Take once weekly on the day you receive the chemotherapy injection.   doxycycline (VIBRAMYCIN) 50 MG capsule Take 50 mg by mouth daily.   ELDERBERRY PO Take 1 tablet by mouth daily.   fluticasone (FLONASE) 50 MCG/ACT nasal spray Place into the nose as needed.   lenalidomide (REVLIMID) 10 MG capsule Take 1 capsule (10 mg total) by mouth daily. [employer] Auth # [id number]  Date Obtained 10/19/23 Take 1 capsule by mouth daily for 14 days then none for 7 days   ondansetron (ZOFRAN) 8 MG tablet Take 1 tablet (8 mg total) by mouth every 8 (eight) hours as needed.   prochlorperazine (COMPAZINE) 10 MG tablet Take 1 tablet (10 mg total) by mouth every  6 (six) hours as needed for nausea or vomiting.   rosuvastatin (CRESTOR) 5 MG tablet Take 5 mg by mouth daily.   valACYclovir (VALTREX) 500 MG tablet TAKE 4 TABLETS BY MOUTH EVERY 12 HOURS X 1 DAY FOR FEVER BLISTER AS NEEDED.   No facility-administered encounter medications on file as of 11/12/2023.     Today's Vitals   11/12/23 1148  BP: (!) 118/45  Pulse: 71  Resp: 17  Temp: 98.1 F (36.7 C)  TempSrc: Temporal  SpO2: 98%  Weight: 142 lb 6.4 oz (64.6 kg)  Height: 5\' 1"  (1.549 m)  PainSc: 0-No pain   Body mass index is 26.91 kg/m.   PHYSICAL EXAM GENERAL:alert, no distress and comfortable SKIN: no rash   EYES: sclera clear LUNGS: clear with normal breathing effort HEART: regular rate & rhythm, no lower extremity edema ABDOMEN: abdomen soft, non-tender and normal bowel sounds NEURO: alert & oriented x 3 with fluent speech, no focal motor/sensory deficits  CBC    Component Value Date/Time   WBC 4.4 11/12/2023 1053   WBC 4.0 06/01/2023 1010   RBC 4.52 11/12/2023 1053   HGB 14.0 11/12/2023 1053   HGB 12.3 01/13/2014 1656   HCT 42.8 11/12/2023 1053   PLT 153 11/12/2023 1053   MCV 94.7 11/12/2023 1053   MCH 31.0 11/12/2023 1053   MCHC 32.7 11/12/2023 1053   RDW 14.5 11/12/2023 1053   LYMPHSABS 2.2 11/12/2023 1053   MONOABS 0.2 11/12/2023 1053   EOSABS 0.2 11/12/2023 1053   BASOSABS 0.1 11/12/2023 1053     CMP     Component Value Date/Time   NA 142 11/12/2023 1053   K 3.5 11/12/2023 1053   CL 107 11/12/2023 1053   CO2 31 11/12/2023 1053   GLUCOSE 89 11/12/2023 1053   BUN 14 11/12/2023 1053   CREATININE 0.68 11/12/2023 1053   CREATININE 0.67 12/08/2014 0851   CALCIUM 8.8 (L) 11/12/2023 1053   PROT 5.6 (L) 11/12/2023 1053   ALBUMIN 3.9 11/12/2023 1053   AST 19 11/12/2023 1053   ALT 19 11/12/2023 1053   ALKPHOS 82 11/12/2023 1053   BILITOT 1.7 (H) 11/12/2023 1053   GFRNONAA >60 11/12/2023 1053   GFRAA 73 (L) 04/12/2014 1659     ASSESSMENT & PLAN: 73 yo female   Free Lambda Multiple Myeloma 11/10/2022: WBC 5.68, Hgb 11.7, MCV 97.4, Plt 146 04/17/2023: WBC 4.53, Hgb 11.5, MCV 107.6, Plt 110 05/16/2023: establish care with Dr. Leonides Schanz. Labs showed M protein 0.1 with Lambda 900, Kappa 3 with ratio 0.00.   06/01/2023: bone marrow biopsy performed, showed plasma cell myeloma nearly effacing approximately 80% of the cellular marrow.  -UPEP + Bence Jones proteins, bone met survey negative  06/25/2023: Cycle 1 Day 1 of VRD chemotherapy 07/23/2023: Cycle 2 Day 1 of VRD chemotherapy 08/13/2023: Cycle 3 Day 1 of VRD chemotherapy -Developed rash from Revlimid (improved on lower dose) and  ocular toxicity from Velcade 09/03/2023: Cycle 1 Day 1 of Dara/Rev/Dex 09/10/2023 C1 Day 8 Dara/Rev/Dex 09/17/2023: Cycle 1 day 15 Dara/rev/Dex; lambda light chains improved 09/24/2023: Cycle 1 day 22 Dara/rev/Dex; no rash at the current dose of Revlimid 10/01/2023 Cycle 2 Day 1 Dara/Rev/Dex 10/08/2023 Cycle 2 Day 8 Dara/Rev/Dex 10/15/2023 Cycle 2 Day 15 Dara/Rev/Dex 10/22/2023 Cycle 2 day 22 Dara/Rev/Dex, no rash or ocular tox 10/29/23 cycle 3 day 1 Dara/Rev/Dex 11/12/23 cycle 3 day 15 Dara/Rev/Dex   Dispo:  Ms. Sonoda appears stable. Tolerating Dara/Rev/Dex with mild constipation. She is having a R hip  pain flare in the setting of arthritis s/p injections in the past. No other pain or clinical suspicion for disease progression. Labs reviewed. Lambda light chains have improved on therapy and recently stabilized. Continue Revlimid, proceed with C3 D 15 Dara/Dex today as scheduled.   We discussed infection precautions, treatment trajectory, signs of progression, and answered questions about MM in general.   Return for C4 D 1 Dara/Rev/Dex in 2 weeks then f/up prior to C4 D 15 Dara/Rev/Dex in 4 weeks      All questions were answered. The patient knows to call the clinic with any problems, questions or concerns. No barriers to learning were detected. I spent 20 minutes counseling the patient face to face. The total time spent in the appointment was 30 minutes and more than 50% was on counseling, review of test results, and coordination of care.   Santiago Glad, NP-C 11/12/2023

## 2023-11-12 ENCOUNTER — Encounter: Payer: Self-pay | Admitting: Nurse Practitioner

## 2023-11-12 ENCOUNTER — Inpatient Hospital Stay: Payer: HMO

## 2023-11-12 ENCOUNTER — Inpatient Hospital Stay (HOSPITAL_BASED_OUTPATIENT_CLINIC_OR_DEPARTMENT_OTHER): Payer: HMO | Admitting: Nurse Practitioner

## 2023-11-12 VITALS — BP 118/45 | HR 71 | Temp 98.1°F | Resp 17 | Ht 61.0 in | Wt 142.4 lb

## 2023-11-12 DIAGNOSIS — C9000 Multiple myeloma not having achieved remission: Secondary | ICD-10-CM

## 2023-11-12 DIAGNOSIS — Z5112 Encounter for antineoplastic immunotherapy: Secondary | ICD-10-CM | POA: Diagnosis not present

## 2023-11-12 LAB — CMP (CANCER CENTER ONLY)
ALT: 19 U/L (ref 0–44)
AST: 19 U/L (ref 15–41)
Albumin: 3.9 g/dL (ref 3.5–5.0)
Alkaline Phosphatase: 82 U/L (ref 38–126)
Anion gap: 4 — ABNORMAL LOW (ref 5–15)
BUN: 14 mg/dL (ref 8–23)
CO2: 31 mmol/L (ref 22–32)
Calcium: 8.8 mg/dL — ABNORMAL LOW (ref 8.9–10.3)
Chloride: 107 mmol/L (ref 98–111)
Creatinine: 0.68 mg/dL (ref 0.44–1.00)
GFR, Estimated: >60 mL/min (ref >60)
Glucose, Bld: 89 mg/dL (ref 70–99)
Potassium: 3.5 mmol/L (ref 3.5–5.1)
Sodium: 142 mmol/L (ref 135–145)
Total Bilirubin: 1.7 mg/dL — ABNORMAL HIGH (ref 0.0–1.2)
Total Protein: 5.6 g/dL — ABNORMAL LOW (ref 6.5–8.1)

## 2023-11-12 LAB — CBC WITH DIFFERENTIAL (CANCER CENTER ONLY)
Abs Immature Granulocytes: 0 10*3/uL (ref 0.00–0.07)
Basophils Absolute: 0.1 10*3/uL (ref 0.0–0.1)
Basophils Relative: 1 %
Eosinophils Absolute: 0.2 10*3/uL (ref 0.0–0.5)
Eosinophils Relative: 5 %
HCT: 42.8 % (ref 36.0–46.0)
Hemoglobin: 14 g/dL (ref 12.0–15.0)
Immature Granulocytes: 0 %
Lymphocytes Relative: 50 %
Lymphs Abs: 2.2 10*3/uL (ref 0.7–4.0)
MCH: 31 pg (ref 26.0–34.0)
MCHC: 32.7 g/dL (ref 30.0–36.0)
MCV: 94.7 fL (ref 80.0–100.0)
Monocytes Absolute: 0.2 10*3/uL (ref 0.1–1.0)
Monocytes Relative: 4 %
Neutro Abs: 1.7 10*3/uL (ref 1.7–7.7)
Neutrophils Relative %: 40 %
Platelet Count: 153 10*3/uL (ref 150–400)
RBC: 4.52 MIL/uL (ref 3.87–5.11)
RDW: 14.5 % (ref 11.5–15.5)
WBC Count: 4.4 10*3/uL (ref 4.0–10.5)
nRBC: 0 % (ref 0.0–0.2)

## 2023-11-12 MED ORDER — ACETAMINOPHEN 325 MG PO TABS
650.0000 mg | ORAL_TABLET | Freq: Once | ORAL | Status: AC
  Administered 2023-11-12: 650 mg via ORAL
  Filled 2023-11-12: qty 2

## 2023-11-12 MED ORDER — LORATADINE 10 MG PO TABS
10.0000 mg | ORAL_TABLET | Freq: Every day | ORAL | Status: DC
  Administered 2023-11-12: 10 mg via ORAL
  Filled 2023-11-12: qty 1

## 2023-11-12 MED ORDER — DARATUMUMAB-HYALURONIDASE-FIHJ 1800-30000 MG-UT/15ML ~~LOC~~ SOLN
1800.0000 mg | Freq: Once | SUBCUTANEOUS | Status: AC
  Administered 2023-11-12: 1800 mg via SUBCUTANEOUS
  Filled 2023-11-12: qty 15

## 2023-11-12 NOTE — Patient Instructions (Signed)
[facility] - A DEPT OF [facility]  Discharge Instructions: Thank you for choosing [facility] to provide your oncology and hematology care.   If you have a lab appointment with the Cancer Center, please go directly to the Cancer Center and check in at the registration area.   Wear comfortable clothing and clothing appropriate for easy access to any Portacath or PICC line.   We strive to give you quality time with your provider. You may need to reschedule your appointment if you arrive late (15 or more minutes).  Arriving late affects you and other patients whose appointments are after yours.  Also, if you miss three or more appointments without notifying the office, you may be dismissed from the clinic at the provider's discretion.      For prescription refill requests, have your pharmacy contact our office and allow 72 hours for refills to be completed.    Today you received the following chemotherapy and/or immunotherapy agents: [name].       To help prevent nausea and vomiting after your treatment, we encourage you to take your nausea medication as directed.  BELOW ARE SYMPTOMS THAT SHOULD BE REPORTED IMMEDIATELY: *FEVER GREATER THAN 100.4 F (38 C) OR HIGHER *CHILLS OR SWEATING *NAUSEA AND VOMITING THAT IS NOT CONTROLLED WITH YOUR NAUSEA MEDICATION *UNUSUAL SHORTNESS OF BREATH *UNUSUAL BRUISING OR BLEEDING *URINARY PROBLEMS (pain or burning when urinating, or frequent urination) *BOWEL PROBLEMS (unusual diarrhea, constipation, pain near the anus) TENDERNESS IN MOUTH AND THROAT WITH OR WITHOUT PRESENCE OF ULCERS (sore throat, sores in mouth, or a toothache) UNUSUAL RASH, SWELLING OR PAIN  UNUSUAL VAGINAL DISCHARGE OR ITCHING   Items with * indicate a potential emergency and should be followed up as soon as possible or go to the Emergency Department if any problems should occur.  Please show the CHEMOTHERAPY ALERT CARD or  IMMUNOTHERAPY ALERT CARD at check-in to the Emergency Department and triage nurse.  Should you have questions after your visit or need to cancel or reschedule your appointment, please contact [facility] - A DEPT OF [facility]  Dept: [phone]  and follow the prompts.  Office hours are 8:00 a.m. to 4:30 p.m. Monday - Friday. Please note that voicemails left after 4:00 p.m. may not be returned until the following business day.  We are closed weekends and major holidays. You have access to a nurse at all times for urgent questions. Please call the main number to the clinic Dept: [phone] and follow the prompts.   For any non-urgent questions, you may also contact your provider using MyChart. We now offer e-Visits for anyone 58 and older to request care online for non-urgent symptoms. For details visit [email or web address].   Also download the MyChart app! Go to the app store, search "MyChart", open the app, select [place], and log in with your MyChart username and password.

## 2023-11-12 NOTE — Progress Notes (Signed)
Pt states she took decadron at home prior to arriving for [facility].

## 2023-11-16 ENCOUNTER — Other Ambulatory Visit: Payer: Self-pay | Admitting: Hematology and Oncology

## 2023-11-16 ENCOUNTER — Other Ambulatory Visit: Payer: Self-pay | Admitting: *Deleted

## 2023-11-16 DIAGNOSIS — C9000 Multiple myeloma not having achieved remission: Secondary | ICD-10-CM

## 2023-11-16 MED ORDER — LENALIDOMIDE 10 MG PO CAPS: 10.0000 mg | ORAL_CAPSULE | Freq: Every day | ORAL | 0 refills | Status: DC

## 2023-11-21 ENCOUNTER — Ambulatory Visit: Payer: Medicare HMO | Admitting: Hematology and Oncology

## 2023-11-21 ENCOUNTER — Other Ambulatory Visit: Payer: Medicare HMO

## 2023-11-26 ENCOUNTER — Inpatient Hospital Stay: Payer: HMO | Attending: Hematology and Oncology

## 2023-11-26 ENCOUNTER — Inpatient Hospital Stay: Payer: HMO

## 2023-11-26 ENCOUNTER — Other Ambulatory Visit: Payer: Self-pay

## 2023-11-26 VITALS — BP 144/67 | HR 75 | Temp 98.2°F | Resp 17 | Wt 141.2 lb

## 2023-11-26 DIAGNOSIS — Z800 Family history of malignant neoplasm of digestive organs: Secondary | ICD-10-CM | POA: Insufficient documentation

## 2023-11-26 DIAGNOSIS — C9000 Multiple myeloma not having achieved remission: Secondary | ICD-10-CM

## 2023-11-26 DIAGNOSIS — Z79620 Long term (current) use of immunosuppressive biologic: Secondary | ICD-10-CM | POA: Insufficient documentation

## 2023-11-26 DIAGNOSIS — Z5112 Encounter for antineoplastic immunotherapy: Secondary | ICD-10-CM | POA: Diagnosis not present

## 2023-11-26 LAB — CBC WITH DIFFERENTIAL (CANCER CENTER ONLY)
Abs Immature Granulocytes: 0.01 10*3/uL (ref 0.00–0.07)
Basophils Absolute: 0.1 10*3/uL (ref 0.0–0.1)
Basophils Relative: 1 %
Eosinophils Absolute: 0 10*3/uL (ref 0.0–0.5)
Eosinophils Relative: 1 %
HCT: 43 % (ref 36.0–46.0)
Hemoglobin: 14.1 g/dL (ref 12.0–15.0)
Immature Granulocytes: 0 %
Lymphocytes Relative: 23 %
Lymphs Abs: 1.3 10*3/uL (ref 0.7–4.0)
MCH: 30.5 pg (ref 26.0–34.0)
MCHC: 32.8 g/dL (ref 30.0–36.0)
MCV: 93.1 fL (ref 80.0–100.0)
Monocytes Absolute: 0.1 10*3/uL (ref 0.1–1.0)
Monocytes Relative: 3 %
Neutro Abs: 4 10*3/uL (ref 1.7–7.7)
Neutrophils Relative %: 72 %
Platelet Count: 122 10*3/uL — ABNORMAL LOW (ref 150–400)
RBC: 4.62 MIL/uL (ref 3.87–5.11)
RDW: 14.5 % (ref 11.5–15.5)
WBC Count: 5.5 10*3/uL (ref 4.0–10.5)
nRBC: 0 % (ref 0.0–0.2)

## 2023-11-26 LAB — CMP (CANCER CENTER ONLY)
ALT: 17 U/L (ref 0–44)
AST: 19 U/L (ref 15–41)
Albumin: 4.1 g/dL (ref 3.5–5.0)
Alkaline Phosphatase: 94 U/L (ref 38–126)
Anion gap: 4 — ABNORMAL LOW (ref 5–15)
BUN: 20 mg/dL (ref 8–23)
CO2: 28 mmol/L (ref 22–32)
Calcium: 9 mg/dL (ref 8.9–10.3)
Chloride: 108 mmol/L (ref 98–111)
Creatinine: 0.63 mg/dL (ref 0.44–1.00)
GFR, Estimated: >60 mL/min (ref >60)
Glucose, Bld: 109 mg/dL — ABNORMAL HIGH (ref 70–99)
Potassium: 3.9 mmol/L (ref 3.5–5.1)
Sodium: 140 mmol/L (ref 135–145)
Total Bilirubin: 1 mg/dL (ref 0.0–1.2)
Total Protein: 5.9 g/dL — ABNORMAL LOW (ref 6.5–8.1)

## 2023-11-26 MED ORDER — ACETAMINOPHEN 325 MG PO TABS
650.0000 mg | ORAL_TABLET | Freq: Once | ORAL | Status: AC
  Administered 2023-11-26: 650 mg via ORAL
  Filled 2023-11-26: qty 2

## 2023-11-26 MED ORDER — DARATUMUMAB-HYALURONIDASE-FIHJ 1800-30000 MG-UT/15ML ~~LOC~~ SOLN
1800.0000 mg | Freq: Once | SUBCUTANEOUS | Status: AC
  Administered 2023-11-26: 1800 mg via SUBCUTANEOUS
  Filled 2023-11-26: qty 15

## 2023-11-26 MED ORDER — LORATADINE 10 MG PO TABS
10.0000 mg | ORAL_TABLET | Freq: Once | ORAL | Status: AC
  Administered 2023-11-26: 10 mg via ORAL
  Filled 2023-11-26: qty 1

## 2023-11-26 NOTE — Progress Notes (Signed)
 Patient states she took dexamethasone  at home today prior to arriving.

## 2023-11-27 ENCOUNTER — Ambulatory Visit: Payer: Medicare HMO | Admitting: Physician Assistant

## 2023-11-27 ENCOUNTER — Other Ambulatory Visit: Payer: Medicare HMO

## 2023-11-27 ENCOUNTER — Ambulatory Visit: Payer: Medicare HMO

## 2023-11-27 LAB — KAPPA/LAMBDA LIGHT CHAINS
Kappa free light chain: 3.2 mg/L — ABNORMAL LOW (ref 3.3–19.4)
Kappa, lambda light chain ratio: 0.04 — ABNORMAL LOW (ref 0.26–1.65)
Lambda free light chains: 79 mg/L — ABNORMAL HIGH (ref 5.7–26.3)

## 2023-11-29 LAB — MULTIPLE MYELOMA PANEL, SERUM
Albumin SerPl Elph-Mcnc: 3.8 g/dL (ref 2.9–4.4)
Albumin/Glob SerPl: 2.1 — ABNORMAL HIGH (ref 0.7–1.7)
Alpha 1: 0.2 g/dL (ref 0.0–0.4)
Alpha2 Glob SerPl Elph-Mcnc: 0.6 g/dL (ref 0.4–1.0)
B-Globulin SerPl Elph-Mcnc: 0.8 g/dL (ref 0.7–1.3)
Gamma Glob SerPl Elph-Mcnc: 0.3 g/dL — ABNORMAL LOW (ref 0.4–1.8)
Globulin, Total: 1.9 g/dL — ABNORMAL LOW (ref 2.2–3.9)
IgA: <5 mg/dL — ABNORMAL LOW (ref 64–422)
IgG (Immunoglobin G), Serum: 348 mg/dL — ABNORMAL LOW (ref 586–1602)
IgM (Immunoglobulin M), Srm: <5 mg/dL — ABNORMAL LOW (ref 26–217)
M Protein SerPl Elph-Mcnc: 0.2 g/dL — ABNORMAL HIGH
Total Protein ELP: 5.7 g/dL — ABNORMAL LOW (ref 6.0–8.5)

## 2023-11-30 DIAGNOSIS — Z1152 Encounter for screening for COVID-19: Secondary | ICD-10-CM | POA: Diagnosis not present

## 2023-11-30 DIAGNOSIS — C9000 Multiple myeloma not having achieved remission: Secondary | ICD-10-CM | POA: Diagnosis not present

## 2023-11-30 DIAGNOSIS — R051 Acute cough: Secondary | ICD-10-CM | POA: Diagnosis not present

## 2023-11-30 DIAGNOSIS — R5383 Other fatigue: Secondary | ICD-10-CM | POA: Diagnosis not present

## 2023-11-30 DIAGNOSIS — H938X3 Other specified disorders of ear, bilateral: Secondary | ICD-10-CM | POA: Diagnosis not present

## 2023-11-30 DIAGNOSIS — J00 Acute nasopharyngitis [common cold]: Secondary | ICD-10-CM | POA: Diagnosis not present

## 2023-12-02 ENCOUNTER — Encounter: Payer: Self-pay | Admitting: Hematology and Oncology

## 2023-12-05 ENCOUNTER — Other Ambulatory Visit: Payer: Self-pay | Admitting: Hematology and Oncology

## 2023-12-05 ENCOUNTER — Other Ambulatory Visit: Payer: Self-pay | Admitting: *Deleted

## 2023-12-05 DIAGNOSIS — C9000 Multiple myeloma not having achieved remission: Secondary | ICD-10-CM

## 2023-12-05 MED ORDER — LENALIDOMIDE 10 MG PO CAPS
10.0000 mg | ORAL_CAPSULE | Freq: Every day | ORAL | 0 refills | Status: DC
Start: 2023-12-05 — End: 2023-12-10

## 2023-12-07 ENCOUNTER — Other Ambulatory Visit: Payer: Self-pay

## 2023-12-09 NOTE — Progress Notes (Unsigned)
 [facility] [phone]   [phone]  PROGRESS NOTE  Patient Care Team: Rodrigo Ran, MD as PCP - General (Internal Medicine)  Hematological/Oncological History # Free Lambda Multiple Myeloma 11/10/2022: WBC 5.68, Hgb 11.7, MCV 97.4, Plt 146 04/17/2023: WBC 4.53, Hgb 11.5, MCV 107.6, Plt 110 05/16/2023: establish care with Dr. Leonides Schanz. Labs showed M protein 0.1 with Lambda 900, Kappa 3 with ratio 0.00.   06/01/2023: bone marrow biopsy performed, showed plasma cell myeloma nearly effacing approximately 80% of the cellular marrow.  06/25/2023: Cycle 1 Day 1 of VRD chemotherapy 07/23/2023: Cycle 2 Day 1 of VRD chemotherapy 08/13/2023: Cycle 3 Day 1 of VRD chemotherapy 09/03/2023: Cycle 1 Day 1 of Dara/Rev/Dex. Transitioned off velcade due to ocular toxicity.  Interval History:  Brooke Whitaker 73 y.o. female with medical history significant for newly diagnosed multiple myeloma who presents for a follow up visit. The patient's last visit was on 11/11/2022. In the interim since the last visit she has had no major changes in her health.  On exam today Mrs. Wojtowicz reports she has been well overall interim since her last visit.  She reports she has had no major side effects or issues on the Revlimid therapy.  She reports she is tolerating the Darzalex fine.  She reports that she did have 1 episode of feeling nausea that resolved with ginger ale.  She did not require any Zofran and it did not cause her to vomit.  She is not having any trouble with constipation or diarrhea.  She has been taking Senokot nightly.  She reports that she did have an issue with some cough and sore throat for which she took Mucinex.  It was not associated with fevers or chills.  She reports her appetite has been pretty good though her energy levels have only been "okay". She denies any fevers, chills, sweats, nausea, vomiting, or diarrhea.  A full 10 point ROS is otherwise negative.  Today we  discussed the results of her serum free light chains.  We discussed that her lambda is rising despite optimal treatment with Dara/rev/Dex.  We noted that this increase has begun to pick up momentum and that she has not dropped any further since we transition her off the Velcade therapy.  Given this I recommend that we transition to Kyprolis.  We discussed the risks and benefits of this treatment and she voiced understanding and was willing and able to proceed.  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    Sigmoid diverticulosis    Wears glasses     SURGICAL HISTORY: Past Surgical History:  Procedure Laterality Date   BREAST BIOPSY Right 09/2011   benign    COLONOSCOPY  2010   CYSTOSCOPY/RETROGRADE/URETEROSCOPY/STONE EXTRACTION WITH BASKET Left 04/21/2014   Procedure: cystoscopy, left retrograde pylegram, LEFT URETEROSCOPY, laser lithotripsy with holmium laser, STONE EXTRACTION, uretheral calibration;  Surgeon: Anner Crete, MD;  Location: [facility];  Service: Urology;  Laterality: Left;   DILATION AND EVACUATION  1995   EPISIOTOMY REPAIR, SEVERE TEAR  1989   LAPAROSCOPIC OVARIAN CYSTECTOMY  10/ 2000   NM MYOCAR PERF WALL MOTION  12/15/2009   Normal   US ECHOCARDIOGRAPHY  12/15/2009   Normal study for age: mild MR,TR and  trace PI    SOCIAL HISTORY: Social History   Socioeconomic History   Marital status: Married    Spouse name: Not on file   Number of children: 2   Years of education: Not on file   Highest education level: Not on file  Occupational History   Occupation: Print production planner  Tobacco Use   Smoking status: Never    Passive exposure: Never   Smokeless tobacco: Never  Vaping Use   Vaping status: Never Used  Substance and Sexual Activity   Alcohol use: Yes    Alcohol/week:  2.0 standard drinks of alcohol    Types: 2 Glasses of wine per week   Drug use: No   Sexual activity: Not Currently    Partners: Male  Other Topics Concern   Not on file  Social History Narrative   Not on file   Social Drivers of Health   Financial Resource Strain: Not on file  Food Insecurity: No Food Insecurity (05/16/2023)   Hunger Vital Sign    Worried About Running Out of Food in the Last Year: Never true    Ran Out of Food in the Last Year: Never true  Transportation Needs: No Transportation Needs (05/16/2023)   PRAPARE - Administrator, Civil Service (Medical): No    Lack of Transportation (Non-Medical): No  Physical Activity: Not on file  Stress: Not on file  Social Connections: Not on file  Intimate Partner Violence: Not At Risk (05/16/2023)   Humiliation, Afraid, Rape, and Kick questionnaire    Fear of Current or Ex-Partner: No    Emotionally Abused: No    Physically Abused: No    Sexually Abused: No    FAMILY HISTORY: Family History  Problem Relation Age of Onset   Diabetes Mother    Congestive Heart Failure Mother    Stroke Mother    Osteoporosis Mother    Other Mother        gallbladder ruptured   Heart attack Father    Diabetes Sister    Heart disease Sister    Heart disease Sister        x2   Hypertension Sister    Thyroid disease Sister    Hypertension Sister    Diabetes Brother    Dementia Brother    Colon cancer Brother        mets to liver   Heart disease Brother        pacemaker   Stroke Brother    Liver cancer Brother    Heart disease Brother    Stomach cancer Neg Hx    Pancreatic cancer Neg Hx    Esophageal cancer Neg Hx     ALLERGIES:  is allergic to penicillins and sulfa antibiotics.  MEDICATIONS:  Current Outpatient Medications  Medication Sig Dispense Refill   acetaminophen (TYLENOL) 500 MG tablet Take 500 mg by mouth every 6 (six) hours as needed for mild pain or headache.      acyclovir (ZOVIRAX) 400 MG tablet  TAKE 1 TABLET BY MOUTH TWICE A DAY 180 tablet 1   allopurinol (ZYLOPRIM) 300 MG tablet Take 1 tablet (300 mg total) by mouth daily. 90 tablet 1   aspirin EC 81 MG tablet Take 81 mg by mouth daily. Swallow whole.     cetirizine (ZYRTEC) 5 MG tablet Take 5 mg by mouth daily as needed for allergies.     cholecalciferol (VITAMIN D) 1000 UNITS tablet Take 1,000 Units by mouth every morning.  Cyanocobalamin (VITAMIN B 12 PO) Take 1,000 mcg by mouth daily.     Daratumumab-Hyaluronidase-fihj (DARZALEX FASPRO Snowflake) Inject into the skin once a week. Every Monday     dexamethasone (DECADRON) 4 MG tablet Take 10 tablets (40 mg total) by mouth once a week. Take once weekly on the day you receive the chemotherapy injection. 40 tablet 5   ELDERBERRY PO Take 1 tablet by mouth daily.     fluticasone (FLONASE) 50 MCG/ACT nasal spray Place into the nose as needed.     ondansetron (ZOFRAN) 8 MG tablet Take 1 tablet (8 mg total) by mouth every 8 (eight) hours as needed. 30 tablet 0   prochlorperazine (COMPAZINE) 10 MG tablet Take 1 tablet (10 mg total) by mouth every 6 (six) hours as needed for nausea or vomiting. 30 tablet 0   rosuvastatin (CRESTOR) 5 MG tablet Take 5 mg by mouth daily.     valACYclovir (VALTREX) 500 MG tablet TAKE 4 TABLETS BY MOUTH EVERY 12 HOURS X 1 DAY FOR FEVER BLISTER AS NEEDED. 30 tablet 2   No current facility-administered medications for this visit.    REVIEW OF SYSTEMS:   Constitutional: ( - ) fevers, ( - )  chills , ( - ) night sweats Eyes: ( - ) blurriness of vision, ( - ) double vision, ( - ) watery eyes Ears, nose, mouth, throat, and face: ( - ) mucositis, ( - ) sore throat Respiratory: ( - ) cough, ( - ) dyspnea, ( - ) wheezes Cardiovascular: ( - ) palpitation, ( - ) chest discomfort, ( - ) lower extremity swelling Gastrointestinal:  ( - ) nausea, ( - ) heartburn, ( - ) change in bowel habits Skin: ( - ) abnormal skin rashes Lymphatics: ( - ) new lymphadenopathy, ( - ) easy  bruising Neurological: ( - ) numbness, ( - ) tingling, ( - ) new weaknesses Behavioral/Psych: ( - ) mood change, ( - ) new changes  All other systems were reviewed with the patient and are negative.  PHYSICAL EXAMINATION: ECOG PERFORMANCE STATUS: 0 - Asymptomatic  Vitals:   12/10/23 1032  BP: 139/63  Pulse: 72  Resp: 14  Temp: 98.3 F (36.8 C)  SpO2: 98%     Filed Weights   12/10/23 1032  Weight: 142 lb 4.8 oz (64.5 kg)    GENERAL: Well-appearing elderly Caucasian female, alert, no distress and comfortable SKIN: skin color, texture, turgor are normal, no rashes or significant lesions EYES: conjunctiva are pink and non-injected, sclera clear LUNGS: clear to auscultation and percussion with normal breathing effort HEART: regular rate & rhythm and no murmurs and no lower extremity edema Musculoskeletal: no cyanosis of digits and no clubbing  PSYCH: alert & oriented x 3, fluent speech NEURO: no focal motor/sensory deficits  LABORATORY DATA:  I have reviewed the data as listed    Latest Ref Rng & Units 12/10/2023    9:40 AM 11/26/2023    1:23 PM 11/12/2023   10:53 AM  CBC  WBC 4.0 - 10.5 K/uL 6.1  5.5  4.4   Hemoglobin 12.0 - 15.0 g/dL 16.1  09.6  04.5   Hematocrit 36.0 - 46.0 % 43.0  43.0  42.8   Platelets 150 - 400 K/uL 141  122  153        Latest Ref Rng & Units 12/10/2023    9:40 AM 11/26/2023    1:23 PM 11/12/2023   10:53 AM  CMP  Glucose 70 - 99  mg/dL 88  161  89   BUN 8 - 23 mg/dL 14  20  14    Creatinine 0.44 - 1.00 mg/dL 0.96  0.45  4.09   Sodium 135 - 145 mmol/L 145  140  142   Potassium 3.5 - 5.1 mmol/L 3.8  3.9  3.5   Chloride 98 - 111 mmol/L 108  108  107   CO2 22 - 32 mmol/L 34  28  31   Calcium 8.9 - 10.3 mg/dL 8.9  9.0  8.8   Total Protein 6.5 - 8.1 g/dL 5.9  5.9  5.6   Total Bilirubin 0.0 - 1.2 mg/dL 1.1  1.0  1.7   Alkaline Phos 38 - 126 U/L 93  94  82   AST 15 - 41 U/L 23  19  19    ALT 0 - 44 U/L 21  17  19      Lab Results  Component Value  Date   MPROTEIN 0.2 (H) 11/26/2023   MPROTEIN 0.1 (H) 10/29/2023   MPROTEIN 0.1 (H) 10/01/2023   Lab Results  Component Value Date   KPAFRELGTCHN 3.2 (L) 11/26/2023   KPAFRELGTCHN 3.4 10/29/2023   KPAFRELGTCHN 3.3 10/01/2023   LAMBDASER 79.0 (H) 11/26/2023   LAMBDASER 63.0 (H) 10/29/2023   LAMBDASER 64.3 (H) 10/01/2023   KAPLAMBRATIO 0.04 (L) 11/26/2023   KAPLAMBRATIO 0.05 (L) 10/29/2023   KAPLAMBRATIO 0.05 (L) 10/01/2023    RADIOGRAPHIC STUDIES: No results found.  ASSESSMENT & PLAN Brooke Whitaker 73 y.o. female with medical history significant for newly diagnosed multiple myeloma who presents for a follow up visit.   Previously we discussed the diagnosis of multiple myeloma.  We discussed that this is a blood cancer that is not curable.  The goal is to get the patient into a durable remission with treatment.  We discussed the need for a minimum of 6 months of chemotherapy treatment then followed by maintenance therapy if the M protein has reached 0 and the free light chains are within normal limits.  We discussed expected side effects of the regimen and the plan moving forward.  The patient voiced her understanding of our findings, expected side effects, and risks/benefits.  She is okay with proceeding with chemotherapy at this time.  # Free Lambda Multiple Myeloma -- Diagnosis confirmed by the presence of a macrocytic anemia and 80% plasma cells in the bone marrow --Initially started with VRD chemotherapy with the patient developed rash as result of the Revlimid and ocular toxicity from the Velcade.  Dropped the Revlimid down to 10 mg p.o. daily and transition to Darzalex for her next treatment. --UPEP showed marked Bence Jones proteins in the urine, no evidence of lytic lesions on metastatic survey PLAN: -- Plan to hold Dara/Rev/Dex and start Kyprolis monotherapy next week. --Labs today show white blood cell 6.1, Hgb 14.1, MCV 93.3, Plt 141 --On 11/26/2023 patient had kappa 3.2,  Lambda 79.0, Ratio 0.04  -- Plan for return to clinic next week for the start of Kyprolis therapy.  #Supportive Care -- chemotherapy education complete -- port placement not required  -- ASA 81 mg PO daily to be taken with revlimid.  Can be discontinued after Revlimid is stopped. -- zofran 8mg  q8H PRN and compazine 10mg  PO q6H for nausea -- acyclovir 400mg  PO BID for VCZ prophylaxis -- EMLA cream for port --Will discuss the need for Zometa treatment after symptoms of her regimen have stabilized. -- no pain medication required at this time.  Orders Placed This Encounter  Procedures   CBC with Differential (Cancer Center Only)    Standing Status:   Future    Expected Date:   12/17/2023    Expiration Date:   12/16/2024   CMP (Cancer Center only)    Standing Status:   Future    Expected Date:   12/17/2023    Expiration Date:   12/16/2024   CBC with Differential (Cancer Center Only)    Standing Status:   Future    Expected Date:   12/24/2023    Expiration Date:   12/23/2024   CMP (Cancer Center only)    Standing Status:   Future    Expected Date:   12/24/2023    Expiration Date:   12/23/2024   CBC with Differential (Cancer Center Only)    Standing Status:   Future    Expected Date:   12/31/2023    Expiration Date:   12/30/2024   CMP (Cancer Center only)    Standing Status:   Future    Expected Date:   12/31/2023    Expiration Date:   12/30/2024    All questions were answered. The patient knows to call the clinic with any problems, questions or concerns.  A total of more than 30 minutes were spent on this encounter with face-to-face time and non-face-to-face time, including preparing to see the patient, ordering tests and/or medications, counseling the patient and coordination of care as outlined above.   Ulysees Barns, MD Department of Hematology/Oncology [facility] Phone: [phone] Pager: [phone] Email:  [email or web address]  12/11/2023 5:05 PM

## 2023-12-10 ENCOUNTER — Inpatient Hospital Stay: Payer: HMO

## 2023-12-10 ENCOUNTER — Other Ambulatory Visit: Payer: Self-pay | Admitting: *Deleted

## 2023-12-10 ENCOUNTER — Inpatient Hospital Stay (HOSPITAL_BASED_OUTPATIENT_CLINIC_OR_DEPARTMENT_OTHER): Payer: HMO | Admitting: Hematology and Oncology

## 2023-12-10 VITALS — BP 139/63 | HR 72 | Temp 98.3°F | Resp 14 | Wt 142.3 lb

## 2023-12-10 DIAGNOSIS — Z5112 Encounter for antineoplastic immunotherapy: Secondary | ICD-10-CM | POA: Diagnosis not present

## 2023-12-10 DIAGNOSIS — C9000 Multiple myeloma not having achieved remission: Secondary | ICD-10-CM

## 2023-12-10 DIAGNOSIS — D539 Nutritional anemia, unspecified: Secondary | ICD-10-CM | POA: Diagnosis not present

## 2023-12-10 LAB — CBC WITH DIFFERENTIAL (CANCER CENTER ONLY)
Abs Immature Granulocytes: 0.01 10*3/uL (ref 0.00–0.07)
Basophils Absolute: 0 10*3/uL (ref 0.0–0.1)
Basophils Relative: 1 %
Eosinophils Absolute: 0.2 10*3/uL (ref 0.0–0.5)
Eosinophils Relative: 4 %
HCT: 43 % (ref 36.0–46.0)
Hemoglobin: 14.1 g/dL (ref 12.0–15.0)
Immature Granulocytes: 0 %
Lymphocytes Relative: 48 %
Lymphs Abs: 3 10*3/uL (ref 0.7–4.0)
MCH: 30.6 pg (ref 26.0–34.0)
MCHC: 32.8 g/dL (ref 30.0–36.0)
MCV: 93.3 fL (ref 80.0–100.0)
Monocytes Absolute: 0.4 10*3/uL (ref 0.1–1.0)
Monocytes Relative: 6 %
Neutro Abs: 2.5 10*3/uL (ref 1.7–7.7)
Neutrophils Relative %: 41 %
Platelet Count: 141 10*3/uL — ABNORMAL LOW (ref 150–400)
RBC: 4.61 MIL/uL (ref 3.87–5.11)
RDW: 14.7 % (ref 11.5–15.5)
WBC Count: 6.1 10*3/uL (ref 4.0–10.5)
nRBC: 0 % (ref 0.0–0.2)

## 2023-12-10 LAB — CMP (CANCER CENTER ONLY)
ALT: 21 U/L (ref 0–44)
AST: 23 U/L (ref 15–41)
Albumin: 4 g/dL (ref 3.5–5.0)
Alkaline Phosphatase: 93 U/L (ref 38–126)
Anion gap: 3 — ABNORMAL LOW (ref 5–15)
BUN: 14 mg/dL (ref 8–23)
CO2: 34 mmol/L — ABNORMAL HIGH (ref 22–32)
Calcium: 8.9 mg/dL (ref 8.9–10.3)
Chloride: 108 mmol/L (ref 98–111)
Creatinine: 0.72 mg/dL (ref 0.44–1.00)
GFR, Estimated: >60 mL/min (ref >60)
Glucose, Bld: 88 mg/dL (ref 70–99)
Potassium: 3.8 mmol/L (ref 3.5–5.1)
Sodium: 145 mmol/L (ref 135–145)
Total Bilirubin: 1.1 mg/dL (ref 0.0–1.2)
Total Protein: 5.9 g/dL — ABNORMAL LOW (ref 6.5–8.1)

## 2023-12-10 MED ORDER — ALLOPURINOL 300 MG PO TABS: 300.0000 mg | ORAL_TABLET | Freq: Every day | ORAL | 1 refills | Status: DC

## 2023-12-10 NOTE — Progress Notes (Signed)
 DISCONTINUE OFF PATHWAY REGIMEN - Multiple Myeloma and Other Plasma Cell Dyscrasias   [id number]:DaraRd (Daratumumab SUBQ + Lenalidomide PO + Dexamethasone PO/IV) q28 Days:   Cycles 1 and 2: A cycle is every 28 days:     Lenalidomide      Dexamethasone      Daratumumab and hyaluronidase-fihj    Cycles 3 through 6: A cycle is every 28 days:     Lenalidomide      Dexamethasone      Dexamethasone      Daratumumab and hyaluronidase-fihj    Cycles 7 and beyond: A cycle is every 28 days:     Lenalidomide      Dexamethasone      Dexamethasone      Daratumumab and hyaluronidase-fihj   **Always confirm dose/schedule in your pharmacy ordering system**  PRIOR TREATMENT: Off Pathway: DaraRd (Daratumumab SUBQ + Lenalidomide PO + Dexamethasone PO/IV) q28 Days  START ON PATHWAY REGIMEN - Multiple Myeloma and Other Plasma Cell Dyscrasias     A cycle is every 28 days:     Carfilzomib      Carfilzomib      Carfilzomib      Dexamethasone      Dexamethasone   **Always confirm dose/schedule in your pharmacy ordering system**  Patient Characteristics: Multiple Myeloma, Relapsed / Refractory, Second through Fourth Lines of Therapy, Not a Candidate for CAR T-cell Therapy, Fit or Candidate for Triplet Therapy, Lenalidomide-Refractory or Lenalidomide-based Regimen Not Preferred, Not a Candidate for  Anti-CD38 Antibody Disease Classification: Multiple Myeloma Therapeutic Status: Refractory R2-ISS Staging: II Line of Therapy: Third Line Anti-CD38 Antibody Candidacy: Not a Candidate for Anti-CD38 Antibody Lenalidomide-based Regimen Preference/Candidacy: Lenalidomide-Refractory Intent of Therapy: Non-Curative / Palliative Intent, Discussed with Patient

## 2023-12-11 ENCOUNTER — Encounter: Payer: Self-pay | Admitting: Hematology and Oncology

## 2023-12-13 ENCOUNTER — Encounter: Payer: Self-pay | Admitting: Hematology and Oncology

## 2023-12-14 ENCOUNTER — Encounter: Payer: Self-pay | Admitting: Hematology and Oncology

## 2023-12-14 MED FILL — Dexamethasone Sodium Phosphate Inj 100 MG/10ML: INTRAMUSCULAR | Qty: 4 | Status: AC

## 2023-12-17 ENCOUNTER — Encounter: Payer: Self-pay | Admitting: Hematology and Oncology

## 2023-12-17 ENCOUNTER — Inpatient Hospital Stay: Payer: HMO

## 2023-12-17 ENCOUNTER — Inpatient Hospital Stay: Payer: HMO | Attending: Hematology and Oncology

## 2023-12-17 VITALS — BP 140/61 | HR 88 | Temp 97.9°F | Resp 16 | Wt 143.0 lb

## 2023-12-17 DIAGNOSIS — Z79899 Other long term (current) drug therapy: Secondary | ICD-10-CM | POA: Diagnosis not present

## 2023-12-17 DIAGNOSIS — C9000 Multiple myeloma not having achieved remission: Secondary | ICD-10-CM | POA: Diagnosis not present

## 2023-12-17 DIAGNOSIS — Z5112 Encounter for antineoplastic immunotherapy: Secondary | ICD-10-CM | POA: Diagnosis not present

## 2023-12-17 LAB — CBC WITH DIFFERENTIAL (CANCER CENTER ONLY)
Abs Immature Granulocytes: 0.02 10*3/uL (ref 0.00–0.07)
Basophils Absolute: 0.1 10*3/uL (ref 0.0–0.1)
Basophils Relative: 1 %
Eosinophils Absolute: 0 10*3/uL (ref 0.0–0.5)
Eosinophils Relative: 0 %
HCT: 44.8 % (ref 36.0–46.0)
Hemoglobin: 15 g/dL (ref 12.0–15.0)
Immature Granulocytes: 0 %
Lymphocytes Relative: 21 %
Lymphs Abs: 1.1 10*3/uL (ref 0.7–4.0)
MCH: 30.8 pg (ref 26.0–34.0)
MCHC: 33.5 g/dL (ref 30.0–36.0)
MCV: 92 fL (ref 80.0–100.0)
Monocytes Absolute: 0.2 10*3/uL (ref 0.1–1.0)
Monocytes Relative: 3 %
Neutro Abs: 4 10*3/uL (ref 1.7–7.7)
Neutrophils Relative %: 75 %
Platelet Count: 147 10*3/uL — ABNORMAL LOW (ref 150–400)
RBC: 4.87 MIL/uL (ref 3.87–5.11)
RDW: 14.7 % (ref 11.5–15.5)
WBC Count: 5.4 10*3/uL (ref 4.0–10.5)
nRBC: 0 % (ref 0.0–0.2)

## 2023-12-17 LAB — CMP (CANCER CENTER ONLY)
ALT: 21 U/L (ref 0–44)
AST: 20 U/L (ref 15–41)
Albumin: 4.4 g/dL (ref 3.5–5.0)
Alkaline Phosphatase: 108 U/L (ref 38–126)
Anion gap: 6 (ref 5–15)
BUN: 20 mg/dL (ref 8–23)
CO2: 27 mmol/L (ref 22–32)
Calcium: 9.3 mg/dL (ref 8.9–10.3)
Chloride: 107 mmol/L (ref 98–111)
Creatinine: 0.59 mg/dL (ref 0.44–1.00)
GFR, Estimated: >60 mL/min (ref >60)
Glucose, Bld: 132 mg/dL — ABNORMAL HIGH (ref 70–99)
Potassium: 4.1 mmol/L (ref 3.5–5.1)
Sodium: 140 mmol/L (ref 135–145)
Total Bilirubin: 1.2 mg/dL (ref 0.0–1.2)
Total Protein: 6.5 g/dL (ref 6.5–8.1)

## 2023-12-17 MED ORDER — SODIUM CHLORIDE 0.9 % IV SOLN
INTRAVENOUS | Status: DC
Start: 2023-12-17 — End: 2023-12-17

## 2023-12-17 MED ORDER — SODIUM CHLORIDE 0.9 % IV SOLN
40.0000 mg | Freq: Once | INTRAVENOUS | Status: DC
  Filled 2023-12-17: qty 4

## 2023-12-17 MED ORDER — DEXTROSE 5 % IV SOLN
30.0000 mg | Freq: Once | INTRAVENOUS | Status: AC
  Administered 2023-12-17: 30 mg via INTRAVENOUS
  Filled 2023-12-17: qty 15

## 2023-12-17 MED ORDER — SODIUM CHLORIDE 0.9 % IV SOLN: Freq: Once | INTRAVENOUS | Status: AC

## 2023-12-17 NOTE — Progress Notes (Signed)
 Patient states she took 40mg  Decadron at home this AM as ordered by Dr Leonides Schanz. Verified with Dr Derek Mound nurse, Binnie Rail, that patient does not need additional IV dose of Decadron. Pharmacy notified.

## 2023-12-17 NOTE — Patient Instructions (Signed)

## 2023-12-18 ENCOUNTER — Encounter: Payer: Self-pay | Admitting: Hematology and Oncology

## 2023-12-19 ENCOUNTER — Other Ambulatory Visit: Payer: Self-pay | Admitting: *Deleted

## 2023-12-19 DIAGNOSIS — C9000 Multiple myeloma not having achieved remission: Secondary | ICD-10-CM

## 2023-12-24 ENCOUNTER — Encounter: Payer: Self-pay | Admitting: Cardiovascular Disease

## 2023-12-24 ENCOUNTER — Telehealth: Payer: Self-pay

## 2023-12-24 DIAGNOSIS — I429 Cardiomyopathy, unspecified: Secondary | ICD-10-CM

## 2023-12-24 NOTE — Telephone Encounter (Signed)
 [facility] Clinical Social Work  Clinical Social Work was referred by nurse for assessment of psychosocial needs.  Clinical Social Worker attempted to contact patient by phone to offer support and assess for needs.  CSW was unable to reach patient, left vm with direct contact.  CSW will attempt again.  Marguerita Merles, LCSW  Clinical Social Worker [facility]

## 2023-12-25 ENCOUNTER — Inpatient Hospital Stay: Payer: HMO

## 2023-12-25 ENCOUNTER — Inpatient Hospital Stay: Payer: HMO | Admitting: Hematology and Oncology

## 2023-12-25 VITALS — BP 150/76 | HR 80 | Temp 98.4°F | Resp 15 | Wt 143.2 lb

## 2023-12-25 DIAGNOSIS — Z5112 Encounter for antineoplastic immunotherapy: Secondary | ICD-10-CM | POA: Diagnosis not present

## 2023-12-25 DIAGNOSIS — C9000 Multiple myeloma not having achieved remission: Secondary | ICD-10-CM

## 2023-12-25 LAB — CMP (CANCER CENTER ONLY)
ALT: 19 U/L (ref 0–44)
AST: 19 U/L (ref 15–41)
Albumin: 4.2 g/dL (ref 3.5–5.0)
Alkaline Phosphatase: 97 U/L (ref 38–126)
Anion gap: 5 (ref 5–15)
BUN: 15 mg/dL (ref 8–23)
CO2: 30 mmol/L (ref 22–32)
Calcium: 8.7 mg/dL — ABNORMAL LOW (ref 8.9–10.3)
Chloride: 109 mmol/L (ref 98–111)
Creatinine: 0.67 mg/dL (ref 0.44–1.00)
GFR, Estimated: >60 mL/min (ref >60)
Glucose, Bld: 102 mg/dL — ABNORMAL HIGH (ref 70–99)
Potassium: 3.7 mmol/L (ref 3.5–5.1)
Sodium: 144 mmol/L (ref 135–145)
Total Bilirubin: 1.3 mg/dL — ABNORMAL HIGH (ref 0.0–1.2)
Total Protein: 5.8 g/dL — ABNORMAL LOW (ref 6.5–8.1)

## 2023-12-25 LAB — CBC WITH DIFFERENTIAL (CANCER CENTER ONLY)
Abs Immature Granulocytes: 0.02 10*3/uL (ref 0.00–0.07)
Basophils Absolute: 0.1 10*3/uL (ref 0.0–0.1)
Basophils Relative: 1 %
Eosinophils Absolute: 0.1 10*3/uL (ref 0.0–0.5)
Eosinophils Relative: 2 %
HCT: 44 % (ref 36.0–46.0)
Hemoglobin: 14.8 g/dL (ref 12.0–15.0)
Immature Granulocytes: 0 %
Lymphocytes Relative: 33 %
Lymphs Abs: 2.4 10*3/uL (ref 0.7–4.0)
MCH: 31.2 pg (ref 26.0–34.0)
MCHC: 33.6 g/dL (ref 30.0–36.0)
MCV: 92.8 fL (ref 80.0–100.0)
Monocytes Absolute: 0.3 10*3/uL (ref 0.1–1.0)
Monocytes Relative: 5 %
Neutro Abs: 4.4 10*3/uL (ref 1.7–7.7)
Neutrophils Relative %: 59 %
Platelet Count: 159 10*3/uL (ref 150–400)
RBC: 4.74 MIL/uL (ref 3.87–5.11)
RDW: 14.5 % (ref 11.5–15.5)
WBC Count: 7.4 10*3/uL (ref 4.0–10.5)
nRBC: 0 % (ref 0.0–0.2)

## 2023-12-25 MED ORDER — SODIUM CHLORIDE 0.9 % IV SOLN: Freq: Once | INTRAVENOUS | Status: AC

## 2023-12-25 MED ORDER — DEXTROSE 5 % IV SOLN
70.0000 mg/m2 | Freq: Once | INTRAVENOUS | Status: AC
  Administered 2023-12-25: 120 mg via INTRAVENOUS
  Filled 2023-12-25: qty 60

## 2023-12-25 NOTE — Progress Notes (Signed)
 [facility] [phone]   [phone]  PROGRESS NOTE  Patient Care Team: Rodrigo Ran, MD as PCP - General (Internal Medicine)  Hematological/Oncological History # Free Lambda Multiple Myeloma 11/10/2022: WBC 5.68, Hgb 11.7, MCV 97.4, Plt 146 04/17/2023: WBC 4.53, Hgb 11.5, MCV 107.6, Plt 110 05/16/2023: establish care with Dr. Leonides Schanz. Labs showed M protein 0.1 with Lambda 900, Kappa 3 with ratio 0.00.   06/01/2023: bone marrow biopsy performed, showed plasma cell myeloma nearly effacing approximately 80% of the cellular marrow.  06/25/2023: Cycle 1 Day 1 of VRD chemotherapy 07/23/2023: Cycle 2 Day 1 of VRD chemotherapy 08/13/2023: Cycle 3 Day 1 of VRD chemotherapy 09/03/2023: Cycle 1 Day 1 of Dara/Rev/Dex. Transitioned off velcade due to ocular toxicity. 12/17/2023: Cycle 1 Day 1 of Kyprolis. Dara/Rev ineffective.   Interval History:  Brooke Whitaker 73 y.o. female with medical history significant for newly diagnosed multiple myeloma who presents for a follow up visit. The patient's last visit was on 12/10/2023. In the interim since the last visit she has had no major changes in her health.  On exam today Brooke Whitaker reports she tolerated her first treatment of Kyprolis well.  She is quite anxious about undergoing a treatment that is lifelong.  She reports she is not having any numbness or tingling of her fingers and toes.  Overall she feels well and is willing and able to proceed with this treatment at this time.  The bulk of our discussion today focused on the diagnosis, treatment options moving forward, and overall plan.. She denies any fevers, chills, sweats, nausea, vomiting, or diarrhea.  A full 10 point ROS is otherwise negative.  Previously we discussed the results of her serum free light chains.  We discussed that her lambda is rising despite optimal treatment with Dara/rev/Dex.  We noted that this increase has begun to pick up momentum and that she has not  dropped any further since we transition her off the Velcade therapy.  Given this I recommend that we transition to Kyprolis.  We discussed the risks and benefits of this treatment and she voiced understanding and was willing and able to proceed.  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    Sigmoid diverticulosis    Wears glasses     SURGICAL HISTORY: Past Surgical History:  Procedure Laterality Date   BREAST BIOPSY Right 09/2011   benign    COLONOSCOPY  2010   CYSTOSCOPY/RETROGRADE/URETEROSCOPY/STONE EXTRACTION WITH BASKET Left 04/21/2014   Procedure: cystoscopy, left retrograde pylegram, LEFT URETEROSCOPY, laser lithotripsy with holmium laser, STONE EXTRACTION, uretheral calibration;  Surgeon: Anner Crete, MD;  Location: [facility];  Service: Urology;  Laterality: Left;   DILATION AND EVACUATION  1995   EPISIOTOMY REPAIR, SEVERE TEAR  1989   LAPAROSCOPIC OVARIAN CYSTECTOMY  10/ 2000   NM MYOCAR PERF WALL MOTION  12/15/2009   Normal   US ECHOCARDIOGRAPHY  12/15/2009   Normal study for age: mild MR,TR and trace PI    SOCIAL HISTORY: Social History   Socioeconomic History   Marital status: Married    Spouse name: Not on file   Number of children: 2   Years of education: Not on file   Highest education level: Not on file  Occupational History   Occupation: Print production planner  Tobacco Use   Smoking status: Never    Passive exposure: Never   Smokeless tobacco: Never  Vaping Use   Vaping status: Never Used  Substance and Sexual Activity   Alcohol use: Yes    Alcohol/week: 2.0 standard drinks of alcohol    Types: 2 Glasses of wine per week   Drug use: No   Sexual activity: Not Currently    Partners: Male  Other Topics Concern   Not on file  Social History Narrative   Not on file    Social Drivers of Health   Financial Resource Strain: Not on file  Food Insecurity: No Food Insecurity (05/16/2023)   Hunger Vital Sign    Worried About Running Out of Food in the Last Year: Never true    Ran Out of Food in the Last Year: Never true  Transportation Needs: No Transportation Needs (05/16/2023)   PRAPARE - Administrator, Civil Service (Medical): No    Lack of Transportation (Non-Medical): No  Physical Activity: Not on file  Stress: Not on file  Social Connections: Not on file  Intimate Partner Violence: Not At Risk (05/16/2023)   Humiliation, Afraid, Rape, and Kick questionnaire    Fear of Current or Ex-Partner: No    Emotionally Abused: No    Physically Abused: No    Sexually Abused: No    FAMILY HISTORY: Family History  Problem Relation Age of Onset   Diabetes Mother    Congestive Heart Failure Mother    Stroke Mother    Osteoporosis Mother    Other Mother        gallbladder ruptured   Heart attack Father    Diabetes Sister    Heart disease Sister    Heart disease Sister        x2   Hypertension Sister    Thyroid disease Sister    Hypertension Sister    Diabetes Brother    Dementia Brother    Colon cancer Brother        mets to liver   Heart disease Brother        pacemaker   Stroke Brother    Liver cancer Brother    Heart disease Brother    Stomach cancer Neg Hx    Pancreatic cancer Neg Hx    Esophageal cancer Neg Hx     ALLERGIES:  is allergic to penicillins and sulfa antibiotics.  MEDICATIONS:  Current Outpatient Medications  Medication Sig Dispense Refill   DOXYCYCLINE PO Take by mouth.     acetaminophen (TYLENOL) 500 MG tablet Take 500 mg by mouth every 6 (six) hours as needed for mild pain or headache.      acyclovir (ZOVIRAX) 400 MG tablet TAKE 1 TABLET BY MOUTH TWICE A DAY (Patient not taking: Reported on 12/25/2023) 180 tablet 1   allopurinol (ZYLOPRIM) 300 MG tablet Take 1 tablet (300 mg total) by mouth daily. (Patient  not taking: Reported on 12/25/2023) 90 tablet 1   aspirin EC 81 MG tablet Take 81 mg by mouth daily. Swallow whole. (Patient not taking: Reported on 12/25/2023)     cetirizine (ZYRTEC) 5 MG tablet Take 5 mg by mouth daily as needed for allergies.     cholecalciferol (VITAMIN D) 1000 UNITS tablet Take 1,000 Units by mouth every morning.      Cyanocobalamin (VITAMIN B 12 PO) Take 1,000 mcg by mouth daily.     Daratumumab-Hyaluronidase-fihj (DARZALEX FASPRO Momence)  Inject into the skin once a week. Every Monday (Patient not taking: Reported on 12/25/2023)     dexamethasone (DECADRON) 4 MG tablet Take 10 tablets (40 mg total) by mouth once a week. Take once weekly on the day you receive the chemotherapy injection. 40 tablet 5   ELDERBERRY PO Take 1 tablet by mouth daily.     Elderberry-Vitamin C-Zinc (ELDERBERRY IMMUNE HEALTH GUMMY PO) Take by mouth daily.     fluticasone (FLONASE) 50 MCG/ACT nasal spray Place into the nose as needed.     ondansetron (ZOFRAN) 8 MG tablet Take 1 tablet (8 mg total) by mouth every 8 (eight) hours as needed. 30 tablet 0   prochlorperazine (COMPAZINE) 10 MG tablet Take 1 tablet (10 mg total) by mouth every 6 (six) hours as needed for nausea or vomiting. 30 tablet 0   rosuvastatin (CRESTOR) 5 MG tablet Take 5 mg by mouth daily.     valACYclovir (VALTREX) 500 MG tablet TAKE 4 TABLETS BY MOUTH EVERY 12 HOURS X 1 DAY FOR FEVER BLISTER AS NEEDED. 30 tablet 2   No current facility-administered medications for this visit.    REVIEW OF SYSTEMS:   Constitutional: ( - ) fevers, ( - )  chills , ( - ) night sweats Eyes: ( - ) blurriness of vision, ( - ) double vision, ( - ) watery eyes Ears, nose, mouth, throat, and face: ( - ) mucositis, ( - ) sore throat Respiratory: ( - ) cough, ( - ) dyspnea, ( - ) wheezes Cardiovascular: ( - ) palpitation, ( - ) chest discomfort, ( - ) lower extremity swelling Gastrointestinal:  ( - ) nausea, ( - ) heartburn, ( - ) change in bowel habits Skin: (  - ) abnormal skin rashes Lymphatics: ( - ) new lymphadenopathy, ( - ) easy bruising Neurological: ( - ) numbness, ( - ) tingling, ( - ) new weaknesses Behavioral/Psych: ( - ) mood change, ( - ) new changes  All other systems were reviewed with the patient and are negative.  PHYSICAL EXAMINATION: ECOG PERFORMANCE STATUS: 0 - Asymptomatic  Vitals:   12/25/23 0934  BP: (!) 150/76  Pulse: 80  Resp: 15  Temp: 98.4 F (36.9 C)  SpO2: 98%      Filed Weights   12/25/23 0934  Weight: 143 lb 3.2 oz (65 kg)     GENERAL: Well-appearing elderly Caucasian female, alert, no distress and comfortable SKIN: skin color, texture, turgor are normal, no rashes or significant lesions EYES: conjunctiva are pink and non-injected, sclera clear LUNGS: clear to auscultation and percussion with normal breathing effort HEART: regular rate & rhythm and no murmurs and no lower extremity edema Musculoskeletal: no cyanosis of digits and no clubbing  PSYCH: alert & oriented x 3, fluent speech NEURO: no focal motor/sensory deficits  LABORATORY DATA:  I have reviewed the data as listed    Latest Ref Rng & Units 12/25/2023    8:51 AM 12/17/2023    1:39 PM 12/10/2023    9:40 AM  CBC  WBC 4.0 - 10.5 K/uL 7.4  5.4  6.1   Hemoglobin 12.0 - 15.0 g/dL 51.8  84.1  66.0   Hematocrit 36.0 - 46.0 % 44.0  44.8  43.0   Platelets 150 - 400 K/uL 159  147  141        Latest Ref Rng & Units 12/25/2023    8:51 AM 12/17/2023    1:39 PM 12/10/2023    9:40 AM  CMP  Glucose 70 - 99 mg/dL 621  308  88   BUN 8 - 23 mg/dL 15  20  14    Creatinine 0.44 - 1.00 mg/dL 6.57  8.46  9.62   Sodium 135 - 145 mmol/L 144  140  145   Potassium 3.5 - 5.1 mmol/L 3.7  4.1  3.8   Chloride 98 - 111 mmol/L 109  107  108   CO2 22 - 32 mmol/L 30  27  34   Calcium 8.9 - 10.3 mg/dL 8.7  9.3  8.9   Total Protein 6.5 - 8.1 g/dL 5.8  6.5  5.9   Total Bilirubin 0.0 - 1.2 mg/dL 1.3  1.2  1.1   Alkaline Phos 38 - 126 U/L 97  108  93   AST 15 - 41  U/L 19  20  23    ALT 0 - 44 U/L 19  21  21      Lab Results  Component Value Date   MPROTEIN 0.2 (H) 11/26/2023   MPROTEIN 0.1 (H) 10/29/2023   MPROTEIN 0.1 (H) 10/01/2023   Lab Results  Component Value Date   KPAFRELGTCHN 3.2 (L) 11/26/2023   KPAFRELGTCHN 3.4 10/29/2023   KPAFRELGTCHN 3.3 10/01/2023   LAMBDASER 79.0 (H) 11/26/2023   LAMBDASER 63.0 (H) 10/29/2023   LAMBDASER 64.3 (H) 10/01/2023   KAPLAMBRATIO 0.04 (L) 11/26/2023   KAPLAMBRATIO 0.05 (L) 10/29/2023   KAPLAMBRATIO 0.05 (L) 10/01/2023    RADIOGRAPHIC STUDIES: No results found.  ASSESSMENT & PLAN Brooke Whitaker 73 y.o. female with medical history significant for newly diagnosed multiple myeloma who presents for a follow up visit.   Previously we discussed the diagnosis of multiple myeloma.  We discussed that this is a blood cancer that is not curable.  The goal is to get the patient into a durable remission with treatment.  We discussed the need for a minimum of 6 months of chemotherapy treatment then followed by maintenance therapy if the M protein has reached 0 and the free light chains are within normal limits.  We discussed expected side effects of the regimen and the plan moving forward.  The patient voiced her understanding of our findings, expected side effects, and risks/benefits.  She is okay with proceeding with chemotherapy at this time.  # Free Lambda Multiple Myeloma -- Diagnosis confirmed by the presence of a macrocytic anemia and 80% plasma cells in the bone marrow --Initially started with VRD chemotherapy with the patient developed rash as result of the Revlimid and ocular toxicity from the Velcade.  Dropped the Revlimid down to 10 mg p.o. daily and transition to Darzalex for her next treatment. --UPEP showed marked Bence Jones proteins in the urine, no evidence of lytic lesions on metastatic survey PLAN: -- Plan to hold Dara/Rev/Dex and start Kyprolis monotherapy next week. --Labs today show white  blood cell 7.4, hemoglobin 14.8, MCV 92.8, platelets 159 --On 11/26/2023 patient had kappa 3.2, Lambda 79.0, Ratio 0.04  -- Plan for return to clinic next week for continued Kyprolis therapy.  #Supportive Care -- chemotherapy education complete -- port placement requested by patient.  -- ASA 81 mg PO daily to be taken with revlimid.  Can be discontinued after Revlimid is stopped. -- zofran 8mg  q8H PRN and compazine 10mg  PO q6H for nausea -- acyclovir 400mg  PO BID for VCZ prophylaxis -- EMLA cream for port --Will discuss the need for Zometa treatment after symptoms of her regimen have stabilized. -- no pain medication required at  this time.    No orders of the defined types were placed in this encounter.   All questions were answered. The patient knows to call the clinic with any problems, questions or concerns.  A total of more than 30 minutes were spent on this encounter with face-to-face time and non-face-to-face time, including preparing to see the patient, ordering tests and/or medications, counseling the patient and coordination of care as outlined above.   Ulysees Barns, MD Department of Hematology/Oncology [facility] Phone: [phone] Pager: [phone] Email: [email or web address]  12/30/2023 11:55 AM

## 2023-12-25 NOTE — Progress Notes (Signed)
Patient took steroid at home prior to treatment.

## 2023-12-26 ENCOUNTER — Other Ambulatory Visit: Payer: Self-pay | Admitting: *Deleted

## 2023-12-26 ENCOUNTER — Encounter: Payer: Self-pay | Admitting: Hematology and Oncology

## 2023-12-26 DIAGNOSIS — C9000 Multiple myeloma not having achieved remission: Secondary | ICD-10-CM

## 2023-12-30 ENCOUNTER — Encounter: Payer: Self-pay | Admitting: Hematology and Oncology

## 2023-12-31 ENCOUNTER — Inpatient Hospital Stay: Payer: HMO

## 2023-12-31 VITALS — BP 162/64 | HR 74 | Temp 98.5°F | Resp 18 | Wt 141.4 lb

## 2023-12-31 DIAGNOSIS — C9000 Multiple myeloma not having achieved remission: Secondary | ICD-10-CM

## 2023-12-31 DIAGNOSIS — Z5112 Encounter for antineoplastic immunotherapy: Secondary | ICD-10-CM | POA: Diagnosis not present

## 2023-12-31 LAB — CMP (CANCER CENTER ONLY)
ALT: 24 U/L (ref 0–44)
AST: 21 U/L (ref 15–41)
Albumin: 4.2 g/dL (ref 3.5–5.0)
Alkaline Phosphatase: 86 U/L (ref 38–126)
Anion gap: 3 — ABNORMAL LOW (ref 5–15)
BUN: 15 mg/dL (ref 8–23)
CO2: 31 mmol/L (ref 22–32)
Calcium: 9.1 mg/dL (ref 8.9–10.3)
Chloride: 108 mmol/L (ref 98–111)
Creatinine: 0.58 mg/dL (ref 0.44–1.00)
GFR, Estimated: >60 mL/min (ref >60)
Glucose, Bld: 94 mg/dL (ref 70–99)
Potassium: 4.8 mmol/L (ref 3.5–5.1)
Sodium: 142 mmol/L (ref 135–145)
Total Bilirubin: 1.4 mg/dL — ABNORMAL HIGH (ref 0.0–1.2)
Total Protein: 6.1 g/dL — ABNORMAL LOW (ref 6.5–8.1)

## 2023-12-31 LAB — CBC WITH DIFFERENTIAL (CANCER CENTER ONLY)
Abs Immature Granulocytes: 0.02 10*3/uL (ref 0.00–0.07)
Basophils Absolute: 0 10*3/uL (ref 0.0–0.1)
Basophils Relative: 0 %
Eosinophils Absolute: 0 10*3/uL (ref 0.0–0.5)
Eosinophils Relative: 0 %
HCT: 43.5 % (ref 36.0–46.0)
Hemoglobin: 14.7 g/dL (ref 12.0–15.0)
Immature Granulocytes: 0 %
Lymphocytes Relative: 19 %
Lymphs Abs: 1.3 10*3/uL (ref 0.7–4.0)
MCH: 30.9 pg (ref 26.0–34.0)
MCHC: 33.8 g/dL (ref 30.0–36.0)
MCV: 91.6 fL (ref 80.0–100.0)
Monocytes Absolute: 0.2 10*3/uL (ref 0.1–1.0)
Monocytes Relative: 3 %
Neutro Abs: 5.4 10*3/uL (ref 1.7–7.7)
Neutrophils Relative %: 78 %
Platelet Count: 109 10*3/uL — ABNORMAL LOW (ref 150–400)
RBC: 4.75 MIL/uL (ref 3.87–5.11)
RDW: 14.5 % (ref 11.5–15.5)
WBC Count: 7 10*3/uL (ref 4.0–10.5)
nRBC: 0 % (ref 0.0–0.2)

## 2023-12-31 MED ORDER — SODIUM CHLORIDE 0.9 % IV SOLN: Freq: Once | INTRAVENOUS | Status: AC

## 2023-12-31 MED ORDER — DEXTROSE 5 % IV SOLN
70.0000 mg/m2 | Freq: Once | INTRAVENOUS | Status: AC
Start: 2023-12-31 — End: 2023-12-31
  Administered 2023-12-31: 120 mg via INTRAVENOUS
  Filled 2023-12-31: qty 60

## 2023-12-31 MED ORDER — SODIUM CHLORIDE 0.9 % IV SOLN: INTRAVENOUS | Status: DC

## 2023-12-31 MED ORDER — HEPARIN SOD (PORK) LOCK FLUSH 100 UNIT/ML IV SOLN
500.0000 [IU] | Freq: Once | INTRAVENOUS | Status: DC | PRN
Start: 2023-12-31 — End: 2023-12-31

## 2023-12-31 MED ORDER — SODIUM CHLORIDE 0.9% FLUSH: 10.0000 mL | INTRAVENOUS | Status: DC | PRN

## 2023-12-31 NOTE — Progress Notes (Signed)
 Pt states she took decadron at home prior to arriving for infusion today.

## 2023-12-31 NOTE — Patient Instructions (Signed)

## 2024-01-02 ENCOUNTER — Inpatient Hospital Stay

## 2024-01-02 NOTE — Progress Notes (Signed)
 [facility] CSW Counseling Note  Patient was referred by self. Treatment type: Individual  Presenting Concerns: Patient and/or family reports the following symptoms/concerns:  Adjustment Concerns  Duration of problem: 2 weeks; Severity of problem: mild   Orientation:oriented to person, place, and time/date.   Affect: Congruent Risk of harm to self or others: No plan to harm self or others  Patient and/or Family's Strengths/Protective Factors: Social connections, Social and Emotional competence, Concrete supports in place (healthy food, safe environments, etc.), and Physical Health (exercise, healthy diet, medication compliance, etc.)Ability for insight  Active sense of humor  Average or above average intelligence  Capable of independent living  Communication skills  General fund of knowledge  Motivation for treatment/growth  Supportive family/friends      Goals Addressed: Patient will: Increase healthy adjustment to current life circumstances   Progress towards Goals: Initial   Interventions: Interventions utilized:  Other: Initial Assessment        Assessment: Patient currently experiencing challenges with adjusting to treatment changes. Patient has been in treatment with Myeloma for several months and was told by provider in March of this year that treatment will be indefinite. Patient reported difficulties with processing the treatment changes and limited opportunities to discuss with family.  Patient desires an opportunity to adjust and process news and hopes to feel "better".       Plan: Follow up with CSW: 2 weeks       Marguerita Merles, [place]

## 2024-01-03 ENCOUNTER — Other Ambulatory Visit: Payer: Self-pay | Admitting: Hematology and Oncology

## 2024-01-03 ENCOUNTER — Other Ambulatory Visit: Payer: Self-pay | Admitting: *Deleted

## 2024-01-03 ENCOUNTER — Telehealth: Payer: Self-pay | Admitting: Hematology and Oncology

## 2024-01-03 DIAGNOSIS — C9000 Multiple myeloma not having achieved remission: Secondary | ICD-10-CM

## 2024-01-07 ENCOUNTER — Encounter: Payer: Self-pay | Admitting: Hematology and Oncology

## 2024-01-10 ENCOUNTER — Other Ambulatory Visit (HOSPITAL_COMMUNITY)

## 2024-01-10 ENCOUNTER — Ambulatory Visit (HOSPITAL_COMMUNITY)

## 2024-01-14 ENCOUNTER — Inpatient Hospital Stay

## 2024-01-14 ENCOUNTER — Other Ambulatory Visit

## 2024-01-14 ENCOUNTER — Other Ambulatory Visit: Payer: Self-pay

## 2024-01-14 ENCOUNTER — Inpatient Hospital Stay (HOSPITAL_BASED_OUTPATIENT_CLINIC_OR_DEPARTMENT_OTHER): Admitting: Hematology and Oncology

## 2024-01-14 VITALS — BP 121/63 | HR 85 | Temp 98.0°F | Resp 17 | Wt 142.5 lb

## 2024-01-14 DIAGNOSIS — C9000 Multiple myeloma not having achieved remission: Secondary | ICD-10-CM

## 2024-01-14 DIAGNOSIS — Z5112 Encounter for antineoplastic immunotherapy: Secondary | ICD-10-CM | POA: Diagnosis not present

## 2024-01-14 LAB — CBC WITH DIFFERENTIAL (CANCER CENTER ONLY)
Abs Immature Granulocytes: 0.04 10*3/uL (ref 0.00–0.07)
Basophils Absolute: 0 10*3/uL (ref 0.0–0.1)
Basophils Relative: 0 %
Eosinophils Absolute: 0 10*3/uL (ref 0.0–0.5)
Eosinophils Relative: 0 %
HCT: 42.6 % (ref 36.0–46.0)
Hemoglobin: 14.5 g/dL (ref 12.0–15.0)
Immature Granulocytes: 0 %
Lymphocytes Relative: 10 %
Lymphs Abs: 1 10*3/uL (ref 0.7–4.0)
MCH: 31.3 pg (ref 26.0–34.0)
MCHC: 34 g/dL (ref 30.0–36.0)
MCV: 92 fL (ref 80.0–100.0)
Monocytes Absolute: 0.1 10*3/uL (ref 0.1–1.0)
Monocytes Relative: 1 %
Neutro Abs: 8.6 10*3/uL — ABNORMAL HIGH (ref 1.7–7.7)
Neutrophils Relative %: 89 %
Platelet Count: 240 10*3/uL (ref 150–400)
RBC: 4.63 MIL/uL (ref 3.87–5.11)
RDW: 14.8 % (ref 11.5–15.5)
WBC Count: 9.7 10*3/uL (ref 4.0–10.5)
nRBC: 0 % (ref 0.0–0.2)

## 2024-01-14 LAB — CMP (CANCER CENTER ONLY)
ALT: 16 U/L (ref 0–44)
AST: 17 U/L (ref 15–41)
Albumin: 4.4 g/dL (ref 3.5–5.0)
Alkaline Phosphatase: 92 U/L (ref 38–126)
Anion gap: 8 (ref 5–15)
BUN: 17 mg/dL (ref 8–23)
CO2: 26 mmol/L (ref 22–32)
Calcium: 9.2 mg/dL (ref 8.9–10.3)
Chloride: 107 mmol/L (ref 98–111)
Creatinine: 0.63 mg/dL (ref 0.44–1.00)
GFR, Estimated: >60 mL/min (ref >60)
Glucose, Bld: 156 mg/dL — ABNORMAL HIGH (ref 70–99)
Potassium: 4 mmol/L (ref 3.5–5.1)
Sodium: 141 mmol/L (ref 135–145)
Total Bilirubin: 1.4 mg/dL — ABNORMAL HIGH (ref 0.0–1.2)
Total Protein: 6.3 g/dL — ABNORMAL LOW (ref 6.5–8.1)

## 2024-01-14 MED ORDER — SODIUM CHLORIDE 0.9 % IV SOLN: Freq: Once | INTRAVENOUS | Status: AC

## 2024-01-14 MED ORDER — SODIUM CHLORIDE 0.9 % IV SOLN: INTRAVENOUS | Status: DC

## 2024-01-14 MED ORDER — DEXTROSE 5 % IV SOLN
70.0000 mg/m2 | Freq: Once | INTRAVENOUS | Status: AC
  Administered 2024-01-14: 120 mg via INTRAVENOUS
  Filled 2024-01-14: qty 60

## 2024-01-14 NOTE — Patient Instructions (Signed)

## 2024-01-15 ENCOUNTER — Encounter: Payer: Self-pay | Admitting: Hematology and Oncology

## 2024-01-15 NOTE — Progress Notes (Signed)
 [facility] [phone]   [phone]  PROGRESS NOTE  Patient Care Team: Rodrigo Ran, MD as PCP - General (Internal Medicine)  Hematological/Oncological History # Free Lambda Multiple Myeloma 11/10/2022: WBC 5.68, Hgb 11.7, MCV 97.4, Plt 146 04/17/2023: WBC 4.53, Hgb 11.5, MCV 107.6, Plt 110 05/16/2023: establish care with Dr. Leonides Schanz. Labs showed M protein 0.1 with Lambda 900, Kappa 3 with ratio 0.00.   06/01/2023: bone marrow biopsy performed, showed plasma cell myeloma nearly effacing approximately 80% of the cellular marrow.  06/25/2023: Cycle 1 Day 1 of VRD chemotherapy 07/23/2023: Cycle 2 Day 1 of VRD chemotherapy 08/13/2023: Cycle 3 Day 1 of VRD chemotherapy 09/03/2023: Cycle 1 Day 1 of Dara/Rev/Dex. Transitioned off velcade due to ocular toxicity. 12/17/2023: Cycle 1 Day 1 of Kyprolis. Dara/Rev ineffective.   Interval History:  Brooke Whitaker 73 y.o. female with medical history significant for newly diagnosed multiple myeloma who presents for a follow up visit. The patient's last visit was on 12/25/2023. In the interim since the last visit she has had no major changes in her health.  On exam today Mrs. Stenseth reports her treatments have been going well so far.  She is due for her fourth treatment today and has completed a total of 3.  She reports that the day of treatment she does have some trouble sleeping.  She also prone to headaches.  She notes that it is typically on top of her head and down to her sinuses.  She notes that she is not having any trouble with nausea, vomiting, or diarrhea.  Reports that her bowels are moving regular.  She reports that she is having some occasional allergies due to the pollen but overall is at her baseline level of health.. She denies any fevers, chills, sweats, nausea, vomiting, or diarrhea.  A full 10 point ROS is otherwise negative.  Previously we discussed the results of her serum free light chains.  We discussed that  her lambda is rising despite optimal treatment with Dara/rev/Dex.  We noted that this increase has begun to pick up momentum and that she has not dropped any further since we transition her off the Velcade therapy.  Given this I recommend that we transition to Kyprolis.  We discussed the risks and benefits of this treatment and she voiced understanding and was willing and able to proceed.  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    Sigmoid diverticulosis    Wears glasses     SURGICAL HISTORY: Past Surgical History:  Procedure Laterality Date   BREAST BIOPSY Right 09/2011   benign    COLONOSCOPY  2010   CYSTOSCOPY/RETROGRADE/URETEROSCOPY/STONE EXTRACTION WITH BASKET Left 04/21/2014   Procedure: cystoscopy, left retrograde pylegram, LEFT URETEROSCOPY, laser lithotripsy with holmium laser, STONE EXTRACTION, uretheral calibration;  Surgeon: Anner Crete, MD;  Location: [facility];  Service: Urology;  Laterality: Left;   DILATION AND EVACUATION  1995   EPISIOTOMY REPAIR, SEVERE TEAR  1989   LAPAROSCOPIC OVARIAN CYSTECTOMY  10/ 2000   NM MYOCAR PERF WALL MOTION  12/15/2009   Normal   US ECHOCARDIOGRAPHY  12/15/2009   Normal study for age: mild MR,TR and trace PI    SOCIAL HISTORY: Social History   Socioeconomic History  Marital status: Married    Spouse name: Not on file   Number of children: 2   Years of education: Not on file   Highest education level: Not on file  Occupational History   Occupation: Print production planner  Tobacco Use   Smoking status: Never    Passive exposure: Never   Smokeless tobacco: Never  Vaping Use   Vaping status: Never Used  Substance and Sexual Activity   Alcohol use: Yes    Alcohol/week: 2.0 standard drinks of alcohol    Types: 2 Glasses of wine per week    Drug use: No   Sexual activity: Not Currently    Partners: Male  Other Topics Concern   Not on file  Social History Narrative   Not on file   Social Drivers of Health   Financial Resource Strain: Not on file  Food Insecurity: No Food Insecurity (05/16/2023)   Hunger Vital Sign    Worried About Running Out of Food in the Last Year: Never true    Ran Out of Food in the Last Year: Never true  Transportation Needs: No Transportation Needs (05/16/2023)   PRAPARE - Administrator, Civil Service (Medical): No    Lack of Transportation (Non-Medical): No  Physical Activity: Not on file  Stress: Not on file  Social Connections: Not on file  Intimate Partner Violence: Not At Risk (05/16/2023)   Humiliation, Afraid, Rape, and Kick questionnaire    Fear of Current or Ex-Partner: No    Emotionally Abused: No    Physically Abused: No    Sexually Abused: No    FAMILY HISTORY: Family History  Problem Relation Age of Onset   Diabetes Mother    Congestive Heart Failure Mother    Stroke Mother    Osteoporosis Mother    Other Mother        gallbladder ruptured   Heart attack Father    Diabetes Sister    Heart disease Sister    Heart disease Sister        x2   Hypertension Sister    Thyroid disease Sister    Hypertension Sister    Diabetes Brother    Dementia Brother    Colon cancer Brother        mets to liver   Heart disease Brother        pacemaker   Stroke Brother    Liver cancer Brother    Heart disease Brother    Stomach cancer Neg Hx    Pancreatic cancer Neg Hx    Esophageal cancer Neg Hx     ALLERGIES:  is allergic to penicillins and sulfa antibiotics.  MEDICATIONS:  Current Outpatient Medications  Medication Sig Dispense Refill   acetaminophen (TYLENOL) 500 MG tablet Take 500 mg by mouth every 6 (six) hours as needed for mild pain or headache.      acyclovir (ZOVIRAX) 400 MG tablet TAKE 1 TABLET BY MOUTH TWICE A DAY (Patient not taking: Reported on  12/25/2023) 180 tablet 1   allopurinol (ZYLOPRIM) 300 MG tablet Take 1 tablet (300 mg total) by mouth daily. (Patient not taking: Reported on 12/25/2023) 90 tablet 1   aspirin EC 81 MG tablet Take 81 mg by mouth daily. Swallow whole. (Patient not taking: Reported on 12/25/2023)     cetirizine (ZYRTEC) 5 MG tablet Take 5 mg by mouth daily as needed for allergies.     cholecalciferol (VITAMIN D) 1000 UNITS tablet Take 1,000 Units by mouth every  morning.      Cyanocobalamin (VITAMIN B 12 PO) Take 1,000 mcg by mouth daily.     Daratumumab-Hyaluronidase-fihj (DARZALEX FASPRO [place]) Inject into the skin once a week. Every Monday (Patient not taking: Reported on 12/25/2023)     dexamethasone (DECADRON) 4 MG tablet Take 10 tablets (40 mg total) by mouth once a week. Take once weekly on the day you receive the chemotherapy injection. 40 tablet 5   DOXYCYCLINE PO Take by mouth.     ELDERBERRY PO Take 1 tablet by mouth daily.     Elderberry-Vitamin C-Zinc (ELDERBERRY IMMUNE HEALTH GUMMY PO) Take by mouth daily.     fluticasone (FLONASE) 50 MCG/ACT nasal spray Place into the nose as needed.     ondansetron (ZOFRAN) 8 MG tablet Take 1 tablet (8 mg total) by mouth every 8 (eight) hours as needed. 30 tablet 0   prochlorperazine (COMPAZINE) 10 MG tablet Take 1 tablet (10 mg total) by mouth every 6 (six) hours as needed for nausea or vomiting. 30 tablet 0   rosuvastatin (CRESTOR) 5 MG tablet Take 5 mg by mouth daily.     valACYclovir (VALTREX) 500 MG tablet TAKE 4 TABLETS BY MOUTH EVERY 12 HOURS X 1 DAY FOR FEVER BLISTER AS NEEDED. 30 tablet 2   No current facility-administered medications for this visit.    REVIEW OF SYSTEMS:   Constitutional: ( - ) fevers, ( - )  chills , ( - ) night sweats Eyes: ( - ) blurriness of vision, ( - ) double vision, ( - ) watery eyes Ears, nose, mouth, throat, and face: ( - ) mucositis, ( - ) sore throat Respiratory: ( - ) cough, ( - ) dyspnea, ( - ) wheezes Cardiovascular: ( - )  palpitation, ( - ) chest discomfort, ( - ) lower extremity swelling Gastrointestinal:  ( - ) nausea, ( - ) heartburn, ( - ) change in bowel habits Skin: ( - ) abnormal skin rashes Lymphatics: ( - ) new lymphadenopathy, ( - ) easy bruising Neurological: ( - ) numbness, ( - ) tingling, ( - ) new weaknesses Behavioral/Psych: ( - ) mood change, ( - ) new changes  All other systems were reviewed with the patient and are negative.  PHYSICAL EXAMINATION: ECOG PERFORMANCE STATUS: 0 - Asymptomatic  There were no vitals filed for this visit.     There were no vitals filed for this visit.    GENERAL: Well-appearing elderly Caucasian female, alert, no distress and comfortable SKIN: skin color, texture, turgor are normal, no rashes or significant lesions EYES: conjunctiva are pink and non-injected, sclera clear LUNGS: clear to auscultation and percussion with normal breathing effort HEART: regular rate & rhythm and no murmurs and no lower extremity edema Musculoskeletal: no cyanosis of digits and no clubbing  PSYCH: alert & oriented x 3, fluent speech NEURO: no focal motor/sensory deficits  LABORATORY DATA:  I have reviewed the data as listed    Latest Ref Rng & Units 01/14/2024    1:18 PM 12/31/2023   12:29 PM 12/25/2023    8:51 AM  CBC  WBC 4.0 - 10.5 K/uL 9.7  7.0  7.4   Hemoglobin 12.0 - 15.0 g/dL 16.1  09.6  04.5   Hematocrit 36.0 - 46.0 % 42.6  43.5  44.0   Platelets 150 - 400 K/uL 240  109  159        Latest Ref Rng & Units 01/14/2024    1:18 PM 12/31/2023  12:29 PM 12/25/2023    8:51 AM  CMP  Glucose 70 - 99 mg/dL 409  94  811   BUN 8 - 23 mg/dL 17  15  15    Creatinine 0.44 - 1.00 mg/dL 9.14  7.82  9.56   Sodium 135 - 145 mmol/L 141  142  144   Potassium 3.5 - 5.1 mmol/L 4.0  4.8  3.7   Chloride 98 - 111 mmol/L 107  108  109   CO2 22 - 32 mmol/L 26  31  30    Calcium 8.9 - 10.3 mg/dL 9.2  9.1  8.7   Total Protein 6.5 - 8.1 g/dL 6.3  6.1  5.8   Total Bilirubin 0.0 - 1.2  mg/dL 1.4  1.4  1.3   Alkaline Phos 38 - 126 U/L 92  86  97   AST 15 - 41 U/L 17  21  19    ALT 0 - 44 U/L 16  24  19      Lab Results  Component Value Date   MPROTEIN 0.2 (H) 11/26/2023   MPROTEIN 0.1 (H) 10/29/2023   MPROTEIN 0.1 (H) 10/01/2023   Lab Results  Component Value Date   KPAFRELGTCHN 3.2 (L) 11/26/2023   KPAFRELGTCHN 3.4 10/29/2023   KPAFRELGTCHN 3.3 10/01/2023   LAMBDASER 79.0 (H) 11/26/2023   LAMBDASER 63.0 (H) 10/29/2023   LAMBDASER 64.3 (H) 10/01/2023   KAPLAMBRATIO 0.04 (L) 11/26/2023   KAPLAMBRATIO 0.05 (L) 10/29/2023   KAPLAMBRATIO 0.05 (L) 10/01/2023    RADIOGRAPHIC STUDIES: No results found.  ASSESSMENT & PLAN Brooke Whitaker 73 y.o. female with medical history significant for newly diagnosed multiple myeloma who presents for a follow up visit.   Previously we discussed the diagnosis of multiple myeloma.  We discussed that this is a blood cancer that is not curable.  The goal is to get the patient into a durable remission with treatment.  We discussed the need for a minimum of 6 months of chemotherapy treatment then followed by maintenance therapy if the M protein has reached 0 and the free light chains are within normal limits.  We discussed expected side effects of the regimen and the plan moving forward.  The patient voiced her understanding of our findings, expected side effects, and risks/benefits.  She is okay with proceeding with chemotherapy at this time.  # Free Lambda Multiple Myeloma -- Diagnosis confirmed by the presence of a macrocytic anemia and 80% plasma cells in the bone marrow --Initially started with VRD chemotherapy with the patient developed rash as result of the Revlimid and ocular toxicity from the Velcade.  Dropped the Revlimid down to 10 mg p.o. daily and transition to Darzalex for her next treatment. --UPEP showed marked Bence Jones proteins in the urine, no evidence of lytic lesions on metastatic survey PLAN: -- Plan to hold  Dara/Rev/Dex and start Kyprolis monotherapy next week. --Labs today show white blood cell 9.7, hemoglobin 14.5, MCV 92, platelets 240 --On 11/26/2023 patient had kappa 3.2, Lambda 79.0, Ratio 0.04  -- Plan for return to clinic in 2 weeks with interval continued Kyprolis therapy.  #Supportive Care -- chemotherapy education complete -- port placement requested by patient.  -- ASA 81 mg PO daily to be taken with revlimid.  Can be discontinued after Revlimid is stopped. -- zofran 8mg  q8H PRN and compazine 10mg  PO q6H for nausea -- acyclovir 400mg  PO BID for VCZ prophylaxis -- EMLA cream for port --Will discuss the need for Zometa treatment after symptoms  of her regimen have stabilized. -- no pain medication required at this time.    Orders Placed This Encounter  Procedures   Multiple Myeloma Panel (SPEP&IFE w/QIG)    Standing Status:   Future    Expected Date:   01/22/2024    Expiration Date:   01/21/2025   Kappa/lambda light chains    Standing Status:   Future    Expected Date:   01/22/2024    Expiration Date:   01/21/2025    All questions were answered. The patient knows to call the clinic with any problems, questions or concerns.  A total of more than 30 minutes were spent on this encounter with face-to-face time and non-face-to-face time, including preparing to see the patient, ordering tests and/or medications, counseling the patient and coordination of care as outlined above.   Ulysees Barns, MD Department of Hematology/Oncology [facility] Phone: [phone] Pager: [phone] Email: [email or web address]  01/15/2024 2:46 PM

## 2024-01-16 ENCOUNTER — Inpatient Hospital Stay: Attending: Hematology and Oncology

## 2024-01-16 DIAGNOSIS — C9000 Multiple myeloma not having achieved remission: Secondary | ICD-10-CM | POA: Insufficient documentation

## 2024-01-16 DIAGNOSIS — Z5112 Encounter for antineoplastic immunotherapy: Secondary | ICD-10-CM | POA: Insufficient documentation

## 2024-01-16 DIAGNOSIS — Z79899 Other long term (current) drug therapy: Secondary | ICD-10-CM | POA: Insufficient documentation

## 2024-01-16 NOTE — Progress Notes (Signed)
 [facility] CSW Counseling Note  Patient was referred by self. Treatment type: Individual  Presenting Concerns: Patient and/or family reports the following symptoms/concerns:  Adjustment Concerns  Duration of problem: 2 weeks; Severity of problem: mild   Orientation:oriented to person, place, and time/date.   Affect: Congruent Risk of harm to self or others: No plan to harm self or others  Patient and/or Family's Strengths/Protective Factors: Social connections, Social and Emotional competence, Concrete supports in place (healthy food, safe environments, etc.), and Physical Health (exercise, healthy diet, medication compliance, etc.)Ability for insight  Active sense of humor  Average or above average intelligence  Capable of independent living  Communication skills  General fund of knowledge  Motivation for treatment/growth  Supportive family/friends      Goals Addressed: Patient will: Increase healthy adjustment to current life circumstances   Progress towards Goals: Initial   Interventions: Interventions utilized: CBT  / Person Centered.      Assessment: Patient currently experiencing challenges with adjusting to treatment changes. Patient has been in treatment with Myeloma for several months and was told by provider in March of this year that treatment will be indefinite. Patient reported difficulties with processing the treatment changes and limited opportunities to discuss with family.  Patient desires an opportunity to adjust and process news and hopes to feel "better".In addition to presenting concerns, patient is processing mother-in-law entering hospice and discussed their relationship. Patient discussed how this event impacts thoughts around mortality.     Plan: Follow up with CSW: 3 weeks       Marguerita Merles, [place]

## 2024-01-21 ENCOUNTER — Inpatient Hospital Stay

## 2024-01-21 VITALS — BP 154/69 | HR 89 | Temp 98.6°F | Resp 17 | Wt 142.5 lb

## 2024-01-21 DIAGNOSIS — Z79899 Other long term (current) drug therapy: Secondary | ICD-10-CM | POA: Diagnosis not present

## 2024-01-21 DIAGNOSIS — C9000 Multiple myeloma not having achieved remission: Secondary | ICD-10-CM | POA: Diagnosis not present

## 2024-01-21 DIAGNOSIS — Z5112 Encounter for antineoplastic immunotherapy: Secondary | ICD-10-CM | POA: Diagnosis not present

## 2024-01-21 LAB — CBC WITH DIFFERENTIAL (CANCER CENTER ONLY)
Abs Immature Granulocytes: 0.04 10*3/uL (ref 0.00–0.07)
Basophils Absolute: 0 10*3/uL (ref 0.0–0.1)
Basophils Relative: 0 %
Eosinophils Absolute: 0 10*3/uL (ref 0.0–0.5)
Eosinophils Relative: 0 %
HCT: 42.1 % (ref 36.0–46.0)
Hemoglobin: 14.3 g/dL (ref 12.0–15.0)
Immature Granulocytes: 1 %
Lymphocytes Relative: 12 %
Lymphs Abs: 0.8 10*3/uL (ref 0.7–4.0)
MCH: 30.8 pg (ref 26.0–34.0)
MCHC: 34 g/dL (ref 30.0–36.0)
MCV: 90.7 fL (ref 80.0–100.0)
Monocytes Absolute: 0.1 10*3/uL (ref 0.1–1.0)
Monocytes Relative: 1 %
Neutro Abs: 5.6 10*3/uL (ref 1.7–7.7)
Neutrophils Relative %: 86 %
Platelet Count: 110 10*3/uL — ABNORMAL LOW (ref 150–400)
RBC: 4.64 MIL/uL (ref 3.87–5.11)
RDW: 14.6 % (ref 11.5–15.5)
WBC Count: 6.5 10*3/uL (ref 4.0–10.5)
nRBC: 0 % (ref 0.0–0.2)

## 2024-01-21 LAB — CMP (CANCER CENTER ONLY)
ALT: 23 U/L (ref 0–44)
AST: 20 U/L (ref 15–41)
Albumin: 4.5 g/dL (ref 3.5–5.0)
Alkaline Phosphatase: 89 U/L (ref 38–126)
Anion gap: 8 (ref 5–15)
BUN: 17 mg/dL (ref 8–23)
CO2: 26 mmol/L (ref 22–32)
Calcium: 9.3 mg/dL (ref 8.9–10.3)
Chloride: 107 mmol/L (ref 98–111)
Creatinine: 0.57 mg/dL (ref 0.44–1.00)
GFR, Estimated: >60 mL/min (ref >60)
Glucose, Bld: 167 mg/dL — ABNORMAL HIGH (ref 70–99)
Potassium: 3.9 mmol/L (ref 3.5–5.1)
Sodium: 141 mmol/L (ref 135–145)
Total Bilirubin: 1.4 mg/dL — ABNORMAL HIGH (ref 0.0–1.2)
Total Protein: 6.5 g/dL (ref 6.5–8.1)

## 2024-01-21 MED ORDER — DEXTROSE 5 % IV SOLN
70.0000 mg/m2 | Freq: Once | INTRAVENOUS | Status: AC
Start: 2024-01-21 — End: 2024-01-21
  Administered 2024-01-21: 120 mg via INTRAVENOUS
  Filled 2024-01-21: qty 60

## 2024-01-21 MED ORDER — SODIUM CHLORIDE 0.9 % IV SOLN: INTRAVENOUS | Status: DC

## 2024-01-21 MED ORDER — SODIUM CHLORIDE 0.9 % IV SOLN: Freq: Once | INTRAVENOUS | Status: AC

## 2024-01-21 NOTE — Progress Notes (Signed)
 Verified that patient took dex at home prior to arrival around 0930

## 2024-01-22 ENCOUNTER — Encounter: Payer: Self-pay | Admitting: Hematology and Oncology

## 2024-01-22 LAB — KAPPA/LAMBDA LIGHT CHAINS
Kappa free light chain: 1.2 mg/L — ABNORMAL LOW (ref 3.3–19.4)
Kappa, lambda light chain ratio: 0.03 — ABNORMAL LOW (ref 0.26–1.65)
Lambda free light chains: 41.5 mg/L — ABNORMAL HIGH (ref 5.7–26.3)

## 2024-01-23 ENCOUNTER — Other Ambulatory Visit: Payer: Self-pay | Admitting: *Deleted

## 2024-01-23 ENCOUNTER — Other Ambulatory Visit: Payer: Self-pay

## 2024-01-23 LAB — MULTIPLE MYELOMA PANEL, SERUM
Albumin SerPl Elph-Mcnc: 3.8 g/dL (ref 2.9–4.4)
Albumin/Glob SerPl: 1.7 (ref 0.7–1.7)
Alpha 1: 0.3 g/dL (ref 0.0–0.4)
Alpha2 Glob SerPl Elph-Mcnc: 0.8 g/dL (ref 0.4–1.0)
B-Globulin SerPl Elph-Mcnc: 0.9 g/dL (ref 0.7–1.3)
Gamma Glob SerPl Elph-Mcnc: 0.3 g/dL — ABNORMAL LOW (ref 0.4–1.8)
Globulin, Total: 2.3 g/dL (ref 2.2–3.9)
IgA: <5 mg/dL — ABNORMAL LOW (ref 64–422)
IgG (Immunoglobin G), Serum: 291 mg/dL — ABNORMAL LOW (ref 586–1602)
IgM (Immunoglobulin M), Srm: <5 mg/dL — ABNORMAL LOW (ref 26–217)
M Protein SerPl Elph-Mcnc: 0.1 g/dL — ABNORMAL HIGH
Total Protein ELP: 6.1 g/dL (ref 6.0–8.5)

## 2024-01-23 MED ORDER — DEXAMETHASONE 4 MG PO TABS: 40.0000 mg | ORAL_TABLET | ORAL | 5 refills | Status: DC

## 2024-01-23 MED ORDER — ACYCLOVIR 400 MG PO TABS: 400.0000 mg | ORAL_TABLET | Freq: Two times a day (BID) | ORAL | 1 refills | Status: DC

## 2024-01-24 ENCOUNTER — Ambulatory Visit (HOSPITAL_COMMUNITY): Attending: Internal Medicine

## 2024-01-24 DIAGNOSIS — I340 Nonrheumatic mitral (valve) insufficiency: Secondary | ICD-10-CM | POA: Diagnosis not present

## 2024-01-24 DIAGNOSIS — E785 Hyperlipidemia, unspecified: Secondary | ICD-10-CM | POA: Diagnosis not present

## 2024-01-24 DIAGNOSIS — C9000 Multiple myeloma not having achieved remission: Secondary | ICD-10-CM | POA: Insufficient documentation

## 2024-01-24 DIAGNOSIS — I429 Cardiomyopathy, unspecified: Secondary | ICD-10-CM | POA: Insufficient documentation

## 2024-01-24 LAB — ECHOCARDIOGRAM COMPLETE
Area-P 1/2: 3.24 cm2
S' Lateral: 2.6 cm

## 2024-01-25 NOTE — Telephone Encounter (Signed)
 Open error

## 2024-01-28 ENCOUNTER — Inpatient Hospital Stay

## 2024-01-28 ENCOUNTER — Inpatient Hospital Stay (HOSPITAL_BASED_OUTPATIENT_CLINIC_OR_DEPARTMENT_OTHER): Admitting: Hematology and Oncology

## 2024-01-28 VITALS — BP 148/72 | HR 85 | Temp 98.1°F | Resp 16 | Wt 141.5 lb

## 2024-01-28 DIAGNOSIS — C9000 Multiple myeloma not having achieved remission: Secondary | ICD-10-CM

## 2024-01-28 DIAGNOSIS — D696 Thrombocytopenia, unspecified: Secondary | ICD-10-CM

## 2024-01-28 DIAGNOSIS — D539 Nutritional anemia, unspecified: Secondary | ICD-10-CM | POA: Diagnosis not present

## 2024-01-28 DIAGNOSIS — Z5112 Encounter for antineoplastic immunotherapy: Secondary | ICD-10-CM | POA: Diagnosis not present

## 2024-01-28 LAB — CMP (CANCER CENTER ONLY)
ALT: 20 U/L (ref 0–44)
AST: 17 U/L (ref 15–41)
Albumin: 4.1 g/dL (ref 3.5–5.0)
Alkaline Phosphatase: 82 U/L (ref 38–126)
Anion gap: 5 (ref 5–15)
BUN: 17 mg/dL (ref 8–23)
CO2: 29 mmol/L (ref 22–32)
Calcium: 8.9 mg/dL (ref 8.9–10.3)
Chloride: 108 mmol/L (ref 98–111)
Creatinine: 0.66 mg/dL (ref 0.44–1.00)
GFR, Estimated: >60 mL/min (ref >60)
Glucose, Bld: 106 mg/dL — ABNORMAL HIGH (ref 70–99)
Potassium: 3.7 mmol/L (ref 3.5–5.1)
Sodium: 142 mmol/L (ref 135–145)
Total Bilirubin: 1.5 mg/dL — ABNORMAL HIGH (ref 0.0–1.2)
Total Protein: 5.8 g/dL — ABNORMAL LOW (ref 6.5–8.1)

## 2024-01-28 LAB — CBC WITH DIFFERENTIAL (CANCER CENTER ONLY)
Abs Immature Granulocytes: 0.02 10*3/uL (ref 0.00–0.07)
Basophils Absolute: 0 10*3/uL (ref 0.0–0.1)
Basophils Relative: 1 %
Eosinophils Absolute: 0.1 10*3/uL (ref 0.0–0.5)
Eosinophils Relative: 1 %
HCT: 40.8 % (ref 36.0–46.0)
Hemoglobin: 13.9 g/dL (ref 12.0–15.0)
Immature Granulocytes: 1 %
Lymphocytes Relative: 31 %
Lymphs Abs: 1.4 10*3/uL (ref 0.7–4.0)
MCH: 31.4 pg (ref 26.0–34.0)
MCHC: 34.1 g/dL (ref 30.0–36.0)
MCV: 92.1 fL (ref 80.0–100.0)
Monocytes Absolute: 0.5 10*3/uL (ref 0.1–1.0)
Monocytes Relative: 12 %
Neutro Abs: 2.4 10*3/uL (ref 1.7–7.7)
Neutrophils Relative %: 54 %
Platelet Count: 121 10*3/uL — ABNORMAL LOW (ref 150–400)
RBC: 4.43 MIL/uL (ref 3.87–5.11)
RDW: 14.6 % (ref 11.5–15.5)
WBC Count: 4.4 10*3/uL (ref 4.0–10.5)
nRBC: 0 % (ref 0.0–0.2)

## 2024-01-28 MED ORDER — DEXTROSE 5 % IV SOLN
70.0000 mg/m2 | Freq: Once | INTRAVENOUS | Status: AC
  Administered 2024-01-28: 120 mg via INTRAVENOUS
  Filled 2024-01-28: qty 60

## 2024-01-28 MED ORDER — SODIUM CHLORIDE 0.9 % IV SOLN: Freq: Once | INTRAVENOUS | Status: AC

## 2024-01-28 MED ORDER — SODIUM CHLORIDE 0.9 % IV SOLN: INTRAVENOUS | Status: DC

## 2024-01-28 NOTE — Progress Notes (Signed)
 [facility] [phone]   [phone]  PROGRESS NOTE  Patient Care Team: Rodrigo Ran, MD as PCP - General (Internal Medicine)  Hematological/Oncological History # Free Lambda Multiple Myeloma 11/10/2022: WBC 5.68, Hgb 11.7, MCV 97.4, Plt 146 04/17/2023: WBC 4.53, Hgb 11.5, MCV 107.6, Plt 110 05/16/2023: establish care with Dr. Leonides Schanz. Labs showed M protein 0.1 with Lambda 900, Kappa 3 with ratio 0.00.   06/01/2023: bone marrow biopsy performed, showed plasma cell myeloma nearly effacing approximately 80% of the cellular marrow.  06/25/2023: Cycle 1 Day 1 of VRD chemotherapy 07/23/2023: Cycle 2 Day 1 of VRD chemotherapy 08/13/2023: Cycle 3 Day 1 of VRD chemotherapy 09/03/2023: Cycle 1 Day 1 of Dara/Rev/Dex. Transitioned off velcade due to ocular toxicity. 12/17/2023: Cycle 1 Day 1 of Kyprolis. Dara/Rev ineffective.   Interval History:  Brooke Whitaker 73 y.o. female with medical history significant for newly diagnosed multiple myeloma who presents for a follow up visit. The patient's last visit was on 12/25/2023. In the interim since the last visit she has had no major changes in her health.  On exam today Brooke Whitaker reports she continues to tolerate the Keppra was well with no major side effects.  She reports he is not having any numbness or tingling of her fingers and toes.  She reports her appetite is strong but she has been having some changes in her taste.  She reports that coffee has been tasting bad as has chicken salad.  She reports she recently switched from Zyrtec to Xyzal and is making a big difference with her allergies.  She notes that she is also having some difficulty with the steroids as it is keeping her up at night and she would like to consider a decreased dosage.  She denies any fevers, chills, sweats, nausea, vomiting, or diarrhea.  A full 10 point ROS is otherwise negative.  Previously we discussed the results of her serum free light chains.  We  discussed that her lambda is rising despite optimal treatment with Dara/rev/Dex.  We noted that this increase has begun to pick up momentum and that she has not dropped any further since we transition her off the Velcade therapy.  Given this I recommend that we transition to Kyprolis.  We discussed the risks and benefits of this treatment and she voiced understanding and was willing and able to proceed.  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    Sigmoid diverticulosis    Wears glasses     SURGICAL HISTORY: Past Surgical History:  Procedure Laterality Date   BREAST BIOPSY Right 09/2011   benign    COLONOSCOPY  2010   CYSTOSCOPY/RETROGRADE/URETEROSCOPY/STONE EXTRACTION WITH BASKET Left 04/21/2014   Procedure: cystoscopy, left retrograde pylegram, LEFT URETEROSCOPY, laser lithotripsy with holmium laser, STONE EXTRACTION, uretheral calibration;  Surgeon: Anner Crete, MD;  Location: [facility];  Service: Urology;  Laterality: Left;   DILATION AND EVACUATION  1995   EPISIOTOMY REPAIR, SEVERE TEAR  1989   LAPAROSCOPIC OVARIAN CYSTECTOMY  10/ 2000   NM MYOCAR PERF WALL MOTION  12/15/2009   Normal   US ECHOCARDIOGRAPHY  12/15/2009   Normal study for age: mild MR,TR and trace PI    SOCIAL HISTORY: Social History   Socioeconomic  History   Marital status: Married    Spouse name: Not on file   Number of children: 2   Years of education: Not on file   Highest education level: Not on file  Occupational History   Occupation: Print production planner  Tobacco Use   Smoking status: Never    Passive exposure: Never   Smokeless tobacco: Never  Vaping Use   Vaping status: Never Used  Substance and Sexual Activity   Alcohol use: Yes    Alcohol/week: 2.0 standard drinks of alcohol    Types: 2 Glasses of wine  per week   Drug use: No   Sexual activity: Not Currently    Partners: Male  Other Topics Concern   Not on file  Social History Narrative   Not on file   Social Drivers of Health   Financial Resource Strain: Not on file  Food Insecurity: No Food Insecurity (05/16/2023)   Hunger Vital Sign    Worried About Running Out of Food in the Last Year: Never true    Ran Out of Food in the Last Year: Never true  Transportation Needs: No Transportation Needs (05/16/2023)   PRAPARE - Administrator, Civil Service (Medical): No    Lack of Transportation (Non-Medical): No  Physical Activity: Not on file  Stress: Not on file  Social Connections: Not on file  Intimate Partner Violence: Not At Risk (05/16/2023)   Humiliation, Afraid, Rape, and Kick questionnaire    Fear of Current or Ex-Partner: No    Emotionally Abused: No    Physically Abused: No    Sexually Abused: No    FAMILY HISTORY: Family History  Problem Relation Age of Onset   Diabetes Mother    Congestive Heart Failure Mother    Stroke Mother    Osteoporosis Mother    Other Mother        gallbladder ruptured   Heart attack Father    Diabetes Sister    Heart disease Sister    Heart disease Sister        x2   Hypertension Sister    Thyroid disease Sister    Hypertension Sister    Diabetes Brother    Dementia Brother    Colon cancer Brother        mets to liver   Heart disease Brother        pacemaker   Stroke Brother    Liver cancer Brother    Heart disease Brother    Stomach cancer Neg Hx    Pancreatic cancer Neg Hx    Esophageal cancer Neg Hx     ALLERGIES:  is allergic to penicillins and sulfa antibiotics.  MEDICATIONS:  Current Outpatient Medications  Medication Sig Dispense Refill   doxycycline (MONODOX) 100 MG capsule Take 100 mg by mouth 2 (two) times daily. For 7 days     acetaminophen (TYLENOL) 500 MG tablet Take 500 mg by mouth every 6 (six) hours as needed for mild pain or headache.       acyclovir (ZOVIRAX) 400 MG tablet Take 1 tablet (400 mg total) by mouth 2 (two) times daily. 180 tablet 1   allopurinol (ZYLOPRIM) 300 MG tablet Take 1 tablet (300 mg total) by mouth daily. (Patient not taking: Reported on 12/25/2023) 90 tablet 1   aspirin EC 81 MG tablet Take 81 mg by mouth daily. Swallow whole. (Patient not taking: Reported on 12/25/2023)     cetirizine (ZYRTEC) 5 MG tablet Take 5 mg by  mouth daily as needed for allergies.     cholecalciferol (VITAMIN D) 1000 UNITS tablet Take 1,000 Units by mouth every morning.      Cyanocobalamin (VITAMIN B 12 PO) Take 1,000 mcg by mouth daily.     dexamethasone (DECADRON) 4 MG tablet Take 10 tablets (40 mg total) by mouth once a week. Take once weekly on the day you receive the chemotherapy injection. 40 tablet 5   ELDERBERRY PO Take 1 tablet by mouth daily.     Elderberry-Vitamin C-Zinc (ELDERBERRY IMMUNE HEALTH GUMMY PO) Take by mouth daily.     fluticasone (FLONASE) 50 MCG/ACT nasal spray Place into the nose as needed.     ondansetron (ZOFRAN) 8 MG tablet Take 1 tablet (8 mg total) by mouth every 8 (eight) hours as needed. 30 tablet 0   prochlorperazine (COMPAZINE) 10 MG tablet Take 1 tablet (10 mg total) by mouth every 6 (six) hours as needed for nausea or vomiting. 30 tablet 0   rosuvastatin (CRESTOR) 5 MG tablet Take 5 mg by mouth daily.     No current facility-administered medications for this visit.   Facility-Administered Medications Ordered in Other Visits  Medication Dose Route Frequency Provider Last Rate Last Admin   0.9 %  sodium chloride infusion   Intravenous Continuous Jaci Standard, MD   Stopped at 01/28/24 1157    REVIEW OF SYSTEMS:   Constitutional: ( - ) fevers, ( - )  chills , ( - ) night sweats Eyes: ( - ) blurriness of vision, ( - ) double vision, ( - ) watery eyes Ears, nose, mouth, throat, and face: ( - ) mucositis, ( - ) sore throat Respiratory: ( - ) cough, ( - ) dyspnea, ( - ) wheezes Cardiovascular: ( -  ) palpitation, ( - ) chest discomfort, ( - ) lower extremity swelling Gastrointestinal:  ( - ) nausea, ( - ) heartburn, ( - ) change in bowel habits Skin: ( - ) abnormal skin rashes Lymphatics: ( - ) new lymphadenopathy, ( - ) easy bruising Neurological: ( - ) numbness, ( - ) tingling, ( - ) new weaknesses Behavioral/Psych: ( - ) mood change, ( - ) new changes  All other systems were reviewed with the patient and are negative.  PHYSICAL EXAMINATION: ECOG PERFORMANCE STATUS: 0 - Asymptomatic  Vitals:   01/28/24 0925  BP: (!) 148/72  Pulse: 85  Resp: 16  Temp: 98.1 F (36.7 C)  SpO2: 97%       Filed Weights   01/28/24 0925  Weight: 141 lb 8 oz (64.2 kg)      GENERAL: Well-appearing elderly Caucasian female, alert, no distress and comfortable SKIN: skin color, texture, turgor are normal, no rashes or significant lesions EYES: conjunctiva are pink and non-injected, sclera clear LUNGS: clear to auscultation and percussion with normal breathing effort HEART: regular rate & rhythm and no murmurs and no lower extremity edema Musculoskeletal: no cyanosis of digits and no clubbing  PSYCH: alert & oriented x 3, fluent speech NEURO: no focal motor/sensory deficits  LABORATORY DATA:  I have reviewed the data as listed    Latest Ref Rng & Units 01/28/2024    9:02 AM 01/21/2024    2:11 PM 01/14/2024    1:18 PM  CBC  WBC 4.0 - 10.5 K/uL 4.4  6.5  9.7   Hemoglobin 12.0 - 15.0 g/dL 16.1  09.6  04.5   Hematocrit 36.0 - 46.0 % 40.8  42.1  42.6  Platelets 150 - 400 K/uL 121  110  240        Latest Ref Rng & Units 01/28/2024    9:02 AM 01/21/2024    2:11 PM 01/14/2024    1:18 PM  CMP  Glucose 70 - 99 mg/dL 161  096  045   BUN 8 - 23 mg/dL 17  17  17    Creatinine 0.44 - 1.00 mg/dL 4.09  8.11  9.14   Sodium 135 - 145 mmol/L 142  141  141   Potassium 3.5 - 5.1 mmol/L 3.7  3.9  4.0   Chloride 98 - 111 mmol/L 108  107  107   CO2 22 - 32 mmol/L 29  26  26    Calcium 8.9 - 10.3 mg/dL  8.9  9.3  9.2   Total Protein 6.5 - 8.1 g/dL 5.8  6.5  6.3   Total Bilirubin 0.0 - 1.2 mg/dL 1.5  1.4  1.4   Alkaline Phos 38 - 126 U/L 82  89  92   AST 15 - 41 U/L 17  20  17    ALT 0 - 44 U/L 20  23  16      Lab Results  Component Value Date   MPROTEIN 0.1 (H) 01/21/2024   MPROTEIN 0.2 (H) 11/26/2023   MPROTEIN 0.1 (H) 10/29/2023   Lab Results  Component Value Date   KPAFRELGTCHN 1.2 (L) 01/21/2024   KPAFRELGTCHN 3.2 (L) 11/26/2023   KPAFRELGTCHN 3.4 10/29/2023   LAMBDASER 41.5 (H) 01/21/2024   LAMBDASER 79.0 (H) 11/26/2023   LAMBDASER 63.0 (H) 10/29/2023   KAPLAMBRATIO 0.03 (L) 01/21/2024   KAPLAMBRATIO 0.04 (L) 11/26/2023   KAPLAMBRATIO 0.05 (L) 10/29/2023    RADIOGRAPHIC STUDIES: ECHOCARDIOGRAM COMPLETE Result Date: 01/24/2024    ECHOCARDIOGRAM REPORT   Patient Name:   ELIYANNA AULT Date of Exam: 01/24/2024 Medical Rec #:  [id number]      Height:       61.0 in Accession #:    [id number]     Weight:       142.5 lb Date of Birth:  [birth year]      BSA:          1.635 m Patient Age:    72 years       BP:           139/63 mmHg Patient Gender: F              HR:           66 bpm. Exam Location:  [address] Procedure: 2D Echo, 3D Echo and Strain Analysis (Both Spectral and Color Flow            Doppler were utilized during procedure). Indications:    I42.9 Cardiomyopathy (unspecified)  History:        Patient has prior history of Echocardiogram examinations, most                 recent 12/15/2009. Risk Factors:Dyslipidemia. Multiple myeloma.  Sonographer:    Cathie Beams RCS Referring Phys: [phone] JONATHAN J BERRY IMPRESSIONS  1. Left ventricular ejection fraction, by estimation, is 60 to 65%. Left ventricular ejection fraction by 3D volume is 61 %. The left ventricle has normal function. The left ventricle has no regional wall motion abnormalities. Left ventricular diastolic  parameters were normal. The average left ventricular global longitudinal strain is -20.3 %. The global longitudinal  strain is normal.  2. Right ventricular systolic function is normal. The right ventricular  size is normal. There is normal pulmonary artery systolic pressure. The estimated right ventricular systolic pressure is 23.8 mmHg.  3. The mitral valve is grossly normal. Mild mitral valve regurgitation. No evidence of mitral stenosis.  4. The aortic valve is tricuspid. Aortic valve regurgitation is trivial. No aortic stenosis is present.  5. The inferior vena cava is normal in size with greater than 50% respiratory variability, suggesting right atrial pressure of 3 mmHg. FINDINGS  Left Ventricle: Left ventricular ejection fraction, by estimation, is 60 to 65%. Left ventricular ejection fraction by 3D volume is 61 %. The left ventricle has normal function. The left ventricle has no regional wall motion abnormalities. The average left ventricular global longitudinal strain is -20.3 %. Strain was performed and the global longitudinal strain is normal. The left ventricular internal cavity size was normal in size. There is no left ventricular hypertrophy. Left ventricular diastolic parameters were normal. Right Ventricle: The right ventricular size is normal. No increase in right ventricular wall thickness. Right ventricular systolic function is normal. There is normal pulmonary artery systolic pressure. The tricuspid regurgitant velocity is 2.28 m/s, and  with an assumed right atrial pressure of 3 mmHg, the estimated right ventricular systolic pressure is 23.8 mmHg. Left Atrium: Left atrial size was normal in size. Right Atrium: Right atrial size was normal in size. Pericardium: There is no evidence of pericardial effusion. Mitral Valve: The mitral valve is grossly normal. Mild mitral valve regurgitation. No evidence of mitral valve stenosis. Tricuspid Valve: The tricuspid valve is grossly normal. Tricuspid valve regurgitation is mild . No evidence of tricuspid stenosis. Aortic Valve: The aortic valve is tricuspid. Aortic valve  regurgitation is trivial. No aortic stenosis is present. Pulmonic Valve: The pulmonic valve was grossly normal. Pulmonic valve regurgitation is trivial. No evidence of pulmonic stenosis. Aorta: The aortic root and ascending aorta are structurally normal, with no evidence of dilitation. Venous: The inferior vena cava is normal in size with greater than 50% respiratory variability, suggesting right atrial pressure of 3 mmHg. IAS/Shunts: The atrial septum is grossly normal. Additional Comments: 3D was performed not requiring image post processing on an independent workstation and was normal.  LEFT VENTRICLE PLAX 2D LVIDd:         3.90 cm         Diastology LVIDs:         2.60 cm         LV e' medial:    8.16 cm/s LV PW:         0.80 cm         LV E/e' medial:  10.1 LV IVS:        0.70 cm         LV e' lateral:   7.62 cm/s LVOT diam:     1.90 cm         LV E/e' lateral: 10.8 LV SV:         77 LV SV Index:   47              2D Longitudinal LVOT Area:     2.84 cm        Strain                                2D Strain GLS   -21.5 %                                (  A4C):                                2D Strain GLS   -20.6 %                                (A3C):                                2D Strain GLS   -18.7 %                                (A2C):                                2D Strain GLS   -20.3 %                                Avg:                                 3D Volume EF                                LV 3D EF:    Left                                             ventricul                                             ar                                             ejection                                             fraction                                             by 3D                                             volume is  61 %.                                 3D Volume EF:                                3D EF:        61 %                                LV EDV:        82 ml                                LV ESV:       32 ml                                LV SV:        50 ml RIGHT VENTRICLE RV Basal diam:  3.20 cm RV S prime:     13.90 cm/s TAPSE (M-mode): 2.5 cm RVSP:           23.8 mmHg LEFT ATRIUM             Index        RIGHT ATRIUM           Index LA diam:        3.20 cm 1.96 cm/m   RA Pressure: 3.00 mmHg LA Vol (A2C):   35.4 ml 21.65 ml/m  RA Area:     14.60 cm LA Vol (A4C):   43.8 ml 26.78 ml/m  RA Volume:   35.60 ml  21.77 ml/m LA Biplane Vol: 40.4 ml 24.70 ml/m  AORTIC VALVE LVOT Vmax:   127.00 cm/s LVOT Vmean:  85.900 cm/s LVOT VTI:    0.272 m  AORTA Ao Root diam: 2.60 cm Ao Asc diam:  3.10 cm MITRAL VALVE                TRICUSPID VALVE MV Area (PHT): 3.24 cm     TR Peak grad:   20.8 mmHg MV Decel Time: 234 msec     TR Vmax:        228.00 cm/s MV E velocity: 82.30 cm/s   Estimated RAP:  3.00 mmHg MV A velocity: 111.00 cm/s  RVSP:           23.8 mmHg MV E/A ratio:  0.74                             SHUNTS                             Systemic VTI:  0.27 m                             Systemic Diam: 1.90 cm Jackquelyn Mass MD Electronically signed by Jackquelyn Mass MD Signature Date/Time: 01/24/2024/4:24:18 PM    Final     ASSESSMENT & PLAN Brooke Whitaker 73 y.o. female with medical history significant for newly diagnosed multiple myeloma who presents for a follow up  visit.   Previously we discussed the diagnosis of multiple myeloma.  We discussed that this is a blood cancer that is not curable.  The goal is to get the patient into a durable remission with treatment.  We discussed the need for a minimum of 6 months of chemotherapy treatment then followed by maintenance therapy if the M protein has reached 0 and the free light chains are within normal limits.  We discussed expected side effects of the regimen and the plan moving forward.  The patient voiced her understanding of our findings, expected side effects, and risks/benefits.  She is okay with proceeding  with chemotherapy at this time.  # Free Lambda Multiple Myeloma -- Diagnosis confirmed by the presence of a macrocytic anemia and 80% plasma cells in the bone marrow --Initially started with VRD chemotherapy with the patient developed rash as result of the Revlimid and ocular toxicity from the Velcade.  Dropped the Revlimid down to 10 mg p.o. daily and transition to Darzalex for her next treatment. --UPEP showed marked Bence Jones proteins in the urine, no evidence of lytic lesions on metastatic survey PLAN: -- Continue Kyprolis/Dex therapy.  Today is Cycle 2 Day 15 --Labs today show white blood cell 4.4, hemoglobin 13.9, MCV 92.1, platelets 121 --On 11/26/2023 patient had kappa 1.2, lambda 41.5, ratio 0.03 with an M protein of 0.1 -- Plan for return to clinic in 2 weeks with interval continued Kyprolis therapy.  #Supportive Care -- chemotherapy education complete -- port placement requested by patient.  Ordered for next week -- ASA 81 mg PO daily to be taken with revlimid.  Can be discontinued after Revlimid is stopped. -- zofran 8mg  q8H PRN and compazine 10mg  PO q6H for nausea -- acyclovir 400mg  PO BID for VCZ prophylaxis -- EMLA cream for port --Will discuss the need for Zometa treatment after symptoms of her regimen have stabilized. -- no pain medication required at this time.    No orders of the defined types were placed in this encounter.   All questions were answered. The patient knows to call the clinic with any problems, questions or concerns.  A total of more than 30 minutes were spent on this encounter with face-to-face time and non-face-to-face time, including preparing to see the patient, ordering tests and/or medications, counseling the patient and coordination of care as outlined above.   Rogerio Clay, MD Department of Hematology/Oncology [facility] Phone: [phone] Pager: [phone] Email:  [email or web address]  01/28/2024 4:58 PM

## 2024-01-28 NOTE — Patient Instructions (Signed)

## 2024-01-31 ENCOUNTER — Ambulatory Visit (HOSPITAL_BASED_OUTPATIENT_CLINIC_OR_DEPARTMENT_OTHER): Payer: Medicare HMO | Admitting: Obstetrics & Gynecology

## 2024-02-01 ENCOUNTER — Other Ambulatory Visit: Payer: Self-pay | Admitting: Radiology

## 2024-02-01 NOTE — H&P (Signed)
 Chief Complaint: Multiple myeloma - IR consulted for port-a-catheter placement  Referring Provider(s): Ander Bame, MD  Supervising Physician: Art Largo  Patient Status: [facility] - Out-pt  History of Present Illness: Brooke Whitaker is a 73 y.o. female with pmhx multiple myeloma, diagnosed via bone marrow biopsy 06/01/23. Has been established with Dr. Rosaline Coma with oncology. She has been on VRD chemotherapy since September 2024. Recently had labs showing her lambda is rising despite this therapy and decision was made to switch to Kyprolis . Pt had requested port due to the continued chemotherapy, pt now presenting to IR service for this placement.  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    Sigmoid diverticulosis    Wears glasses     Past Surgical History:  Procedure Laterality Date   BREAST BIOPSY Right 09/2011   benign    COLONOSCOPY  2010   CYSTOSCOPY/RETROGRADE/URETEROSCOPY/STONE EXTRACTION WITH BASKET Left 04/21/2014   Procedure: cystoscopy, left retrograde pylegram, LEFT URETEROSCOPY, laser lithotripsy with holmium laser, STONE EXTRACTION, uretheral calibration;  Surgeon: Willye Harvey, MD;  Location: [facility];  Service: Urology;  Laterality: Left;   DILATION AND EVACUATION  1995   EPISIOTOMY REPAIR, SEVERE TEAR  1989   LAPAROSCOPIC OVARIAN CYSTECTOMY  10/ 2000   NM MYOCAR PERF WALL MOTION  12/15/2009   Normal   US  ECHOCARDIOGRAPHY  12/15/2009   Normal study for age: mild MR,TR and trace PI    Allergies: Penicillins and Sulfa antibiotics  Medications: Prior to Admission medications   Medication Sig Start Date End Date Taking? Authorizing Provider  acetaminophen  (TYLENOL ) 500 MG tablet Take 500 mg by mouth every 6 (six) hours as needed for mild pain or headache.      [provider]  acyclovir  (ZOVIRAX ) 400 MG tablet Take 1 tablet (400 mg total) by mouth 2 (two) times daily. 01/23/24   Ander Bame, MD  allopurinol  (ZYLOPRIM ) 300 MG tablet Take 1 tablet (300 mg total) by mouth daily. Patient not taking: Reported on 12/25/2023 12/10/23   Dorsey, John T IV, MD  aspirin EC 81 MG tablet Take 81 mg by mouth daily. Swallow whole. Patient not taking: Reported on 12/25/2023    [provider]  cetirizine (ZYRTEC) 5 MG tablet Take 5 mg by mouth daily as needed for allergies.    [provider]  cholecalciferol (VITAMIN D ) 1000 UNITS tablet Take 1,000 Units by mouth every morning.     [provider]  Cyanocobalamin  (VITAMIN B 12 PO) Take 1,000 mcg by mouth daily.    [provider]  dexamethasone  (DECADRON ) 4 MG tablet Take 10 tablets (40 mg total) by mouth once a week. Take once weekly on the day you receive the chemotherapy injection. 01/23/24   Dorsey, John T IV, MD  doxycycline (MONODOX) 100 MG capsule Take 100 mg by mouth 2 (two) times daily. For 7 days 01/25/24   [provider]  ELDERBERRY PO Take 1 tablet by mouth daily.    [provider]  Elderberry-Vitamin C-Zinc (ELDERBERRY IMMUNE HEALTH GUMMY PO) Take by mouth daily.    [provider]  fluticasone (FLONASE) 50 MCG/ACT nasal spray Place into the nose as needed.    [provider]  ondansetron  (ZOFRAN ) 8 MG tablet Take 1 tablet (8 mg total) by mouth every 8 (eight) hours as needed. 06/11/23   Ander Bame, MD  prochlorperazine  (COMPAZINE ) 10 MG tablet Take 1 tablet (10 mg total) by mouth every 6 (six) hours as needed for nausea or vomiting. 06/11/23   Dorsey, John T IV, MD  rosuvastatin (CRESTOR) 5 MG tablet Take 5 mg by mouth daily. 04/24/19   [provider]     Family History  Problem Relation Age of Onset   Diabetes Mother    Congestive Heart Failure Mother    Stroke Mother    Osteoporosis Mother    Other  Mother        gallbladder ruptured   Heart attack Father    Diabetes Sister    Heart disease Sister    Heart disease Sister        x2   Hypertension Sister    Thyroid  disease Sister    Hypertension Sister    Diabetes Brother    Dementia Brother    Colon cancer Brother        mets to liver   Heart disease Brother        pacemaker   Stroke Brother    Liver cancer Brother    Heart disease Brother    Stomach cancer Neg Hx    Pancreatic cancer Neg Hx    Esophageal cancer Neg Hx     Social History   Socioeconomic History   Marital status: Married    Spouse name: Not on file   Number of children: 2   Years of education: Not on file   Highest education level: Not on file  Occupational History   Occupation: Print production planner  Tobacco Use   Smoking status: Never    Passive exposure: Never   Smokeless tobacco: Never  Vaping Use   Vaping status: Never Used  Substance and Sexual Activity   Alcohol  use: Yes    Alcohol /week: 2.0 standard drinks of alcohol     Types: 2 Glasses of wine per week   Drug use: No   Sexual activity: Not Currently    Partners: Male  Other Topics Concern   Not on file  Social History Narrative   Not on file   Social Drivers of Health   Financial Resource Strain: Not on file  Food Insecurity: No Food Insecurity (05/16/2023)   Hunger Vital Sign    Worried About Running Out of Food in the Last Year: Never true    Ran Out of Food in the Last Year: Never true  Transportation Needs: No Transportation Needs (05/16/2023)   PRAPARE - Administrator, Civil Service (Medical): No    Lack of Transportation (Non-Medical): No  Physical Activity: Not on file  Stress: Not on file  Social Connections: Not on file     Review of Systems: A 12 point ROS discussed and pertinent positives are indicated in the HPI above.  All other systems are negative.  Review of Systems  Constitutional:  Negative for activity change, fatigue and fever.  Respiratory:   Negative for chest tightness, shortness of breath and wheezing.   Cardiovascular:  Negative for chest pain.  Skin:  Negative for color change.  Psychiatric/Behavioral:  Negative for behavioral problems and confusion.     Vital Signs: BP (!) 162/71   Pulse 70   Temp 98.3 F (36.8 C) (Oral)   Resp 18   Ht 5\' 1"  (1.549 m)  Wt 141 lb (64 kg)   LMP 10/16/2001   SpO2 98%   BMI 26.64 kg/m   Advance Care Plan: The advanced care place/surrogate decision maker was discussed at the time of visit and the patient did not wish to discuss or was not able to name a surrogate decision maker or provide an advance care plan.  Physical Exam Constitutional:      General: She is not in acute distress.    Appearance: Normal appearance.  HENT:     Mouth/Throat:     Mouth: Mucous membranes are moist.  Cardiovascular:     Rate and Rhythm: Normal rate.     Pulses: Normal pulses.     Heart sounds: No murmur heard. Pulmonary:     Effort: Pulmonary effort is normal.     Breath sounds: Normal breath sounds. No wheezing.  Musculoskeletal:        General: Normal range of motion.  Skin:    General: Skin is warm and dry.  Neurological:     Mental Status: She is alert and oriented to person, place, and time.  Psychiatric:        Mood and Affect: Mood normal.        Behavior: Behavior normal.        Thought Content: Thought content normal.        Judgment: Judgment normal.     Imaging: ECHOCARDIOGRAM COMPLETE Result Date: 01/24/2024    ECHOCARDIOGRAM REPORT   Patient Name:   Brooke Whitaker Date of Exam: 01/24/2024 Medical Rec #:  [id number]      Height:       61.0 in Accession #:    [id number]     Weight:       142.5 lb Date of Birth:  Mar 25, 1951      BSA:          1.635 m Patient Age:    72 years       BP:           139/63 mmHg Patient Gender: F              HR:           66 bpm. Exam Location:  [address] Procedure: 2D Echo, 3D Echo and Strain Analysis (Both Spectral and Color Flow             Doppler were utilized during procedure). Indications:    I42.9 Cardiomyopathy (unspecified)  History:        Patient has prior history of Echocardiogram examinations, most                 recent 12/15/2009. Risk Factors:Dyslipidemia. Multiple myeloma.  Sonographer:    Mylinda Asa RCS Referring Phys: [phone] JONATHAN J BERRY IMPRESSIONS  1. Left ventricular ejection fraction, by estimation, is 60 to 65%. Left ventricular ejection fraction by 3D volume is 61 %. The left ventricle has normal function. The left ventricle has no regional wall motion abnormalities. Left ventricular diastolic  parameters were normal. The average left ventricular global longitudinal strain is -20.3 %. The global longitudinal strain is normal.  2. Right ventricular systolic function is normal. The right ventricular size is normal. There is normal pulmonary artery systolic pressure. The estimated right ventricular systolic pressure is 23.8 mmHg.  3. The mitral valve is grossly normal. Mild mitral valve regurgitation. No evidence of mitral stenosis.  4. The aortic valve is tricuspid. Aortic valve regurgitation is trivial. No aortic stenosis is present.  5.  The inferior vena cava is normal in size with greater than 50% respiratory variability, suggesting right atrial pressure of 3 mmHg. FINDINGS  Left Ventricle: Left ventricular ejection fraction, by estimation, is 60 to 65%. Left ventricular ejection fraction by 3D volume is 61 %. The left ventricle has normal function. The left ventricle has no regional wall motion abnormalities. The average left ventricular global longitudinal strain is -20.3 %. Strain was performed and the global longitudinal strain is normal. The left ventricular internal cavity size was normal in size. There is no left ventricular hypertrophy. Left ventricular diastolic parameters were normal. Right Ventricle: The right ventricular size is normal. No increase in right ventricular wall thickness. Right ventricular systolic  function is normal. There is normal pulmonary artery systolic pressure. The tricuspid regurgitant velocity is 2.28 m/s, and  with an assumed right atrial pressure of 3 mmHg, the estimated right ventricular systolic pressure is 23.8 mmHg. Left Atrium: Left atrial size was normal in size. Right Atrium: Right atrial size was normal in size. Pericardium: There is no evidence of pericardial effusion. Mitral Valve: The mitral valve is grossly normal. Mild mitral valve regurgitation. No evidence of mitral valve stenosis. Tricuspid Valve: The tricuspid valve is grossly normal. Tricuspid valve regurgitation is mild . No evidence of tricuspid stenosis. Aortic Valve: The aortic valve is tricuspid. Aortic valve regurgitation is trivial. No aortic stenosis is present. Pulmonic Valve: The pulmonic valve was grossly normal. Pulmonic valve regurgitation is trivial. No evidence of pulmonic stenosis. Aorta: The aortic root and ascending aorta are structurally normal, with no evidence of dilitation. Venous: The inferior vena cava is normal in size with greater than 50% respiratory variability, suggesting right atrial pressure of 3 mmHg. IAS/Shunts: The atrial septum is grossly normal. Additional Comments: 3D was performed not requiring image post processing on an independent workstation and was normal.  LEFT VENTRICLE PLAX 2D LVIDd:         3.90 cm         Diastology LVIDs:         2.60 cm         LV e' medial:    8.16 cm/s LV PW:         0.80 cm         LV E/e' medial:  10.1 LV IVS:        0.70 cm         LV e' lateral:   7.62 cm/s LVOT diam:     1.90 cm         LV E/e' lateral: 10.8 LV SV:         77 LV SV Index:   47              2D Longitudinal LVOT Area:     2.84 cm        Strain                                2D Strain GLS   -21.5 %                                (A4C):                                2D Strain GLS   -20.6 %                                (  A3C):                                2D Strain GLS   -18.7 %                                 (A2C):                                2D Strain GLS   -20.3 %                                Avg:                                 3D Volume EF                                LV 3D EF:    Left                                             ventricul                                             ar                                             ejection                                             fraction                                             by 3D                                             volume is                                             61 %.                                 3D Volume EF:                                3D  EF:        61 %                                LV EDV:       82 ml                                LV ESV:       32 ml                                LV SV:        50 ml RIGHT VENTRICLE RV Basal diam:  3.20 cm RV S prime:     13.90 cm/s TAPSE (M-mode): 2.5 cm RVSP:           23.8 mmHg LEFT ATRIUM             Index        RIGHT ATRIUM           Index LA diam:        3.20 cm 1.96 cm/m   RA Pressure: 3.00 mmHg LA Vol (A2C):   35.4 ml 21.65 ml/m  RA Area:     14.60 cm LA Vol (A4C):   43.8 ml 26.78 ml/m  RA Volume:   35.60 ml  21.77 ml/m LA Biplane Vol: 40.4 ml 24.70 ml/m  AORTIC VALVE LVOT Vmax:   127.00 cm/s LVOT Vmean:  85.900 cm/s LVOT VTI:    0.272 m  AORTA Ao Root diam: 2.60 cm Ao Asc diam:  3.10 cm MITRAL VALVE                TRICUSPID VALVE MV Area (PHT): 3.24 cm     TR Peak grad:   20.8 mmHg MV Decel Time: 234 msec     TR Vmax:        228.00 cm/s MV E velocity: 82.30 cm/s   Estimated RAP:  3.00 mmHg MV A velocity: 111.00 cm/s  RVSP:           23.8 mmHg MV E/A ratio:  0.74                             SHUNTS                             Systemic VTI:  0.27 m                             Systemic Diam: 1.90 cm Jackquelyn Mass MD Electronically signed by Jackquelyn Mass MD Signature Date/Time: 01/24/2024/4:24:18 PM    Final     Labs:  CBC: Recent Labs    12/31/23 1229  01/14/24 1318 01/21/24 1411 01/28/24 0902  WBC 7.0 9.7 6.5 4.4  HGB 14.7 14.5 14.3 13.9  HCT 43.5 42.6 42.1 40.8  PLT 109* 240 110* 121*    COAGS: No results for input(s): "INR", "APTT" in the last 8760 hours.  BMP: Recent Labs    12/31/23 1229 01/14/24 1318 01/21/24 1411 01/28/24 0902  NA 142 141 141 142  K 4.8 4.0 3.9 3.7  CL 108 107 107 108  CO2 31 26 26  29  GLUCOSE 94 156* 167* 106*  BUN 15 17 17 17   CALCIUM 9.1 9.2 9.3 8.9  CREATININE 0.58 0.63 0.57 0.66  GFRNONAA >60 >60 >60 >60    LIVER FUNCTION TESTS: Recent Labs    12/31/23 1229 01/14/24 1318 01/21/24 1411 01/28/24 0902  BILITOT 1.4* 1.4* 1.4* 1.5*  AST 21 17 20 17   ALT 24 16 23 20   ALKPHOS 86 92 89 82  PROT 6.1* 6.3* 6.5 5.8*  ALBUMIN 4.2 4.4 4.5 4.1    TUMOR MARKERS: No results for input(s): "AFPTM", "CEA", "CA199", "CHROMGRNA" in the last 8760 hours.  Assessment and Plan:  Brooke Whitaker is a 73 y.o. female with pmhx multiple myeloma, diagnosed via bone marrow biopsy 06/01/23. Has been established with Dr. Rosaline Coma with oncology. She has been on VRD chemotherapy since September 2024. Recently had labs showing her lambda is rising despite this therapy and decision was made to switch to Kyprolis . Pt had requested port due to the continued chemotherapy, pt now presenting to IR service for this placement.  Risks and benefits of image-guided Port-a-catheter placement were discussed with the patient including, but not limited to bleeding, infection, pneumothorax, or fibrin sheath development and need for additional procedures. All of the patient's questions were answered, patient is agreeable to proceed. Consent signed and in chart.   Thank you for allowing our service to participate in Brooke Whitaker 's care.  Electronically Signed: Lovena Rubinstein, PA-C   02/04/2024, 12:26 PM      I spent a total of 30 Minutes  in face to face in clinical consultation, greater than 50% of which was  counseling/coordinating care for port-a-catheter placement.

## 2024-02-04 ENCOUNTER — Other Ambulatory Visit: Payer: Self-pay

## 2024-02-04 ENCOUNTER — Ambulatory Visit (HOSPITAL_COMMUNITY)
Admission: RE | Admit: 2024-02-04 | Discharge: 2024-02-04 | Disposition: A | Discharge status: Home / Self Care | Source: Ambulatory Visit | Attending: Hematology and Oncology | Admitting: Hematology and Oncology

## 2024-02-04 ENCOUNTER — Encounter (HOSPITAL_COMMUNITY): Payer: Self-pay

## 2024-02-04 DIAGNOSIS — C9000 Multiple myeloma not having achieved remission: Secondary | ICD-10-CM | POA: Insufficient documentation

## 2024-02-04 DIAGNOSIS — Z7982 Long term (current) use of aspirin: Secondary | ICD-10-CM | POA: Insufficient documentation

## 2024-02-04 DIAGNOSIS — Z79899 Other long term (current) drug therapy: Secondary | ICD-10-CM | POA: Insufficient documentation

## 2024-02-04 HISTORY — PX: IR IMAGING GUIDED PORT INSERTION: IMG5740

## 2024-02-04 MED ORDER — HEPARIN SOD (PORK) LOCK FLUSH 100 UNIT/ML IV SOLN
INTRAVENOUS | Status: AC | PRN
  Administered 2024-02-04: 500 [IU] via INTRAVENOUS

## 2024-02-04 MED ORDER — FENTANYL CITRATE (PF) 100 MCG/2ML IJ SOLN
INTRAMUSCULAR | Status: AC | PRN
  Administered 2024-02-04 (×2): 50 ug via INTRAVENOUS

## 2024-02-04 MED ORDER — MIDAZOLAM HCL 2 MG/2ML IJ SOLN
INTRAMUSCULAR | Status: AC
  Filled 2024-02-04: qty 2

## 2024-02-04 MED ORDER — LIDOCAINE-EPINEPHRINE 1 %-1:100000 IJ SOLN
INTRAMUSCULAR | Status: AC
  Filled 2024-02-04: qty 1

## 2024-02-04 MED ORDER — HEPARIN SOD (PORK) LOCK FLUSH 100 UNIT/ML IV SOLN
INTRAVENOUS | Status: AC
  Filled 2024-02-04: qty 5

## 2024-02-04 MED ORDER — FENTANYL CITRATE (PF) 100 MCG/2ML IJ SOLN
INTRAMUSCULAR | Status: AC
  Filled 2024-02-04: qty 2

## 2024-02-04 MED ORDER — MIDAZOLAM HCL 2 MG/2ML IJ SOLN
INTRAMUSCULAR | Status: AC | PRN
Start: 2024-02-04 — End: 2024-02-04
  Administered 2024-02-04 (×3): 1 mg via INTRAVENOUS

## 2024-02-04 MED ORDER — LIDOCAINE-EPINEPHRINE 1 %-1:100000 IJ SOLN
20.0000 mL | Freq: Once | INTRAMUSCULAR | Status: AC
  Administered 2024-02-04: 20 mL via INTRADERMAL

## 2024-02-04 MED ORDER — SODIUM CHLORIDE 0.9 % IV SOLN: INTRAVENOUS | Status: DC

## 2024-02-04 NOTE — Procedures (Signed)
 Vascular and Interventional Radiology Procedure Note  Patient: Brooke Whitaker DOB: 11-14-1950 Medical Record Number: [id number] Note Date/Time: 02/04/24 12:52 PM   Performing Physician: Art Largo, MD Assistant(s): None  Diagnosis:  Myeloma  Procedure: PORT PLACEMENT  Anesthesia: Conscious Sedation Complications: None Estimated Blood Loss: Minimal  Findings:  Successful right-sided port placement, with the tip of the catheter in the proximal right atrium.  Plan: Catheter ready for use.  See detailed procedure note with images in PACS. The patient tolerated the procedure well without incident or complication and was returned to Recovery in stable condition.    Art Largo, MD [facility]   Pager. [phone] Clinic. [phone]

## 2024-02-04 NOTE — Discharge Instructions (Signed)

## 2024-02-06 ENCOUNTER — Inpatient Hospital Stay

## 2024-02-06 NOTE — Progress Notes (Signed)
 [facility] CSW Counseling Note  Patient was referred by self. Treatment type: Individual  Presenting Concerns: Patient and/or family reports the following symptoms/concerns:  Adjustment Concerns  Duration of problem: 2 weeks; Severity of problem: mild   Orientation:oriented to person, place, and time/date.   Affect: Congruent Risk of harm to self or others: No plan to harm self or others  Patient and/or Family's Strengths/Protective Factors: Social connections, Social and Emotional competence, Concrete supports in place (healthy food, safe environments, etc.), and Physical Health (exercise, healthy diet, medication compliance, etc.)Ability for insight  Active sense of humor  Average or above average intelligence  Capable of independent living  Communication skills  General fund of knowledge  Motivation for treatment/growth  Supportive family/friends      Goals Addressed: Patient will: Increase healthy adjustment to current life circumstances   Progress towards Goals: Initial   Interventions: Interventions utilized: CBT  / Person Centered.      Assessment: Patient currently experiencing challenges with adjusting to treatment changes. Patient has been in treatment with Myeloma for several months and was told by provider in March of this year that treatment will be indefinite. Patient reported difficulties with processing the treatment changes and limited opportunities to discuss with family.  Patient desires an opportunity to adjust and process news and hopes to feel "better".In addition to presenting concerns, patient is processing mother-in-law entering hospice and discussed their relationship. Patient discussed how this event impacts thoughts around mortality.   Patient reported on this date (4/23) improved presenting mood attributed to having port placed, reduction in thoughts about mortality, permission to travel to see her friends. Patient continued to discussed the process of  adjustment and progress towards accepting the diagnosis. Patient and CSW discussed ways in which cancer has limited her life, leading to feelings of frustration. Patient discussed coping skills with adjustment to diagnosis that includes gratitude and trust with providers Plan: Follow up with CSW: 3 weeks       Maudie Sorrow, [place]

## 2024-02-07 ENCOUNTER — Encounter: Payer: Self-pay | Admitting: Hematology and Oncology

## 2024-02-11 ENCOUNTER — Other Ambulatory Visit

## 2024-02-11 ENCOUNTER — Inpatient Hospital Stay

## 2024-02-11 VITALS — BP 145/75 | HR 82 | Temp 97.7°F | Resp 14 | Wt 143.5 lb

## 2024-02-11 DIAGNOSIS — C9000 Multiple myeloma not having achieved remission: Secondary | ICD-10-CM

## 2024-02-11 DIAGNOSIS — Z5112 Encounter for antineoplastic immunotherapy: Secondary | ICD-10-CM | POA: Diagnosis not present

## 2024-02-11 LAB — CMP (CANCER CENTER ONLY)
ALT: 17 U/L (ref 0–44)
AST: 19 U/L (ref 15–41)
Albumin: 4.5 g/dL (ref 3.5–5.0)
Alkaline Phosphatase: 86 U/L (ref 38–126)
Anion gap: 6 (ref 5–15)
BUN: 17 mg/dL (ref 8–23)
CO2: 27 mmol/L (ref 22–32)
Calcium: 9.1 mg/dL (ref 8.9–10.3)
Chloride: 108 mmol/L (ref 98–111)
Creatinine: 0.66 mg/dL (ref 0.44–1.00)
GFR, Estimated: >60 mL/min (ref >60)
Glucose, Bld: 181 mg/dL — ABNORMAL HIGH (ref 70–99)
Potassium: 3.9 mmol/L (ref 3.5–5.1)
Sodium: 141 mmol/L (ref 135–145)
Total Bilirubin: 1.6 mg/dL — ABNORMAL HIGH (ref 0.0–1.2)
Total Protein: 6.2 g/dL — ABNORMAL LOW (ref 6.5–8.1)

## 2024-02-11 LAB — CBC WITH DIFFERENTIAL (CANCER CENTER ONLY)
Abs Immature Granulocytes: 0.1 10*3/uL — ABNORMAL HIGH (ref 0.00–0.07)
Basophils Absolute: 0 10*3/uL (ref 0.0–0.1)
Basophils Relative: 0 %
Eosinophils Absolute: 0 10*3/uL (ref 0.0–0.5)
Eosinophils Relative: 0 %
HCT: 41 % (ref 36.0–46.0)
Hemoglobin: 14 g/dL (ref 12.0–15.0)
Immature Granulocytes: 1 %
Lymphocytes Relative: 9 %
Lymphs Abs: 0.7 10*3/uL (ref 0.7–4.0)
MCH: 32 pg (ref 26.0–34.0)
MCHC: 34.1 g/dL (ref 30.0–36.0)
MCV: 93.6 fL (ref 80.0–100.0)
Monocytes Absolute: 0.1 10*3/uL (ref 0.1–1.0)
Monocytes Relative: 1 %
Neutro Abs: 6.9 10*3/uL (ref 1.7–7.7)
Neutrophils Relative %: 89 %
Platelet Count: 199 10*3/uL (ref 150–400)
RBC: 4.38 MIL/uL (ref 3.87–5.11)
RDW: 14.7 % (ref 11.5–15.5)
WBC Count: 7.9 10*3/uL (ref 4.0–10.5)
nRBC: 0 % (ref 0.0–0.2)

## 2024-02-11 MED ORDER — CARFILZOMIB CHEMO INJECTION 60 MG
70.0000 mg/m2 | Freq: Once | INTRAVENOUS | Status: AC
  Administered 2024-02-11: 120 mg via INTRAVENOUS
  Filled 2024-02-11: qty 60

## 2024-02-11 MED ORDER — SODIUM CHLORIDE 0.9% FLUSH: 10.0000 mL | INTRAVENOUS | Status: DC | PRN

## 2024-02-11 MED ORDER — SODIUM CHLORIDE 0.9 % IV SOLN: Freq: Once | INTRAVENOUS | Status: AC

## 2024-02-11 MED ORDER — SODIUM CHLORIDE 0.9 % IV SOLN: INTRAVENOUS | Status: DC

## 2024-02-11 NOTE — Patient Instructions (Signed)
 [facility] - A DEPT OF [facility]  Discharge Instructions: Thank you for choosing [facility] to provide your oncology and hematology care.   If you have a lab appointment with the Cancer Center, please go directly to the Cancer Center and check in at the registration area.   Wear comfortable clothing and clothing appropriate for easy access to any Portacath or PICC line.   We strive to give you quality time with your provider. You may need to reschedule your appointment if you arrive late (15 or more minutes).  Arriving late affects you and other patients whose appointments are after yours.  Also, if you miss three or more appointments without notifying the office, you may be dismissed from the clinic at the provider's discretion.      For prescription refill requests, have your pharmacy contact our office and allow 72 hours for refills to be completed.    Today you received the following chemotherapy and/or immunotherapy agents: carfilzomib (KYPROLIS)      To help prevent nausea and vomiting after your treatment, we encourage you to take your nausea medication as directed.  BELOW ARE SYMPTOMS THAT SHOULD BE REPORTED IMMEDIATELY: *FEVER GREATER THAN 100.4 F (38 C) OR HIGHER *CHILLS OR SWEATING *NAUSEA AND VOMITING THAT IS NOT CONTROLLED WITH YOUR NAUSEA MEDICATION *UNUSUAL SHORTNESS OF BREATH *UNUSUAL BRUISING OR BLEEDING *URINARY PROBLEMS (pain or burning when urinating, or frequent urination) *BOWEL PROBLEMS (unusual diarrhea, constipation, pain near the anus) TENDERNESS IN MOUTH AND THROAT WITH OR WITHOUT PRESENCE OF ULCERS (sore throat, sores in mouth, or a toothache) UNUSUAL RASH, SWELLING OR PAIN  UNUSUAL VAGINAL DISCHARGE OR ITCHING   Items with * indicate a potential emergency and should be followed up as soon as possible or go to the Emergency Department if any problems should occur.  Please show the CHEMOTHERAPY ALERT CARD or  IMMUNOTHERAPY ALERT CARD at check-in to the Emergency Department and triage nurse.  Should you have questions after your visit or need to cancel or reschedule your appointment, please contact [facility] - A DEPT OF [facility]  Dept: [phone]  and follow the prompts.  Office hours are 8:00 a.m. to 4:30 p.m. Monday - Friday. Please note that voicemails left after 4:00 p.m. may not be returned until the following business day.  We are closed weekends and major holidays. You have access to a nurse at all times for urgent questions. Please call the main number to the clinic Dept: [phone] and follow the prompts.   For any non-urgent questions, you may also contact your provider using MyChart. We now offer e-Visits for anyone 62 and older to request care online for non-urgent symptoms. For details visit [email or web address].   Also download the MyChart app! Go to the app store, search "MyChart", open the app, select [place], and log in with your MyChart username and password.

## 2024-02-12 ENCOUNTER — Encounter: Payer: Self-pay | Admitting: Hematology and Oncology

## 2024-02-13 ENCOUNTER — Other Ambulatory Visit: Payer: Self-pay | Admitting: *Deleted

## 2024-02-13 MED ORDER — LIDOCAINE-PRILOCAINE 2.5-2.5 % EX CREA: 1.0000 | TOPICAL_CREAM | CUTANEOUS | 1 refills | Status: DC | PRN

## 2024-02-13 NOTE — Progress Notes (Signed)
 Prior authorization approval received for Lidocaine -Prilocaine 2.5% Cream.  PA # [id number]. Medication is approved through 05/13/2024. No other needs or concerns noted at this time.

## 2024-02-18 ENCOUNTER — Inpatient Hospital Stay

## 2024-02-18 ENCOUNTER — Other Ambulatory Visit: Payer: Self-pay | Admitting: Physician Assistant

## 2024-02-18 ENCOUNTER — Inpatient Hospital Stay: Attending: Hematology and Oncology

## 2024-02-18 VITALS — BP 147/70 | HR 84 | Temp 98.2°F | Resp 16 | Wt 141.8 lb

## 2024-02-18 DIAGNOSIS — C9000 Multiple myeloma not having achieved remission: Secondary | ICD-10-CM

## 2024-02-18 DIAGNOSIS — Z79899 Other long term (current) drug therapy: Secondary | ICD-10-CM | POA: Diagnosis not present

## 2024-02-18 DIAGNOSIS — Z95828 Presence of other vascular implants and grafts: Secondary | ICD-10-CM | POA: Insufficient documentation

## 2024-02-18 DIAGNOSIS — Z5112 Encounter for antineoplastic immunotherapy: Secondary | ICD-10-CM | POA: Diagnosis not present

## 2024-02-18 LAB — CMP (CANCER CENTER ONLY)
ALT: 19 U/L (ref 0–44)
AST: 18 U/L (ref 15–41)
Albumin: 4.3 g/dL (ref 3.5–5.0)
Alkaline Phosphatase: 83 U/L (ref 38–126)
Anion gap: 5 (ref 5–15)
BUN: 17 mg/dL (ref 8–23)
CO2: 28 mmol/L (ref 22–32)
Calcium: 9.1 mg/dL (ref 8.9–10.3)
Chloride: 108 mmol/L (ref 98–111)
Creatinine: 0.57 mg/dL (ref 0.44–1.00)
GFR, Estimated: >60 mL/min (ref >60)
Glucose, Bld: 105 mg/dL — ABNORMAL HIGH (ref 70–99)
Potassium: 4.3 mmol/L (ref 3.5–5.1)
Sodium: 141 mmol/L (ref 135–145)
Total Bilirubin: 1.6 mg/dL — ABNORMAL HIGH (ref 0.0–1.2)
Total Protein: 6.1 g/dL — ABNORMAL LOW (ref 6.5–8.1)

## 2024-02-18 LAB — CBC WITH DIFFERENTIAL (CANCER CENTER ONLY)
Abs Immature Granulocytes: 0.04 10*3/uL (ref 0.00–0.07)
Basophils Absolute: 0 10*3/uL (ref 0.0–0.1)
Basophils Relative: 0 %
Eosinophils Absolute: 0 10*3/uL (ref 0.0–0.5)
Eosinophils Relative: 0 %
HCT: 40 % (ref 36.0–46.0)
Hemoglobin: 13.4 g/dL (ref 12.0–15.0)
Immature Granulocytes: 1 %
Lymphocytes Relative: 15 %
Lymphs Abs: 1 10*3/uL (ref 0.7–4.0)
MCH: 31.5 pg (ref 26.0–34.0)
MCHC: 33.5 g/dL (ref 30.0–36.0)
MCV: 94.1 fL (ref 80.0–100.0)
Monocytes Absolute: 0.2 10*3/uL (ref 0.1–1.0)
Monocytes Relative: 3 %
Neutro Abs: 5.2 10*3/uL (ref 1.7–7.7)
Neutrophils Relative %: 81 %
Platelet Count: 106 10*3/uL — ABNORMAL LOW (ref 150–400)
RBC: 4.25 MIL/uL (ref 3.87–5.11)
RDW: 14.6 % (ref 11.5–15.5)
WBC Count: 6.4 10*3/uL (ref 4.0–10.5)
nRBC: 0 % (ref 0.0–0.2)

## 2024-02-18 MED ORDER — ACETAMINOPHEN 325 MG PO TABS
650.0000 mg | ORAL_TABLET | Freq: Four times a day (QID) | ORAL | Status: DC | PRN
  Administered 2024-02-18: 650 mg via ORAL
  Filled 2024-02-18: qty 2

## 2024-02-18 MED ORDER — SODIUM CHLORIDE 0.9 % IV SOLN
INTRAVENOUS | Status: DC
Start: 2024-02-18 — End: 2024-02-18

## 2024-02-18 MED ORDER — SODIUM CHLORIDE 0.9% FLUSH
10.0000 mL | INTRAVENOUS | Status: DC | PRN
  Administered 2024-02-18: 10 mL

## 2024-02-18 MED ORDER — DEXTROSE 5 % IV SOLN
70.0000 mg/m2 | Freq: Once | INTRAVENOUS | Status: AC
  Administered 2024-02-18: 120 mg via INTRAVENOUS
  Filled 2024-02-18: qty 60

## 2024-02-18 MED ORDER — SODIUM CHLORIDE 0.9 % IV SOLN
Freq: Once | INTRAVENOUS | Status: AC
Start: 2024-02-18 — End: 2024-02-18

## 2024-02-18 MED ORDER — HEPARIN SOD (PORK) LOCK FLUSH 100 UNIT/ML IV SOLN
500.0000 [IU] | Freq: Once | INTRAVENOUS | Status: AC | PRN
  Administered 2024-02-18: 500 [IU]

## 2024-02-18 MED ORDER — SODIUM CHLORIDE 0.9% FLUSH
10.0000 mL | Freq: Once | INTRAVENOUS | Status: AC
  Administered 2024-02-18: 10 mL

## 2024-02-18 NOTE — Progress Notes (Signed)
 Per Wyline Hearing, PA-C - okay to proceed with treatment with bilirubin of 1.6.

## 2024-02-18 NOTE — Patient Instructions (Signed)
 [facility] - A DEPT OF [facility]   Discharge Instructions: Thank you for choosing [facility] to provide your oncology and hematology care.   If you have a lab appointment with the Cancer Center, please go directly to the Cancer Center and check in at the registration area.   Wear comfortable clothing and clothing appropriate for easy access to any Portacath or PICC line.   We strive to give you quality time with your provider. You may need to reschedule your appointment if you arrive late (15 or more minutes).  Arriving late affects you and other patients whose appointments are after yours.  Also, if you miss three or more appointments without notifying the office, you may be dismissed from the clinic at the provider's discretion.      For prescription refill requests, have your pharmacy contact our office and allow 72 hours for refills to be completed.    Today you received the following chemotherapy and/or immunotherapy agents: Carfilzomib (Kyprolis)      To help prevent nausea and vomiting after your treatment, we encourage you to take your nausea medication as directed.  BELOW ARE SYMPTOMS THAT SHOULD BE REPORTED IMMEDIATELY: *FEVER GREATER THAN 100.4 F (38 C) OR HIGHER *CHILLS OR SWEATING *NAUSEA AND VOMITING THAT IS NOT CONTROLLED WITH YOUR NAUSEA MEDICATION *UNUSUAL SHORTNESS OF BREATH *UNUSUAL BRUISING OR BLEEDING *URINARY PROBLEMS (pain or burning when urinating, or frequent urination) *BOWEL PROBLEMS (unusual diarrhea, constipation, pain near the anus) TENDERNESS IN MOUTH AND THROAT WITH OR WITHOUT PRESENCE OF ULCERS (sore throat, sores in mouth, or a toothache) UNUSUAL RASH, SWELLING OR PAIN  UNUSUAL VAGINAL DISCHARGE OR ITCHING   Items with * indicate a potential emergency and should be followed up as soon as possible or go to the Emergency Department if any problems should occur.  Please show the CHEMOTHERAPY ALERT CARD or  IMMUNOTHERAPY ALERT CARD at check-in to the Emergency Department and triage nurse.  Should you have questions after your visit or need to cancel or reschedule your appointment, please contact [facility] - A DEPT [facility]  Dept: [phone]  and follow the prompts.  Office hours are 8:00 a.m. to 4:30 p.m. Monday - Friday. Please note that voicemails left after 4:00 p.m. may not be returned until the following business day.  We are closed weekends and major holidays. You have access to a nurse at all times for urgent questions. Please call the main number to the clinic Dept: [phone] and follow the prompts.   For any non-urgent questions, you may also contact your provider using MyChart. We now offer e-Visits for anyone 55 and older to request care online for non-urgent symptoms. For details visit [email or web address].   Also download the MyChart app! Go to the app store, search "MyChart", open the app, select [place], and log in with your MyChart username and password.

## 2024-02-19 ENCOUNTER — Other Ambulatory Visit: Payer: Self-pay

## 2024-02-21 DIAGNOSIS — C9000 Multiple myeloma not having achieved remission: Secondary | ICD-10-CM | POA: Diagnosis not present

## 2024-02-21 DIAGNOSIS — J302 Other seasonal allergic rhinitis: Secondary | ICD-10-CM | POA: Diagnosis not present

## 2024-02-21 DIAGNOSIS — Z800 Family history of malignant neoplasm of digestive organs: Secondary | ICD-10-CM | POA: Diagnosis not present

## 2024-02-21 DIAGNOSIS — D7589 Other specified diseases of blood and blood-forming organs: Secondary | ICD-10-CM | POA: Diagnosis not present

## 2024-02-21 DIAGNOSIS — M8580 Other specified disorders of bone density and structure, unspecified site: Secondary | ICD-10-CM | POA: Diagnosis not present

## 2024-02-21 DIAGNOSIS — G4700 Insomnia, unspecified: Secondary | ICD-10-CM | POA: Diagnosis not present

## 2024-02-21 DIAGNOSIS — Z452 Encounter for adjustment and management of vascular access device: Secondary | ICD-10-CM | POA: Diagnosis not present

## 2024-02-21 DIAGNOSIS — R002 Palpitations: Secondary | ICD-10-CM | POA: Diagnosis not present

## 2024-02-21 DIAGNOSIS — I700 Atherosclerosis of aorta: Secondary | ICD-10-CM | POA: Diagnosis not present

## 2024-02-21 DIAGNOSIS — E785 Hyperlipidemia, unspecified: Secondary | ICD-10-CM | POA: Diagnosis not present

## 2024-02-21 DIAGNOSIS — I10 Essential (primary) hypertension: Secondary | ICD-10-CM | POA: Diagnosis not present

## 2024-02-21 DIAGNOSIS — Z8249 Family history of ischemic heart disease and other diseases of the circulatory system: Secondary | ICD-10-CM | POA: Diagnosis not present

## 2024-02-22 ENCOUNTER — Encounter: Payer: Self-pay | Admitting: Hematology and Oncology

## 2024-02-23 ENCOUNTER — Other Ambulatory Visit: Payer: Self-pay

## 2024-02-26 ENCOUNTER — Inpatient Hospital Stay

## 2024-02-26 ENCOUNTER — Inpatient Hospital Stay (HOSPITAL_BASED_OUTPATIENT_CLINIC_OR_DEPARTMENT_OTHER): Admitting: Physician Assistant

## 2024-02-26 ENCOUNTER — Other Ambulatory Visit: Payer: Self-pay | Admitting: Physician Assistant

## 2024-02-26 ENCOUNTER — Other Ambulatory Visit: Payer: Self-pay

## 2024-02-26 VITALS — BP 138/66 | HR 75 | Temp 98.8°F | Resp 17 | Wt 141.8 lb

## 2024-02-26 DIAGNOSIS — C9000 Multiple myeloma not having achieved remission: Secondary | ICD-10-CM

## 2024-02-26 DIAGNOSIS — Z95828 Presence of other vascular implants and grafts: Secondary | ICD-10-CM

## 2024-02-26 DIAGNOSIS — R519 Headache, unspecified: Secondary | ICD-10-CM | POA: Diagnosis not present

## 2024-02-26 DIAGNOSIS — Z5112 Encounter for antineoplastic immunotherapy: Secondary | ICD-10-CM | POA: Diagnosis not present

## 2024-02-26 LAB — CBC WITH DIFFERENTIAL (CANCER CENTER ONLY)
Abs Immature Granulocytes: 0.03 10*3/uL (ref 0.00–0.07)
Basophils Absolute: 0 10*3/uL (ref 0.0–0.1)
Basophils Relative: 0 %
Eosinophils Absolute: 0.1 10*3/uL (ref 0.0–0.5)
Eosinophils Relative: 1 %
HCT: 38.4 % (ref 36.0–46.0)
Hemoglobin: 12.8 g/dL (ref 12.0–15.0)
Immature Granulocytes: 0 %
Lymphocytes Relative: 18 %
Lymphs Abs: 1.2 10*3/uL (ref 0.7–4.0)
MCH: 32.2 pg (ref 26.0–34.0)
MCHC: 33.3 g/dL (ref 30.0–36.0)
MCV: 96.7 fL (ref 80.0–100.0)
Monocytes Absolute: 0.4 10*3/uL (ref 0.1–1.0)
Monocytes Relative: 6 %
Neutro Abs: 5.1 10*3/uL (ref 1.7–7.7)
Neutrophils Relative %: 75 %
Platelet Count: 164 10*3/uL (ref 150–400)
RBC: 3.97 MIL/uL (ref 3.87–5.11)
RDW: 14.7 % (ref 11.5–15.5)
WBC Count: 6.8 10*3/uL (ref 4.0–10.5)
nRBC: 0 % (ref 0.0–0.2)

## 2024-02-26 LAB — CMP (CANCER CENTER ONLY)
ALT: 16 U/L (ref 0–44)
AST: 14 U/L — ABNORMAL LOW (ref 15–41)
Albumin: 4 g/dL (ref 3.5–5.0)
Alkaline Phosphatase: 80 U/L (ref 38–126)
Anion gap: 5 (ref 5–15)
BUN: 15 mg/dL (ref 8–23)
CO2: 29 mmol/L (ref 22–32)
Calcium: 8.7 mg/dL — ABNORMAL LOW (ref 8.9–10.3)
Chloride: 110 mmol/L (ref 98–111)
Creatinine: 0.55 mg/dL (ref 0.44–1.00)
GFR, Estimated: >60 mL/min (ref >60)
Glucose, Bld: 84 mg/dL (ref 70–99)
Potassium: 4 mmol/L (ref 3.5–5.1)
Sodium: 144 mmol/L (ref 135–145)
Total Bilirubin: 1.6 mg/dL — ABNORMAL HIGH (ref 0.0–1.2)
Total Protein: 5.5 g/dL — ABNORMAL LOW (ref 6.5–8.1)

## 2024-02-26 MED ORDER — SODIUM CHLORIDE 0.9% FLUSH
10.0000 mL | Freq: Once | INTRAVENOUS | Status: AC
Start: 2024-02-26 — End: 2024-02-26
  Administered 2024-02-26: 10 mL

## 2024-02-26 MED ORDER — DEXTROSE 5 % IV SOLN
70.0000 mg/m2 | Freq: Once | INTRAVENOUS | Status: AC
  Administered 2024-02-26: 120 mg via INTRAVENOUS
  Filled 2024-02-26: qty 60

## 2024-02-26 MED ORDER — SODIUM CHLORIDE 0.9 % IV SOLN: Freq: Once | INTRAVENOUS | Status: AC

## 2024-02-26 MED ORDER — SODIUM CHLORIDE 0.9 % IV SOLN: INTRAVENOUS | Status: DC

## 2024-02-26 MED ORDER — TRAMADOL HCL 50 MG PO TABS: 50.0000 mg | ORAL_TABLET | Freq: Four times a day (QID) | ORAL | 0 refills | Status: DC | PRN

## 2024-02-26 NOTE — Progress Notes (Signed)
 [facility] [phone]   [phone]  PROGRESS NOTE  Patient Care Team: Aldo Hun, MD as PCP - General (Internal Medicine)  Hematological/Oncological History # Free Lambda Multiple Myeloma 11/10/2022: WBC 5.68, Hgb 11.7, MCV 97.4, Plt 146 04/17/2023: WBC 4.53, Hgb 11.5, MCV 107.6, Plt 110 05/16/2023: establish care with Dr. Rosaline Coma. Labs showed M protein 0.1 with Lambda 900, Kappa 3 with ratio 0.00.   06/01/2023: bone marrow biopsy performed, showed plasma cell myeloma nearly effacing approximately 80% of the cellular marrow.  06/25/2023: Cycle 1 Day 1 of VRD chemotherapy 07/23/2023: Cycle 2 Day 1 of VRD chemotherapy 08/13/2023: Cycle 3 Day 1 of VRD chemotherapy 09/03/2023: Cycle 1 Day 1 of Dara/Rev/Dex. 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 12/17/2023: Cycle 1 Day 1 of Kyprolis /Dex. Dara/Rev ineffective.  01/15/2024: Cycle 2 Day 1 of Kyprolis /Dex 02/11/2024: Cycle 3 Day 1 of Kyprolis /Dex  Interval History:  Brooke Whitaker 73 y.o. female with medical history significant for newly diagnosed multiple myeloma who presents for a follow up visit. The patient's last visit was on 01/28/2024. In the interim since the last visit she has had no major changes in her health.  On exam today Brooke Whitaker she is tolerating Kyprolis  without any significant side effects.  Her energy levels are fairly stable and she can complete her daily activities on her own.  Her appetite is strong without any weight changes.  She denies nausea, vomiting or abdominal pain.  She does have some constipation which is well-controlled with her over-the-counter stool softeners.  She continues to have a headache for 1 to 2 days after each treatment that is minimally improved with Tylenol .  She recently tried to wear a cool cap did improve her headaches.  She denies any new bone pain or back pain.  She denies fevers, chills, sweats, shortness of breath, chest pain or cough.  She has no other  complaints.  Rest of the 10 point ROS as below.  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    Sigmoid diverticulosis    Wears glasses     SURGICAL HISTORY: Past Surgical History:  Procedure Laterality Date   BREAST BIOPSY Right 09/2011   benign    COLONOSCOPY  2010   CYSTOSCOPY/RETROGRADE/URETEROSCOPY/STONE EXTRACTION WITH BASKET Left 04/21/2014   Procedure: cystoscopy, left retrograde pylegram, LEFT URETEROSCOPY, laser lithotripsy with holmium laser, STONE EXTRACTION, uretheral calibration;  Surgeon: Willye Harvey, MD;  Location: [facility];  Service: Urology;  Laterality: Left;   DILATION AND EVACUATION  1995   EPISIOTOMY REPAIR, SEVERE TEAR  1989   IR IMAGING GUIDED PORT INSERTION  02/04/2024   LAPAROSCOPIC OVARIAN CYSTECTOMY  10/ 2000   NM MYOCAR PERF WALL MOTION  12/15/2009   Normal   US  ECHOCARDIOGRAPHY  12/15/2009   Normal study for age: mild MR,TR and trace PI    SOCIAL HISTORY: Social History   Socioeconomic History   Marital status: Married    Spouse name: Not on file   Number of children: 2   Years of education: Not on file   Highest education level: Not on file  Occupational History   Occupation: Print production planner  Tobacco Use   Smoking status: Never    Passive exposure: Never   Smokeless  tobacco: Never  Vaping Use   Vaping status: Never Used  Substance and Sexual Activity   Alcohol  use: Yes    Alcohol /week: 2.0 standard drinks of alcohol     Types: 2 Glasses of wine per week   Drug use: No   Sexual activity: Not Currently    Partners: Male  Other Topics Concern   Not on file  Social History Narrative   Not on file   Social Drivers of Health   Financial Resource Strain: Not on file  Food Insecurity: No Food Insecurity (05/16/2023)   Hunger Vital Sign     Worried About Running Out of Food in the Last Year: Never true    Ran Out of Food in the Last Year: Never true  Transportation Needs: No Transportation Needs (05/16/2023)   PRAPARE - Administrator, Civil Service (Medical): No    Lack of Transportation (Non-Medical): No  Physical Activity: Not on file  Stress: Not on file  Social Connections: Not on file  Intimate Partner Violence: Not At Risk (05/16/2023)   Humiliation, Afraid, Rape, and Kick questionnaire    Fear of Current or Ex-Partner: No    Emotionally Abused: No    Physically Abused: No    Sexually Abused: No    FAMILY HISTORY: Family History  Problem Relation Age of Onset   Diabetes Mother    Congestive Heart Failure Mother    Stroke Mother    Osteoporosis Mother    Other Mother        gallbladder ruptured   Heart attack Father    Diabetes Sister    Heart disease Sister    Heart disease Sister        x2   Hypertension Sister    Thyroid  disease Sister    Hypertension Sister    Diabetes Brother    Dementia Brother    Colon cancer Brother        mets to liver   Heart disease Brother        pacemaker   Stroke Brother    Liver cancer Brother    Heart disease Brother    Stomach cancer Neg Hx    Pancreatic cancer Neg Hx    Esophageal cancer Neg Hx     ALLERGIES:  is allergic to penicillins and sulfa antibiotics.  MEDICATIONS:  Current Outpatient Medications  Medication Sig Dispense Refill   lidocaine -prilocaine  (EMLA ) cream Apply 1 Application topically as needed. 30 g 1   traMADol (ULTRAM) 50 MG tablet Take 1 tablet (50 mg total) by mouth every 6 (six) hours as needed. 30 tablet 0   acetaminophen  (TYLENOL ) 500 MG tablet Take 500 mg by mouth every 6 (six) hours as needed for mild pain or headache.      acyclovir  (ZOVIRAX ) 400 MG tablet Take 1 tablet (400 mg total) by mouth 2 (two) times daily. 180 tablet 1   allopurinol  (ZYLOPRIM ) 300 MG tablet Take 1 tablet (300 mg total) by mouth daily. (Patient  not taking: Reported on 12/25/2023) 90 tablet 1   aspirin EC 81 MG tablet Take 81 mg by mouth daily. Swallow whole. (Patient not taking: Reported on 12/25/2023)     cetirizine (ZYRTEC) 5 MG tablet Take 5 mg by mouth daily as needed for allergies.     cholecalciferol (VITAMIN D ) 1000 UNITS tablet Take 1,000 Units by mouth every morning.      Cyanocobalamin  (VITAMIN B 12 PO) Take 1,000 mcg by mouth daily.     dexamethasone  (  DECADRON ) 4 MG tablet Take 10 tablets (40 mg total) by mouth once a week. Take once weekly on the day you receive the chemotherapy injection. 40 tablet 5   doxycycline (MONODOX) 100 MG capsule Take 100 mg by mouth 2 (two) times daily. For 7 days     ELDERBERRY PO Take 1 tablet by mouth daily.     Elderberry-Vitamin C-Zinc (ELDERBERRY IMMUNE HEALTH GUMMY PO) Take by mouth daily.     fluticasone (FLONASE) 50 MCG/ACT nasal spray Place into the nose as needed.     levocetirizine (XYZAL) 5 MG tablet Take 5 mg by mouth every evening.     multivitamin-lutein (OCUVITE-LUTEIN) CAPS capsule Take 1 capsule by mouth daily.     ondansetron  (ZOFRAN ) 8 MG tablet Take 1 tablet (8 mg total) by mouth every 8 (eight) hours as needed. 30 tablet 0   prochlorperazine  (COMPAZINE ) 10 MG tablet Take 1 tablet (10 mg total) by mouth every 6 (six) hours as needed for nausea or vomiting. 30 tablet 0   rosuvastatin (CRESTOR) 5 MG tablet Take 5 mg by mouth daily.     No current facility-administered medications for this visit.   Facility-Administered Medications Ordered in Other Visits  Medication Dose Route Frequency Provider Last Rate Last Admin   0.9 %  sodium chloride  infusion   Intravenous Continuous Ander Bame, MD 10 mL/hr at 02/26/24 1137 New Bag at 02/26/24 1137    REVIEW OF SYSTEMS:   Constitutional: ( - ) fevers, ( - )  chills , ( - ) night sweats Eyes: ( - ) blurriness of vision, ( - ) double vision, ( - ) watery eyes Ears, nose, mouth, throat, and face: ( - ) mucositis, ( - ) sore  throat Respiratory: ( - ) cough, ( - ) dyspnea, ( - ) wheezes Cardiovascular: ( - ) palpitation, ( - ) chest discomfort, ( - ) lower extremity swelling Gastrointestinal:  ( - ) nausea, ( - ) heartburn, ( - ) change in bowel habits Skin: ( - ) abnormal skin rashes Lymphatics: ( - ) new lymphadenopathy, ( - ) easy bruising Neurological: ( - ) numbness, ( - ) tingling, ( - ) new weaknesses Behavioral/Psych: ( - ) mood change, ( - ) new changes  All other systems were reviewed with the patient and are negative.  PHYSICAL EXAMINATION: ECOG PERFORMANCE STATUS: 1 - Symptomatic but completely ambulatory  There were no vitals filed for this visit.  There were no vitals filed for this visit.   Day 15, Cycle 3 02/26/24  Weight 141 lb 12 oz (64.3 kg)  Temp 98.8 F (37.1 C)  Temp src Oral  Pulse 75  Resp 17  BP 138/66     GENERAL: Well-appearing elderly Caucasian female, alert, no distress and comfortable SKIN: skin color, texture, turgor are normal, no rashes or significant lesions EYES: conjunctiva are pink and non-injected, sclera clear LUNGS: clear to auscultation and percussion with normal breathing effort HEART: regular rate & rhythm and no murmurs and no lower extremity edema Musculoskeletal: no cyanosis of digits and no clubbing  PSYCH: alert & oriented x 3, fluent speech NEURO: no focal motor/sensory deficits  LABORATORY DATA:  I have reviewed the data as listed    Latest Ref Rng & Units 02/26/2024   10:45 AM 02/18/2024    1:49 PM 02/11/2024    2:01 PM  CBC  WBC 4.0 - 10.5 K/uL 6.8  6.4  7.9   Hemoglobin 12.0 - 15.0  g/dL 16.1  09.6  04.5   Hematocrit 36.0 - 46.0 % 38.4  40.0  41.0   Platelets 150 - 400 K/uL 164  106  199        Latest Ref Rng & Units 02/26/2024   10:45 AM 02/18/2024    1:49 PM 02/11/2024    2:01 PM  CMP  Glucose 70 - 99 mg/dL 84  409  811   BUN 8 - 23 mg/dL 15  17  17    Creatinine 0.44 - 1.00 mg/dL 9.14  7.82  9.56   Sodium 135 - 145 mmol/L 144  141   141   Potassium 3.5 - 5.1 mmol/L 4.0  4.3  3.9   Chloride 98 - 111 mmol/L 110  108  108   CO2 22 - 32 mmol/L 29  28  27    Calcium 8.9 - 10.3 mg/dL 8.7  9.1  9.1   Total Protein 6.5 - 8.1 g/dL 5.5  6.1  6.2   Total Bilirubin 0.0 - 1.2 mg/dL 1.6  1.6  1.6   Alkaline Phos 38 - 126 U/L 80  83  86   AST 15 - 41 U/L 14  18  19    ALT 0 - 44 U/L 16  19  17      Lab Results  Component Value Date   MPROTEIN 0.1 (H) 01/21/2024   MPROTEIN 0.2 (H) 11/26/2023   MPROTEIN 0.1 (H) 10/29/2023   Lab Results  Component Value Date   KPAFRELGTCHN 1.2 (L) 01/21/2024   KPAFRELGTCHN 3.2 (L) 11/26/2023   KPAFRELGTCHN 3.4 10/29/2023   LAMBDASER 41.5 (H) 01/21/2024   LAMBDASER 79.0 (H) 11/26/2023   LAMBDASER 63.0 (H) 10/29/2023   KAPLAMBRATIO 0.03 (L) 01/21/2024   KAPLAMBRATIO 0.04 (L) 11/26/2023   KAPLAMBRATIO 0.05 (L) 10/29/2023    RADIOGRAPHIC STUDIES: IR IMAGING GUIDED PORT INSERTION Result Date: 02/04/2024 INDICATION: History of multiple myeloma.  Weekly chemo-port EXAM: IMPLANTED PORT A CATH PLACEMENT WITH ULTRASOUND AND FLUOROSCOPIC GUIDANCE MEDICATIONS: None ANESTHESIA/SEDATION: Moderate (conscious) sedation was employed during this procedure. A total of Versed  3 mg and Fentanyl  100 mcg was administered intravenously. Moderate Sedation Time: 20 minutes. The patient's level of consciousness and vital signs were monitored continuously by radiology nursing throughout the procedure under my direct supervision. FLUOROSCOPY TIME:  Fluoroscopic dose; 1 mGy COMPLICATIONS: None immediate. PROCEDURE: The procedure, risks, benefits, and alternatives were explained to the patient. Questions regarding the procedure were encouraged and answered. The patient understands and consents to the procedure. The RIGHT neck and chest were prepped with chlorhexidine in a sterile fashion, and a sterile drape was applied covering the operative field. Maximum barrier sterile technique with sterile gowns and gloves were used for the  procedure. A timeout was performed prior to the initiation of the procedure. Local anesthesia was provided with 1% lidocaine  with epinephrine . After creating a small venotomy incision, a micropuncture kit was utilized to access the internal jugular vein under direct, real-time ultrasound guidance. Ultrasound image documentation was performed. The microwire was kinked to measure appropriate catheter length. A subcutaneous port pocket was then created along the upper chest wall utilizing a combination of sharp and blunt dissection. The pocket was irrigated with sterile saline. A single lumen power injectable port was chosen for placement. The 8 Fr catheter was tunneled from the port pocket site to the venotomy incision. The port was placed in the pocket. The external catheter was trimmed to appropriate length. At the venotomy, an 8 Fr peel-away sheath was placed  over a guidewire under fluoroscopic guidance. The catheter was then placed through the sheath and the sheath was removed. Final catheter positioning was confirmed and documented with a fluoroscopic spot radiograph. The port was accessed with a Huber needle, aspirated and flushed with heparinized saline. The port pocket incision was closed with interrupted 3-0 Vicryl suture then Dermabond was applied, including at the venotomy incision. Dressings were placed. The patient tolerated the procedure well without immediate post procedural complication. IMPRESSION: Successful placement of a RIGHT internal jugular approach power injectable Port-A-Cath. The tip of the catheter is positioned at the superior cavo-atrial junction. the catheter is ready for immediate use. Art Largo, MD [facility] Electronically Signed   By: Art Largo M.D.   On: 02/04/2024 16:52    ASSESSMENT & PLAN Brooke Whitaker is a 73 y.o. female with medical history significant for multiple myeloma who presents for a follow up visit.     # Free Lambda Multiple Myeloma -- Diagnosis confirmed by the presence of a macrocytic anemia and 80% plasma cells in the bone marrow --Initially started with VRD chemotherapy with the patient developed rash as result of the Revlimid  and ocular toxicity from the Velcade .  Dropped the Revlimid  down to 10 mg p.o. daily and transition to Darzalex  for her next treatment. --UPEP showed marked Bence Jones proteins in the urine, no evidence of lytic lesions on metastatic survey PLAN: -- Continue Kyprolis /Dex therapy.  Today is Cycle 3 Day 15 --Labs today show white blood cell 6.8, hemoglobin 12.8, MCV 96.7, platelets 164, creatinine levels normal. Calcium 8.7.  --Most recent myeloma labs from 01/21/2024 shows improvement of lambda light chains from 79.0 to 41.5. Today's labs are pending.  -- Plan for return to clinic in 2 weeks with interval continued Kyprolis  therapy.  #Headaches: --Lasts 1-2 days after each Kyprolis  infusion.  --Not well controlled with Tylenol  1000 mg.  --Sent tramadol 50 mg PO q 6 hours PRN for relief.   #Supportive Care -- chemotherapy education complete -- port placement requested by patient.  Ordered for next week -- ASA 81 mg PO daily to be taken with revlimid .  Can be discontinued after Revlimid  is stopped. -- zofran  8mg  q8H PRN and compazine  10mg  PO q6H for nausea -- acyclovir  400mg  PO BID for VCZ prophylaxis -- EMLA  cream for port --Will discuss the need for Zometa treatment after symptoms of her regimen have stabilized. -- no pain medication required at this time.    Orders Placed This Encounter  Procedures   Kappa/lambda light chains    Standing Status:   Future    Number of Occurrences:   1    Expiration Date:   02/25/2025   Multiple Myeloma Panel (SPEP&IFE w/QIG)    Standing Status:   Future    Number of Occurrences:   1    Expiration Date:   02/25/2025    All questions were answered. The patient knows to call the clinic with any problems, questions or  concerns.   I have spent a total of 30 minutes minutes of face-to-face and non-face-to-face time, preparing to see the patient, performing a medically appropriate examination, counseling and educating the patient, ordering medications/tests/procedures, documenting clinical information in the electronic health record, independently interpreting results and communicating results to the patient, and care coordination.   Wyline Hearing PA-C Dept of Hematology and Oncology [facility] Phone: [phone]   02/26/2024 1:26 PM

## 2024-02-27 ENCOUNTER — Inpatient Hospital Stay

## 2024-02-27 LAB — KAPPA/LAMBDA LIGHT CHAINS
Kappa free light chain: 1.2 mg/L — ABNORMAL LOW (ref 3.3–19.4)
Kappa, lambda light chain ratio: 0.03 — ABNORMAL LOW (ref 0.26–1.65)
Lambda free light chains: 44.7 mg/L — ABNORMAL HIGH (ref 5.7–26.3)

## 2024-02-29 DIAGNOSIS — H01005 Unspecified blepharitis left lower eyelid: Secondary | ICD-10-CM | POA: Diagnosis not present

## 2024-02-29 DIAGNOSIS — H01002 Unspecified blepharitis right lower eyelid: Secondary | ICD-10-CM | POA: Diagnosis not present

## 2024-02-29 DIAGNOSIS — H5203 Hypermetropia, bilateral: Secondary | ICD-10-CM | POA: Diagnosis not present

## 2024-02-29 DIAGNOSIS — H2513 Age-related nuclear cataract, bilateral: Secondary | ICD-10-CM | POA: Diagnosis not present

## 2024-02-29 LAB — MULTIPLE MYELOMA PANEL, SERUM
Albumin SerPl Elph-Mcnc: 3.4 g/dL (ref 2.9–4.4)
Albumin/Glob SerPl: 1.9 — ABNORMAL HIGH (ref 0.7–1.7)
Alpha 1: 0.2 g/dL (ref 0.0–0.4)
Alpha2 Glob SerPl Elph-Mcnc: 0.6 g/dL (ref 0.4–1.0)
B-Globulin SerPl Elph-Mcnc: 0.8 g/dL (ref 0.7–1.3)
Gamma Glob SerPl Elph-Mcnc: 0.2 g/dL — ABNORMAL LOW (ref 0.4–1.8)
Globulin, Total: 1.8 g/dL — ABNORMAL LOW (ref 2.2–3.9)
IgA: <5 mg/dL — ABNORMAL LOW (ref 64–422)
IgG (Immunoglobin G), Serum: 207 mg/dL — ABNORMAL LOW (ref 586–1602)
IgM (Immunoglobulin M), Srm: <5 mg/dL — ABNORMAL LOW (ref 26–217)
M Protein SerPl Elph-Mcnc: 0.1 g/dL — ABNORMAL HIGH
Total Protein ELP: 5.2 g/dL — ABNORMAL LOW (ref 6.0–8.5)

## 2024-03-05 ENCOUNTER — Other Ambulatory Visit

## 2024-03-10 NOTE — Progress Notes (Unsigned)
 [facility] [phone]   [phone]  PROGRESS NOTE  Patient Care Team: Aldo Hun, MD as PCP - General (Internal Medicine)  Hematological/Oncological History # Free Lambda Multiple Myeloma 11/10/2022: WBC 5.68, Hgb 11.7, MCV 97.4, Plt 146 04/17/2023: WBC 4.53, Hgb 11.5, MCV 107.6, Plt 110 05/16/2023: establish care with Dr. Rosaline Coma. Labs showed M protein 0.1 with Lambda 900, Kappa 3 with ratio 0.00.   06/01/2023: bone marrow biopsy performed, showed plasma cell myeloma nearly effacing approximately 80% of the cellular marrow.  06/25/2023: Cycle 1 Day 1 of VRD chemotherapy 07/23/2023: Cycle 2 Day 1 of VRD chemotherapy 08/13/2023: Cycle 3 Day 1 of VRD chemotherapy 09/03/2023: Cycle 1 Day 1 of Dara/Rev/Dex. 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 12/17/2023: Cycle 1 Day 1 of Kyprolis . Dara/Rev ineffective.   Interval History:  Brooke Whitaker 73 y.o. female with medical history significant for newly diagnosed multiple myeloma who presents for a follow up visit. The patient's last visit was on 02/26/2024. In the interim since the last visit she has had no major changes in her health.  On exam today Brooke Whitaker reports she has been doing well overall in interim since her last visit.  She reports her port access is occasionally painful but she has been using the lidocaine  cream for it.  She is not having any nausea or vomiting does have her chronic stomach upset with her diarrhea being "up-and-down".  She notes that she is trying to avoid constipation as well.  She denies any numbness or tingling of her fingers and toes.  She reports her energy levels are "okay".  She notes that she is able to keep up with her young grandson and she is glad her energy is strong enough for that.  She reports her appetite has been good.  She recently did start a new blood pressure medication with no lightheadedness, dizziness, or shortness of breath.  Overall she feels well and is  willing able to proceed with Kyprolis  therapy at this time.  A full 10 point ROS is otherwise negative.   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    Sigmoid diverticulosis    Wears glasses     SURGICAL HISTORY: Past Surgical History:  Procedure Laterality Date   BREAST BIOPSY Right 09/2011   benign    COLONOSCOPY  2010   CYSTOSCOPY/RETROGRADE/URETEROSCOPY/STONE EXTRACTION WITH BASKET Left 04/21/2014   Procedure: cystoscopy, left retrograde pylegram, LEFT URETEROSCOPY, laser lithotripsy with holmium laser, STONE EXTRACTION, uretheral calibration;  Surgeon: Willye Harvey, MD;  Location: [facility];  Service: Urology;  Laterality: Left;   DILATION AND EVACUATION  1995   EPISIOTOMY REPAIR, SEVERE TEAR  1989   IR IMAGING GUIDED PORT INSERTION  02/04/2024   LAPAROSCOPIC OVARIAN CYSTECTOMY  10/ 2000   NM MYOCAR PERF WALL MOTION  12/15/2009   Normal   US  ECHOCARDIOGRAPHY  12/15/2009   Normal study for age: mild MR,TR and trace PI    SOCIAL HISTORY: Social History   Socioeconomic History   Marital status: Married    Spouse name: Not on file   Number of children: 2   Years of education: Not on file   Highest education level: Not on file  Occupational History   Occupation:  Print production planner  Tobacco Use   Smoking status: Never    Passive exposure: Never   Smokeless tobacco: Never  Vaping Use   Vaping status: Never Used  Substance and Sexual Activity   Alcohol  use: Yes    Alcohol /week: 2.0 standard drinks of alcohol     Types: 2 Glasses of wine per week   Drug use: No   Sexual activity: Not Currently    Partners: Male  Other Topics Concern   Not on file  Social History Narrative   Not on file   Social Drivers of Health   Financial Resource Strain: Not on file  Food Insecurity: No  Food Insecurity (05/16/2023)   Hunger Vital Sign    Worried About Running Out of Food in the Last Year: Never true    Ran Out of Food in the Last Year: Never true  Transportation Needs: No Transportation Needs (05/16/2023)   PRAPARE - Administrator, Civil Service (Medical): No    Lack of Transportation (Non-Medical): No  Physical Activity: Not on file  Stress: Not on file  Social Connections: Not on file  Intimate Partner Violence: Not At Risk (05/16/2023)   Humiliation, Afraid, Rape, and Kick questionnaire    Fear of Current or Ex-Partner: No    Emotionally Abused: No    Physically Abused: No    Sexually Abused: No    FAMILY HISTORY: Family History  Problem Relation Age of Onset   Diabetes Mother    Congestive Heart Failure Mother    Stroke Mother    Osteoporosis Mother    Other Mother        gallbladder ruptured   Heart attack Father    Diabetes Sister    Heart disease Sister    Heart disease Sister        x2   Hypertension Sister    Thyroid  disease Sister    Hypertension Sister    Diabetes Brother    Dementia Brother    Colon cancer Brother        mets to liver   Heart disease Brother        pacemaker   Stroke Brother    Liver cancer Brother    Heart disease Brother    Stomach cancer Neg Hx    Pancreatic cancer Neg Hx    Esophageal cancer Neg Hx     ALLERGIES:  is allergic to penicillins and sulfa antibiotics.  MEDICATIONS:  Current Outpatient Medications  Medication Sig Dispense Refill   lidocaine -prilocaine  (EMLA ) cream Apply 1 Application topically as needed. 30 g 1   acetaminophen  (TYLENOL ) 500 MG tablet Take 500 mg by mouth every 6 (six) hours as needed for mild pain or headache.      acyclovir  (ZOVIRAX ) 400 MG tablet Take 1 tablet (400 mg total) by mouth 2 (two) times daily. 180 tablet 1   allopurinol  (ZYLOPRIM ) 300 MG tablet Take 1 tablet (300 mg total) by mouth daily. (Patient not taking: Reported on 12/25/2023) 90 tablet 1   aspirin EC 81  MG tablet Take 81 mg by mouth daily. Swallow whole. (Patient not taking: Reported on 12/25/2023)     cetirizine (ZYRTEC) 5 MG tablet Take 5 mg by mouth daily as needed for allergies.     cholecalciferol (VITAMIN D ) 1000 UNITS tablet Take 1,000 Units by mouth every morning.      Cyanocobalamin  (VITAMIN B 12 PO) Take 1,000 mcg by mouth daily.     dexamethasone  (DECADRON ) 4 MG tablet Take  10 tablets (40 mg total) by mouth once a week. Take once weekly on the day you receive the chemotherapy injection. 40 tablet 5   doxycycline (MONODOX) 100 MG capsule Take 100 mg by mouth 2 (two) times daily. For 7 days     ELDERBERRY PO Take 1 tablet by mouth daily.     Elderberry-Vitamin C-Zinc (ELDERBERRY IMMUNE HEALTH GUMMY PO) Take by mouth daily.     fluticasone (FLONASE) 50 MCG/ACT nasal spray Place into the nose as needed.     levocetirizine (XYZAL) 5 MG tablet Take 5 mg by mouth every evening.     multivitamin-lutein (OCUVITE-LUTEIN) CAPS capsule Take 1 capsule by mouth daily.     ondansetron  (ZOFRAN ) 8 MG tablet Take 1 tablet (8 mg total) by mouth every 8 (eight) hours as needed. 30 tablet 0   prochlorperazine  (COMPAZINE ) 10 MG tablet Take 1 tablet (10 mg total) by mouth every 6 (six) hours as needed for nausea or vomiting. 30 tablet 0   rosuvastatin (CRESTOR) 5 MG tablet Take 5 mg by mouth daily.     traMADol  (ULTRAM ) 50 MG tablet Take 1 tablet (50 mg total) by mouth every 6 (six) hours as needed. 30 tablet 0   No current facility-administered medications for this visit.   Facility-Administered Medications Ordered in Other Visits  Medication Dose Route Frequency Provider Last Rate Last Admin   0.9 %  sodium chloride  infusion   Intravenous Continuous Ander Bame, MD   Stopped at 03/11/24 1315   sodium chloride  flush (NS) 0.9 % injection 10 mL  10 mL Intracatheter PRN Keefe Zawistowski T IV, MD   10 mL at 03/11/24 1309    REVIEW OF SYSTEMS:   Constitutional: ( - ) fevers, ( - )  chills , ( - ) night  sweats Eyes: ( - ) blurriness of vision, ( - ) double vision, ( - ) watery eyes Ears, nose, mouth, throat, and face: ( - ) mucositis, ( - ) sore throat Respiratory: ( - ) cough, ( - ) dyspnea, ( - ) wheezes Cardiovascular: ( - ) palpitation, ( - ) chest discomfort, ( - ) lower extremity swelling Gastrointestinal:  ( - ) nausea, ( - ) heartburn, ( - ) change in bowel habits Skin: ( - ) abnormal skin rashes Lymphatics: ( - ) new lymphadenopathy, ( - ) easy bruising Neurological: ( - ) numbness, ( - ) tingling, ( - ) new weaknesses Behavioral/Psych: ( - ) mood change, ( - ) new changes  All other systems were reviewed with the patient and are negative.  PHYSICAL EXAMINATION: ECOG PERFORMANCE STATUS: 0 - Asymptomatic  Vitals:   03/11/24 1120  BP: (!) 145/68  Pulse: 76  Resp: 14  Temp: 99.5 F (37.5 C)  SpO2: 98%   Filed Weights   03/11/24 1120  Weight: 142 lb 3.2 oz (64.5 kg)    GENERAL: Well-appearing elderly Caucasian female, alert, no distress and comfortable SKIN: skin color, texture, turgor are normal, no rashes or significant lesions EYES: conjunctiva are pink and non-injected, sclera clear LUNGS: clear to auscultation and percussion with normal breathing effort HEART: regular rate & rhythm and no murmurs and no lower extremity edema Musculoskeletal: no cyanosis of digits and no clubbing  PSYCH: alert & oriented x 3, fluent speech NEURO: no focal motor/sensory deficits  LABORATORY DATA:  I have reviewed the data as listed    Latest Ref Rng & Units 03/11/2024   10:44 AM 02/26/2024  10:45 AM 02/18/2024    1:49 PM  CBC  WBC 4.0 - 10.5 K/uL 6.1  6.8  6.4   Hemoglobin 12.0 - 15.0 g/dL 40.9  81.1  91.4   Hematocrit 36.0 - 46.0 % 38.9  38.4  40.0   Platelets 150 - 400 K/uL 185  164  106        Latest Ref Rng & Units 03/11/2024   10:44 AM 02/26/2024   10:45 AM 02/18/2024    1:49 PM  CMP  Glucose 70 - 99 mg/dL 782  84  956   BUN 8 - 23 mg/dL 15  15  17    Creatinine 0.44  - 1.00 mg/dL 2.13  0.86  5.78   Sodium 135 - 145 mmol/L 144  144  141   Potassium 3.5 - 5.1 mmol/L 3.8  4.0  4.3   Chloride 98 - 111 mmol/L 110  110  108   CO2 22 - 32 mmol/L 29  29  28    Calcium 8.9 - 10.3 mg/dL 8.9  8.7  9.1   Total Protein 6.5 - 8.1 g/dL 5.7  5.5  6.1   Total Bilirubin 0.0 - 1.2 mg/dL 1.8  1.6  1.6   Alkaline Phos 38 - 126 U/L 79  80  83   AST 15 - 41 U/L 16  14  18    ALT 0 - 44 U/L 15  16  19      Lab Results  Component Value Date   MPROTEIN 0.1 (H) 02/26/2024   MPROTEIN 0.1 (H) 01/21/2024   MPROTEIN 0.2 (H) 11/26/2023   Lab Results  Component Value Date   KPAFRELGTCHN 1.2 (L) 02/26/2024   KPAFRELGTCHN 1.2 (L) 01/21/2024   KPAFRELGTCHN 3.2 (L) 11/26/2023   LAMBDASER 44.7 (H) 02/26/2024   LAMBDASER 41.5 (H) 01/21/2024   LAMBDASER 79.0 (H) 11/26/2023   KAPLAMBRATIO 0.03 (L) 02/26/2024   KAPLAMBRATIO 0.03 (L) 01/21/2024   KAPLAMBRATIO 0.04 (L) 11/26/2023    RADIOGRAPHIC STUDIES: No results found.   ASSESSMENT & PLAN YOUSRA IVENS 73 y.o. female with medical history significant for newly diagnosed multiple myeloma who presents for a follow up visit.   Previously we discussed the diagnosis of multiple myeloma.  We discussed that this is a blood cancer that is not curable.  The goal is to get the patient into a durable remission with treatment.  We discussed the need for a minimum of 6 months of chemotherapy treatment then followed by maintenance therapy if the M protein has reached 0 and the free light chains are within normal limits.  We discussed expected side effects of the regimen and the plan moving forward.  The patient voiced her understanding of our findings, expected side effects, and risks/benefits.  She is okay with proceeding with chemotherapy at this time.  # Free Lambda Multiple Myeloma -- Diagnosis confirmed by the presence of a macrocytic anemia and 80% plasma cells in the bone marrow --Initially started with VRD chemotherapy with the patient  developed rash as result of the Revlimid  and ocular toxicity from the Velcade .  Dropped the Revlimid  down to 10 mg p.o. daily and transition to Darzalex  for her next treatment. --UPEP showed marked Bence Jones proteins in the urine, no evidence of lytic lesions on metastatic survey PLAN: -- Continue Kyprolis /Dex therapy.  Today is Cycle 4 Day 1  --Labs today show white blood cell 6.1, hemoglobin 12.8, MCV 98.7, platelets 185  --On 02/26/2024 patient had kappa 1.2, lambda 44.7, ratio 0.03  with an M protein of 0.1 -- Plan for return to clinic in 2 weeks with interval continued Kyprolis  therapy.  #Supportive Care -- chemotherapy education complete -- port placed -- zofran  8mg  q8H PRN and compazine  10mg  PO q6H for nausea -- acyclovir  400mg  PO BID for VCZ prophylaxis -- EMLA  cream for port -- Zometa to be added after dental clearance.  -- no pain medication required at this time.    No orders of the defined types were placed in this encounter.   All questions were answered. The patient knows to call the clinic with any problems, questions or concerns.  A total of more than 30 minutes were spent on this encounter with face-to-face time and non-face-to-face time, including preparing to see the patient, ordering tests and/or medications, counseling the patient and coordination of care as outlined above.   Rogerio Clay, MD Department of Hematology/Oncology [facility] Phone: [phone] Pager: [phone] Email: [email or web address]  03/11/2024 1:22 PM

## 2024-03-11 ENCOUNTER — Inpatient Hospital Stay: Admitting: Hematology and Oncology

## 2024-03-11 ENCOUNTER — Inpatient Hospital Stay

## 2024-03-11 ENCOUNTER — Encounter: Payer: Self-pay | Admitting: Hematology and Oncology

## 2024-03-11 VITALS — BP 145/68 | HR 76 | Temp 99.5°F | Resp 14 | Wt 142.2 lb

## 2024-03-11 VITALS — Temp 98.4°F

## 2024-03-11 DIAGNOSIS — D696 Thrombocytopenia, unspecified: Secondary | ICD-10-CM | POA: Diagnosis not present

## 2024-03-11 DIAGNOSIS — Z5112 Encounter for antineoplastic immunotherapy: Secondary | ICD-10-CM | POA: Diagnosis not present

## 2024-03-11 DIAGNOSIS — Z95828 Presence of other vascular implants and grafts: Secondary | ICD-10-CM

## 2024-03-11 DIAGNOSIS — C9000 Multiple myeloma not having achieved remission: Secondary | ICD-10-CM

## 2024-03-11 LAB — CBC WITH DIFFERENTIAL (CANCER CENTER ONLY)
Abs Immature Granulocytes: 0.01 10*3/uL (ref 0.00–0.07)
Basophils Absolute: 0 10*3/uL (ref 0.0–0.1)
Basophils Relative: 1 %
Eosinophils Absolute: 0.1 10*3/uL (ref 0.0–0.5)
Eosinophils Relative: 1 %
HCT: 38.9 % (ref 36.0–46.0)
Hemoglobin: 12.8 g/dL (ref 12.0–15.0)
Immature Granulocytes: 0 %
Lymphocytes Relative: 33 %
Lymphs Abs: 2 10*3/uL (ref 0.7–4.0)
MCH: 32.5 pg (ref 26.0–34.0)
MCHC: 32.9 g/dL (ref 30.0–36.0)
MCV: 98.7 fL (ref 80.0–100.0)
Monocytes Absolute: 0.4 10*3/uL (ref 0.1–1.0)
Monocytes Relative: 6 %
Neutro Abs: 3.6 10*3/uL (ref 1.7–7.7)
Neutrophils Relative %: 59 %
Platelet Count: 185 10*3/uL (ref 150–400)
RBC: 3.94 MIL/uL (ref 3.87–5.11)
RDW: 13.4 % (ref 11.5–15.5)
WBC Count: 6.1 10*3/uL (ref 4.0–10.5)
nRBC: 0 % (ref 0.0–0.2)

## 2024-03-11 LAB — CMP (CANCER CENTER ONLY)
ALT: 15 U/L (ref 0–44)
AST: 16 U/L (ref 15–41)
Albumin: 4.1 g/dL (ref 3.5–5.0)
Alkaline Phosphatase: 79 U/L (ref 38–126)
Anion gap: 5 (ref 5–15)
BUN: 15 mg/dL (ref 8–23)
CO2: 29 mmol/L (ref 22–32)
Calcium: 8.9 mg/dL (ref 8.9–10.3)
Chloride: 110 mmol/L (ref 98–111)
Creatinine: 0.56 mg/dL (ref 0.44–1.00)
GFR, Estimated: >60 mL/min (ref >60)
Glucose, Bld: 109 mg/dL — ABNORMAL HIGH (ref 70–99)
Potassium: 3.8 mmol/L (ref 3.5–5.1)
Sodium: 144 mmol/L (ref 135–145)
Total Bilirubin: 1.8 mg/dL — ABNORMAL HIGH (ref 0.0–1.2)
Total Protein: 5.7 g/dL — ABNORMAL LOW (ref 6.5–8.1)

## 2024-03-11 MED ORDER — SODIUM CHLORIDE 0.9% FLUSH
10.0000 mL | Freq: Once | INTRAVENOUS | Status: AC
  Administered 2024-03-11: 10 mL

## 2024-03-11 MED ORDER — SODIUM CHLORIDE 0.9 % IV SOLN: INTRAVENOUS | Status: DC

## 2024-03-11 MED ORDER — SODIUM CHLORIDE 0.9% FLUSH
10.0000 mL | INTRAVENOUS | Status: DC | PRN
  Administered 2024-03-11: 10 mL

## 2024-03-11 MED ORDER — SODIUM CHLORIDE 0.9 % IV SOLN: Freq: Once | INTRAVENOUS | Status: AC

## 2024-03-11 MED ORDER — DEXTROSE 5 % IV SOLN
70.0000 mg/m2 | Freq: Once | INTRAVENOUS | Status: AC
  Administered 2024-03-11: 120 mg via INTRAVENOUS
  Filled 2024-03-11: qty 60

## 2024-03-11 MED ORDER — HEPARIN SOD (PORK) LOCK FLUSH 100 UNIT/ML IV SOLN
500.0000 [IU] | Freq: Once | INTRAVENOUS | Status: AC | PRN
  Administered 2024-03-11: 500 [IU]

## 2024-03-11 NOTE — Patient Instructions (Addendum)

## 2024-03-11 NOTE — Progress Notes (Signed)
 Per Dr. Rosaline Coma, OK to treat with total bilirubin 1.8.  Patient states she took her dexamethasone  prior to appointment.

## 2024-03-13 ENCOUNTER — Telehealth: Payer: Self-pay | Admitting: Dietician

## 2024-03-13 ENCOUNTER — Inpatient Hospital Stay: Admitting: Dietician

## 2024-03-13 ENCOUNTER — Other Ambulatory Visit: Payer: Self-pay

## 2024-03-13 NOTE — Progress Notes (Signed)
 Nutrition Assessment: Reached out to patient at mobile telephone number after she return call.    Reason for Assessment: Patient requesting MD make referral for general tips on nutritional choices and navigating taste changes    ASSESSMENT: Patient is a 73 y.o. female with recently diagnosed multiple myeloma.  She has PMHx that includes HLD, kidney stones, and previous chronic diarrhea. She reports she's not comfortable with current weight and would like to lose some weight.  Some foods no longer enticing due to taste challenges and finds that spicy foods seems to work better (taco or spaghetti taste better).   "need to eat more fruits and vegetables have a very bad sweet tooth."  Usual intake:  Making herself eat breakfast Lunch: can't think what she wants, tuna or chicken salad Dinner : varies Fluids: Coffee in water , Fairlife milk,  and mostly water , also drinks white wine (couple a week on weekend, doesn't tolerate red anymore)  Nutrition Focused Physical Exam: unable to perform NFPE   Medications: Vit D, Vit B12, and lutein, takes elderberry supplement mostly in winter   Labs: reviewed 03/11/24   Anthropometrics: stable within 139-143# range in 2025  Height: 61# Weight: 142.3# DBW: 125# BMI: 26.87   Estimated Energy Needs  Kcals: 1625-1900 Protein: 78-98g Fluid: 2L   NUTRITION DIAGNOSIS: Food and Nutrition Related Knowledge Deficit related to cancer and associated treatments as evidenced by no prior need for nutrition related information.   INTERVENTION:   Relayed that nutrition services are wrap around service provided at no charge and encouraged continued communication if experiencing any nutritional impact symptoms (NIS). Educated on importance of adequate nourishment with calorie and protein energy intake with nutrient dense foods when possible to maintain LBM and QOL.   Emphasized slow weight loss 2# or less per month to preserve LBM. Encouraged protein pacing  and trying a protein bar in morning to reduce sweet cravings later. Encouraged more plant based foods and sweets. Encouraged Fairlife as oral nutrition supplement Discussed alcohol  and risk reduction for future cancers. Emailed AICR guidelines with contact information provided.  MONITORING, EVALUATION, GOAL: weight trends, nutrition impact symptoms, PO intake, labs  Patient's goal is weight loss.  Next Visit: PRN at patient or provider request.  Carleen Chary, RDN, LDN Registered Dietitian, [facility] Part Time Remote (Usual office hours: Tuesday-Thursday) Mobile: [phone]

## 2024-03-13 NOTE — Telephone Encounter (Signed)
 Patient referred for experiencing taste changes and desiring general tips on nutritional choices and navigating taste changes.  First attempt to reach. Provided my cell# on voice mail and in text to mobile to return call to set up a nutrition consult. Home # no longer in service.  Carleen Chary, RDN, LDN Registered Dietitian, [facility] Part Time Remote (Usual office hours: Tuesday-Thursday) Cell: [phone]

## 2024-03-14 ENCOUNTER — Ambulatory Visit (HOSPITAL_BASED_OUTPATIENT_CLINIC_OR_DEPARTMENT_OTHER): Payer: HMO | Admitting: Obstetrics & Gynecology

## 2024-03-17 ENCOUNTER — Inpatient Hospital Stay: Attending: Hematology and Oncology

## 2024-03-17 ENCOUNTER — Other Ambulatory Visit: Payer: Self-pay

## 2024-03-17 ENCOUNTER — Inpatient Hospital Stay

## 2024-03-17 ENCOUNTER — Other Ambulatory Visit

## 2024-03-17 VITALS — BP 156/76 | HR 94 | Temp 98.2°F | Resp 16 | Wt 141.2 lb

## 2024-03-17 DIAGNOSIS — Z95828 Presence of other vascular implants and grafts: Secondary | ICD-10-CM

## 2024-03-17 DIAGNOSIS — C9000 Multiple myeloma not having achieved remission: Secondary | ICD-10-CM

## 2024-03-17 DIAGNOSIS — Z79899 Other long term (current) drug therapy: Secondary | ICD-10-CM | POA: Insufficient documentation

## 2024-03-17 DIAGNOSIS — Z5112 Encounter for antineoplastic immunotherapy: Secondary | ICD-10-CM | POA: Insufficient documentation

## 2024-03-17 LAB — CBC WITH DIFFERENTIAL (CANCER CENTER ONLY)
Abs Immature Granulocytes: 0.04 10*3/uL (ref 0.00–0.07)
Basophils Absolute: 0 10*3/uL (ref 0.0–0.1)
Basophils Relative: 0 %
Eosinophils Absolute: 0 10*3/uL (ref 0.0–0.5)
Eosinophils Relative: 0 %
HCT: 39.6 % (ref 36.0–46.0)
Hemoglobin: 13.3 g/dL (ref 12.0–15.0)
Immature Granulocytes: 1 %
Lymphocytes Relative: 11 %
Lymphs Abs: 0.8 10*3/uL (ref 0.7–4.0)
MCH: 33 pg (ref 26.0–34.0)
MCHC: 33.6 g/dL (ref 30.0–36.0)
MCV: 98.3 fL (ref 80.0–100.0)
Monocytes Absolute: 0.1 10*3/uL (ref 0.1–1.0)
Monocytes Relative: 1 %
Neutro Abs: 6.4 10*3/uL (ref 1.7–7.7)
Neutrophils Relative %: 87 %
Platelet Count: 118 10*3/uL — ABNORMAL LOW (ref 150–400)
RBC: 4.03 MIL/uL (ref 3.87–5.11)
RDW: 13.2 % (ref 11.5–15.5)
WBC Count: 7.3 10*3/uL (ref 4.0–10.5)
nRBC: 0.3 % — ABNORMAL HIGH (ref 0.0–0.2)

## 2024-03-17 LAB — CMP (CANCER CENTER ONLY)
ALT: 18 U/L (ref 0–44)
AST: 17 U/L (ref 15–41)
Albumin: 4.5 g/dL (ref 3.5–5.0)
Alkaline Phosphatase: 83 U/L (ref 38–126)
Anion gap: 7 (ref 5–15)
BUN: 12 mg/dL (ref 8–23)
CO2: 25 mmol/L (ref 22–32)
Calcium: 9.1 mg/dL (ref 8.9–10.3)
Chloride: 107 mmol/L (ref 98–111)
Creatinine: 0.57 mg/dL (ref 0.44–1.00)
GFR, Estimated: >60 mL/min (ref >60)
Glucose, Bld: 124 mg/dL — ABNORMAL HIGH (ref 70–99)
Potassium: 3.8 mmol/L (ref 3.5–5.1)
Sodium: 139 mmol/L (ref 135–145)
Total Bilirubin: 2.6 mg/dL — ABNORMAL HIGH (ref 0.0–1.2)
Total Protein: 6.3 g/dL — ABNORMAL LOW (ref 6.5–8.1)

## 2024-03-17 MED ORDER — HEPARIN SOD (PORK) LOCK FLUSH 100 UNIT/ML IV SOLN
500.0000 [IU] | Freq: Once | INTRAVENOUS | Status: DC | PRN
Start: 2024-03-17 — End: 2024-03-17

## 2024-03-17 MED ORDER — SODIUM CHLORIDE 0.9% FLUSH: 10.0000 mL | INTRAVENOUS | Status: DC | PRN

## 2024-03-17 MED ORDER — DEXTROSE 5 % IV SOLN
70.0000 mg/m2 | Freq: Once | INTRAVENOUS | Status: AC
  Administered 2024-03-17: 120 mg via INTRAVENOUS
  Filled 2024-03-17: qty 60

## 2024-03-17 MED ORDER — SODIUM CHLORIDE 0.9 % IV SOLN: Freq: Once | INTRAVENOUS | Status: AC

## 2024-03-17 MED ORDER — SODIUM CHLORIDE 0.9 % IV SOLN: INTRAVENOUS | Status: DC

## 2024-03-17 MED ORDER — SODIUM CHLORIDE 0.9% FLUSH
10.0000 mL | Freq: Once | INTRAVENOUS | Status: AC
  Administered 2024-03-17: 10 mL

## 2024-03-17 NOTE — Patient Instructions (Signed)
 [facility] - A DEPT OF [facility]  Discharge Instructions: Thank you for choosing [facility] to provide your oncology and hematology care.   If you have a lab appointment with the Cancer Center, please go directly to the Cancer Center and check in at the registration area.   Wear comfortable clothing and clothing appropriate for easy access to any Portacath or PICC line.   We strive to give you quality time with your provider. You may need to reschedule your appointment if you arrive late (15 or more minutes).  Arriving late affects you and other patients whose appointments are after yours.  Also, if you miss three or more appointments without notifying the office, you may be dismissed from the clinic at the provider's discretion.      For prescription refill requests, have your pharmacy contact our office and allow 72 hours for refills to be completed.    Today you received the following chemotherapy and/or immunotherapy agents kyprolis      To help prevent nausea and vomiting after your treatment, we encourage you to take your nausea medication as directed.  BELOW ARE SYMPTOMS THAT SHOULD BE REPORTED IMMEDIATELY: *FEVER GREATER THAN 100.4 F (38 C) OR HIGHER *CHILLS OR SWEATING *NAUSEA AND VOMITING THAT IS NOT CONTROLLED WITH YOUR NAUSEA MEDICATION *UNUSUAL SHORTNESS OF BREATH *UNUSUAL BRUISING OR BLEEDING *URINARY PROBLEMS (pain or burning when urinating, or frequent urination) *BOWEL PROBLEMS (unusual diarrhea, constipation, pain near the anus) TENDERNESS IN MOUTH AND THROAT WITH OR WITHOUT PRESENCE OF ULCERS (sore throat, sores in mouth, or a toothache) UNUSUAL RASH, SWELLING OR PAIN  UNUSUAL VAGINAL DISCHARGE OR ITCHING   Items with * indicate a potential emergency and should be followed up as soon as possible or go to the Emergency Department if any problems should occur.  Please show the CHEMOTHERAPY ALERT CARD or IMMUNOTHERAPY  ALERT CARD at check-in to the Emergency Department and triage nurse.  Should you have questions after your visit or need to cancel or reschedule your appointment, please contact [facility] - A DEPT OF [facility]  Dept: [phone]  and follow the prompts.  Office hours are 8:00 a.m. to 4:30 p.m. Monday - Friday. Please note that voicemails left after 4:00 p.m. may not be returned until the following business day.  We are closed weekends and major holidays. You have access to a nurse at all times for urgent questions. Please call the main number to the clinic Dept: [phone] and follow the prompts.   For any non-urgent questions, you may also contact your provider using MyChart. We now offer e-Visits for anyone 80 and older to request care online for non-urgent symptoms. For details visit [email or web address].   Also download the MyChart app! Go to the app store, search "MyChart", open the app, select [facility], and log in with your MyChart username and password.

## 2024-03-24 ENCOUNTER — Other Ambulatory Visit

## 2024-03-24 ENCOUNTER — Encounter: Payer: Self-pay | Admitting: Hematology and Oncology

## 2024-03-24 ENCOUNTER — Inpatient Hospital Stay: Admitting: Hematology and Oncology

## 2024-03-24 ENCOUNTER — Inpatient Hospital Stay

## 2024-03-24 VITALS — BP 148/82 | HR 89 | Temp 97.9°F | Resp 16 | Wt 147.0 lb

## 2024-03-24 DIAGNOSIS — Z95828 Presence of other vascular implants and grafts: Secondary | ICD-10-CM

## 2024-03-24 DIAGNOSIS — C9000 Multiple myeloma not having achieved remission: Secondary | ICD-10-CM

## 2024-03-24 DIAGNOSIS — Z5112 Encounter for antineoplastic immunotherapy: Secondary | ICD-10-CM | POA: Diagnosis not present

## 2024-03-24 LAB — CMP (CANCER CENTER ONLY)
ALT: 18 U/L (ref 0–44)
AST: 18 U/L (ref 15–41)
Albumin: 4.4 g/dL (ref 3.5–5.0)
Alkaline Phosphatase: 87 U/L (ref 38–126)
Anion gap: 9 (ref 5–15)
BUN: 22 mg/dL (ref 8–23)
CO2: 25 mmol/L (ref 22–32)
Calcium: 9 mg/dL (ref 8.9–10.3)
Chloride: 107 mmol/L (ref 98–111)
Creatinine: 0.62 mg/dL (ref 0.44–1.00)
GFR, Estimated: >60 mL/min (ref >60)
Glucose, Bld: 162 mg/dL — ABNORMAL HIGH (ref 70–99)
Potassium: 4 mmol/L (ref 3.5–5.1)
Sodium: 141 mmol/L (ref 135–145)
Total Bilirubin: 1.6 mg/dL — ABNORMAL HIGH (ref 0.0–1.2)
Total Protein: 6 g/dL — ABNORMAL LOW (ref 6.5–8.1)

## 2024-03-24 LAB — CBC WITH DIFFERENTIAL (CANCER CENTER ONLY)
Abs Immature Granulocytes: 0.05 K/uL (ref 0.00–0.07)
Basophils Absolute: 0 K/uL (ref 0.0–0.1)
Basophils Relative: 0 %
Eosinophils Absolute: 0 K/uL (ref 0.0–0.5)
Eosinophils Relative: 0 %
HCT: 39.3 % (ref 36.0–46.0)
Hemoglobin: 13.2 g/dL (ref 12.0–15.0)
Immature Granulocytes: 0 %
Lymphocytes Relative: 7 %
Lymphs Abs: 0.9 K/uL (ref 0.7–4.0)
MCH: 33.2 pg (ref 26.0–34.0)
MCHC: 33.6 g/dL (ref 30.0–36.0)
MCV: 99 fL (ref 80.0–100.0)
Monocytes Absolute: 0.1 K/uL (ref 0.1–1.0)
Monocytes Relative: 1 %
Neutro Abs: 11.6 K/uL — ABNORMAL HIGH (ref 1.7–7.7)
Neutrophils Relative %: 92 %
Platelet Count: 164 K/uL (ref 150–400)
RBC: 3.97 MIL/uL (ref 3.87–5.11)
RDW: 12.8 % (ref 11.5–15.5)
WBC Count: 12.6 K/uL — ABNORMAL HIGH (ref 4.0–10.5)
nRBC: 0 % (ref 0.0–0.2)

## 2024-03-24 MED ORDER — DEXTROSE 5 % IV SOLN
70.0000 mg/m2 | Freq: Once | INTRAVENOUS | Status: AC
  Administered 2024-03-24: 120 mg via INTRAVENOUS
  Filled 2024-03-24: qty 60

## 2024-03-24 MED ORDER — SODIUM CHLORIDE 0.9 % IV SOLN: Freq: Once | INTRAVENOUS | Status: AC

## 2024-03-24 MED ORDER — SODIUM CHLORIDE 0.9% FLUSH: 10.0000 mL | INTRAVENOUS | Status: DC | PRN

## 2024-03-24 MED ORDER — SODIUM CHLORIDE 0.9% FLUSH
10.0000 mL | Freq: Once | INTRAVENOUS | Status: AC
  Administered 2024-03-24: 10 mL

## 2024-03-24 MED ORDER — HEPARIN SOD (PORK) LOCK FLUSH 100 UNIT/ML IV SOLN
500.0000 [IU] | Freq: Once | INTRAVENOUS | Status: DC | PRN
Start: 2024-03-24 — End: 2024-03-24

## 2024-03-24 MED ORDER — HYDROCODONE-ACETAMINOPHEN 5-325 MG PO TABS
1.0000 | ORAL_TABLET | Freq: Four times a day (QID) | ORAL | 0 refills | Status: DC | PRN
Start: 2024-03-24 — End: 2024-05-19

## 2024-03-24 MED ORDER — SODIUM CHLORIDE 0.9 % IV SOLN: INTRAVENOUS | Status: DC

## 2024-03-24 NOTE — Progress Notes (Unsigned)
 [facility] [phone]   [phone]  PROGRESS NOTE  Patient Care Team: Aldo Hun, MD as PCP - General (Internal Medicine)  Hematological/Oncological History # Free Lambda Multiple Myeloma 11/10/2022: WBC 5.68, Hgb 11.7, MCV 97.4, Plt 146 04/17/2023: WBC 4.53, Hgb 11.5, MCV 107.6, Plt 110 05/16/2023: establish care with Dr. Rosaline Coma. Labs showed M protein 0.1 with Lambda 900, Kappa 3 with ratio 0.00.   06/01/2023: bone marrow biopsy performed, showed plasma cell myeloma nearly effacing approximately 80% of the cellular marrow.  06/25/2023: Cycle 1 Day 1 of VRD chemotherapy 07/23/2023: Cycle 2 Day 1 of VRD chemotherapy 08/13/2023: Cycle 3 Day 1 of VRD chemotherapy 09/03/2023: Cycle 1 Day 1 of Dara/Rev/Dex. 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 12/17/2023: Cycle 1 Day 1 of Kyprolis . Dara/Rev ineffective.   Interval History:  Brooke Whitaker 73 y.o. female with medical history significant for newly diagnosed multiple myeloma who presents for a follow up visit. The patient's last visit was on 02/26/2024. In the interim since the last visit she has had no major changes in her health.  On exam today Brooke Whitaker reports she has been well overall in the interim since our last visit.  She reports has been stressful at home as her mother-in-law recently passed away.  She was 36 years old.  She reports that she took her steroid pills this morning and took a lower dose at 20 mg.  She reports that she would like something stronger for her headaches.  She reports that she is not having any nausea, vomiting, or diarrhea but is taking Senokot to help keep her bowels regular as she does more likely struggle with constipation.  She reports that she does occasionally feel a little "sick" but drinks ginger ale has not had any frank nausea or vomiting.  She reports that she is tolerating her current problems well with no major side effect such as numbness or tingling of the  fingers or toes.  Overall she feels well and is willing and able to proceed with treatment at this time.   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    Sigmoid diverticulosis    Wears glasses     SURGICAL HISTORY: Past Surgical History:  Procedure Laterality Date   BREAST BIOPSY Right 09/2011   benign    COLONOSCOPY  2010   CYSTOSCOPY/RETROGRADE/URETEROSCOPY/STONE EXTRACTION WITH BASKET Left 04/21/2014   Procedure: cystoscopy, left retrograde pylegram, LEFT URETEROSCOPY, laser lithotripsy with holmium laser, STONE EXTRACTION, uretheral calibration;  Surgeon: Willye Harvey, MD;  Location: [facility];  Service: Urology;  Laterality: Left;   DILATION AND EVACUATION  1995   EPISIOTOMY REPAIR, SEVERE TEAR  1989   IR IMAGING GUIDED PORT INSERTION  02/04/2024   LAPAROSCOPIC OVARIAN CYSTECTOMY  10/ 2000   NM MYOCAR PERF WALL MOTION  12/15/2009   Normal   US  ECHOCARDIOGRAPHY  12/15/2009   Normal study for age: mild MR,TR and trace PI    SOCIAL HISTORY: Social History   Socioeconomic History   Marital status: Married    Spouse name: Not on file   Number of children: 2   Years of education: Not on file   Highest education level: Not on file  Occupational History   Occupation:  Print production planner  Tobacco Use   Smoking status: Never    Passive exposure: Never   Smokeless tobacco: Never  Vaping Use   Vaping status: Never Used  Substance and Sexual Activity   Alcohol  use: Yes    Alcohol /week: 2.0 standard drinks of alcohol     Types: 2 Glasses of wine per week   Drug use: No   Sexual activity: Not Currently    Partners: Male  Other Topics Concern   Not on file  Social History Narrative   Not on file   Social Drivers of Health   Financial Resource Strain: Not on file  Food Insecurity: No  Food Insecurity (05/16/2023)   Hunger Vital Sign    Worried About Running Out of Food in the Last Year: Never true    Ran Out of Food in the Last Year: Never true  Transportation Needs: No Transportation Needs (05/16/2023)   PRAPARE - Administrator, Civil Service (Medical): No    Lack of Transportation (Non-Medical): No  Physical Activity: Not on file  Stress: Not on file  Social Connections: Not on file  Intimate Partner Violence: Not At Risk (05/16/2023)   Humiliation, Afraid, Rape, and Kick questionnaire    Fear of Current or Ex-Partner: No    Emotionally Abused: No    Physically Abused: No    Sexually Abused: No    FAMILY HISTORY: Family History  Problem Relation Age of Onset   Diabetes Mother    Congestive Heart Failure Mother    Stroke Mother    Osteoporosis Mother    Other Mother        gallbladder ruptured   Heart attack Father    Diabetes Sister    Heart disease Sister    Heart disease Sister        x2   Hypertension Sister    Thyroid  disease Sister    Hypertension Sister    Diabetes Brother    Dementia Brother    Colon cancer Brother        mets to liver   Heart disease Brother        pacemaker   Stroke Brother    Liver cancer Brother    Heart disease Brother    Stomach cancer Neg Hx    Pancreatic cancer Neg Hx    Esophageal cancer Neg Hx     ALLERGIES:  is allergic to penicillins and sulfa antibiotics.  MEDICATIONS:  Current Outpatient Medications  Medication Sig Dispense Refill   HYDROcodone -acetaminophen  (NORCO/VICODIN) 5-325 MG tablet Take 1 tablet by mouth every 6 (six) hours as needed for moderate pain (pain score 4-6). 10 tablet 0   lidocaine -prilocaine  (EMLA ) cream Apply 1 Application topically as needed. 30 g 1   acetaminophen  (TYLENOL ) 500 MG tablet Take 500 mg by mouth every 6 (six) hours as needed for mild pain or headache.      acyclovir  (ZOVIRAX ) 400 MG tablet Take 1 tablet (400 mg total) by mouth 2 (two) times daily. 180  tablet 1   allopurinol  (ZYLOPRIM ) 300 MG tablet Take 1 tablet (300 mg total) by mouth daily. (Patient not taking: Reported on 12/25/2023) 90 tablet 1   aspirin EC 81 MG tablet Take 81 mg by mouth daily. Swallow whole. (Patient not taking: Reported on 12/25/2023)     cetirizine (ZYRTEC) 5 MG tablet Take 5 mg by mouth daily as needed for allergies.     cholecalciferol (VITAMIN D ) 1000 UNITS tablet Take 1,000 Units by mouth every  morning.      Cyanocobalamin  (VITAMIN B 12 PO) Take 1,000 mcg by mouth daily.     dexamethasone  (DECADRON ) 4 MG tablet Take 10 tablets (40 mg total) by mouth once a week. Take once weekly on the day you receive the chemotherapy injection. 40 tablet 5   doxycycline (MONODOX) 100 MG capsule Take 100 mg by mouth 2 (two) times daily. For 7 days     ELDERBERRY PO Take 1 tablet by mouth daily.     Elderberry-Vitamin C-Zinc (ELDERBERRY IMMUNE HEALTH GUMMY PO) Take by mouth daily.     fluticasone (FLONASE) 50 MCG/ACT nasal spray Place into the nose as needed.     levocetirizine (XYZAL) 5 MG tablet Take 5 mg by mouth every evening.     multivitamin-lutein (OCUVITE-LUTEIN) CAPS capsule Take 1 capsule by mouth daily.     ondansetron  (ZOFRAN ) 8 MG tablet Take 1 tablet (8 mg total) by mouth every 8 (eight) hours as needed. 30 tablet 0   prochlorperazine  (COMPAZINE ) 10 MG tablet Take 1 tablet (10 mg total) by mouth every 6 (six) hours as needed for nausea or vomiting. 30 tablet 0   rosuvastatin (CRESTOR) 5 MG tablet Take 5 mg by mouth daily.     traMADol  (ULTRAM ) 50 MG tablet Take 1 tablet (50 mg total) by mouth every 6 (six) hours as needed. 30 tablet 0   No current facility-administered medications for this visit.    REVIEW OF SYSTEMS:   Constitutional: ( - ) fevers, ( - )  chills , ( - ) night sweats Eyes: ( - ) blurriness of vision, ( - ) double vision, ( - ) watery eyes Ears, nose, mouth, throat, and face: ( - ) mucositis, ( - ) sore throat Respiratory: ( - ) cough, ( - )  dyspnea, ( - ) wheezes Cardiovascular: ( - ) palpitation, ( - ) chest discomfort, ( - ) lower extremity swelling Gastrointestinal:  ( - ) nausea, ( - ) heartburn, ( - ) change in bowel habits Skin: ( - ) abnormal skin rashes Lymphatics: ( - ) new lymphadenopathy, ( - ) easy bruising Neurological: ( - ) numbness, ( - ) tingling, ( - ) new weaknesses Behavioral/Psych: ( - ) mood change, ( - ) new changes  All other systems were reviewed with the patient and are negative.  PHYSICAL EXAMINATION: ECOG PERFORMANCE STATUS: 0 - Asymptomatic  There were no vitals filed for this visit.  There were no vitals filed for this visit.   GENERAL: Well-appearing elderly Caucasian female, alert, no distress and comfortable SKIN: skin color, texture, turgor are normal, no rashes or significant lesions EYES: conjunctiva are pink and non-injected, sclera clear LUNGS: clear to auscultation and percussion with normal breathing effort HEART: regular rate & rhythm and no murmurs and no lower extremity edema Musculoskeletal: no cyanosis of digits and no clubbing  PSYCH: alert & oriented x 3, fluent speech NEURO: no focal motor/sensory deficits  LABORATORY DATA:  I have reviewed the data as listed    Latest Ref Rng & Units 03/24/2024    1:50 PM 03/17/2024    1:53 PM 03/11/2024   10:44 AM  CBC  WBC 4.0 - 10.5 K/uL 12.6  7.3  6.1   Hemoglobin 12.0 - 15.0 g/dL 16.1  09.6  04.5   Hematocrit 36.0 - 46.0 % 39.3  39.6  38.9   Platelets 150 - 400 K/uL 164  118  185        Latest  Ref Rng & Units 03/24/2024    1:50 PM 03/17/2024    1:53 PM 03/11/2024   10:44 AM  CMP  Glucose 70 - 99 mg/dL 865  784  696   BUN 8 - 23 mg/dL 22  12  15    Creatinine 0.44 - 1.00 mg/dL 2.95  2.84  1.32   Sodium 135 - 145 mmol/L 141  139  144   Potassium 3.5 - 5.1 mmol/L 4.0  3.8  3.8   Chloride 98 - 111 mmol/L 107  107  110   CO2 22 - 32 mmol/L 25  25  29    Calcium 8.9 - 10.3 mg/dL 9.0  9.1  8.9   Total Protein 6.5 - 8.1 g/dL 6.0   6.3  5.7   Total Bilirubin 0.0 - 1.2 mg/dL 1.6  2.6  1.8   Alkaline Phos 38 - 126 U/L 87  83  79   AST 15 - 41 U/L 18  17  16    ALT 0 - 44 U/L 18  18  15      Lab Results  Component Value Date   MPROTEIN 0.1 (H) 02/26/2024   MPROTEIN 0.1 (H) 01/21/2024   MPROTEIN 0.2 (H) 11/26/2023   Lab Results  Component Value Date   KPAFRELGTCHN 1.2 (L) 02/26/2024   KPAFRELGTCHN 1.2 (L) 01/21/2024   KPAFRELGTCHN 3.2 (L) 11/26/2023   LAMBDASER 44.7 (H) 02/26/2024   LAMBDASER 41.5 (H) 01/21/2024   LAMBDASER 79.0 (H) 11/26/2023   KAPLAMBRATIO 0.03 (L) 02/26/2024   KAPLAMBRATIO 0.03 (L) 01/21/2024   KAPLAMBRATIO 0.04 (L) 11/26/2023    RADIOGRAPHIC STUDIES: No results found.   ASSESSMENT & PLAN Brooke Whitaker 73 y.o. female with medical history significant for newly diagnosed multiple myeloma who presents for a follow up visit.   Previously we discussed the diagnosis of multiple myeloma.  We discussed that this is a blood cancer that is not curable.  The goal is to get the patient into a durable remission with treatment.  We discussed the need for a minimum of 6 months of chemotherapy treatment then followed by maintenance therapy if the M protein has reached 0 and the free light chains are within normal limits.  We discussed expected side effects of the regimen and the plan moving forward.  The patient voiced her understanding of our findings, expected side effects, and risks/benefits.  She is okay with proceeding with chemotherapy at this time.  # Free Lambda Multiple Myeloma -- Diagnosis confirmed by the presence of a macrocytic anemia and 80% plasma cells in the bone marrow --Initially started with VRD chemotherapy with the patient developed rash as result of the Revlimid  and ocular toxicity from the Velcade .  Dropped the Revlimid  down to 10 mg p.o. daily and transition to Darzalex  for her next treatment. --UPEP showed marked Bence Jones proteins in the urine, no evidence of lytic lesions on  metastatic survey PLAN: -- Continue Kyprolis /Dex therapy.  Today is Cycle 4 Day 15  --Labs today show white blood cell 12.6, hemoglobin 13.2, MCV 99, platelets 164 --On 02/26/2024 patient had kappa 1.2, lambda 44.7, ratio 0.03 with an M protein of 0.1 -- Plan for return to clinic in 2 weeks with interval continued Kyprolis  therapy.  #Supportive Care -- chemotherapy education complete -- port placed -- zofran  8mg  q8H PRN and compazine  10mg  PO q6H for nausea -- acyclovir  400mg  PO BID for VCZ prophylaxis -- EMLA  cream for port -- Zometa to be added after dental clearance.  --  no pain medication required at this time.    No orders of the defined types were placed in this encounter.   All questions were answered. The patient knows to call the clinic with any problems, questions or concerns.  A total of more than 30 minutes were spent on this encounter with face-to-face time and non-face-to-face time, including preparing to see the patient, ordering tests and/or medications, counseling the patient and coordination of care as outlined above.   Rogerio Clay, MD Department of Hematology/Oncology [facility] at [facility] Phone: [phone] Pager: [phone] Email: [email or web address]  03/25/2024 10:08 AM

## 2024-03-24 NOTE — Patient Instructions (Signed)
 [facility] - A DEPT OF [facility]  Discharge Instructions: Thank you for choosing [facility] to provide your oncology and hematology care.   If you have a lab appointment with the Cancer Center, please go directly to the Cancer Center and check in at the registration area.   Wear comfortable clothing and clothing appropriate for easy access to any Portacath or PICC line.   We strive to give you quality time with your provider. You may need to reschedule your appointment if you arrive late (15 or more minutes).  Arriving late affects you and other patients whose appointments are after yours.  Also, if you miss three or more appointments without notifying the office, you may be dismissed from the clinic at the provider's discretion.      For prescription refill requests, have your pharmacy contact our office and allow 72 hours for refills to be completed.    Today you received the following chemotherapy and/or immunotherapy agent: Carfilzomib (Kyprolis)      To help prevent nausea and vomiting after your treatment, we encourage you to take your nausea medication as directed.  BELOW ARE SYMPTOMS THAT SHOULD BE REPORTED IMMEDIATELY: *FEVER GREATER THAN 100.4 F (38 C) OR HIGHER *CHILLS OR SWEATING *NAUSEA AND VOMITING THAT IS NOT CONTROLLED WITH YOUR NAUSEA MEDICATION *UNUSUAL SHORTNESS OF BREATH *UNUSUAL BRUISING OR BLEEDING *URINARY PROBLEMS (pain or burning when urinating, or frequent urination) *BOWEL PROBLEMS (unusual diarrhea, constipation, pain near the anus) TENDERNESS IN MOUTH AND THROAT WITH OR WITHOUT PRESENCE OF ULCERS (sore throat, sores in mouth, or a toothache) UNUSUAL RASH, SWELLING OR PAIN  UNUSUAL VAGINAL DISCHARGE OR ITCHING   Items with * indicate a potential emergency and should be followed up as soon as possible or go to the Emergency Department if any problems should occur.  Please show the CHEMOTHERAPY ALERT CARD or  IMMUNOTHERAPY ALERT CARD at check-in to the Emergency Department and triage nurse.  Should you have questions after your visit or need to cancel or reschedule your appointment, please contact [facility] - [facility]  Dept: [phone]  and follow the prompts.  Office hours are 8:00 a.m. to 4:30 p.m. Monday - Friday. Please note that voicemails left after 4:00 p.m. may not be returned until the following business day.  We are closed weekends and major holidays. You have access to a nurse at all times for urgent questions. Please call the main number to the clinic Dept: [phone] and follow the prompts.   For any non-urgent questions, you may also contact your provider using MyChart. We now offer e-Visits for anyone 81 and older to request care online for non-urgent symptoms. For details visit [email or web address].   Also download the MyChart app! Go to the app store, search "MyChart", open the app, select [place], and log in with your MyChart username and password.

## 2024-03-24 NOTE — Progress Notes (Signed)
 Pt. states she took Decadron  20 mg po at home this AM. States dose was reduced by Dr. Rosaline Coma and dose verified by Dr. Rosaline Coma. Per. Dr. Rosaline Coma, ok for treatment today with Total Bilirubin 1.6 mg/dL

## 2024-03-25 ENCOUNTER — Encounter: Payer: Self-pay | Admitting: Hematology and Oncology

## 2024-03-27 DIAGNOSIS — M79644 Pain in right finger(s): Secondary | ICD-10-CM | POA: Diagnosis not present

## 2024-03-27 DIAGNOSIS — I10 Essential (primary) hypertension: Secondary | ICD-10-CM | POA: Diagnosis not present

## 2024-03-27 DIAGNOSIS — L03011 Cellulitis of right finger: Secondary | ICD-10-CM | POA: Diagnosis not present

## 2024-03-27 DIAGNOSIS — I700 Atherosclerosis of aorta: Secondary | ICD-10-CM | POA: Diagnosis not present

## 2024-03-27 DIAGNOSIS — C9000 Multiple myeloma not having achieved remission: Secondary | ICD-10-CM | POA: Diagnosis not present

## 2024-03-31 ENCOUNTER — Encounter: Payer: Self-pay | Admitting: Hematology and Oncology

## 2024-04-07 ENCOUNTER — Inpatient Hospital Stay

## 2024-04-07 ENCOUNTER — Inpatient Hospital Stay (HOSPITAL_BASED_OUTPATIENT_CLINIC_OR_DEPARTMENT_OTHER): Admitting: Hematology and Oncology

## 2024-04-07 VITALS — BP 140/70 | HR 88 | Temp 98.4°F | Resp 16 | Wt 141.4 lb

## 2024-04-07 DIAGNOSIS — Z5112 Encounter for antineoplastic immunotherapy: Secondary | ICD-10-CM | POA: Diagnosis not present

## 2024-04-07 DIAGNOSIS — Z95828 Presence of other vascular implants and grafts: Secondary | ICD-10-CM

## 2024-04-07 DIAGNOSIS — C9000 Multiple myeloma not having achieved remission: Secondary | ICD-10-CM | POA: Diagnosis not present

## 2024-04-07 LAB — CMP (CANCER CENTER ONLY)
ALT: 15 U/L (ref 0–44)
AST: 17 U/L (ref 15–41)
Albumin: 4.4 g/dL (ref 3.5–5.0)
Alkaline Phosphatase: 89 U/L (ref 38–126)
Anion gap: 8 (ref 5–15)
BUN: 17 mg/dL (ref 8–23)
CO2: 25 mmol/L (ref 22–32)
Calcium: 9.4 mg/dL (ref 8.9–10.3)
Chloride: 107 mmol/L (ref 98–111)
Creatinine: 0.61 mg/dL (ref 0.44–1.00)
GFR, Estimated: >60 mL/min (ref >60)
Glucose, Bld: 253 mg/dL — ABNORMAL HIGH (ref 70–99)
Potassium: 4 mmol/L (ref 3.5–5.1)
Sodium: 140 mmol/L (ref 135–145)
Total Bilirubin: 1.2 mg/dL (ref 0.0–1.2)
Total Protein: 6.2 g/dL — ABNORMAL LOW (ref 6.5–8.1)

## 2024-04-07 LAB — CBC WITH DIFFERENTIAL (CANCER CENTER ONLY)
Abs Immature Granulocytes: 0.02 10*3/uL (ref 0.00–0.07)
Basophils Absolute: 0 10*3/uL (ref 0.0–0.1)
Basophils Relative: 0 %
Eosinophils Absolute: 0 10*3/uL (ref 0.0–0.5)
Eosinophils Relative: 0 %
HCT: 41.1 % (ref 36.0–46.0)
Hemoglobin: 13.9 g/dL (ref 12.0–15.0)
Immature Granulocytes: 0 %
Lymphocytes Relative: 8 %
Lymphs Abs: 0.6 10*3/uL — ABNORMAL LOW (ref 0.7–4.0)
MCH: 33.3 pg (ref 26.0–34.0)
MCHC: 33.8 g/dL (ref 30.0–36.0)
MCV: 98.3 fL (ref 80.0–100.0)
Monocytes Absolute: 0 10*3/uL — ABNORMAL LOW (ref 0.1–1.0)
Monocytes Relative: 0 %
Neutro Abs: 6.6 10*3/uL (ref 1.7–7.7)
Neutrophils Relative %: 92 %
Platelet Count: 209 10*3/uL (ref 150–400)
RBC: 4.18 MIL/uL (ref 3.87–5.11)
RDW: 11.8 % (ref 11.5–15.5)
WBC Count: 7.2 10*3/uL (ref 4.0–10.5)
nRBC: 0 % (ref 0.0–0.2)

## 2024-04-07 MED ORDER — SODIUM CHLORIDE 0.9 % IV SOLN: Freq: Once | INTRAVENOUS | Status: AC

## 2024-04-07 MED ORDER — SODIUM CHLORIDE 0.9 % IV SOLN: INTRAVENOUS | Status: DC

## 2024-04-07 MED ORDER — DEXTROSE 5 % IV SOLN
70.0000 mg/m2 | Freq: Once | INTRAVENOUS | Status: AC
  Administered 2024-04-07: 120 mg via INTRAVENOUS
  Filled 2024-04-07: qty 60

## 2024-04-07 MED ORDER — SODIUM CHLORIDE 0.9% FLUSH
10.0000 mL | Freq: Once | INTRAVENOUS | Status: AC
  Administered 2024-04-07: 10 mL

## 2024-04-07 MED ORDER — HEPARIN SOD (PORK) LOCK FLUSH 100 UNIT/ML IV SOLN
500.0000 [IU] | Freq: Once | INTRAVENOUS | Status: DC | PRN
Start: 2024-04-07 — End: 2024-04-07

## 2024-04-07 MED ORDER — SODIUM CHLORIDE 0.9% FLUSH: 10.0000 mL | INTRAVENOUS | Status: DC | PRN

## 2024-04-07 NOTE — Patient Instructions (Signed)
 [facility] - A DEPT OF [facility]  Discharge Instructions: Thank you for choosing [facility] to provide your oncology and hematology care.   If you have a lab appointment with the Cancer Center, please go directly to the Cancer Center and check in at the registration area.   Wear comfortable clothing and clothing appropriate for easy access to any Portacath or PICC line.   We strive to give you quality time with your provider. You may need to reschedule your appointment if you arrive late (15 or more minutes).  Arriving late affects you and other patients whose appointments are after yours.  Also, if you miss three or more appointments without notifying the office, you may be dismissed from the clinic at the provider's discretion.      For prescription refill requests, have your pharmacy contact our office and allow 72 hours for refills to be completed.    Today you received the following chemotherapy and/or immunotherapy agents kyprolis      To help prevent nausea and vomiting after your treatment, we encourage you to take your nausea medication as directed.  BELOW ARE SYMPTOMS THAT SHOULD BE REPORTED IMMEDIATELY: *FEVER GREATER THAN 100.4 F (38 C) OR HIGHER *CHILLS OR SWEATING *NAUSEA AND VOMITING THAT IS NOT CONTROLLED WITH YOUR NAUSEA MEDICATION *UNUSUAL SHORTNESS OF BREATH *UNUSUAL BRUISING OR BLEEDING *URINARY PROBLEMS (pain or burning when urinating, or frequent urination) *BOWEL PROBLEMS (unusual diarrhea, constipation, pain near the anus) TENDERNESS IN MOUTH AND THROAT WITH OR WITHOUT PRESENCE OF ULCERS (sore throat, sores in mouth, or a toothache) UNUSUAL RASH, SWELLING OR PAIN  UNUSUAL VAGINAL DISCHARGE OR ITCHING   Items with * indicate a potential emergency and should be followed up as soon as possible or go to the Emergency Department if any problems should occur.  Please show the CHEMOTHERAPY ALERT CARD or IMMUNOTHERAPY  ALERT CARD at check-in to the Emergency Department and triage nurse.  Should you have questions after your visit or need to cancel or reschedule your appointment, please contact [facility] - A DEPT OF [facility]  Dept: [phone]  and follow the prompts.  Office hours are 8:00 a.m. to 4:30 p.m. Monday - Friday. Please note that voicemails left after 4:00 p.m. may not be returned until the following business day.  We are closed weekends and major holidays. You have access to a nurse at all times for urgent questions. Please call the main number to the clinic Dept: [phone] and follow the prompts.   For any non-urgent questions, you may also contact your provider using MyChart. We now offer e-Visits for anyone 80 and older to request care online for non-urgent symptoms. For details visit [email or web address].   Also download the MyChart app! Go to the app store, search "MyChart", open the app, select [facility], and log in with your MyChart username and password.

## 2024-04-08 ENCOUNTER — Encounter: Payer: Self-pay | Admitting: Hematology and Oncology

## 2024-04-08 LAB — KAPPA/LAMBDA LIGHT CHAINS
Kappa free light chain: 1.3 mg/L — ABNORMAL LOW (ref 3.3–19.4)
Kappa, lambda light chain ratio: 0.03 — ABNORMAL LOW (ref 0.26–1.65)
Lambda free light chains: 50.2 mg/L — ABNORMAL HIGH (ref 5.7–26.3)

## 2024-04-08 NOTE — Progress Notes (Signed)
 [facility] [phone]   [phone]  PROGRESS NOTE  Patient Care Team: Shayne Anes, MD as PCP - General (Internal Medicine)  Hematological/Oncological History # Free Lambda Multiple Myeloma 11/10/2022: WBC 5.68, Hgb 11.7, MCV 97.4, Plt 146 04/17/2023: WBC 4.53, Hgb 11.5, MCV 107.6, Plt 110 05/16/2023: establish care with Dr. Federico. Labs showed M protein 0.1 with Lambda 900, Kappa 3 with ratio 0.00.   06/01/2023: bone marrow biopsy performed, showed plasma cell myeloma nearly effacing approximately 80% of the cellular marrow.  06/25/2023: Cycle 1 Day 1 of VRD chemotherapy 07/23/2023: Cycle 2 Day 1 of VRD chemotherapy 08/13/2023: Cycle 3 Day 1 of VRD chemotherapy 09/03/2023: Cycle 1 Day 1 of Dara/Rev/Dex. 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 12/17/2023: Cycle 1 Day 1 of Kyprolis . Dara/Rev ineffective.   Interval History:  Brooke Whitaker 73 y.o. female with medical history significant for newly diagnosed multiple myeloma who presents for a follow up visit. The patient's last visit was on 03/24/2024. In the interim since the last visit she has had no major changes in her health.  On exam today Brooke Whitaker reports she has been doing pretty well in the interim since her last visit.  She dropped her previous dose of Dex down to 20 mg p.o.  She reports she is having some occasional bouts of shortness of breath.  She is doing her best to try to stay hydrated and drinking plenty of water .  She notes that she has a good appetite and her weight has been steady, though she is trying to avoid sugar.  She has had some trouble opening a small object such bottle caps and needs to ask for assistance.  She notes that she thinks this may be due to arthritic fingers rather than neuropathy.  Otherwise she is having no trouble with nausea, vomiting, or diarrhea.  She denies any fevers, chills, sweats.  A full 10 point ROS is otherwise negative.   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    Sigmoid diverticulosis    Wears glasses     SURGICAL HISTORY: Past Surgical History:  Procedure Laterality Date   BREAST BIOPSY Right 09/2011   benign    COLONOSCOPY  2010   CYSTOSCOPY/RETROGRADE/URETEROSCOPY/STONE EXTRACTION WITH BASKET Left 04/21/2014   Procedure: cystoscopy, left retrograde pylegram, LEFT URETEROSCOPY, laser lithotripsy with holmium laser, STONE EXTRACTION, uretheral calibration;  Surgeon: Norleen JINNY Seltzer, MD;  Location: [facility];  Service: Urology;  Laterality: Left;   DILATION AND EVACUATION  1995   EPISIOTOMY REPAIR, SEVERE TEAR  1989   IR IMAGING GUIDED PORT INSERTION  02/04/2024   LAPAROSCOPIC OVARIAN CYSTECTOMY  10/ 2000   NM MYOCAR PERF WALL MOTION  12/15/2009   Normal   US  ECHOCARDIOGRAPHY  12/15/2009   Normal study for age: mild MR,TR and trace PI    SOCIAL HISTORY: Social History   Socioeconomic History   Marital status: Married    Spouse name: Not on file   Number of children: 2   Years of education: Not on file   Highest education level: Not on file  Occupational History   Occupation: Print production planner  Tobacco Use   Smoking status: Never    Passive exposure: Never   Smokeless tobacco:  Never  Vaping Use   Vaping status: Never Used  Substance and Sexual Activity   Alcohol  use: Yes    Alcohol /week: 2.0 standard drinks of alcohol     Types: 2 Glasses of wine per week   Drug use: No   Sexual activity: Not Currently    Partners: Male  Other Topics Concern   Not on file  Social History Narrative   Not on file   Social Drivers of Health   Financial Resource Strain: Not on file  Food Insecurity: No Food Insecurity (05/16/2023)   Hunger Vital Sign    Worried About Running Out of Food in the Last Year: Never true    Ran Out of  Food in the Last Year: Never true  Transportation Needs: No Transportation Needs (05/16/2023)   PRAPARE - Administrator, Civil Service (Medical): No    Lack of Transportation (Non-Medical): No  Physical Activity: Not on file  Stress: Not on file  Social Connections: Not on file  Intimate Partner Violence: Not At Risk (05/16/2023)   Humiliation, Afraid, Rape, and Kick questionnaire    Fear of Current or Ex-Partner: No    Emotionally Abused: No    Physically Abused: No    Sexually Abused: No    FAMILY HISTORY: Family History  Problem Relation Age of Onset   Diabetes Mother    Congestive Heart Failure Mother    Stroke Mother    Osteoporosis Mother    Other Mother        gallbladder ruptured   Heart attack Father    Diabetes Sister    Heart disease Sister    Heart disease Sister        x2   Hypertension Sister    Thyroid  disease Sister    Hypertension Sister    Diabetes Brother    Dementia Brother    Colon cancer Brother        mets to liver   Heart disease Brother        pacemaker   Stroke Brother    Liver cancer Brother    Heart disease Brother    Stomach cancer Neg Hx    Pancreatic cancer Neg Hx    Esophageal cancer Neg Hx     ALLERGIES:  is allergic to penicillins and sulfa antibiotics.  MEDICATIONS:  Current Outpatient Medications  Medication Sig Dispense Refill   lidocaine -prilocaine  (EMLA ) cream Apply 1 Application topically as needed. 30 g 1   telmisartan (MICARDIS) 20 MG tablet Take 20 mg by mouth daily.     valACYclovir  (VALTREX ) 500 MG tablet Take 500 mg by mouth daily.     acetaminophen  (TYLENOL ) 500 MG tablet Take 500 mg by mouth every 6 (six) hours as needed for mild pain or headache.      cetirizine (ZYRTEC) 5 MG tablet Take 5 mg by mouth daily as needed for allergies.     cholecalciferol (VITAMIN D ) 1000 UNITS tablet Take 1,000 Units by mouth every morning.      Cyanocobalamin  (VITAMIN B 12 PO) Take 1,000 mcg by mouth daily.      dexamethasone  (DECADRON ) 4 MG tablet Take 10 tablets (40 mg total) by mouth once a week. Take once weekly on the day you receive the chemotherapy injection. 40 tablet 5   doxycycline (MONODOX) 100 MG capsule Take 100 mg by mouth 2 (two) times daily. For 7 days     ELDERBERRY PO Take 1 tablet by mouth daily.     Elderberry-Vitamin  C-Zinc (ELDERBERRY IMMUNE HEALTH GUMMY PO) Take by mouth daily.     fluticasone (FLONASE) 50 MCG/ACT nasal spray Place into the nose as needed.     HYDROcodone -acetaminophen  (NORCO/VICODIN) 5-325 MG tablet Take 1 tablet by mouth every 6 (six) hours as needed for moderate pain (pain score 4-6). 10 tablet 0   levocetirizine (XYZAL) 5 MG tablet Take 5 mg by mouth every evening.     multivitamin-lutein (OCUVITE-LUTEIN) CAPS capsule Take 1 capsule by mouth daily.     ondansetron  (ZOFRAN ) 8 MG tablet Take 1 tablet (8 mg total) by mouth every 8 (eight) hours as needed. 30 tablet 0   prochlorperazine  (COMPAZINE ) 10 MG tablet Take 1 tablet (10 mg total) by mouth every 6 (six) hours as needed for nausea or vomiting. 30 tablet 0   rosuvastatin (CRESTOR) 5 MG tablet Take 5 mg by mouth daily.     traMADol  (ULTRAM ) 50 MG tablet Take 1 tablet (50 mg total) by mouth every 6 (six) hours as needed. 30 tablet 0   No current facility-administered medications for this visit.    REVIEW OF SYSTEMS:   Constitutional: ( - ) fevers, ( - )  chills , ( - ) night sweats Eyes: ( - ) blurriness of vision, ( - ) double vision, ( - ) watery eyes Ears, nose, mouth, throat, and face: ( - ) mucositis, ( - ) sore throat Respiratory: ( - ) cough, ( - ) dyspnea, ( - ) wheezes Cardiovascular: ( - ) palpitation, ( - ) chest discomfort, ( - ) lower extremity swelling Gastrointestinal:  ( - ) nausea, ( - ) heartburn, ( - ) change in bowel habits Skin: ( - ) abnormal skin rashes Lymphatics: ( - ) new lymphadenopathy, ( - ) easy bruising Neurological: ( - ) numbness, ( - ) tingling, ( - ) new  weaknesses Behavioral/Psych: ( - ) mood change, ( - ) new changes  All other systems were reviewed with the patient and are negative.  PHYSICAL EXAMINATION: ECOG PERFORMANCE STATUS: 0 - Asymptomatic  Vitals:   04/07/24 1442  BP: (!) 140/70  Pulse: 88  Resp: 16  Temp: 98.4 F (36.9 C)  SpO2: 98%    Filed Weights   04/07/24 1442  Weight: 141 lb 6.4 oz (64.1 kg)     GENERAL: Well-appearing elderly Caucasian female, alert, no distress and comfortable SKIN: skin color, texture, turgor are normal, no rashes or significant lesions EYES: conjunctiva are pink and non-injected, sclera clear LUNGS: clear to auscultation and percussion with normal breathing effort HEART: regular rate & rhythm and no murmurs and no lower extremity edema Musculoskeletal: no cyanosis of digits and no clubbing  PSYCH: alert & oriented x 3, fluent speech NEURO: no focal motor/sensory deficits  LABORATORY DATA:  I have reviewed the data as listed    Latest Ref Rng & Units 04/07/2024    2:19 PM 03/24/2024    1:50 PM 03/17/2024    1:53 PM  CBC  WBC 4.0 - 10.5 K/uL 7.2  12.6  7.3   Hemoglobin 12.0 - 15.0 g/dL 86.0  86.7  86.6   Hematocrit 36.0 - 46.0 % 41.1  39.3  39.6   Platelets 150 - 400 K/uL 209  164  118        Latest Ref Rng & Units 04/07/2024    2:19 PM 03/24/2024    1:50 PM 03/17/2024    1:53 PM  CMP  Glucose 70 - 99 mg/dL 746  837  124   BUN 8 - 23 mg/dL 17  22  12    Creatinine 0.44 - 1.00 mg/dL 9.38  9.37  9.42   Sodium 135 - 145 mmol/L 140  141  139   Potassium 3.5 - 5.1 mmol/L 4.0  4.0  3.8   Chloride 98 - 111 mmol/L 107  107  107   CO2 22 - 32 mmol/L 25  25  25    Calcium 8.9 - 10.3 mg/dL 9.4  9.0  9.1   Total Protein 6.5 - 8.1 g/dL 6.2  6.0  6.3   Total Bilirubin 0.0 - 1.2 mg/dL 1.2  1.6  2.6   Alkaline Phos 38 - 126 U/L 89  87  83   AST 15 - 41 U/L 17  18  17    ALT 0 - 44 U/L 15  18  18      Lab Results  Component Value Date   MPROTEIN 0.1 (H) 02/26/2024   MPROTEIN 0.1 (H)  01/21/2024   MPROTEIN 0.2 (H) 11/26/2023   Lab Results  Component Value Date   KPAFRELGTCHN 1.2 (L) 02/26/2024   KPAFRELGTCHN 1.2 (L) 01/21/2024   KPAFRELGTCHN 3.2 (L) 11/26/2023   LAMBDASER 44.7 (H) 02/26/2024   LAMBDASER 41.5 (H) 01/21/2024   LAMBDASER 79.0 (H) 11/26/2023   KAPLAMBRATIO 0.03 (L) 02/26/2024   KAPLAMBRATIO 0.03 (L) 01/21/2024   KAPLAMBRATIO 0.04 (L) 11/26/2023    RADIOGRAPHIC STUDIES: No results found.   ASSESSMENT & PLAN Brooke Whitaker 73 y.o. female with medical history significant for newly diagnosed multiple myeloma who presents for a follow up visit.   Previously we discussed the diagnosis of multiple myeloma.  We discussed that this is a blood cancer that is not curable.  The goal is to get the patient into a durable remission with treatment.  We discussed the need for a minimum of 6 months of chemotherapy treatment then followed by maintenance therapy if the M protein has reached 0 and the free light chains are within normal limits.  We discussed expected side effects of the regimen and the plan moving forward.  The patient voiced her understanding of our findings, expected side effects, and risks/benefits.  She is okay with proceeding with chemotherapy at this time.  # Free Lambda Multiple Myeloma -- Diagnosis confirmed by the presence of a macrocytic anemia and 80% plasma cells in the bone marrow --Initially started with VRD chemotherapy with the patient developed rash as result of the Revlimid  and ocular toxicity from the Velcade .  Dropped the Revlimid  down to 10 mg p.o. daily and transition to Darzalex  for her next treatment. --UPEP showed marked Bence Jones proteins in the urine, no evidence of lytic lesions on metastatic survey PLAN: -- Continue Kyprolis /Dex therapy.  Today is Cycle 5 Day 1 --Labs today show white blood cell 7.2, hemoglobin 13.9, MCV 98.3, platelets 209  --On 02/26/2024 patient had kappa 1.2, lambda 44.7, ratio 0.03 with an M protein of  0.1 -- Plan for return to clinic in 2 weeks with interval continued Kyprolis  therapy.  #Supportive Care -- chemotherapy education complete -- port placed -- zofran  8mg  q8H PRN and compazine  10mg  PO q6H for nausea -- acyclovir  400mg  PO BID for VCZ prophylaxis -- EMLA  cream for port -- Zometa to be added after dental clearance.  -- no pain medication required at this time.    No orders of the defined types were placed in this encounter.   All questions were answered. The patient knows to call the  clinic with any problems, questions or concerns.  A total of more than 30 minutes were spent on this encounter with face-to-face time and non-face-to-face time, including preparing to see the patient, ordering tests and/or medications, counseling the patient and coordination of care as outlined above.   Norleen IVAR Kidney, MD Department of Hematology/Oncology [facility] at [facility] Phone: [phone] Pager: [phone] Email: [email or web address]  04/08/2024 11:45 AM

## 2024-04-09 LAB — MULTIPLE MYELOMA PANEL, SERUM
Albumin SerPl Elph-Mcnc: 3.8 g/dL (ref 2.9–4.4)
Albumin/Glob SerPl: 2.1 — ABNORMAL HIGH (ref 0.7–1.7)
Alpha 1: 0.2 g/dL (ref 0.0–0.4)
Alpha2 Glob SerPl Elph-Mcnc: 0.8 g/dL (ref 0.4–1.0)
B-Globulin SerPl Elph-Mcnc: 0.7 g/dL (ref 0.7–1.3)
Gamma Glob SerPl Elph-Mcnc: 0.2 g/dL — ABNORMAL LOW (ref 0.4–1.8)
Globulin, Total: 1.9 g/dL — ABNORMAL LOW (ref 2.2–3.9)
IgA: <5 mg/dL — ABNORMAL LOW (ref 64–422)
IgG (Immunoglobin G), Serum: 196 mg/dL — ABNORMAL LOW (ref 586–1602)
IgM (Immunoglobulin M), Srm: <5 mg/dL — ABNORMAL LOW (ref 26–217)
Total Protein ELP: 5.7 g/dL — ABNORMAL LOW (ref 6.0–8.5)

## 2024-04-14 ENCOUNTER — Encounter: Payer: Self-pay | Admitting: Hematology and Oncology

## 2024-04-14 ENCOUNTER — Inpatient Hospital Stay

## 2024-04-14 VITALS — BP 151/69 | HR 87 | Temp 97.4°F | Resp 16 | Ht 61.0 in | Wt 141.8 lb

## 2024-04-14 DIAGNOSIS — C9000 Multiple myeloma not having achieved remission: Secondary | ICD-10-CM

## 2024-04-14 DIAGNOSIS — Z5112 Encounter for antineoplastic immunotherapy: Secondary | ICD-10-CM | POA: Diagnosis not present

## 2024-04-14 DIAGNOSIS — Z95828 Presence of other vascular implants and grafts: Secondary | ICD-10-CM

## 2024-04-14 LAB — CMP (CANCER CENTER ONLY)
ALT: 16 U/L (ref 0–44)
AST: 15 U/L (ref 15–41)
Albumin: 4.3 g/dL (ref 3.5–5.0)
Alkaline Phosphatase: 82 U/L (ref 38–126)
Anion gap: 7 (ref 5–15)
BUN: 22 mg/dL (ref 8–23)
CO2: 26 mmol/L (ref 22–32)
Calcium: 9.1 mg/dL (ref 8.9–10.3)
Chloride: 108 mmol/L (ref 98–111)
Creatinine: 0.56 mg/dL (ref 0.44–1.00)
GFR, Estimated: >60 mL/min (ref >60)
Glucose, Bld: 151 mg/dL — ABNORMAL HIGH (ref 70–99)
Potassium: 4.1 mmol/L (ref 3.5–5.1)
Sodium: 141 mmol/L (ref 135–145)
Total Bilirubin: 1.2 mg/dL (ref 0.0–1.2)
Total Protein: 6.1 g/dL — ABNORMAL LOW (ref 6.5–8.1)

## 2024-04-14 LAB — CBC WITH DIFFERENTIAL (CANCER CENTER ONLY)
Abs Immature Granulocytes: 0.02 10*3/uL (ref 0.00–0.07)
Basophils Absolute: 0 10*3/uL (ref 0.0–0.1)
Basophils Relative: 0 %
Eosinophils Absolute: 0 10*3/uL (ref 0.0–0.5)
Eosinophils Relative: 0 %
HCT: 41 % (ref 36.0–46.0)
Hemoglobin: 13.9 g/dL (ref 12.0–15.0)
Immature Granulocytes: 0 %
Lymphocytes Relative: 7 %
Lymphs Abs: 0.6 10*3/uL — ABNORMAL LOW (ref 0.7–4.0)
MCH: 32.9 pg (ref 26.0–34.0)
MCHC: 33.9 g/dL (ref 30.0–36.0)
MCV: 96.9 fL (ref 80.0–100.0)
Monocytes Absolute: 0 10*3/uL — ABNORMAL LOW (ref 0.1–1.0)
Monocytes Relative: 1 %
Neutro Abs: 8.1 10*3/uL — ABNORMAL HIGH (ref 1.7–7.7)
Neutrophils Relative %: 92 %
Platelet Count: 134 10*3/uL — ABNORMAL LOW (ref 150–400)
RBC: 4.23 MIL/uL (ref 3.87–5.11)
RDW: 11.6 % (ref 11.5–15.5)
WBC Count: 8.8 10*3/uL (ref 4.0–10.5)
nRBC: 0 % (ref 0.0–0.2)

## 2024-04-14 MED ORDER — SODIUM CHLORIDE 0.9% FLUSH
10.0000 mL | Freq: Once | INTRAVENOUS | Status: AC
  Administered 2024-04-14: 10 mL

## 2024-04-14 MED ORDER — HEPARIN SOD (PORK) LOCK FLUSH 100 UNIT/ML IV SOLN
500.0000 [IU] | Freq: Once | INTRAVENOUS | Status: AC | PRN
  Administered 2024-04-14: 500 [IU]

## 2024-04-14 MED ORDER — DEXTROSE 5 % IV SOLN
70.0000 mg/m2 | Freq: Once | INTRAVENOUS | Status: AC
  Administered 2024-04-14: 120 mg via INTRAVENOUS
  Filled 2024-04-14: qty 60

## 2024-04-14 MED ORDER — SODIUM CHLORIDE 0.9% FLUSH
10.0000 mL | INTRAVENOUS | Status: DC | PRN
  Administered 2024-04-14: 10 mL

## 2024-04-14 MED ORDER — SODIUM CHLORIDE 0.9 % IV SOLN: Freq: Once | INTRAVENOUS | Status: AC

## 2024-04-14 MED ORDER — SODIUM CHLORIDE 0.9 % IV SOLN: INTRAVENOUS | Status: DC

## 2024-04-14 NOTE — Progress Notes (Signed)
 Pt. states she took her Decadron  at home earlier today.

## 2024-04-14 NOTE — Patient Instructions (Signed)
 [facility] - A DEPT OF [facility]  Discharge Instructions: Thank you for choosing [facility] to provide your oncology and hematology care.   If you have a lab appointment with the Cancer Center, please go directly to the Cancer Center and check in at the registration area.   Wear comfortable clothing and clothing appropriate for easy access to any Portacath or PICC line.   We strive to give you quality time with your provider. You may need to reschedule your appointment if you arrive late (15 or more minutes).  Arriving late affects you and other patients whose appointments are after yours.  Also, if you miss three or more appointments without notifying the office, you may be dismissed from the clinic at the provider's discretion.      For prescription refill requests, have your pharmacy contact our office and allow 72 hours for refills to be completed.    Today you received the following chemotherapy and/or immunotherapy agent: Carfilzomib (Kyprolis)      To help prevent nausea and vomiting after your treatment, we encourage you to take your nausea medication as directed.  BELOW ARE SYMPTOMS THAT SHOULD BE REPORTED IMMEDIATELY: *FEVER GREATER THAN 100.4 F (38 C) OR HIGHER *CHILLS OR SWEATING *NAUSEA AND VOMITING THAT IS NOT CONTROLLED WITH YOUR NAUSEA MEDICATION *UNUSUAL SHORTNESS OF BREATH *UNUSUAL BRUISING OR BLEEDING *URINARY PROBLEMS (pain or burning when urinating, or frequent urination) *BOWEL PROBLEMS (unusual diarrhea, constipation, pain near the anus) TENDERNESS IN MOUTH AND THROAT WITH OR WITHOUT PRESENCE OF ULCERS (sore throat, sores in mouth, or a toothache) UNUSUAL RASH, SWELLING OR PAIN  UNUSUAL VAGINAL DISCHARGE OR ITCHING   Items with * indicate a potential emergency and should be followed up as soon as possible or go to the Emergency Department if any problems should occur.  Please show the CHEMOTHERAPY ALERT CARD or  IMMUNOTHERAPY ALERT CARD at check-in to the Emergency Department and triage nurse.  Should you have questions after your visit or need to cancel or reschedule your appointment, please contact [facility] - [facility]  Dept: [phone]  and follow the prompts.  Office hours are 8:00 a.m. to 4:30 p.m. Monday - Friday. Please note that voicemails left after 4:00 p.m. may not be returned until the following business day.  We are closed weekends and major holidays. You have access to a nurse at all times for urgent questions. Please call the main number to the clinic Dept: [phone] and follow the prompts.   For any non-urgent questions, you may also contact your provider using MyChart. We now offer e-Visits for anyone 81 and older to request care online for non-urgent symptoms. For details visit [email or web address].   Also download the MyChart app! Go to the app store, search "MyChart", open the app, select [place], and log in with your MyChart username and password.

## 2024-04-21 ENCOUNTER — Inpatient Hospital Stay: Admitting: Hematology and Oncology

## 2024-04-21 ENCOUNTER — Inpatient Hospital Stay

## 2024-04-21 ENCOUNTER — Inpatient Hospital Stay: Attending: Hematology and Oncology

## 2024-04-21 VITALS — BP 143/76 | HR 81 | Temp 98.5°F | Resp 15 | Wt 141.9 lb

## 2024-04-21 DIAGNOSIS — C9000 Multiple myeloma not having achieved remission: Secondary | ICD-10-CM | POA: Insufficient documentation

## 2024-04-21 DIAGNOSIS — R519 Headache, unspecified: Secondary | ICD-10-CM | POA: Diagnosis not present

## 2024-04-21 DIAGNOSIS — Z95828 Presence of other vascular implants and grafts: Secondary | ICD-10-CM

## 2024-04-21 DIAGNOSIS — D696 Thrombocytopenia, unspecified: Secondary | ICD-10-CM | POA: Diagnosis not present

## 2024-04-21 DIAGNOSIS — Z79899 Other long term (current) drug therapy: Secondary | ICD-10-CM | POA: Insufficient documentation

## 2024-04-21 DIAGNOSIS — Z5112 Encounter for antineoplastic immunotherapy: Secondary | ICD-10-CM | POA: Insufficient documentation

## 2024-04-21 LAB — CBC WITH DIFFERENTIAL (CANCER CENTER ONLY)
Abs Immature Granulocytes: 0.04 K/uL (ref 0.00–0.07)
Basophils Absolute: 0 K/uL (ref 0.0–0.1)
Basophils Relative: 0 %
Eosinophils Absolute: 0 K/uL (ref 0.0–0.5)
Eosinophils Relative: 0 %
HCT: 41.2 % (ref 36.0–46.0)
Hemoglobin: 13.9 g/dL (ref 12.0–15.0)
Immature Granulocytes: 1 %
Lymphocytes Relative: 9 %
Lymphs Abs: 0.7 K/uL (ref 0.7–4.0)
MCH: 32.8 pg (ref 26.0–34.0)
MCHC: 33.7 g/dL (ref 30.0–36.0)
MCV: 97.2 fL (ref 80.0–100.0)
Monocytes Absolute: 0.1 K/uL (ref 0.1–1.0)
Monocytes Relative: 1 %
Neutro Abs: 6.8 K/uL (ref 1.7–7.7)
Neutrophils Relative %: 89 %
Platelet Count: 157 K/uL (ref 150–400)
RBC: 4.24 MIL/uL (ref 3.87–5.11)
RDW: 12.1 % (ref 11.5–15.5)
WBC Count: 7.6 K/uL (ref 4.0–10.5)
nRBC: 0 % (ref 0.0–0.2)

## 2024-04-21 LAB — CMP (CANCER CENTER ONLY)
ALT: 17 U/L (ref 0–44)
AST: 15 U/L (ref 15–41)
Albumin: 4.1 g/dL (ref 3.5–5.0)
Alkaline Phosphatase: 73 U/L (ref 38–126)
Anion gap: 5 (ref 5–15)
BUN: 18 mg/dL (ref 8–23)
CO2: 29 mmol/L (ref 22–32)
Calcium: 9 mg/dL (ref 8.9–10.3)
Chloride: 109 mmol/L (ref 98–111)
Creatinine: 0.58 mg/dL (ref 0.44–1.00)
GFR, Estimated: >60 mL/min (ref >60)
Glucose, Bld: 106 mg/dL — ABNORMAL HIGH (ref 70–99)
Potassium: 4.2 mmol/L (ref 3.5–5.1)
Sodium: 143 mmol/L (ref 135–145)
Total Bilirubin: 1.2 mg/dL (ref 0.0–1.2)
Total Protein: 5.8 g/dL — ABNORMAL LOW (ref 6.5–8.1)

## 2024-04-21 MED ORDER — SODIUM CHLORIDE 0.9% FLUSH
10.0000 mL | Freq: Once | INTRAVENOUS | Status: AC
  Administered 2024-04-21: 10 mL

## 2024-04-21 MED ORDER — SODIUM CHLORIDE 0.9 % IV SOLN: Freq: Once | INTRAVENOUS | Status: AC

## 2024-04-21 MED ORDER — DEXTROSE 5 % IV SOLN
70.0000 mg/m2 | Freq: Once | INTRAVENOUS | Status: AC
  Administered 2024-04-21: 120 mg via INTRAVENOUS
  Filled 2024-04-21: qty 60

## 2024-04-21 NOTE — Patient Instructions (Signed)
 [facility] - A DEPT OF [facility]  Discharge Instructions: Thank you for choosing [facility] to provide your oncology and hematology care.   If you have a lab appointment with the Cancer Center, please go directly to the Cancer Center and check in at the registration area.   Wear comfortable clothing and clothing appropriate for easy access to any Portacath or PICC line.   We strive to give you quality time with your provider. You may need to reschedule your appointment if you arrive late (15 or more minutes).  Arriving late affects you and other patients whose appointments are after yours.  Also, if you miss three or more appointments without notifying the office, you may be dismissed from the clinic at the provider's discretion.      For prescription refill requests, have your pharmacy contact our office and allow 72 hours for refills to be completed.    Today you received the following chemotherapy and/or immunotherapy agent: Carfilzomib (Kyprolis)      To help prevent nausea and vomiting after your treatment, we encourage you to take your nausea medication as directed.  BELOW ARE SYMPTOMS THAT SHOULD BE REPORTED IMMEDIATELY: *FEVER GREATER THAN 100.4 F (38 C) OR HIGHER *CHILLS OR SWEATING *NAUSEA AND VOMITING THAT IS NOT CONTROLLED WITH YOUR NAUSEA MEDICATION *UNUSUAL SHORTNESS OF BREATH *UNUSUAL BRUISING OR BLEEDING *URINARY PROBLEMS (pain or burning when urinating, or frequent urination) *BOWEL PROBLEMS (unusual diarrhea, constipation, pain near the anus) TENDERNESS IN MOUTH AND THROAT WITH OR WITHOUT PRESENCE OF ULCERS (sore throat, sores in mouth, or a toothache) UNUSUAL RASH, SWELLING OR PAIN  UNUSUAL VAGINAL DISCHARGE OR ITCHING   Items with * indicate a potential emergency and should be followed up as soon as possible or go to the Emergency Department if any problems should occur.  Please show the CHEMOTHERAPY ALERT CARD or  IMMUNOTHERAPY ALERT CARD at check-in to the Emergency Department and triage nurse.  Should you have questions after your visit or need to cancel or reschedule your appointment, please contact [facility] - [facility]  Dept: [phone]  and follow the prompts.  Office hours are 8:00 a.m. to 4:30 p.m. Monday - Friday. Please note that voicemails left after 4:00 p.m. may not be returned until the following business day.  We are closed weekends and major holidays. You have access to a nurse at all times for urgent questions. Please call the main number to the clinic Dept: [phone] and follow the prompts.   For any non-urgent questions, you may also contact your provider using MyChart. We now offer e-Visits for anyone 81 and older to request care online for non-urgent symptoms. For details visit [email or web address].   Also download the MyChart app! Go to the app store, search "MyChart", open the app, select [place], and log in with your MyChart username and password.

## 2024-04-21 NOTE — Progress Notes (Unsigned)
 [facility] [phone]   [phone]  PROGRESS NOTE  Patient Care Team: Shayne Anes, MD as PCP - General (Internal Medicine)  Hematological/Oncological History # Free Lambda Multiple Myeloma 11/10/2022: WBC 5.68, Hgb 11.7, MCV 97.4, Plt 146 04/17/2023: WBC 4.53, Hgb 11.5, MCV 107.6, Plt 110 05/16/2023: establish care with Dr. Federico. Labs showed M protein 0.1 with Lambda 900, Kappa 3 with ratio 0.00.   06/01/2023: bone marrow biopsy performed, showed plasma cell myeloma nearly effacing approximately 80% of the cellular marrow.  06/25/2023: Cycle 1 Day 1 of VRD chemotherapy 07/23/2023: Cycle 2 Day 1 of VRD chemotherapy 08/13/2023: Cycle 3 Day 1 of VRD chemotherapy 09/03/2023: Cycle 1 Day 1 of Dara/Rev/Dex. 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 12/17/2023: Cycle 1 Day 1 of Kyprolis . Dara/Rev ineffective.   Interval History:  Brooke Whitaker 73 y.o. female with medical history significant for newly diagnosed multiple myeloma who presents for a follow up visit. The patient's last visit was on 04/07/2024. In the interim since the last visit she has had no major changes in her health.  On exam today Mrs. Bahr reports she would like to continue waiting before adding Pomalyst.  She is aware of her rising lambda light chains and we discussed this at length with her in the infusion room last week.  She reports that she does have a spot on her bottom which is red and irritating which does occasionally have some itching.  She reports that she has been applying some Aquaphor to it but is not yet attempted steroid cream.  She does have cortisone cream at home which she can try.  She reports otherwise she is not having any numbness or tingling of her fingers and toes.  She is not having any nausea, vomiting, or diarrhea.  She reports her energy and appetite are both okay.  She reports that the lower dose of the steroids did not help with her headache and therefore she  is going to increase back to the full dose of 40 mg p.o. Dex prior to treatment.  Otherwise she denies any fevers, chills, sweats, nausea, vomiting or diarrhea.  Full 10 point ROS is otherwise negative.   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    Sigmoid diverticulosis    Wears glasses     SURGICAL HISTORY: Past Surgical History:  Procedure Laterality Date   BREAST BIOPSY Right 09/2011   benign    COLONOSCOPY  2010   CYSTOSCOPY/RETROGRADE/URETEROSCOPY/STONE EXTRACTION WITH BASKET Left 04/21/2014   Procedure: cystoscopy, left retrograde pylegram, LEFT URETEROSCOPY, laser lithotripsy with holmium laser, STONE EXTRACTION, uretheral calibration;  Surgeon: Norleen JINNY Seltzer, MD;  Location: [facility];  Service: Urology;  Laterality: Left;   DILATION AND EVACUATION  1995   EPISIOTOMY REPAIR, SEVERE TEAR  1989   IR IMAGING GUIDED PORT INSERTION  02/04/2024   LAPAROSCOPIC OVARIAN CYSTECTOMY  10/ 2000   NM MYOCAR PERF WALL MOTION  12/15/2009   Normal   US  ECHOCARDIOGRAPHY  12/15/2009   Normal study for age: mild MR,TR and trace PI    SOCIAL HISTORY: Social History   Socioeconomic History   Marital status: Married    Spouse name: Not on file   Number of children: 2  Years of education: Not on file   Highest education level: Not on file  Occupational History   Occupation: Print production planner  Tobacco Use   Smoking status: Never    Passive exposure: Never   Smokeless tobacco: Never  Vaping Use   Vaping status: Never Used  Substance and Sexual Activity   Alcohol  use: Yes    Alcohol /week: 2.0 standard drinks of alcohol     Types: 2 Glasses of wine per week   Drug use: No   Sexual activity: Not Currently    Partners: Male  Other Topics Concern   Not on file  Social History Narrative   Not on  file   Social Drivers of Health   Financial Resource Strain: Not on file  Food Insecurity: No Food Insecurity (05/16/2023)   Hunger Vital Sign    Worried About Running Out of Food in the Last Year: Never true    Ran Out of Food in the Last Year: Never true  Transportation Needs: No Transportation Needs (05/16/2023)   PRAPARE - Administrator, Civil Service (Medical): No    Lack of Transportation (Non-Medical): No  Physical Activity: Not on file  Stress: Not on file  Social Connections: Not on file  Intimate Partner Violence: Not At Risk (05/16/2023)   Humiliation, Afraid, Rape, and Kick questionnaire    Fear of Current or Ex-Partner: No    Emotionally Abused: No    Physically Abused: No    Sexually Abused: No    FAMILY HISTORY: Family History  Problem Relation Age of Onset   Diabetes Mother    Congestive Heart Failure Mother    Stroke Mother    Osteoporosis Mother    Other Mother        gallbladder ruptured   Heart attack Father    Diabetes Sister    Heart disease Sister    Heart disease Sister        x2   Hypertension Sister    Thyroid  disease Sister    Hypertension Sister    Diabetes Brother    Dementia Brother    Colon cancer Brother        mets to liver   Heart disease Brother        pacemaker   Stroke Brother    Liver cancer Brother    Heart disease Brother    Stomach cancer Neg Hx    Pancreatic cancer Neg Hx    Esophageal cancer Neg Hx     ALLERGIES:  is allergic to penicillins and sulfa antibiotics.  MEDICATIONS:  Current Outpatient Medications  Medication Sig Dispense Refill   lidocaine -prilocaine  (EMLA ) cream Apply 1 Application topically as needed. 30 g 1   acetaminophen  (TYLENOL ) 500 MG tablet Take 500 mg by mouth every 6 (six) hours as needed for mild pain or headache.      cetirizine (ZYRTEC) 5 MG tablet Take 5 mg by mouth daily as needed for allergies.     cholecalciferol (VITAMIN D ) 1000 UNITS tablet Take 1,000 Units by mouth every  morning.      Cyanocobalamin  (VITAMIN B 12 PO) Take 1,000 mcg by mouth daily.     dexamethasone  (DECADRON ) 4 MG tablet Take 10 tablets (40 mg total) by mouth once a week. Take once weekly on the day you receive the chemotherapy injection. 40 tablet 5   doxycycline (MONODOX) 100 MG capsule Take 100 mg by mouth 2 (two) times daily. For 7 days     ELDERBERRY PO Take  1 tablet by mouth daily.     Elderberry-Vitamin C-Zinc (ELDERBERRY IMMUNE HEALTH GUMMY PO) Take by mouth daily.     fluticasone (FLONASE) 50 MCG/ACT nasal spray Place into the nose as needed.     HYDROcodone -acetaminophen  (NORCO/VICODIN) 5-325 MG tablet Take 1 tablet by mouth every 6 (six) hours as needed for moderate pain (pain score 4-6). 10 tablet 0   levocetirizine (XYZAL) 5 MG tablet Take 5 mg by mouth every evening.     multivitamin-lutein (OCUVITE-LUTEIN) CAPS capsule Take 1 capsule by mouth daily.     ondansetron  (ZOFRAN ) 8 MG tablet Take 1 tablet (8 mg total) by mouth every 8 (eight) hours as needed. 30 tablet 0   prochlorperazine  (COMPAZINE ) 10 MG tablet Take 1 tablet (10 mg total) by mouth every 6 (six) hours as needed for nausea or vomiting. 30 tablet 0   rosuvastatin (CRESTOR) 5 MG tablet Take 5 mg by mouth daily.     telmisartan (MICARDIS) 20 MG tablet Take 20 mg by mouth daily.     traMADol  (ULTRAM ) 50 MG tablet Take 1 tablet (50 mg total) by mouth every 6 (six) hours as needed. 30 tablet 0   valACYclovir  (VALTREX ) 500 MG tablet Take 500 mg by mouth daily.     No current facility-administered medications for this visit.    REVIEW OF SYSTEMS:   Constitutional: ( - ) fevers, ( - )  chills , ( - ) night sweats Eyes: ( - ) blurriness of vision, ( - ) double vision, ( - ) watery eyes Ears, nose, mouth, throat, and face: ( - ) mucositis, ( - ) sore throat Respiratory: ( - ) cough, ( - ) dyspnea, ( - ) wheezes Cardiovascular: ( - ) palpitation, ( - ) chest discomfort, ( - ) lower extremity swelling Gastrointestinal:  ( - )  nausea, ( - ) heartburn, ( - ) change in bowel habits Skin: ( - ) abnormal skin rashes Lymphatics: ( - ) new lymphadenopathy, ( - ) easy bruising Neurological: ( - ) numbness, ( - ) tingling, ( - ) new weaknesses Behavioral/Psych: ( - ) mood change, ( - ) new changes  All other systems were reviewed with the patient and are negative.  PHYSICAL EXAMINATION: ECOG PERFORMANCE STATUS: 0 - Asymptomatic  Vitals:   04/21/24 1141  BP: (!) 143/76  Pulse: 81  Resp: 15  Temp: 98.5 F (36.9 C)  SpO2: 95%     Filed Weights   04/21/24 1141  Weight: 141 lb 14.4 oz (64.4 kg)      GENERAL: Well-appearing elderly Caucasian female, alert, no distress and comfortable SKIN: skin color, texture, turgor are normal, no rashes or significant lesions EYES: conjunctiva are pink and non-injected, sclera clear LUNGS: clear to auscultation and percussion with normal breathing effort HEART: regular rate & rhythm and no murmurs and no lower extremity edema Musculoskeletal: no cyanosis of digits and no clubbing  PSYCH: alert & oriented x 3, fluent speech NEURO: no focal motor/sensory deficits  LABORATORY DATA:  I have reviewed the data as listed    Latest Ref Rng & Units 04/21/2024   11:11 AM 04/14/2024    1:57 PM 04/07/2024    2:19 PM  CBC  WBC 4.0 - 10.5 K/uL 7.6  8.8  7.2   Hemoglobin 12.0 - 15.0 g/dL 86.0  86.0  86.0   Hematocrit 36.0 - 46.0 % 41.2  41.0  41.1   Platelets 150 - 400 K/uL 157  134  209  Latest Ref Rng & Units 04/21/2024   11:11 AM 04/14/2024    1:57 PM 04/07/2024    2:19 PM  CMP  Glucose 70 - 99 mg/dL 893  848  746   BUN 8 - 23 mg/dL 18  22  17    Creatinine 0.44 - 1.00 mg/dL 9.41  9.43  9.38   Sodium 135 - 145 mmol/L 143  141  140   Potassium 3.5 - 5.1 mmol/L 4.2  4.1  4.0   Chloride 98 - 111 mmol/L 109  108  107   CO2 22 - 32 mmol/L 29  26  25    Calcium 8.9 - 10.3 mg/dL 9.0  9.1  9.4   Total Protein 6.5 - 8.1 g/dL 5.8  6.1  6.2   Total Bilirubin 0.0 - 1.2 mg/dL 1.2   1.2  1.2   Alkaline Phos 38 - 126 U/L 73  82  89   AST 15 - 41 U/L 15  15  17    ALT 0 - 44 U/L 17  16  15      Lab Results  Component Value Date   MPROTEIN Not Observed 04/07/2024   MPROTEIN 0.1 (H) 02/26/2024   MPROTEIN 0.1 (H) 01/21/2024   Lab Results  Component Value Date   KPAFRELGTCHN 1.3 (L) 04/07/2024   KPAFRELGTCHN 1.2 (L) 02/26/2024   KPAFRELGTCHN 1.2 (L) 01/21/2024   LAMBDASER 50.2 (H) 04/07/2024   LAMBDASER 44.7 (H) 02/26/2024   LAMBDASER 41.5 (H) 01/21/2024   KAPLAMBRATIO 0.03 (L) 04/07/2024   KAPLAMBRATIO 0.03 (L) 02/26/2024   KAPLAMBRATIO 0.03 (L) 01/21/2024    RADIOGRAPHIC STUDIES: No results found.   ASSESSMENT & PLAN Brooke Whitaker 73 y.o. female with medical history significant for newly diagnosed multiple myeloma who presents for a follow up visit.   Previously we discussed the diagnosis of multiple myeloma.  We discussed that this is a blood cancer that is not curable.  The goal is to get the patient into a durable remission with treatment.  We discussed the need for a minimum of 6 months of chemotherapy treatment then followed by maintenance therapy if the M protein has reached 0 and the free light chains are within normal limits.  We discussed expected side effects of the regimen and the plan moving forward.  The patient voiced her understanding of our findings, expected side effects, and risks/benefits.  She is okay with proceeding with chemotherapy at this time.  # Free Lambda Multiple Myeloma -- Diagnosis confirmed by the presence of a macrocytic anemia and 80% plasma cells in the bone marrow --Initially started with VRD chemotherapy with the patient developed rash as result of the Revlimid  and ocular toxicity from the Velcade .  Dropped the Revlimid  down to 10 mg p.o. daily and transition to Darzalex  for her next treatment. --UPEP showed marked Bence Jones proteins in the urine, no evidence of lytic lesions on metastatic survey PLAN: -- Continue  Kyprolis /Dex therapy.  Today is Cycle 5 Day 15 --Labs today show white blood cell 7.6, Hgb 13.9, MCV 97.2, Plt 157  --On 02/26/2024 patient had kappa 1.3, lambda 50.2, ratio 0.03 with an M protein undetectable.  -- Plan for return to clinic in 2 weeks with interval continued Kyprolis  therapy.  #Supportive Care -- chemotherapy education complete -- port placed -- zofran  8mg  q8H PRN and compazine  10mg  PO q6H for nausea -- acyclovir  400mg  PO BID for VCZ prophylaxis -- EMLA  cream for port -- Zometa to be added after dental clearance.  --  no pain medication required at this time.    No orders of the defined types were placed in this encounter.   All questions were answered. The patient knows to call the clinic with any problems, questions or concerns.  A total of more than 30 minutes were spent on this encounter with face-to-face time and non-face-to-face time, including preparing to see the patient, ordering tests and/or medications, counseling the patient and coordination of care as outlined above.   Norleen IVAR Kidney, MD Department of Hematology/Oncology [facility] at [facility] Phone: [phone] Pager: [phone] Email: [email or web address]  04/22/2024 9:03 AM

## 2024-04-22 ENCOUNTER — Encounter: Payer: Self-pay | Admitting: Hematology and Oncology

## 2024-04-25 ENCOUNTER — Other Ambulatory Visit: Payer: Self-pay

## 2024-04-29 ENCOUNTER — Other Ambulatory Visit: Payer: Self-pay

## 2024-05-05 ENCOUNTER — Inpatient Hospital Stay

## 2024-05-05 ENCOUNTER — Other Ambulatory Visit

## 2024-05-05 ENCOUNTER — Inpatient Hospital Stay: Admitting: Hematology and Oncology

## 2024-05-05 VITALS — BP 138/58 | HR 80 | Temp 97.6°F | Resp 16 | Ht 61.0 in | Wt 143.2 lb

## 2024-05-05 DIAGNOSIS — R519 Headache, unspecified: Secondary | ICD-10-CM

## 2024-05-05 DIAGNOSIS — C9000 Multiple myeloma not having achieved remission: Secondary | ICD-10-CM

## 2024-05-05 DIAGNOSIS — Z95828 Presence of other vascular implants and grafts: Secondary | ICD-10-CM

## 2024-05-05 DIAGNOSIS — Z5112 Encounter for antineoplastic immunotherapy: Secondary | ICD-10-CM | POA: Diagnosis not present

## 2024-05-05 LAB — CMP (CANCER CENTER ONLY)
ALT: 13 U/L (ref 0–44)
AST: 15 U/L (ref 15–41)
Albumin: 3.9 g/dL (ref 3.5–5.0)
Alkaline Phosphatase: 77 U/L (ref 38–126)
Anion gap: 5 (ref 5–15)
BUN: 17 mg/dL (ref 8–23)
CO2: 27 mmol/L (ref 22–32)
Calcium: 8.6 mg/dL — ABNORMAL LOW (ref 8.9–10.3)
Chloride: 110 mmol/L (ref 98–111)
Creatinine: 0.61 mg/dL (ref 0.44–1.00)
GFR, Estimated: >60 mL/min (ref >60)
Glucose, Bld: 134 mg/dL — ABNORMAL HIGH (ref 70–99)
Potassium: 4.1 mmol/L (ref 3.5–5.1)
Sodium: 142 mmol/L (ref 135–145)
Total Bilirubin: 1.3 mg/dL — ABNORMAL HIGH (ref 0.0–1.2)
Total Protein: 5.6 g/dL — ABNORMAL LOW (ref 6.5–8.1)

## 2024-05-05 LAB — CBC WITH DIFFERENTIAL (CANCER CENTER ONLY)
Abs Immature Granulocytes: 0.03 K/uL (ref 0.00–0.07)
Basophils Absolute: 0 K/uL (ref 0.0–0.1)
Basophils Relative: 0 %
Eosinophils Absolute: 0 K/uL (ref 0.0–0.5)
Eosinophils Relative: 0 %
HCT: 40.1 % (ref 36.0–46.0)
Hemoglobin: 13.2 g/dL (ref 12.0–15.0)
Immature Granulocytes: 0 %
Lymphocytes Relative: 9 %
Lymphs Abs: 0.7 K/uL (ref 0.7–4.0)
MCH: 32 pg (ref 26.0–34.0)
MCHC: 32.9 g/dL (ref 30.0–36.0)
MCV: 97.1 fL (ref 80.0–100.0)
Monocytes Absolute: 0.1 K/uL (ref 0.1–1.0)
Monocytes Relative: 2 %
Neutro Abs: 7.1 K/uL (ref 1.7–7.7)
Neutrophils Relative %: 89 %
Platelet Count: 186 K/uL (ref 150–400)
RBC: 4.13 MIL/uL (ref 3.87–5.11)
RDW: 12.1 % (ref 11.5–15.5)
WBC Count: 8 K/uL (ref 4.0–10.5)
nRBC: 0 % (ref 0.0–0.2)

## 2024-05-05 MED ORDER — SODIUM CHLORIDE 0.9% FLUSH
10.0000 mL | INTRAVENOUS | Status: DC | PRN
  Administered 2024-05-05: 10 mL

## 2024-05-05 MED ORDER — HEPARIN SOD (PORK) LOCK FLUSH 100 UNIT/ML IV SOLN
500.0000 [IU] | Freq: Once | INTRAVENOUS | Status: AC | PRN
Start: 2024-05-05 — End: 2024-05-05
  Administered 2024-05-05: 500 [IU]

## 2024-05-05 MED ORDER — SODIUM CHLORIDE 0.9% FLUSH
10.0000 mL | Freq: Once | INTRAVENOUS | Status: AC
  Administered 2024-05-05: 10 mL

## 2024-05-05 MED ORDER — SODIUM CHLORIDE 0.9 % IV SOLN: Freq: Once | INTRAVENOUS | Status: AC

## 2024-05-05 MED ORDER — DEXTROSE 5 % IV SOLN
70.0000 mg/m2 | Freq: Once | INTRAVENOUS | Status: AC
  Administered 2024-05-05: 120 mg via INTRAVENOUS
  Filled 2024-05-05: qty 60

## 2024-05-05 MED ORDER — SODIUM CHLORIDE 0.9 % IV SOLN
INTRAVENOUS | Status: DC
Start: 2024-05-05 — End: 2024-05-05

## 2024-05-05 NOTE — Progress Notes (Unsigned)
 [facility] [phone]   [phone]  PROGRESS NOTE  Patient Care Team: Shayne Anes, MD as PCP - General (Internal Medicine)  Hematological/Oncological History # Free Lambda Multiple Myeloma 11/10/2022: WBC 5.68, Hgb 11.7, MCV 97.4, Plt 146 04/17/2023: WBC 4.53, Hgb 11.5, MCV 107.6, Plt 110 05/16/2023: establish care with Dr. Federico. Labs showed M protein 0.1 with Lambda 900, Kappa 3 with ratio 0.00.   06/01/2023: bone marrow biopsy performed, showed plasma cell myeloma nearly effacing approximately 80% of the cellular marrow.  06/25/2023: Cycle 1 Day 1 of VRD chemotherapy 07/23/2023: Cycle 2 Day 1 of VRD chemotherapy 08/13/2023: Cycle 3 Day 1 of VRD chemotherapy 09/03/2023: Cycle 1 Day 1 of Dara/Rev/Dex. 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 12/17/2023: Cycle 1 Day 1 of Kyprolis . Dara/Rev ineffective.   Interval History:  Brooke Whitaker 73 y.o. female with medical history significant for newly diagnosed multiple myeloma who presents for a follow up visit. The patient's last visit was on 04/21/2024. In the interim since the last visit she has had no major changes in her health.  On exam today Mrs. Yarde reports she has been doing her best to stay cool and stay inside this hot summer.  She reports that she did have a good girls trip to [place] recently for 4 days.  She reports her energy and appetite have been good but not great.  She reports that she did take her 40 mg of Dex today and is concerned that she will develop a headache tonight or tomorrow as result of this.  She reports otherwise she is tolerating the Kyprolis  well with no neuropathy, nausea, vomiting, or diarrhea.  Full 10 point ROS is otherwise negative.  The bulk of our discussion focused on steps moving forward.  We talked about the addition of Pomalyst in addition to alternative therapies including CAR-T and by specific therapy.  She voiced her understanding of the treatment options  and would like to continue with Kyprolis  moving forward.   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    Sigmoid diverticulosis    Wears glasses     SURGICAL HISTORY: Past Surgical History:  Procedure Laterality Date   BREAST BIOPSY Right 09/2011   benign    COLONOSCOPY  2010   CYSTOSCOPY/RETROGRADE/URETEROSCOPY/STONE EXTRACTION WITH BASKET Left 04/21/2014   Procedure: cystoscopy, left retrograde pylegram, LEFT URETEROSCOPY, laser lithotripsy with holmium laser, STONE EXTRACTION, uretheral calibration;  Surgeon: Norleen JINNY Seltzer, MD;  Location: [facility];  Service: Urology;  Laterality: Left;   DILATION AND EVACUATION  1995   EPISIOTOMY REPAIR, SEVERE TEAR  1989   IR IMAGING GUIDED PORT INSERTION  02/04/2024   LAPAROSCOPIC OVARIAN CYSTECTOMY  10/ 2000   NM MYOCAR PERF WALL MOTION  12/15/2009   Normal   US  ECHOCARDIOGRAPHY  12/15/2009   Normal study for age: mild MR,TR and trace PI    SOCIAL HISTORY: Social History   Socioeconomic History   Marital status: Married    Spouse name: Not on file   Number of children: 2   Years of education: Not on file   Highest education level: Not on file  Occupational History   Occupation: Print production planner  Tobacco Use   Smoking status: Never  Passive exposure: Never   Smokeless tobacco: Never  Vaping Use   Vaping status: Never Used  Substance and Sexual Activity   Alcohol  use: Yes    Alcohol /week: 2.0 standard drinks of alcohol     Types: 2 Glasses of wine per week   Drug use: No   Sexual activity: Not Currently    Partners: Male  Other Topics Concern   Not on file  Social History Narrative   Not on file   Social Drivers of Health   Financial Resource Strain: Not on file  Food Insecurity: No Food Insecurity (05/16/2023)   Hunger Vital Sign     Worried About Running Out of Food in the Last Year: Never true    Ran Out of Food in the Last Year: Never true  Transportation Needs: No Transportation Needs (05/16/2023)   PRAPARE - Administrator, Civil Service (Medical): No    Lack of Transportation (Non-Medical): No  Physical Activity: Not on file  Stress: Not on file  Social Connections: Not on file  Intimate Partner Violence: Not At Risk (05/16/2023)   Humiliation, Afraid, Rape, and Kick questionnaire    Fear of Current or Ex-Partner: No    Emotionally Abused: No    Physically Abused: No    Sexually Abused: No    FAMILY HISTORY: Family History  Problem Relation Age of Onset   Diabetes Mother    Congestive Heart Failure Mother    Stroke Mother    Osteoporosis Mother    Other Mother        gallbladder ruptured   Heart attack Father    Diabetes Sister    Heart disease Sister    Heart disease Sister        x2   Hypertension Sister    Thyroid  disease Sister    Hypertension Sister    Diabetes Brother    Dementia Brother    Colon cancer Brother        mets to liver   Heart disease Brother        pacemaker   Stroke Brother    Liver cancer Brother    Heart disease Brother    Stomach cancer Neg Hx    Pancreatic cancer Neg Hx    Esophageal cancer Neg Hx     ALLERGIES:  is allergic to penicillins and sulfa antibiotics.  MEDICATIONS:  Current Outpatient Medications  Medication Sig Dispense Refill   lidocaine -prilocaine  (EMLA ) cream Apply 1 Application topically as needed. 30 g 1   acetaminophen  (TYLENOL ) 500 MG tablet Take 500 mg by mouth every 6 (six) hours as needed for mild pain or headache.      cetirizine (ZYRTEC) 5 MG tablet Take 5 mg by mouth daily as needed for allergies.     cholecalciferol (VITAMIN D ) 1000 UNITS tablet Take 1,000 Units by mouth every morning.      Cyanocobalamin  (VITAMIN B 12 PO) Take 1,000 mcg by mouth daily.     dexamethasone  (DECADRON ) 4 MG tablet Take 10 tablets (40 mg  total) by mouth once a week. Take once weekly on the day you receive the chemotherapy injection. 40 tablet 5   doxycycline (MONODOX) 100 MG capsule Take 100 mg by mouth 2 (two) times daily. For 7 days     ELDERBERRY PO Take 1 tablet by mouth daily.     Elderberry-Vitamin C-Zinc (ELDERBERRY IMMUNE HEALTH GUMMY PO) Take by mouth daily.     fluticasone (FLONASE) 50 MCG/ACT nasal spray Place into the  nose as needed.     HYDROcodone -acetaminophen  (NORCO/VICODIN) 5-325 MG tablet Take 1 tablet by mouth every 6 (six) hours as needed for moderate pain (pain score 4-6). 10 tablet 0   levocetirizine (XYZAL) 5 MG tablet Take 5 mg by mouth every evening.     multivitamin-lutein (OCUVITE-LUTEIN) CAPS capsule Take 1 capsule by mouth daily.     ondansetron  (ZOFRAN ) 8 MG tablet Take 1 tablet (8 mg total) by mouth every 8 (eight) hours as needed. 30 tablet 0   prochlorperazine  (COMPAZINE ) 10 MG tablet Take 1 tablet (10 mg total) by mouth every 6 (six) hours as needed for nausea or vomiting. 30 tablet 0   rosuvastatin (CRESTOR) 5 MG tablet Take 5 mg by mouth daily.     telmisartan (MICARDIS) 20 MG tablet Take 20 mg by mouth daily.     traMADol  (ULTRAM ) 50 MG tablet Take 1 tablet (50 mg total) by mouth every 6 (six) hours as needed. 30 tablet 0   valACYclovir  (VALTREX ) 500 MG tablet Take 500 mg by mouth daily.     No current facility-administered medications for this visit.    REVIEW OF SYSTEMS:   Constitutional: ( - ) fevers, ( - )  chills , ( - ) night sweats Eyes: ( - ) blurriness of vision, ( - ) double vision, ( - ) watery eyes Ears, nose, mouth, throat, and face: ( - ) mucositis, ( - ) sore throat Respiratory: ( - ) cough, ( - ) dyspnea, ( - ) wheezes Cardiovascular: ( - ) palpitation, ( - ) chest discomfort, ( - ) lower extremity swelling Gastrointestinal:  ( - ) nausea, ( - ) heartburn, ( - ) change in bowel habits Skin: ( - ) abnormal skin rashes Lymphatics: ( - ) new lymphadenopathy, ( - ) easy  bruising Neurological: ( - ) numbness, ( - ) tingling, ( - ) new weaknesses Behavioral/Psych: ( - ) mood change, ( - ) new changes  All other systems were reviewed with the patient and are negative.  PHYSICAL EXAMINATION: ECOG PERFORMANCE STATUS: 0 - Asymptomatic  There were no vitals filed for this visit.    There were no vitals filed for this visit.     GENERAL: Well-appearing elderly Caucasian female, alert, no distress and comfortable SKIN: skin color, texture, turgor are normal, no rashes or significant lesions EYES: conjunctiva are pink and non-injected, sclera clear LUNGS: clear to auscultation and percussion with normal breathing effort HEART: regular rate & rhythm and no murmurs and no lower extremity edema Musculoskeletal: no cyanosis of digits and no clubbing  PSYCH: alert & oriented x 3, fluent speech NEURO: no focal motor/sensory deficits  LABORATORY DATA:  I have reviewed the data as listed    Latest Ref Rng & Units 04/21/2024   11:11 AM 04/14/2024    1:57 PM 04/07/2024    2:19 PM  CBC  WBC 4.0 - 10.5 K/uL 7.6  8.8  7.2   Hemoglobin 12.0 - 15.0 g/dL 86.0  86.0  86.0   Hematocrit 36.0 - 46.0 % 41.2  41.0  41.1   Platelets 150 - 400 K/uL 157  134  209        Latest Ref Rng & Units 04/21/2024   11:11 AM 04/14/2024    1:57 PM 04/07/2024    2:19 PM  CMP  Glucose 70 - 99 mg/dL 893  848  746   BUN 8 - 23 mg/dL 18  22  17    Creatinine  0.44 - 1.00 mg/dL 9.41  9.43  9.38   Sodium 135 - 145 mmol/L 143  141  140   Potassium 3.5 - 5.1 mmol/L 4.2  4.1  4.0   Chloride 98 - 111 mmol/L 109  108  107   CO2 22 - 32 mmol/L 29  26  25    Calcium 8.9 - 10.3 mg/dL 9.0  9.1  9.4   Total Protein 6.5 - 8.1 g/dL 5.8  6.1  6.2   Total Bilirubin 0.0 - 1.2 mg/dL 1.2  1.2  1.2   Alkaline Phos 38 - 126 U/L 73  82  89   AST 15 - 41 U/L 15  15  17    ALT 0 - 44 U/L 17  16  15      Lab Results  Component Value Date   MPROTEIN Not Observed 04/07/2024   MPROTEIN 0.1 (H) 02/26/2024    MPROTEIN 0.1 (H) 01/21/2024   Lab Results  Component Value Date   KPAFRELGTCHN 1.3 (L) 04/07/2024   KPAFRELGTCHN 1.2 (L) 02/26/2024   KPAFRELGTCHN 1.2 (L) 01/21/2024   LAMBDASER 50.2 (H) 04/07/2024   LAMBDASER 44.7 (H) 02/26/2024   LAMBDASER 41.5 (H) 01/21/2024   KAPLAMBRATIO 0.03 (L) 04/07/2024   KAPLAMBRATIO 0.03 (L) 02/26/2024   KAPLAMBRATIO 0.03 (L) 01/21/2024    RADIOGRAPHIC STUDIES: No results found.   ASSESSMENT & PLAN CARIANNE TAIRA 73 y.o. female with medical history significant for newly diagnosed multiple myeloma who presents for a follow up visit.   Previously we discussed the diagnosis of multiple myeloma.  We discussed that this is a blood cancer that is not curable.  The goal is to get the patient into a durable remission with treatment.  We discussed the need for a minimum of 6 months of chemotherapy treatment then followed by maintenance therapy if the M protein has reached 0 and the free light chains are within normal limits.  We discussed expected side effects of the regimen and the plan moving forward.  The patient voiced her understanding of our findings, expected side effects, and risks/benefits.  She is okay with proceeding with chemotherapy at this time.  # Free Lambda Multiple Myeloma -- Diagnosis confirmed by the presence of a macrocytic anemia and 80% plasma cells in the bone marrow --Initially started with VRD chemotherapy with the patient developed rash as result of the Revlimid  and ocular toxicity from the Velcade .  Dropped the Revlimid  down to 10 mg p.o. daily and transition to Darzalex  for her next treatment. --UPEP showed marked Bence Jones proteins in the urine, no evidence of lytic lesions on metastatic survey PLAN: -- Continue Kyprolis /Dex therapy.  Today is Cycle 6 Day 8 --Labs today show white blood cell 8.0, hemoglobin 13.2, MCV 97.1, platelets 187 --On 02/26/2024 patient had kappa 1.3, lambda 50.2, ratio 0.03 with an M protein undetectable.  --  Plan for return to clinic in 2 weeks with interval continued Kyprolis  therapy.  #Supportive Care -- chemotherapy education complete -- port placed -- zofran  8mg  q8H PRN and compazine  10mg  PO q6H for nausea -- acyclovir  400mg  PO BID for VCZ prophylaxis -- EMLA  cream for port -- Zometa to be added after dental clearance.  -- no pain medication required at this time.    No orders of the defined types were placed in this encounter.   All questions were answered. The patient knows to call the clinic with any problems, questions or concerns.  A total of more than 30 minutes were spent  on this encounter with face-to-face time and non-face-to-face time, including preparing to see the patient, ordering tests and/or medications, counseling the patient and coordination of care as outlined above.   Norleen IVAR Kidney, MD Department of Hematology/Oncology [facility] Phone: [phone] Pager: [phone] Email: [email or web address]  05/05/2024 7:35 AM

## 2024-05-05 NOTE — Patient Instructions (Signed)

## 2024-05-06 ENCOUNTER — Encounter: Payer: Self-pay | Admitting: Hematology and Oncology

## 2024-05-06 LAB — KAPPA/LAMBDA LIGHT CHAINS
Kappa free light chain: 1.4 mg/L — ABNORMAL LOW (ref 3.3–19.4)
Kappa, lambda light chain ratio: 0.03 — ABNORMAL LOW (ref 0.26–1.65)
Lambda free light chains: 54.2 mg/L — ABNORMAL HIGH (ref 5.7–26.3)

## 2024-05-07 LAB — MULTIPLE MYELOMA PANEL, SERUM
Albumin SerPl Elph-Mcnc: 3.7 g/dL (ref 2.9–4.4)
Albumin/Glob SerPl: 2.4 — ABNORMAL HIGH (ref 0.7–1.7)
Alpha 1: 0.2 g/dL (ref 0.0–0.4)
Alpha2 Glob SerPl Elph-Mcnc: 0.7 g/dL (ref 0.4–1.0)
B-Globulin SerPl Elph-Mcnc: 0.7 g/dL (ref 0.7–1.3)
Gamma Glob SerPl Elph-Mcnc: 0.1 g/dL — ABNORMAL LOW (ref 0.4–1.8)
Globulin, Total: 1.6 g/dL — ABNORMAL LOW (ref 2.2–3.9)
IgA: <5 mg/dL — ABNORMAL LOW (ref 64–422)
IgG (Immunoglobin G), Serum: 180 mg/dL — ABNORMAL LOW (ref 586–1602)
IgM (Immunoglobulin M), Srm: <5 mg/dL — ABNORMAL LOW (ref 26–217)
Total Protein ELP: 5.3 g/dL — ABNORMAL LOW (ref 6.0–8.5)

## 2024-05-12 ENCOUNTER — Inpatient Hospital Stay

## 2024-05-12 VITALS — BP 138/58 | HR 78 | Temp 98.2°F | Resp 18 | Wt 143.2 lb

## 2024-05-12 DIAGNOSIS — C9000 Multiple myeloma not having achieved remission: Secondary | ICD-10-CM

## 2024-05-12 DIAGNOSIS — Z5112 Encounter for antineoplastic immunotherapy: Secondary | ICD-10-CM | POA: Diagnosis not present

## 2024-05-12 DIAGNOSIS — Z95828 Presence of other vascular implants and grafts: Secondary | ICD-10-CM

## 2024-05-12 LAB — CBC WITH DIFFERENTIAL (CANCER CENTER ONLY)
Abs Immature Granulocytes: 0.03 K/uL (ref 0.00–0.07)
Basophils Absolute: 0 K/uL (ref 0.0–0.1)
Basophils Relative: 0 %
Eosinophils Absolute: 0 K/uL (ref 0.0–0.5)
Eosinophils Relative: 0 %
HCT: 41.8 % (ref 36.0–46.0)
Hemoglobin: 14 g/dL (ref 12.0–15.0)
Immature Granulocytes: 1 %
Lymphocytes Relative: 9 %
Lymphs Abs: 0.6 K/uL — ABNORMAL LOW (ref 0.7–4.0)
MCH: 32.3 pg (ref 26.0–34.0)
MCHC: 33.5 g/dL (ref 30.0–36.0)
MCV: 96.5 fL (ref 80.0–100.0)
Monocytes Absolute: 0 K/uL — ABNORMAL LOW (ref 0.1–1.0)
Monocytes Relative: 1 %
Neutro Abs: 5.6 K/uL (ref 1.7–7.7)
Neutrophils Relative %: 89 %
Platelet Count: 127 K/uL — ABNORMAL LOW (ref 150–400)
RBC: 4.33 MIL/uL (ref 3.87–5.11)
RDW: 12.2 % (ref 11.5–15.5)
WBC Count: 6.2 K/uL (ref 4.0–10.5)
nRBC: 0 % (ref 0.0–0.2)

## 2024-05-12 LAB — CMP (CANCER CENTER ONLY)
ALT: 15 U/L (ref 0–44)
AST: 13 U/L — ABNORMAL LOW (ref 15–41)
Albumin: 4.1 g/dL (ref 3.5–5.0)
Alkaline Phosphatase: 86 U/L (ref 38–126)
Anion gap: 8 (ref 5–15)
BUN: 16 mg/dL (ref 8–23)
CO2: 27 mmol/L (ref 22–32)
Calcium: 8.9 mg/dL (ref 8.9–10.3)
Chloride: 106 mmol/L (ref 98–111)
Creatinine: 0.69 mg/dL (ref 0.44–1.00)
GFR, Estimated: >60 mL/min (ref >60)
Glucose, Bld: 184 mg/dL — ABNORMAL HIGH (ref 70–99)
Potassium: 4 mmol/L (ref 3.5–5.1)
Sodium: 141 mmol/L (ref 135–145)
Total Bilirubin: 1.5 mg/dL — ABNORMAL HIGH (ref 0.0–1.2)
Total Protein: 6.1 g/dL — ABNORMAL LOW (ref 6.5–8.1)

## 2024-05-12 MED ORDER — SODIUM CHLORIDE 0.9 % IV SOLN
Freq: Once | INTRAVENOUS | Status: AC
Start: 2024-05-12 — End: 2024-05-12

## 2024-05-12 MED ORDER — SODIUM CHLORIDE 0.9% FLUSH
10.0000 mL | Freq: Once | INTRAVENOUS | Status: AC
  Administered 2024-05-12: 10 mL

## 2024-05-12 MED ORDER — DEXTROSE 5 % IV SOLN
70.0000 mg/m2 | Freq: Once | INTRAVENOUS | Status: AC
  Administered 2024-05-12: 120 mg via INTRAVENOUS
  Filled 2024-05-12: qty 60

## 2024-05-12 MED ORDER — SODIUM CHLORIDE 0.9 % IV SOLN: INTRAVENOUS | Status: DC

## 2024-05-12 MED ORDER — HEPARIN SOD (PORK) LOCK FLUSH 100 UNIT/ML IV SOLN
500.0000 [IU] | Freq: Once | INTRAVENOUS | Status: AC | PRN
  Administered 2024-05-12: 500 [IU]

## 2024-05-12 MED ORDER — SODIUM CHLORIDE 0.9% FLUSH: 10.0000 mL | Freq: Once | INTRAVENOUS | Status: DC

## 2024-05-12 MED ORDER — SODIUM CHLORIDE 0.9% FLUSH
10.0000 mL | INTRAVENOUS | Status: DC | PRN
  Administered 2024-05-12: 10 mL

## 2024-05-12 NOTE — Progress Notes (Signed)
 Patient states she took dexamethasone  prior to her appointment today.

## 2024-05-12 NOTE — Patient Instructions (Signed)
 [facility] - A DEPT OF [facility]  Discharge Instructions: Thank you for choosing [facility] to provide your oncology and hematology care.   If you have a lab appointment with the Cancer Center, please go directly to the Cancer Center and check in at the registration area.   Wear comfortable clothing and clothing appropriate for easy access to any Portacath or PICC line.   We strive to give you quality time with your provider. You may need to reschedule your appointment if you arrive late (15 or more minutes).  Arriving late affects you and other patients whose appointments are after yours.  Also, if you miss three or more appointments without notifying the office, you may be dismissed from the clinic at the provider's discretion.      For prescription refill requests, have your pharmacy contact our office and allow 72 hours for refills to be completed.    Today you received the following chemotherapy and/or immunotherapy agent: Carfilzomib (Kyprolis)      To help prevent nausea and vomiting after your treatment, we encourage you to take your nausea medication as directed.  BELOW ARE SYMPTOMS THAT SHOULD BE REPORTED IMMEDIATELY: *FEVER GREATER THAN 100.4 F (38 C) OR HIGHER *CHILLS OR SWEATING *NAUSEA AND VOMITING THAT IS NOT CONTROLLED WITH YOUR NAUSEA MEDICATION *UNUSUAL SHORTNESS OF BREATH *UNUSUAL BRUISING OR BLEEDING *URINARY PROBLEMS (pain or burning when urinating, or frequent urination) *BOWEL PROBLEMS (unusual diarrhea, constipation, pain near the anus) TENDERNESS IN MOUTH AND THROAT WITH OR WITHOUT PRESENCE OF ULCERS (sore throat, sores in mouth, or a toothache) UNUSUAL RASH, SWELLING OR PAIN  UNUSUAL VAGINAL DISCHARGE OR ITCHING   Items with * indicate a potential emergency and should be followed up as soon as possible or go to the Emergency Department if any problems should occur.  Please show the CHEMOTHERAPY ALERT CARD or  IMMUNOTHERAPY ALERT CARD at check-in to the Emergency Department and triage nurse.  Should you have questions after your visit or need to cancel or reschedule your appointment, please contact [facility] - [facility]  Dept: [phone]  and follow the prompts.  Office hours are 8:00 a.m. to 4:30 p.m. Monday - Friday. Please note that voicemails left after 4:00 p.m. may not be returned until the following business day.  We are closed weekends and major holidays. You have access to a nurse at all times for urgent questions. Please call the main number to the clinic Dept: [phone] and follow the prompts.   For any non-urgent questions, you may also contact your provider using MyChart. We now offer e-Visits for anyone 81 and older to request care online for non-urgent symptoms. For details visit [email or web address].   Also download the MyChart app! Go to the app store, search "MyChart", open the app, select [place], and log in with your MyChart username and password.

## 2024-05-19 ENCOUNTER — Inpatient Hospital Stay

## 2024-05-19 ENCOUNTER — Inpatient Hospital Stay: Attending: Hematology and Oncology

## 2024-05-19 ENCOUNTER — Inpatient Hospital Stay (HOSPITAL_BASED_OUTPATIENT_CLINIC_OR_DEPARTMENT_OTHER): Admitting: Hematology and Oncology

## 2024-05-19 VITALS — BP 134/65 | HR 82 | Temp 98.0°F | Resp 19 | Wt 143.1 lb

## 2024-05-19 DIAGNOSIS — Z95828 Presence of other vascular implants and grafts: Secondary | ICD-10-CM

## 2024-05-19 DIAGNOSIS — C9000 Multiple myeloma not having achieved remission: Secondary | ICD-10-CM | POA: Insufficient documentation

## 2024-05-19 DIAGNOSIS — Z79899 Other long term (current) drug therapy: Secondary | ICD-10-CM | POA: Insufficient documentation

## 2024-05-19 DIAGNOSIS — Z5112 Encounter for antineoplastic immunotherapy: Secondary | ICD-10-CM | POA: Diagnosis not present

## 2024-05-19 LAB — CMP (CANCER CENTER ONLY)
ALT: 13 U/L (ref 0–44)
AST: 13 U/L — ABNORMAL LOW (ref 15–41)
Albumin: 4 g/dL (ref 3.5–5.0)
Alkaline Phosphatase: 78 U/L (ref 38–126)
Anion gap: 5 (ref 5–15)
BUN: 23 mg/dL (ref 8–23)
CO2: 29 mmol/L (ref 22–32)
Calcium: 8.7 mg/dL — ABNORMAL LOW (ref 8.9–10.3)
Chloride: 108 mmol/L (ref 98–111)
Creatinine: 0.62 mg/dL (ref 0.44–1.00)
GFR, Estimated: >60 mL/min (ref >60)
Glucose, Bld: 110 mg/dL — ABNORMAL HIGH (ref 70–99)
Potassium: 4 mmol/L (ref 3.5–5.1)
Sodium: 142 mmol/L (ref 135–145)
Total Bilirubin: 1.4 mg/dL — ABNORMAL HIGH (ref 0.0–1.2)
Total Protein: 5.5 g/dL — ABNORMAL LOW (ref 6.5–8.1)

## 2024-05-19 LAB — CBC WITH DIFFERENTIAL (CANCER CENTER ONLY)
Abs Immature Granulocytes: 0.04 K/uL (ref 0.00–0.07)
Basophils Absolute: 0 K/uL (ref 0.0–0.1)
Basophils Relative: 0 %
Eosinophils Absolute: 0 K/uL (ref 0.0–0.5)
Eosinophils Relative: 0 %
HCT: 38.7 % (ref 36.0–46.0)
Hemoglobin: 13.1 g/dL (ref 12.0–15.0)
Immature Granulocytes: 0 %
Lymphocytes Relative: 7 %
Lymphs Abs: 0.7 K/uL (ref 0.7–4.0)
MCH: 32.5 pg (ref 26.0–34.0)
MCHC: 33.9 g/dL (ref 30.0–36.0)
MCV: 96 fL (ref 80.0–100.0)
Monocytes Absolute: 0.3 K/uL (ref 0.1–1.0)
Monocytes Relative: 3 %
Neutro Abs: 8.8 K/uL — ABNORMAL HIGH (ref 1.7–7.7)
Neutrophils Relative %: 90 %
Platelet Count: 144 K/uL — ABNORMAL LOW (ref 150–400)
RBC: 4.03 MIL/uL (ref 3.87–5.11)
RDW: 12.8 % (ref 11.5–15.5)
WBC Count: 9.8 K/uL (ref 4.0–10.5)
nRBC: 0 % (ref 0.0–0.2)

## 2024-05-19 MED ORDER — SODIUM CHLORIDE 0.9 % IV SOLN: Freq: Once | INTRAVENOUS | Status: AC

## 2024-05-19 MED ORDER — SODIUM CHLORIDE 0.9% FLUSH
10.0000 mL | INTRAVENOUS | Status: DC | PRN
Start: 2024-05-19 — End: 2024-05-19

## 2024-05-19 MED ORDER — SODIUM CHLORIDE 0.9% FLUSH
10.0000 mL | Freq: Once | INTRAVENOUS | Status: AC
  Administered 2024-05-19: 10 mL

## 2024-05-19 MED ORDER — DEXAMETHASONE 4 MG PO TABS: 40.0000 mg | ORAL_TABLET | ORAL | 5 refills | Status: DC

## 2024-05-19 MED ORDER — HYDROCODONE-ACETAMINOPHEN 5-325 MG PO TABS: 1.0000 | ORAL_TABLET | Freq: Four times a day (QID) | ORAL | 0 refills | Status: DC | PRN

## 2024-05-19 MED ORDER — DEXTROSE 5 % IV SOLN
70.0000 mg/m2 | Freq: Once | INTRAVENOUS | Status: AC
  Administered 2024-05-19: 120 mg via INTRAVENOUS
  Filled 2024-05-19: qty 60

## 2024-05-19 MED ORDER — LIDOCAINE-PRILOCAINE 2.5-2.5 % EX CREA: 1.0000 | TOPICAL_CREAM | CUTANEOUS | 1 refills | Status: AC | PRN

## 2024-05-19 NOTE — Progress Notes (Signed)
 Patient reports she took steroids at home today.

## 2024-05-19 NOTE — Patient Instructions (Signed)

## 2024-05-19 NOTE — Progress Notes (Signed)
 [facility] [phone]   [phone]  PROGRESS NOTE  Patient Care Team: Shayne Anes, MD as PCP - General (Internal Medicine)  Hematological/Oncological History # Free Lambda Multiple Myeloma 11/10/2022: WBC 5.68, Hgb 11.7, MCV 97.4, Plt 146 04/17/2023: WBC 4.53, Hgb 11.5, MCV 107.6, Plt 110 05/16/2023: establish care with Dr. Federico. Labs showed M protein 0.1 with Lambda 900, Kappa 3 with ratio 0.00.   06/01/2023: bone marrow biopsy performed, showed plasma cell myeloma nearly effacing approximately 80% of the cellular marrow.  06/25/2023: Cycle 1 Day 1 of VRD chemotherapy 07/23/2023: Cycle 2 Day 1 of VRD chemotherapy 08/13/2023: Cycle 3 Day 1 of VRD chemotherapy 09/03/2023: Cycle 1 Day 1 of Dara/Rev/Dex. 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 12/17/2023: Cycle 1 Day 1 of Kyprolis . Dara/Rev ineffective.   Interval History:  Brooke Whitaker 73 y.o. female with medical history significant for newly diagnosed multiple myeloma who presents for a follow up visit. The patient's last visit was on 05/05/2024. In the interim since the last visit she has had no major changes in her health.  On exam today Brooke Whitaker reports that she has been well overall in the interim since her last visit.  She does have some occasional nausea with treatment but no vomiting.  She reports she is taking her Compazine  to hydrocodone  at night.  Her appetite remains strong and her energy is pretty good after Mondays following her treatment.  She reports that she is able to keep up with her 72-year-old grandson.  She notes that she is not having any major side effects as a result of the Kyprolis .  She does get short of breath and winded pretty easily and her heart rate can get high.  She does not have any numbness or tingling of her fingers and toes.  She reports her bowels are moving well at this time.  Overall she is willing and able to proceed with Kyprolis  therapy at this time.  She  voices understanding that her proteins are rising but does not wish to add an Pomalyst yet.  We have also discussed bispecifics.  A full 10 point ROS is otherwise negative.   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    Sigmoid diverticulosis    Wears glasses     SURGICAL HISTORY: Past Surgical History:  Procedure Laterality Date   BREAST BIOPSY Right 09/2011   benign    COLONOSCOPY  2010   CYSTOSCOPY/RETROGRADE/URETEROSCOPY/STONE EXTRACTION WITH BASKET Left 04/21/2014   Procedure: cystoscopy, left retrograde pylegram, LEFT URETEROSCOPY, laser lithotripsy with holmium laser, STONE EXTRACTION, uretheral calibration;  Surgeon: Norleen JINNY Seltzer, MD;  Location: [facility];  Service: Urology;  Laterality: Left;   DILATION AND EVACUATION  1995   EPISIOTOMY REPAIR, SEVERE TEAR  1989   IR IMAGING GUIDED PORT INSERTION  02/04/2024   LAPAROSCOPIC OVARIAN CYSTECTOMY  10/ 2000   NM MYOCAR PERF WALL MOTION  12/15/2009   Normal   US  ECHOCARDIOGRAPHY  12/15/2009   Normal study for age: mild MR,TR and trace PI    SOCIAL HISTORY: Social History   Socioeconomic History   Marital status: Married    Spouse name: Not on file   Number of children: 2   Years of education: Not  on file   Highest education level: Not on file  Occupational History   Occupation: Print production planner  Tobacco Use   Smoking status: Never    Passive exposure: Never   Smokeless tobacco: Never  Vaping Use   Vaping status: Never Used  Substance and Sexual Activity   Alcohol  use: Yes    Alcohol /week: 2.0 standard drinks of alcohol     Types: 2 Glasses of wine per week   Drug use: No   Sexual activity: Not Currently    Partners: Male  Other Topics Concern   Not on file  Social History Narrative   Not on file   Social Drivers of  Health   Financial Resource Strain: Not on file  Food Insecurity: No Food Insecurity (05/16/2023)   Hunger Vital Sign    Worried About Running Out of Food in the Last Year: Never true    Ran Out of Food in the Last Year: Never true  Transportation Needs: No Transportation Needs (05/16/2023)   PRAPARE - Administrator, Civil Service (Medical): No    Lack of Transportation (Non-Medical): No  Physical Activity: Not on file  Stress: Not on file  Social Connections: Not on file  Intimate Partner Violence: Not At Risk (05/16/2023)   Humiliation, Afraid, Rape, and Kick questionnaire    Fear of Current or Ex-Partner: No    Emotionally Abused: No    Physically Abused: No    Sexually Abused: No    FAMILY HISTORY: Family History  Problem Relation Age of Onset   Diabetes Mother    Congestive Heart Failure Mother    Stroke Mother    Osteoporosis Mother    Other Mother        gallbladder ruptured   Heart attack Father    Diabetes Sister    Heart disease Sister    Heart disease Sister        x2   Hypertension Sister    Thyroid  disease Sister    Hypertension Sister    Diabetes Brother    Dementia Brother    Colon cancer Brother        mets to liver   Heart disease Brother        pacemaker   Stroke Brother    Liver cancer Brother    Heart disease Brother    Stomach cancer Neg Hx    Pancreatic cancer Neg Hx    Esophageal cancer Neg Hx     ALLERGIES:  is allergic to penicillins and sulfa antibiotics.  MEDICATIONS:  Current Outpatient Medications  Medication Sig Dispense Refill   acetaminophen  (TYLENOL ) 500 MG tablet Take 500 mg by mouth every 6 (six) hours as needed for mild pain or headache.      cholecalciferol (VITAMIN D ) 1000 UNITS tablet Take 1,000 Units by mouth every morning.      Cyanocobalamin  (VITAMIN B 12 PO) Take 1,000 mcg by mouth daily.     dexamethasone  (DECADRON ) 4 MG tablet Take 10 tablets (40 mg total) by mouth once a week. Take once weekly on the  day you receive the chemotherapy injection. 40 tablet 5   doxycycline (MONODOX) 100 MG capsule Take 100 mg by mouth 2 (two) times daily. For 7 days     Elderberry-Vitamin C-Zinc (ELDERBERRY IMMUNE HEALTH GUMMY PO) Take by mouth daily.     fluticasone (FLONASE) 50 MCG/ACT nasal spray Place into the nose as needed.     HYDROcodone -acetaminophen  (NORCO/VICODIN) 5-325 MG tablet Take 1 tablet by  mouth every 6 (six) hours as needed for moderate pain (pain score 4-6). 10 tablet 0   levocetirizine (XYZAL) 5 MG tablet Take 5 mg by mouth every evening.     lidocaine -prilocaine  (EMLA ) cream Apply 1 Application topically as needed. 30 g 1   multivitamin-lutein (OCUVITE-LUTEIN) CAPS capsule Take 1 capsule by mouth daily.     ondansetron  (ZOFRAN ) 8 MG tablet Take 1 tablet (8 mg total) by mouth every 8 (eight) hours as needed. 30 tablet 0   prochlorperazine  (COMPAZINE ) 10 MG tablet Take 1 tablet (10 mg total) by mouth every 6 (six) hours as needed for nausea or vomiting. 30 tablet 0   rosuvastatin (CRESTOR) 5 MG tablet Take 5 mg by mouth daily.     telmisartan (MICARDIS) 20 MG tablet Take 20 mg by mouth daily.     valACYclovir  (VALTREX ) 500 MG tablet Take 500 mg by mouth daily.     No current facility-administered medications for this visit.    REVIEW OF SYSTEMS:   Constitutional: ( - ) fevers, ( - )  chills , ( - ) night sweats Eyes: ( - ) blurriness of vision, ( - ) double vision, ( - ) watery eyes Ears, nose, mouth, throat, and face: ( - ) mucositis, ( - ) sore throat Respiratory: ( - ) cough, ( - ) dyspnea, ( - ) wheezes Cardiovascular: ( - ) palpitation, ( - ) chest discomfort, ( - ) lower extremity swelling Gastrointestinal:  ( - ) nausea, ( - ) heartburn, ( - ) change in bowel habits Skin: ( - ) abnormal skin rashes Lymphatics: ( - ) new lymphadenopathy, ( - ) easy bruising Neurological: ( - ) numbness, ( - ) tingling, ( - ) new weaknesses Behavioral/Psych: ( - ) mood change, ( - ) new changes   All other systems were reviewed with the patient and are negative.  PHYSICAL EXAMINATION: ECOG PERFORMANCE STATUS: 0 - Asymptomatic  Vitals:   05/19/24 1146  BP: 134/65  Pulse: 82  Resp: 19  Temp: 98 F (36.7 C)  SpO2: 98%   Filed Weights   05/19/24 1146  Weight: 143 lb 1.6 oz (64.9 kg)    GENERAL: Well-appearing elderly Caucasian female, alert, no distress and comfortable SKIN: skin color, texture, turgor are normal, no rashes or significant lesions EYES: conjunctiva are pink and non-injected, sclera clear LUNGS: clear to auscultation and percussion with normal breathing effort HEART: regular rate & rhythm and no murmurs and no lower extremity edema Musculoskeletal: no cyanosis of digits and no clubbing  PSYCH: alert & oriented x 3, fluent speech NEURO: no focal motor/sensory deficits  LABORATORY DATA:  I have reviewed the data as listed    Latest Ref Rng & Units 05/19/2024   11:24 AM 05/12/2024    2:16 PM 05/05/2024   11:12 AM  CBC  WBC 4.0 - 10.5 K/uL 9.8  6.2  8.0   Hemoglobin 12.0 - 15.0 g/dL 86.8  85.9  86.7   Hematocrit 36.0 - 46.0 % 38.7  41.8  40.1   Platelets 150 - 400 K/uL 144  127  186        Latest Ref Rng & Units 05/19/2024   11:24 AM 05/12/2024    2:16 PM 05/05/2024   11:12 AM  CMP  Glucose 70 - 99 mg/dL 889  815  865   BUN 8 - 23 mg/dL 23  16  17    Creatinine 0.44 - 1.00 mg/dL 9.37  9.30  9.38  Sodium 135 - 145 mmol/L 142  141  142   Potassium 3.5 - 5.1 mmol/L 4.0  4.0  4.1   Chloride 98 - 111 mmol/L 108  106  110   CO2 22 - 32 mmol/L 29  27  27    Calcium 8.9 - 10.3 mg/dL 8.7  8.9  8.6   Total Protein 6.5 - 8.1 g/dL 5.5  6.1  5.6   Total Bilirubin 0.0 - 1.2 mg/dL 1.4  1.5  1.3   Alkaline Phos 38 - 126 U/L 78  86  77   AST 15 - 41 U/L 13  13  15    ALT 0 - 44 U/L 13  15  13      Lab Results  Component Value Date   MPROTEIN Not Observed 05/05/2024   MPROTEIN Not Observed 04/07/2024   MPROTEIN 0.1 (H) 02/26/2024   Lab Results  Component  Value Date   KPAFRELGTCHN 1.4 (L) 05/05/2024   KPAFRELGTCHN 1.3 (L) 04/07/2024   KPAFRELGTCHN 1.2 (L) 02/26/2024   LAMBDASER 54.2 (H) 05/05/2024   LAMBDASER 50.2 (H) 04/07/2024   LAMBDASER 44.7 (H) 02/26/2024   KAPLAMBRATIO 0.03 (L) 05/05/2024   KAPLAMBRATIO 0.03 (L) 04/07/2024   KAPLAMBRATIO 0.03 (L) 02/26/2024    RADIOGRAPHIC STUDIES: No results found.   ASSESSMENT & PLAN Brooke Whitaker 73 y.o. female with medical history significant for newly diagnosed multiple myeloma who presents for a follow up visit.   Previously we discussed the diagnosis of multiple myeloma.  We discussed that this is a blood cancer that is not curable.  The goal is to get the patient into a durable remission with treatment.  We discussed the need for a minimum of 6 months of chemotherapy treatment then followed by maintenance therapy if the M protein has reached 0 and the free light chains are within normal limits.  We discussed expected side effects of the regimen and the plan moving forward.  The patient voiced her understanding of our findings, expected side effects, and risks/benefits.  She is okay with proceeding with chemotherapy at this time.  # Free Lambda Multiple Myeloma -- Diagnosis confirmed by the presence of a macrocytic anemia and 80% plasma cells in the bone marrow --Initially started with VRD chemotherapy with the patient developed rash as result of the Revlimid  and ocular toxicity from the Velcade .  Dropped the Revlimid  down to 10 mg p.o. daily and transition to Darzalex  for her next treatment. --UPEP showed marked Bence Jones proteins in the urine, no evidence of lytic lesions on metastatic survey PLAN: -- Continue Kyprolis /Dex therapy.  Today is Cycle 6 Day 15 --Labs today show white blood cell 9.8, hemoglobin 13.1, MCV 96, platelets 144 --On 02/26/2024 patient had kappa 1.4, lambda 54.2 ratio 0.03 with an M protein undetectable. -- Offer the addition of Pomalyst but the patient declined and  want to continue on Kyprolis  alone.  She is aware of the rising lambda chains. -- Plan for return to clinic in 2 weeks with interval continued Kyprolis  therapy.  #Supportive Care -- chemotherapy education complete -- port placed -- zofran  8mg  q8H PRN and compazine  10mg  PO q6H for nausea -- acyclovir  400mg  PO BID for VCZ prophylaxis -- EMLA  cream for port -- Zometa to be added after dental clearance.  -- no pain medication required at this time.    No orders of the defined types were placed in this encounter.   All questions were answered. The patient knows to call the clinic with any  problems, questions or concerns.  A total of more than 30 minutes were spent on this encounter with face-to-face time and non-face-to-face time, including preparing to see the patient, ordering tests and/or medications, counseling the patient and coordination of care as outlined above.   Norleen IVAR Kidney, MD Department of Hematology/Oncology [facility] Phone: [phone] Pager: [phone] Email: [email or web address]  05/19/2024 9:25 PM

## 2024-05-28 ENCOUNTER — Other Ambulatory Visit: Payer: Self-pay

## 2024-06-02 ENCOUNTER — Inpatient Hospital Stay

## 2024-06-02 ENCOUNTER — Inpatient Hospital Stay (HOSPITAL_BASED_OUTPATIENT_CLINIC_OR_DEPARTMENT_OTHER): Admitting: Hematology and Oncology

## 2024-06-02 VITALS — BP 148/68 | HR 91 | Temp 97.9°F | Resp 18 | Wt 143.5 lb

## 2024-06-02 DIAGNOSIS — C9000 Multiple myeloma not having achieved remission: Secondary | ICD-10-CM

## 2024-06-02 DIAGNOSIS — D539 Nutritional anemia, unspecified: Secondary | ICD-10-CM | POA: Diagnosis not present

## 2024-06-02 DIAGNOSIS — Z95828 Presence of other vascular implants and grafts: Secondary | ICD-10-CM | POA: Diagnosis not present

## 2024-06-02 DIAGNOSIS — Z5112 Encounter for antineoplastic immunotherapy: Secondary | ICD-10-CM | POA: Diagnosis not present

## 2024-06-02 LAB — CMP (CANCER CENTER ONLY)
ALT: 15 U/L (ref 0–44)
AST: 15 U/L (ref 15–41)
Albumin: 4.3 g/dL (ref 3.5–5.0)
Alkaline Phosphatase: 81 U/L (ref 38–126)
Anion gap: 8 (ref 5–15)
BUN: 16 mg/dL (ref 8–23)
CO2: 27 mmol/L (ref 22–32)
Calcium: 9.1 mg/dL (ref 8.9–10.3)
Chloride: 106 mmol/L (ref 98–111)
Creatinine: 0.89 mg/dL (ref 0.44–1.00)
GFR, Estimated: >60 mL/min (ref >60)
Glucose, Bld: 205 mg/dL — ABNORMAL HIGH (ref 70–99)
Potassium: 3.8 mmol/L (ref 3.5–5.1)
Sodium: 141 mmol/L (ref 135–145)
Total Bilirubin: 1.8 mg/dL — ABNORMAL HIGH (ref 0.0–1.2)
Total Protein: 5.9 g/dL — ABNORMAL LOW (ref 6.5–8.1)

## 2024-06-02 LAB — CBC WITH DIFFERENTIAL (CANCER CENTER ONLY)
Abs Immature Granulocytes: 0.06 K/uL (ref 0.00–0.07)
Basophils Absolute: 0 K/uL (ref 0.0–0.1)
Basophils Relative: 0 %
Eosinophils Absolute: 0 K/uL (ref 0.0–0.5)
Eosinophils Relative: 0 %
HCT: 40.7 % (ref 36.0–46.0)
Hemoglobin: 13.7 g/dL (ref 12.0–15.0)
Immature Granulocytes: 1 %
Lymphocytes Relative: 6 %
Lymphs Abs: 0.6 K/uL — ABNORMAL LOW (ref 0.7–4.0)
MCH: 32.6 pg (ref 26.0–34.0)
MCHC: 33.7 g/dL (ref 30.0–36.0)
MCV: 96.9 fL (ref 80.0–100.0)
Monocytes Absolute: 0.1 K/uL (ref 0.1–1.0)
Monocytes Relative: 1 %
Neutro Abs: 9.2 K/uL — ABNORMAL HIGH (ref 1.7–7.7)
Neutrophils Relative %: 92 %
Platelet Count: 212 K/uL (ref 150–400)
RBC: 4.2 MIL/uL (ref 3.87–5.11)
RDW: 12.9 % (ref 11.5–15.5)
WBC Count: 9.9 K/uL (ref 4.0–10.5)
nRBC: 0 % (ref 0.0–0.2)

## 2024-06-02 MED ORDER — SODIUM CHLORIDE 0.9 % IV SOLN: Freq: Once | INTRAVENOUS | Status: AC

## 2024-06-02 MED ORDER — SODIUM CHLORIDE 0.9% FLUSH
10.0000 mL | Freq: Once | INTRAVENOUS | Status: AC
  Administered 2024-06-02: 10 mL

## 2024-06-02 MED ORDER — SODIUM CHLORIDE 0.9% FLUSH: 10.0000 mL | INTRAVENOUS | Status: DC | PRN

## 2024-06-02 MED ORDER — DEXTROSE 5 % IV SOLN
70.0000 mg/m2 | Freq: Once | INTRAVENOUS | Status: AC
  Administered 2024-06-02: 120 mg via INTRAVENOUS
  Filled 2024-06-02: qty 60

## 2024-06-02 NOTE — Patient Instructions (Signed)
 [facility] - A DEPT OF [facility]  Discharge Instructions: Thank you for choosing [facility] to provide your oncology and hematology care.   If you have a lab appointment with the Cancer Center, please go directly to the Cancer Center and check in at the registration area.   Wear comfortable clothing and clothing appropriate for easy access to any Portacath or PICC line.   We strive to give you quality time with your provider. You may need to reschedule your appointment if you arrive late (15 or more minutes).  Arriving late affects you and other patients whose appointments are after yours.  Also, if you miss three or more appointments without notifying the office, you may be dismissed from the clinic at the provider's discretion.      For prescription refill requests, have your pharmacy contact our office and allow 72 hours for refills to be completed.    Today you received the following chemotherapy and/or immunotherapy agents kyprolis      To help prevent nausea and vomiting after your treatment, we encourage you to take your nausea medication as directed.  BELOW ARE SYMPTOMS THAT SHOULD BE REPORTED IMMEDIATELY: *FEVER GREATER THAN 100.4 F (38 C) OR HIGHER *CHILLS OR SWEATING *NAUSEA AND VOMITING THAT IS NOT CONTROLLED WITH YOUR NAUSEA MEDICATION *UNUSUAL SHORTNESS OF BREATH *UNUSUAL BRUISING OR BLEEDING *URINARY PROBLEMS (pain or burning when urinating, or frequent urination) *BOWEL PROBLEMS (unusual diarrhea, constipation, pain near the anus) TENDERNESS IN MOUTH AND THROAT WITH OR WITHOUT PRESENCE OF ULCERS (sore throat, sores in mouth, or a toothache) UNUSUAL RASH, SWELLING OR PAIN  UNUSUAL VAGINAL DISCHARGE OR ITCHING   Items with * indicate a potential emergency and should be followed up as soon as possible or go to the Emergency Department if any problems should occur.  Please show the CHEMOTHERAPY ALERT CARD or IMMUNOTHERAPY  ALERT CARD at check-in to the Emergency Department and triage nurse.  Should you have questions after your visit or need to cancel or reschedule your appointment, please contact [facility] - A DEPT OF [facility]  Dept: [phone]  and follow the prompts.  Office hours are 8:00 a.m. to 4:30 p.m. Monday - Friday. Please note that voicemails left after 4:00 p.m. may not be returned until the following business day.  We are closed weekends and major holidays. You have access to a nurse at all times for urgent questions. Please call the main number to the clinic Dept: [phone] and follow the prompts.   For any non-urgent questions, you may also contact your provider using MyChart. We now offer e-Visits for anyone 80 and older to request care online for non-urgent symptoms. For details visit [email or web address].   Also download the MyChart app! Go to the app store, search "MyChart", open the app, select [facility], and log in with your MyChart username and password.

## 2024-06-02 NOTE — Progress Notes (Signed)
 [facility] [phone]   [phone]  PROGRESS NOTE  [facility] Care Team: Shayne Anes, MD as PCP - General (Internal Medicine)  Hematological/Oncological History # Free Lambda Multiple Myeloma 11/10/2022: WBC 5.68, Hgb 11.7, MCV 97.4, Plt 146 04/17/2023: WBC 4.53, Hgb 11.5, MCV 107.6, Plt 110 05/16/2023: establish care with Dr. Federico. Labs showed M protein 0.1 with Lambda 900, Kappa 3 with ratio 0.00.   06/01/2023: bone marrow biopsy performed, showed plasma cell myeloma nearly effacing approximately 80% of Brooke cellular marrow.  06/25/2023: Cycle 1 Day 1 of VRD chemotherapy 07/23/2023: Cycle 2 Day 1 of VRD chemotherapy 08/13/2023: Cycle 3 Day 1 of VRD chemotherapy 09/03/2023: Cycle 1 Day 1 of Dara/Rev/Dex. 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 12/17/2023: Cycle 1 Day 1 of Kyprolis . Dara/Rev ineffective.   Interval History:  Brooke Whitaker 73 y.o. female with medical history significant for newly diagnosed multiple myeloma who presents for a follow up visit. Brooke Whitaker's last visit was on 05/19/2024. In Brooke interim since Brooke last visit she has had no major changes in her health.  On exam today Brooke Whitaker reports she had a great vacation and it was much needed.  She reports it was a great week and was warm and cloudy with only little bit of rain on Monday.  She reports that she has been feeling good and not too tired.  She notes her appetite is also good.  She thinks her taste issues are off.  She reports that she is taking Compazine  and hydrocodone  to try to help with Brooke headaches she gets from her steroid therapy.  She took Brooke 40 mg of p.o. Dex as prescribed this morning.  She reports that she otherwise has been at her baseline level of health with no fevers, chills, sweats, nausea, vomiting or diarrhea.  She denies any bone or back pain.  Full 10 point ROS is otherwise negative.   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    Sigmoid diverticulosis    Wears glasses     SURGICAL HISTORY: Past Surgical History:  Procedure Laterality Date   BREAST BIOPSY Right 09/2011   benign    COLONOSCOPY  2010   CYSTOSCOPY/RETROGRADE/URETEROSCOPY/STONE EXTRACTION WITH BASKET Left 04/21/2014   Procedure: cystoscopy, left retrograde pylegram, LEFT URETEROSCOPY, laser lithotripsy with holmium laser, STONE EXTRACTION, uretheral calibration;  Surgeon: Norleen JINNY Seltzer, MD;  Location: [facility];  Service: Urology;  Laterality: Left;   DILATION AND EVACUATION  1995   EPISIOTOMY REPAIR, SEVERE TEAR  1989   IR IMAGING GUIDED PORT INSERTION  02/04/2024   LAPAROSCOPIC OVARIAN CYSTECTOMY  10/ 2000   NM MYOCAR PERF WALL MOTION  12/15/2009   Normal   US  ECHOCARDIOGRAPHY  12/15/2009   Normal study for age: mild MR,TR and trace PI    SOCIAL HISTORY: Social History   Socioeconomic History   Marital status: Married    Spouse name: Not on file   Number of children: 2   Years of education: Not on file   Highest education level: Not on file  Occupational History   Occupation: Print production planner  Tobacco Use   Smoking status: Never    Passive exposure: Never   Smokeless tobacco: Never  Vaping Use  Vaping status: Never Used  Substance and Sexual Activity   Alcohol  use: Yes    Alcohol /week: 2.0 standard drinks of alcohol     Types: 2 Glasses of wine per week   Drug use: No   Sexual activity: Not Currently    Partners: Male  Other Topics Concern   Not on file  Social History Narrative   Not on file   Social Drivers of Health   Financial Resource Strain: Not on file  Food Insecurity: No Food Insecurity (05/16/2023)   Hunger Vital Sign    Worried About Running Out of Food in Brooke Last Year: Never true    Ran Out of Food in Brooke Last Year: Never true   Transportation Needs: No Transportation Needs (05/16/2023)   PRAPARE - Administrator, Civil Service (Medical): No    Lack of Transportation (Non-Medical): No  Physical Activity: Not on file  Stress: Not on file  Social Connections: Not on file  Intimate Partner Violence: Not At Risk (05/16/2023)   Humiliation, Afraid, Rape, and Kick questionnaire    Fear of Current or Ex-Partner: No    Emotionally Abused: No    Physically Abused: No    Sexually Abused: No    FAMILY HISTORY: Family History  Problem Relation Age of Onset   Diabetes Mother    Congestive Heart Failure Mother    Stroke Mother    Osteoporosis Mother    Other Mother        gallbladder ruptured   Heart attack Father    Diabetes Sister    Heart disease Sister    Heart disease Sister        x2   Hypertension Sister    Thyroid  disease Sister    Hypertension Sister    Diabetes Brother    Dementia Brother    Colon cancer Brother        mets to liver   Heart disease Brother        pacemaker   Stroke Brother    Liver cancer Brother    Heart disease Brother    Stomach cancer Neg Hx    Pancreatic cancer Neg Hx    Esophageal cancer Neg Hx     ALLERGIES:  is allergic to penicillins and sulfa antibiotics.  MEDICATIONS:  Current Outpatient Medications  Medication Sig Dispense Refill   acetaminophen  (TYLENOL ) 500 MG tablet Take 500 mg by mouth every 6 (six) hours as needed for mild pain or headache.      cholecalciferol (VITAMIN D ) 1000 UNITS tablet Take 1,000 Units by mouth every morning.      Cyanocobalamin  (VITAMIN B 12 PO) Take 1,000 mcg by mouth daily.     dexamethasone  (DECADRON ) 4 MG tablet Take 10 tablets (40 mg total) by mouth once a week. Take once weekly on Brooke day you receive Brooke chemotherapy injection. 40 tablet 5   doxycycline (MONODOX) 100 MG capsule Take 100 mg by mouth 2 (two) times daily. For 7 days     Elderberry-Vitamin C-Zinc (ELDERBERRY IMMUNE HEALTH GUMMY PO) Take by mouth daily.      fluticasone (FLONASE) 50 MCG/ACT nasal spray Place into Brooke nose as needed.     HYDROcodone -acetaminophen  (NORCO/VICODIN) 5-325 MG tablet Take 1 tablet by mouth every 6 (six) hours as needed for moderate pain (pain score 4-6). 10 tablet 0   levocetirizine (XYZAL) 5 MG tablet Take 5 mg by mouth every evening.     lidocaine -prilocaine  (EMLA ) cream Apply 1 Application topically as  needed. 30 g 1   multivitamin-lutein (OCUVITE-LUTEIN) CAPS capsule Take 1 capsule by mouth daily.     ondansetron  (ZOFRAN ) 8 MG tablet Take 1 tablet (8 mg total) by mouth every 8 (eight) hours as needed. 30 tablet 0   prochlorperazine  (COMPAZINE ) 10 MG tablet Take 1 tablet (10 mg total) by mouth every 6 (six) hours as needed for nausea or vomiting. 30 tablet 0   rosuvastatin (CRESTOR) 5 MG tablet Take 5 mg by mouth daily.     telmisartan (MICARDIS) 20 MG tablet Take 20 mg by mouth daily.     valACYclovir  (VALTREX ) 500 MG tablet Take 500 mg by mouth daily.     No current facility-administered medications for this visit.   Facility-Administered Medications Ordered in Other Visits  Medication Dose Route Frequency Provider Last Rate Last Admin   carfilzomib  (KYPROLIS ) 120 mg in dextrose  5 % 100 mL chemo infusion  70 mg/m2 (Treatment Plan Recorded) Intravenous Once Federico Norleen DASEN IV, MD 320 mL/hr at 06/02/24 1547 120 mg at 06/02/24 1547   sodium chloride  flush (NS) 0.9 % injection 10 mL  10 mL Intracatheter PRN Federico Norleen DASEN MADISON, MD        REVIEW OF SYSTEMS:   Constitutional: ( - ) fevers, ( - )  chills , ( - ) night sweats Eyes: ( - ) blurriness of vision, ( - ) double vision, ( - ) watery eyes Ears, nose, mouth, throat, and face: ( - ) mucositis, ( - ) sore throat Respiratory: ( - ) cough, ( - ) dyspnea, ( - ) wheezes Cardiovascular: ( - ) palpitation, ( - ) chest discomfort, ( - ) lower extremity swelling Gastrointestinal:  ( - ) nausea, ( - ) heartburn, ( - ) change in bowel habits Skin: ( - ) abnormal skin  rashes Lymphatics: ( - ) new lymphadenopathy, ( - ) easy bruising Neurological: ( - ) numbness, ( - ) tingling, ( - ) new weaknesses Behavioral/Psych: ( - ) mood change, ( - ) new changes  All other systems were reviewed with Brooke Whitaker and are negative.  PHYSICAL EXAMINATION: ECOG PERFORMANCE STATUS: 0 - Asymptomatic  There were no vitals filed for this visit.  There were no vitals filed for this visit.   GENERAL: Well-appearing elderly Caucasian female, alert, no distress and comfortable SKIN: skin color, texture, turgor are normal, no rashes or significant lesions EYES: conjunctiva are pink and non-injected, sclera clear LUNGS: clear to auscultation and percussion with normal breathing effort HEART: regular rate & rhythm and no murmurs and no lower extremity edema Musculoskeletal: no cyanosis of digits and no clubbing  PSYCH: alert & oriented x 3, fluent speech NEURO: no focal motor/sensory deficits  LABORATORY DATA:  I have reviewed Brooke data as listed    Latest Ref Rng & Units 06/02/2024    1:56 PM 05/19/2024   11:24 AM 05/12/2024    2:16 PM  CBC  WBC 4.0 - 10.5 K/uL 9.9  9.8  6.2   Hemoglobin 12.0 - 15.0 g/dL 86.2  86.8  85.9   Hematocrit 36.0 - 46.0 % 40.7  38.7  41.8   Platelets 150 - 400 K/uL 212  144  127        Latest Ref Rng & Units 06/02/2024    1:56 PM 05/19/2024   11:24 AM 05/12/2024    2:16 PM  CMP  Glucose 70 - 99 mg/dL 794  889  815   BUN 8 - 23 mg/dL  16  23  16    Creatinine 0.44 - 1.00 mg/dL 9.10  9.37  9.30   Sodium 135 - 145 mmol/L 141  142  141   Potassium 3.5 - 5.1 mmol/L 3.8  4.0  4.0   Chloride 98 - 111 mmol/L 106  108  106   CO2 22 - 32 mmol/L 27  29  27    Calcium 8.9 - 10.3 mg/dL 9.1  8.7  8.9   Total Protein 6.5 - 8.1 g/dL 5.9  5.5  6.1   Total Bilirubin 0.0 - 1.2 mg/dL 1.8  1.4  1.5   Alkaline Phos 38 - 126 U/L 81  78  86   AST 15 - 41 U/L 15  13  13    ALT 0 - 44 U/L 15  13  15      Lab Results  Component Value Date   MPROTEIN Not  Observed 05/05/2024   MPROTEIN Not Observed 04/07/2024   MPROTEIN 0.1 (H) 02/26/2024   Lab Results  Component Value Date   KPAFRELGTCHN 1.4 (L) 05/05/2024   KPAFRELGTCHN 1.3 (L) 04/07/2024   KPAFRELGTCHN 1.2 (L) 02/26/2024   LAMBDASER 54.2 (H) 05/05/2024   LAMBDASER 50.2 (H) 04/07/2024   LAMBDASER 44.7 (H) 02/26/2024   KAPLAMBRATIO 0.03 (L) 05/05/2024   KAPLAMBRATIO 0.03 (L) 04/07/2024   KAPLAMBRATIO 0.03 (L) 02/26/2024    RADIOGRAPHIC STUDIES: No results found.   ASSESSMENT & PLAN JALYNN WADDELL 73 y.o. female with medical history significant for newly diagnosed multiple myeloma who presents for a follow up visit.   Previously we discussed Brooke diagnosis of multiple myeloma.  We discussed that this is a blood cancer that is not curable.  Brooke goal is to get Brooke Whitaker into a durable remission with treatment.  We discussed Brooke need for a minimum of 6 months of chemotherapy treatment then followed by maintenance therapy if Brooke M protein has reached 0 and Brooke free light chains are within normal limits.  We discussed expected side effects of Brooke regimen and Brooke plan moving forward.  Brooke Whitaker voiced her understanding of our findings, expected side effects, and risks/benefits.  She is okay with proceeding with chemotherapy at this time.  # Free Lambda Multiple Myeloma -- Diagnosis confirmed by Brooke presence of a macrocytic anemia and 80% plasma cells in Brooke bone marrow --Initially started with VRD chemotherapy with Brooke Whitaker developed rash as result of Brooke Revlimid  and ocular toxicity from Brooke Velcade .  Dropped Brooke Revlimid  down to 10 mg p.o. daily and transition to Darzalex  for her next treatment. --UPEP showed marked Bence Jones proteins in Brooke urine, no evidence of lytic lesions on metastatic survey PLAN: -- Continue Kyprolis /Dex therapy.  Today is Cycle 7 Day 1 --Labs today show white blood cell 9.9, hemoglobin 13.7, MCV 96.9, platelets 212.  LFTs and creatinine within normal  limits.  --On 05/05/2024 Whitaker had kappa 1.4, lambda 54.2 ratio 0.03 with an M protein undetectable. -- Offer Brooke addition of Pomalyst but Brooke Whitaker declined and want to continue on Kyprolis  alone.  She is aware of Brooke rising lambda chains. -- Plan for return to clinic in 2 weeks with interval continued Kyprolis  therapy.  #Supportive Care -- chemotherapy education complete -- port placed -- zofran  8mg  q8H PRN and compazine  10mg  PO q6H for nausea -- acyclovir  400mg  PO BID for VCZ prophylaxis -- EMLA  cream for port -- Zometa to be added after dental clearance.  -- no pain medication required at this time.  Orders Placed This Encounter  Procedures   Multiple Myeloma Panel (SPEP&IFE w/QIG)    Standing Status:   Future    Expected Date:   06/30/2024    Expiration Date:   06/30/2025   Kappa/lambda light chains    Standing Status:   Future    Expected Date:   06/30/2024    Expiration Date:   06/30/2025   CBC with Differential (Cancer Center Only)    Standing Status:   Future    Expected Date:   06/30/2024    Expiration Date:   06/30/2025   CMP (Cancer Center only)    Standing Status:   Future    Expected Date:   06/30/2024    Expiration Date:   06/30/2025   CBC with Differential (Cancer Center Only)    Standing Status:   Future    Expected Date:   07/07/2024    Expiration Date:   07/07/2025   CMP (Cancer Center only)    Standing Status:   Future    Expected Date:   07/07/2024    Expiration Date:   07/07/2025   CBC with Differential (Cancer Center Only)    Standing Status:   Future    Expected Date:   07/14/2024    Expiration Date:   07/14/2025   CMP (Cancer Center only)    Standing Status:   Future    Expected Date:   07/14/2024    Expiration Date:   07/14/2025   Multiple Myeloma Panel (SPEP&IFE w/QIG)    Standing Status:   Future    Expected Date:   07/28/2024    Expiration Date:   07/28/2025   Kappa/lambda light chains    Standing Status:   Future    Expected Date:   07/28/2024     Expiration Date:   07/28/2025   CBC with Differential (Cancer Center Only)    Standing Status:   Future    Expected Date:   07/28/2024    Expiration Date:   07/28/2025   CMP (Cancer Center only)    Standing Status:   Future    Expected Date:   07/28/2024    Expiration Date:   07/28/2025   CBC with Differential (Cancer Center Only)    Standing Status:   Future    Expected Date:   08/04/2024    Expiration Date:   08/04/2025   CMP (Cancer Center only)    Standing Status:   Future    Expected Date:   08/04/2024    Expiration Date:   08/04/2025   CBC with Differential (Cancer Center Only)    Standing Status:   Future    Expected Date:   08/11/2024    Expiration Date:   08/11/2025   CMP (Cancer Center only)    Standing Status:   Future    Expected Date:   08/11/2024    Expiration Date:   08/11/2025    All questions were answered. Brooke Whitaker knows to call Brooke clinic with any problems, questions or concerns.  A total of more than 30 minutes were spent on this encounter with face-to-face time and non-face-to-face time, including preparing to see Brooke Whitaker, ordering tests and/or medications, counseling Brooke Whitaker and coordination of care as outlined above.   Norleen IVAR Kidney, MD Department of Hematology/Oncology [facility] Phone: [phone] Pager: [phone] Email: [email or web address]  06/02/2024 4:05 PM

## 2024-06-03 LAB — KAPPA/LAMBDA LIGHT CHAINS
Kappa free light chain: 1.3 mg/L — ABNORMAL LOW (ref 3.3–19.4)
Kappa, lambda light chain ratio: 0.03 — ABNORMAL LOW (ref 0.26–1.65)
Lambda free light chains: 46.3 mg/L — ABNORMAL HIGH (ref 5.7–26.3)

## 2024-06-05 ENCOUNTER — Other Ambulatory Visit: Payer: Self-pay

## 2024-06-05 LAB — MULTIPLE MYELOMA PANEL, SERUM
Albumin SerPl Elph-Mcnc: 3.8 g/dL (ref 2.9–4.4)
Albumin/Glob SerPl: 2.1 — ABNORMAL HIGH (ref 0.7–1.7)
Alpha 1: 0.2 g/dL (ref 0.0–0.4)
Alpha2 Glob SerPl Elph-Mcnc: 0.7 g/dL (ref 0.4–1.0)
B-Globulin SerPl Elph-Mcnc: 0.8 g/dL (ref 0.7–1.3)
Gamma Glob SerPl Elph-Mcnc: 0.2 g/dL — ABNORMAL LOW (ref 0.4–1.8)
Globulin, Total: 1.9 g/dL — ABNORMAL LOW (ref 2.2–3.9)
IgA: <5 mg/dL — ABNORMAL LOW (ref 64–422)
IgG (Immunoglobin G), Serum: 167 mg/dL — ABNORMAL LOW (ref 586–1602)
IgM (Immunoglobulin M), Srm: <5 mg/dL — ABNORMAL LOW (ref 26–217)
Total Protein ELP: 5.7 g/dL — ABNORMAL LOW (ref 6.0–8.5)

## 2024-06-06 ENCOUNTER — Emergency Department (HOSPITAL_COMMUNITY)

## 2024-06-06 ENCOUNTER — Encounter: Payer: Self-pay | Admitting: Hematology and Oncology

## 2024-06-06 ENCOUNTER — Emergency Department (HOSPITAL_COMMUNITY)
Admission: EM | Admit: 2024-06-06 | Discharge: 2024-06-07 | Disposition: A | Discharge status: Home / Self Care | Attending: Emergency Medicine | Admitting: Emergency Medicine

## 2024-06-06 ENCOUNTER — Other Ambulatory Visit: Payer: Self-pay

## 2024-06-06 DIAGNOSIS — R519 Headache, unspecified: Secondary | ICD-10-CM | POA: Diagnosis not present

## 2024-06-06 DIAGNOSIS — I7774 Dissection of vertebral artery: Secondary | ICD-10-CM | POA: Diagnosis not present

## 2024-06-06 DIAGNOSIS — R110 Nausea: Secondary | ICD-10-CM | POA: Diagnosis not present

## 2024-06-06 DIAGNOSIS — I10 Essential (primary) hypertension: Secondary | ICD-10-CM | POA: Diagnosis not present

## 2024-06-06 DIAGNOSIS — Z79899 Other long term (current) drug therapy: Secondary | ICD-10-CM | POA: Insufficient documentation

## 2024-06-06 DIAGNOSIS — E042 Nontoxic multinodular goiter: Secondary | ICD-10-CM | POA: Diagnosis not present

## 2024-06-06 DIAGNOSIS — R030 Elevated blood-pressure reading, without diagnosis of hypertension: Secondary | ICD-10-CM

## 2024-06-06 DIAGNOSIS — D696 Thrombocytopenia, unspecified: Secondary | ICD-10-CM | POA: Insufficient documentation

## 2024-06-06 DIAGNOSIS — I700 Atherosclerosis of aorta: Secondary | ICD-10-CM | POA: Diagnosis not present

## 2024-06-06 DIAGNOSIS — G4489 Other headache syndrome: Secondary | ICD-10-CM | POA: Diagnosis not present

## 2024-06-06 DIAGNOSIS — C9000 Multiple myeloma not having achieved remission: Secondary | ICD-10-CM | POA: Diagnosis not present

## 2024-06-06 LAB — COMPREHENSIVE METABOLIC PANEL WITH GFR
ALT: 18 U/L (ref 0–44)
AST: 19 U/L (ref 15–41)
Albumin: 3.7 g/dL (ref 3.5–5.0)
Alkaline Phosphatase: 73 U/L (ref 38–126)
Anion gap: 10 (ref 5–15)
BUN: 13 mg/dL (ref 8–23)
CO2: 24 mmol/L (ref 22–32)
Calcium: 9.2 mg/dL (ref 8.9–10.3)
Chloride: 107 mmol/L (ref 98–111)
Creatinine, Ser: 0.7 mg/dL (ref 0.44–1.00)
GFR, Estimated: >60 mL/min (ref >60)
Glucose, Bld: 94 mg/dL (ref 70–99)
Potassium: 3.6 mmol/L (ref 3.5–5.1)
Sodium: 141 mmol/L (ref 135–145)
Total Bilirubin: 1.7 mg/dL — ABNORMAL HIGH (ref 0.0–1.2)
Total Protein: 5.7 g/dL — ABNORMAL LOW (ref 6.5–8.1)

## 2024-06-06 LAB — CBC WITH DIFFERENTIAL/PLATELET
Abs Granulocyte: 4.4 K/uL (ref 1.5–6.5)
Abs Immature Granulocytes: 0.05 K/uL (ref 0.00–0.07)
Basophils Absolute: 0 K/uL (ref 0.0–0.1)
Basophils Relative: 0 %
Eosinophils Absolute: 0.2 K/uL (ref 0.0–0.5)
Eosinophils Relative: 2 %
HCT: 40.7 % (ref 36.0–46.0)
Hemoglobin: 13.4 g/dL (ref 12.0–15.0)
Immature Granulocytes: 1 %
Lymphocytes Relative: 16 %
Lymphs Abs: 1 K/uL (ref 0.7–4.0)
MCH: 32.3 pg (ref 26.0–34.0)
MCHC: 32.9 g/dL (ref 30.0–36.0)
MCV: 98.1 fL (ref 80.0–100.0)
Monocytes Absolute: 0.6 K/uL (ref 0.1–1.0)
Monocytes Relative: 9 %
Neutro Abs: 4.4 K/uL (ref 1.7–7.7)
Neutrophils Relative %: 72 %
Platelets: 91 K/uL — ABNORMAL LOW (ref 150–400)
RBC: 4.15 MIL/uL (ref 3.87–5.11)
RDW: 12.8 % (ref 11.5–15.5)
WBC: 6.2 K/uL (ref 4.0–10.5)
nRBC: 0.3 % — ABNORMAL HIGH (ref 0.0–0.2)

## 2024-06-06 LAB — TSH: TSH: 2.876 u[IU]/mL (ref 0.350–4.500)

## 2024-06-06 MED ORDER — MORPHINE SULFATE (PF) 4 MG/ML IV SOLN
4.0000 mg | Freq: Once | INTRAVENOUS | Status: DC
  Filled 2024-06-06: qty 1

## 2024-06-06 MED ORDER — HYDRALAZINE HCL 20 MG/ML IJ SOLN
10.0000 mg | Freq: Once | INTRAMUSCULAR | Status: AC
  Administered 2024-06-06: 10 mg via INTRAVENOUS
  Filled 2024-06-06: qty 1

## 2024-06-06 MED ORDER — PROCHLORPERAZINE EDISYLATE 10 MG/2ML IJ SOLN
10.0000 mg | Freq: Once | INTRAMUSCULAR | Status: AC
  Administered 2024-06-06: 10 mg via INTRAVENOUS
  Filled 2024-06-06: qty 2

## 2024-06-06 MED ORDER — DIPHENHYDRAMINE HCL 50 MG/ML IJ SOLN
50.0000 mg | Freq: Once | INTRAMUSCULAR | Status: AC
  Administered 2024-06-06: 50 mg via INTRAVENOUS
  Filled 2024-06-06: qty 1

## 2024-06-06 MED ORDER — HYDRALAZINE HCL 10 MG PO TABS: 10.0000 mg | ORAL_TABLET | Freq: Three times a day (TID) | ORAL | 0 refills | Status: DC | PRN

## 2024-06-06 MED ORDER — HYDRALAZINE HCL 20 MG/ML IJ SOLN: 10.0000 mg | Freq: Once | INTRAMUSCULAR | Status: DC

## 2024-06-06 MED ORDER — IOHEXOL 350 MG/ML SOLN
75.0000 mL | Freq: Once | INTRAVENOUS | Status: AC | PRN
  Administered 2024-06-06: 75 mL via INTRAVENOUS

## 2024-06-06 NOTE — ED Provider Notes (Signed)
 Care assumed 2300. Patient here for evaluation of headache, elevated blood pressure. Blood pressure improved after receiving hydralazine . CBC with thrombocytopenia. Care assumed pending CTA and CTV.  Imaging is negative for acute abnormality, patient remains pain free. Plan to discharge with outpatient follow-up and return precautions.   Griselda Norris, MD 06/07/24 [phone]

## 2024-06-06 NOTE — ED Provider Notes (Addendum)
 [place] EMERGENCY DEPARTMENT AT [facility] Provider Note   CSN: [id number] Arrival date & time: 06/06/24  1910     Patient presents with: No chief complaint on file.   Brooke Whitaker is a 73 y.o. female.   The history is provided by the patient and medical records. No language interpreter was used.  Headache Pain location:  Generalized Quality:  Dull Radiates to:  L neck Severity currently:  10/10 Severity at highest:  10/10 Onset quality:  Gradual Duration:  2 days Timing:  Constant Progression:  Worsening Chronicity:  New Similar to prior headaches: yes   Context: bright light   Relieved by:  Nothing Worsened by:  Nothing Ineffective treatments:  None tried Associated symptoms: diarrhea, nausea, paresthesias (r hand mildly, resolved) and photophobia   Associated symptoms: no abdominal pain, no back pain, no blurred vision, no congestion, no cough, no dizziness, no eye pain, no fatigue, no fever, no focal weakness, no loss of balance, no myalgias, no near-syncope, no neck pain, no neck stiffness, no numbness, no seizures, no URI, no visual change, no vomiting and no weakness        Prior to Admission medications   Medication Sig Start Date End Date Taking? Authorizing Provider  acetaminophen  (TYLENOL ) 500 MG tablet Take 500 mg by mouth every 6 (six) hours as needed for mild pain or headache.     [provider]  cholecalciferol (VITAMIN D ) 1000 UNITS tablet Take 1,000 Units by mouth every morning.     [provider]  Cyanocobalamin  (VITAMIN B 12 PO) Take 1,000 mcg by mouth daily.    [provider]  dexamethasone  (DECADRON ) 4 MG tablet Take 10 tablets (40 mg total) by mouth once a week. Take once weekly on the day you receive the chemotherapy injection. 05/19/24   Dorsey, John T IV, MD  doxycycline (MONODOX) 100 MG capsule Take 100 mg by mouth 2 (two) times daily. For 7 days 01/25/24   [provider]  Elderberry-Vitamin  C-Zinc (ELDERBERRY IMMUNE HEALTH GUMMY PO) Take by mouth daily.    [provider]  fluticasone (FLONASE) 50 MCG/ACT nasal spray Place into the nose as needed.    [provider]  HYDROcodone -acetaminophen  (NORCO/VICODIN) 5-325 MG tablet Take 1 tablet by mouth every 6 (six) hours as needed for moderate pain (pain score 4-6). 05/19/24   Dorsey, John T IV, MD  levocetirizine (XYZAL) 5 MG tablet Take 5 mg by mouth every evening.    [provider]  lidocaine -prilocaine  (EMLA ) cream Apply 1 Application topically as needed. 05/19/24   Dorsey, John T IV, MD  multivitamin-lutein (OCUVITE-LUTEIN) CAPS capsule Take 1 capsule by mouth daily.    [provider]  ondansetron  (ZOFRAN ) 8 MG tablet Take 1 tablet (8 mg total) by mouth every 8 (eight) hours as needed. 06/11/23   Federico Norleen ONEIDA MADISON, MD  prochlorperazine  (COMPAZINE ) 10 MG tablet Take 1 tablet (10 mg total) by mouth every 6 (six) hours as needed for nausea or vomiting. 06/11/23   Dorsey, John T IV, MD  rosuvastatin (CRESTOR) 5 MG tablet Take 5 mg by mouth daily. 04/24/19   [provider]  telmisartan (MICARDIS) 20 MG tablet Take 20 mg by mouth daily. 02/21/24   [provider]  valACYclovir  (VALTREX ) 500 MG tablet Take 500 mg by mouth daily. 10/30/18   [provider]    Allergies: Penicillins and Sulfa antibiotics    Review of Systems  Constitutional:  Negative for chills,  fatigue and fever.  HENT:  Negative for congestion.   Eyes:  Positive for photophobia. Negative for blurred vision and pain.  Respiratory:  Negative for cough, chest tightness, shortness of breath and wheezing.   Cardiovascular:  Negative for chest pain, palpitations and near-syncope.  Gastrointestinal:  Positive for diarrhea and nausea. Negative for abdominal distention, abdominal pain and vomiting.  Genitourinary:  Negative for dysuria and flank pain.  Musculoskeletal:  Negative for back pain, myalgias, neck pain and neck  stiffness.  Skin:  Negative for rash and wound.  Neurological:  Positive for headaches and paresthesias (r hand mildly, resolved). Negative for dizziness, tremors, focal weakness, seizures, syncope, facial asymmetry, weakness, light-headedness, numbness and loss of balance.  Psychiatric/Behavioral:  Negative for agitation and confusion.   All other systems reviewed and are negative.   Updated Vital Signs BP (!) 205/88   Pulse 63   Temp 97.6 F (36.4 C) (Oral)   Resp 13   LMP 10/16/2001   SpO2 100%   Physical Exam Vitals and nursing note reviewed.  Constitutional:      General: She is not in acute distress.    Appearance: She is well-developed. She is not ill-appearing, toxic-appearing or diaphoretic.  HENT:     Head: Normocephalic and atraumatic.     Nose: No congestion or rhinorrhea.     Mouth/Throat:     Mouth: Mucous membranes are moist.     Pharynx: No oropharyngeal exudate or posterior oropharyngeal erythema.  Eyes:     Extraocular Movements: Extraocular movements intact.     Conjunctiva/sclera: Conjunctivae normal.     Pupils: Pupils are equal, round, and reactive to light.  Neck:     Vascular: No carotid bruit.  Cardiovascular:     Rate and Rhythm: Normal rate and regular rhythm.     Pulses: Normal pulses.     Heart sounds: No murmur heard. Pulmonary:     Effort: Pulmonary effort is normal. No respiratory distress.     Breath sounds: Normal breath sounds. No wheezing, rhonchi or rales.  Chest:     Chest wall: No tenderness.  Abdominal:     General: Abdomen is flat.     Palpations: Abdomen is soft.     Tenderness: There is no abdominal tenderness. There is no right CVA tenderness, left CVA tenderness, guarding or rebound.  Musculoskeletal:        General: No swelling or tenderness.     Cervical back: Neck supple. No tenderness.     Right lower leg: No edema.     Left lower leg: No edema.  Skin:    General: Skin is warm and dry.     Capillary Refill:  Capillary refill takes less than 2 seconds.     Findings: No erythema or rash.  Neurological:     General: No focal deficit present.     Mental Status: She is alert. Mental status is at baseline.     Cranial Nerves: No cranial nerve deficit.     Sensory: No sensory deficit.     Motor: No weakness.     Coordination: Coordination normal.  Psychiatric:        Mood and Affect: Mood normal.     (all labs ordered are listed, but only abnormal results are displayed) Labs Reviewed  CBC WITH DIFFERENTIAL/PLATELET - Abnormal; Notable for the following components:      Result Value   Platelets 91 (*)    nRBC 0.3 (*)    All other components  within normal limits  COMPREHENSIVE METABOLIC PANEL WITH GFR - Abnormal; Notable for the following components:   Total Protein 5.7 (*)    Total Bilirubin 1.7 (*)    All other components within normal limits  TSH    EKG: None  Radiology: No results found.   Procedures   Medications Ordered in the ED  morphine  (PF) 4 MG/ML injection 4 mg (has no administration in time range)  prochlorperazine  (COMPAZINE ) injection 10 mg (10 mg Intravenous Given 06/06/24 1958)  diphenhydrAMINE  (BENADRYL ) injection 50 mg (50 mg Intravenous Given 06/06/24 1959)  hydrALAZINE  (APRESOLINE ) injection 10 mg (10 mg Intravenous Given 06/06/24 1959)  iohexol  (OMNIPAQUE ) 350 MG/ML injection 75 mL (75 mLs Intravenous Contrast Given 06/06/24 2122)                                    Medical Decision Making Amount and/or Complexity of Data Reviewed Labs: ordered. Radiology: ordered.  Risk Prescription drug management.    Brooke Whitaker is a 73 y.o. female with a past medical history significant hyperlipidemia, hypertension, diverticulosis, kidney stones, and multiple myeloma on chemotherapy who presents with severe headache.  According to patient, she had her chemotherapy on Monday and normally gets mild headache but then goes away.  She reports that since yesterday has  been worsening and is now 10 out of 10.  She reports the pain is in the top part of her neck and goes into her head.  She had some nausea and some diarrhea today but otherwise no fevers chills or cough.  She reports no vision changes but reports her blood pressure has been going higher and higher.  It is over 200 upon arrival here.  She reports some tingling in her right hand but denies any focal numbness or weakness.  No leg symptoms.  No vision changes or speech changes.  No dizziness.  Patient says that she called her team and told her to come to the emergency department for evaluation and imaging given her history of multiple myeloma, vital signs and her headache worsening.  On my exam, lungs were clear and chest nontender.  Abdomen nontender.  Neck not focally tender.  No carotid bruit.  Pupils symmetric and reactive with normal extraocular movements.  Symmetric smile.  Clear speech.  Normal finger-nose-finger testing bilaterally.  Normal sensation strength and pulses in extremities.  Physical exam otherwise reassuring.  Given her heart rate being in the 60s and blood pressure over 200, will give some hydralazine  to see if this helps her blood pressure initially.  With her history of multi myeloma, elevated blood pressures, and where her pain is located, we will get CT of the head and neck to look for dissection aneurysm or bleed and then will get CT venogram to look for dural venous sinus thrombus given her clotting risk.  Will give headache cocktail.  Anticipate reassessment after workup to determine disposition.  9:53 PM Blood pressure has improved down to the 170s but she is still having significant headache.  Still waiting her results of her imaging.  Will give some pain medicine now to see if symptoms help.  10:50 PM Headache is much improved and blood pressure is now into the 130s.  She is feeling much better.  If the CTs are reassuring she would like to go home.  She is interested in  getting a short course of some oral hydralazine  as a rescue  medication as this seemed to help significantly with her blood pressure.  She is scheduled to see her oncology team on Monday.  Will plan for discharge if her CTs do not show concerning findings given the improvement in her symptoms and blood pressure.    Care to be transferred oncoming team to wait for results of CTs.  If CTs are reassuring, plan for discharge home for outpatient follow-up.    Final diagnoses:  Acute nonintractable headache, unspecified headache type  Elevated blood pressure reading    ED Discharge Orders          Ordered    hydrALAZINE  (APRESOLINE ) 10 MG tablet  3 times daily PRN,   Status:  Discontinued        06/06/24 2252    hydrALAZINE  (APRESOLINE ) 10 MG tablet  3 times daily PRN        06/06/24 2252           Clinical Impression: 1. Acute nonintractable headache, unspecified headache type   2. Elevated blood pressure reading     Disposition: Care to be transferred oncoming team to wait for results of CTs.  If CTs are reassuring, plan for discharge home for outpatient follow-up.  This note was prepared with assistance of Conservation officer, historic buildings. Occasional wrong-word or sound-a-like substitutions may have occurred due to the inherent limitations of voice recognition software.           Halia Franey, Lonni PARAS, MD 06/06/24 [phone]

## 2024-06-06 NOTE — ED Triage Notes (Addendum)
 BIB [facility] for HTN 212/90  BP at home trending high. Multiple Myleoma  Endorses 10/10 headache and minor tingling in right arm.

## 2024-06-06 NOTE — ED Notes (Signed)
 Patient transported to CT

## 2024-06-09 ENCOUNTER — Inpatient Hospital Stay

## 2024-06-09 VITALS — BP 138/75 | HR 83 | Temp 98.6°F | Resp 18 | Wt 142.0 lb

## 2024-06-09 DIAGNOSIS — Z5112 Encounter for antineoplastic immunotherapy: Secondary | ICD-10-CM | POA: Diagnosis not present

## 2024-06-09 DIAGNOSIS — Z95828 Presence of other vascular implants and grafts: Secondary | ICD-10-CM

## 2024-06-09 DIAGNOSIS — C9000 Multiple myeloma not having achieved remission: Secondary | ICD-10-CM

## 2024-06-09 LAB — CMP (CANCER CENTER ONLY)
ALT: 11 U/L (ref 0–44)
AST: 12 U/L — ABNORMAL LOW (ref 15–41)
Albumin: 4.3 g/dL (ref 3.5–5.0)
Alkaline Phosphatase: 77 U/L (ref 38–126)
Anion gap: 6 (ref 5–15)
BUN: 19 mg/dL (ref 8–23)
CO2: 27 mmol/L (ref 22–32)
Calcium: 8.9 mg/dL (ref 8.9–10.3)
Chloride: 107 mmol/L (ref 98–111)
Creatinine: 0.5 mg/dL (ref 0.44–1.00)
GFR, Estimated: >60 mL/min (ref >60)
Glucose, Bld: 131 mg/dL — ABNORMAL HIGH (ref 70–99)
Potassium: 4.3 mmol/L (ref 3.5–5.1)
Sodium: 140 mmol/L (ref 135–145)
Total Bilirubin: 1.5 mg/dL — ABNORMAL HIGH (ref 0.0–1.2)
Total Protein: 5.9 g/dL — ABNORMAL LOW (ref 6.5–8.1)

## 2024-06-09 LAB — CBC WITH DIFFERENTIAL (CANCER CENTER ONLY)
Abs Immature Granulocytes: 0.04 K/uL (ref 0.00–0.07)
Basophils Absolute: 0 K/uL (ref 0.0–0.1)
Basophils Relative: 0 %
Eosinophils Absolute: 0 K/uL (ref 0.0–0.5)
Eosinophils Relative: 0 %
HCT: 40.3 % (ref 36.0–46.0)
Hemoglobin: 13.4 g/dL (ref 12.0–15.0)
Immature Granulocytes: 1 %
Lymphocytes Relative: 7 %
Lymphs Abs: 0.6 K/uL — ABNORMAL LOW (ref 0.7–4.0)
MCH: 32.3 pg (ref 26.0–34.0)
MCHC: 33.3 g/dL (ref 30.0–36.0)
MCV: 97.1 fL (ref 80.0–100.0)
Monocytes Absolute: 0.1 K/uL (ref 0.1–1.0)
Monocytes Relative: 2 %
Neutro Abs: 7.4 K/uL (ref 1.7–7.7)
Neutrophils Relative %: 90 %
Platelet Count: 125 K/uL — ABNORMAL LOW (ref 150–400)
RBC: 4.15 MIL/uL (ref 3.87–5.11)
RDW: 13.1 % (ref 11.5–15.5)
WBC Count: 8.2 K/uL (ref 4.0–10.5)
nRBC: 0 % (ref 0.0–0.2)

## 2024-06-09 MED ORDER — SODIUM CHLORIDE 0.9% FLUSH
10.0000 mL | Freq: Once | INTRAVENOUS | Status: AC
  Administered 2024-06-09: 10 mL

## 2024-06-09 MED ORDER — SODIUM CHLORIDE 0.9 % IV SOLN: Freq: Once | INTRAVENOUS | Status: AC

## 2024-06-09 MED ORDER — SODIUM CHLORIDE 0.9 % IV SOLN: INTRAVENOUS | Status: DC

## 2024-06-09 MED ORDER — DEXTROSE 5 % IV SOLN
70.0000 mg/m2 | Freq: Once | INTRAVENOUS | Status: AC
  Administered 2024-06-09: 120 mg via INTRAVENOUS
  Filled 2024-06-09: qty 60

## 2024-06-09 NOTE — Progress Notes (Signed)
 Patient states she took dexamethasone  prior to her appointment today.

## 2024-06-09 NOTE — Patient Instructions (Signed)

## 2024-06-10 DIAGNOSIS — G4700 Insomnia, unspecified: Secondary | ICD-10-CM | POA: Diagnosis not present

## 2024-06-10 DIAGNOSIS — C9000 Multiple myeloma not having achieved remission: Secondary | ICD-10-CM | POA: Diagnosis not present

## 2024-06-10 DIAGNOSIS — I10 Essential (primary) hypertension: Secondary | ICD-10-CM | POA: Diagnosis not present

## 2024-06-10 DIAGNOSIS — I700 Atherosclerosis of aorta: Secondary | ICD-10-CM | POA: Diagnosis not present

## 2024-06-10 DIAGNOSIS — R519 Headache, unspecified: Secondary | ICD-10-CM | POA: Diagnosis not present

## 2024-06-17 ENCOUNTER — Inpatient Hospital Stay (HOSPITAL_BASED_OUTPATIENT_CLINIC_OR_DEPARTMENT_OTHER): Admitting: Hematology and Oncology

## 2024-06-17 ENCOUNTER — Inpatient Hospital Stay

## 2024-06-17 ENCOUNTER — Inpatient Hospital Stay: Attending: Hematology and Oncology

## 2024-06-17 VITALS — BP 144/83 | HR 80 | Temp 97.8°F | Resp 16 | Wt 142.3 lb

## 2024-06-17 VITALS — BP 134/56 | HR 81 | Temp 97.6°F | Resp 18

## 2024-06-17 DIAGNOSIS — C9000 Multiple myeloma not having achieved remission: Secondary | ICD-10-CM

## 2024-06-17 DIAGNOSIS — Z5112 Encounter for antineoplastic immunotherapy: Secondary | ICD-10-CM | POA: Insufficient documentation

## 2024-06-17 DIAGNOSIS — Z79899 Other long term (current) drug therapy: Secondary | ICD-10-CM | POA: Insufficient documentation

## 2024-06-17 DIAGNOSIS — Z95828 Presence of other vascular implants and grafts: Secondary | ICD-10-CM | POA: Diagnosis not present

## 2024-06-17 LAB — CMP (CANCER CENTER ONLY)
ALT: 17 U/L (ref 0–44)
AST: 15 U/L (ref 15–41)
Albumin: 3.9 g/dL (ref 3.5–5.0)
Alkaline Phosphatase: 87 U/L (ref 38–126)
Anion gap: 6 (ref 5–15)
BUN: 15 mg/dL (ref 8–23)
CO2: 28 mmol/L (ref 22–32)
Calcium: 8.9 mg/dL (ref 8.9–10.3)
Chloride: 109 mmol/L (ref 98–111)
Creatinine: 0.56 mg/dL (ref 0.44–1.00)
GFR, Estimated: >60 mL/min (ref >60)
Glucose, Bld: 102 mg/dL — ABNORMAL HIGH (ref 70–99)
Potassium: 4.2 mmol/L (ref 3.5–5.1)
Sodium: 143 mmol/L (ref 135–145)
Total Bilirubin: 1.5 mg/dL — ABNORMAL HIGH (ref 0.0–1.2)
Total Protein: 5.8 g/dL — ABNORMAL LOW (ref 6.5–8.1)

## 2024-06-17 LAB — CBC WITH DIFFERENTIAL (CANCER CENTER ONLY)
Abs Immature Granulocytes: 0.07 K/uL (ref 0.00–0.07)
Basophils Absolute: 0.1 K/uL (ref 0.0–0.1)
Basophils Relative: 1 %
Eosinophils Absolute: 0.2 K/uL (ref 0.0–0.5)
Eosinophils Relative: 2 %
HCT: 39.9 % (ref 36.0–46.0)
Hemoglobin: 13.1 g/dL (ref 12.0–15.0)
Immature Granulocytes: 1 %
Lymphocytes Relative: 8 %
Lymphs Abs: 0.7 K/uL (ref 0.7–4.0)
MCH: 31.9 pg (ref 26.0–34.0)
MCHC: 32.8 g/dL (ref 30.0–36.0)
MCV: 97.1 fL (ref 80.0–100.0)
Monocytes Absolute: 0.3 K/uL (ref 0.1–1.0)
Monocytes Relative: 4 %
Neutro Abs: 7.7 K/uL (ref 1.7–7.7)
Neutrophils Relative %: 84 %
Platelet Count: 187 K/uL (ref 150–400)
RBC: 4.11 MIL/uL (ref 3.87–5.11)
RDW: 13.1 % (ref 11.5–15.5)
WBC Count: 9.1 K/uL (ref 4.0–10.5)
nRBC: 0 % (ref 0.0–0.2)

## 2024-06-17 MED ORDER — SODIUM CHLORIDE 0.9% FLUSH
10.0000 mL | INTRAVENOUS | Status: DC | PRN
  Administered 2024-06-17: 10 mL

## 2024-06-17 MED ORDER — SODIUM CHLORIDE 0.9 % IV SOLN: Freq: Once | INTRAVENOUS | Status: AC

## 2024-06-17 MED ORDER — DEXTROSE 5 % IV SOLN
70.0000 mg/m2 | Freq: Once | INTRAVENOUS | Status: AC
  Administered 2024-06-17: 120 mg via INTRAVENOUS
  Filled 2024-06-17: qty 60

## 2024-06-17 MED ORDER — SODIUM CHLORIDE 0.9 % IV SOLN: INTRAVENOUS | Status: DC

## 2024-06-17 NOTE — Progress Notes (Signed)
 [facility] [phone]   [phone]  PROGRESS NOTE  Patient Care Team: Shayne Anes, MD as PCP - General (Internal Medicine)  Hematological/Oncological History # Free Lambda Multiple Myeloma 11/10/2022: WBC 5.68, Hgb 11.7, MCV 97.4, Plt 146 04/17/2023: WBC 4.53, Hgb 11.5, MCV 107.6, Plt 110 05/16/2023: establish care with Dr. Federico. Labs showed M protein 0.1 with Lambda 900, Kappa 3 with ratio 0.00.   06/01/2023: bone marrow biopsy performed, showed plasma cell myeloma nearly effacing approximately 80% of the cellular marrow.  06/25/2023: Cycle 1 Day 1 of VRD chemotherapy 07/23/2023: Cycle 2 Day 1 of VRD chemotherapy 08/13/2023: Cycle 3 Day 1 of VRD chemotherapy 09/03/2023: Cycle 1 Day 1 of Dara/Rev/Dex. 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 12/17/2023: Cycle 1 Day 1 of Kyprolis . Dara/Rev ineffective.   Interval History:  Brooke Whitaker 73 y.o. female with medical history significant for newly diagnosed multiple myeloma who presents for a follow up visit. The patient's last visit was on 06/02/2024. In the interim since the last visit she has had no major changes in her health.  On exam today Brooke Whitaker reports she has been well overall in the interim since her last visit.  She did however have an emergency room visit due to severe headache and was found to have markedly elevated blood pressure.  She was prescribed hydralazine  and told to take it when her blood pressure got to severely high.  She reports that she did however take her dexamethasone  40 mg p.o. this morning.  She notes that she is not having any pain anywhere else.  She denies any nausea, vomiting, or diarrhea.  She is not having any numbness or tingling of her fingers and toes.  She denies any runny nose, sore throat, cough.  Overall she feels quite well and is willing and able to continue on Kyprolis  therapy at this time.  A full 10 point ROS is otherwise negative.  Today we had  additional discussion with her son regarding options moving forward.  We discussed the slight decline in her lambda light chains which is reassuring.   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    Sigmoid diverticulosis    Wears glasses     SURGICAL HISTORY: Past Surgical History:  Procedure Laterality Date   BREAST BIOPSY Right 09/2011   benign    COLONOSCOPY  2010   CYSTOSCOPY/RETROGRADE/URETEROSCOPY/STONE EXTRACTION WITH BASKET Left 04/21/2014   Procedure: cystoscopy, left retrograde pylegram, LEFT URETEROSCOPY, laser lithotripsy with holmium laser, STONE EXTRACTION, uretheral calibration;  Surgeon: Norleen JINNY Seltzer, MD;  Location: [facility];  Service: Urology;  Laterality: Left;   DILATION AND EVACUATION  1995   EPISIOTOMY REPAIR, SEVERE TEAR  1989   IR IMAGING GUIDED PORT INSERTION  02/04/2024   LAPAROSCOPIC OVARIAN CYSTECTOMY  10/ 2000   NM MYOCAR PERF WALL MOTION  12/15/2009   Normal   US  ECHOCARDIOGRAPHY  12/15/2009   Normal study for age: mild MR,TR and trace PI    SOCIAL HISTORY: Social History   Socioeconomic History   Marital status: Married    Spouse name: Not on file   Number of children: 2   Years of education: Not on file   Highest education level: Not on file  Occupational History   Occupation: Print production planner  Tobacco Use   Smoking status: Never    Passive exposure: Never   Smokeless tobacco: Never  Vaping Use   Vaping status: Never Used  Substance and Sexual Activity   Alcohol  use: Yes    Alcohol /week: 2.0 standard drinks of alcohol     Types: 2 Glasses of wine per week   Drug use: No   Sexual activity: Not Currently    Partners: Male  Other Topics Concern   Not on file  Social History Narrative   Not on file   Social Drivers of Health   Financial  Resource Strain: Not on file  Food Insecurity: No Food Insecurity (05/16/2023)   Hunger Vital Sign    Worried About Running Out of Food in the Last Year: Never true    Ran Out of Food in the Last Year: Never true  Transportation Needs: No Transportation Needs (05/16/2023)   PRAPARE - Administrator, Civil Service (Medical): No    Lack of Transportation (Non-Medical): No  Physical Activity: Not on file  Stress: Not on file  Social Connections: Not on file  Intimate Partner Violence: Not At Risk (05/16/2023)   Humiliation, Afraid, Rape, and Kick questionnaire    Fear of Current or Ex-Partner: No    Emotionally Abused: No    Physically Abused: No    Sexually Abused: No    FAMILY HISTORY: Family History  Problem Relation Age of Onset   Diabetes Mother    Congestive Heart Failure Mother    Stroke Mother    Osteoporosis Mother    Other Mother        gallbladder ruptured   Heart attack Father    Diabetes Sister    Heart disease Sister    Heart disease Sister        x2   Hypertension Sister    Thyroid  disease Sister    Hypertension Sister    Diabetes Brother    Dementia Brother    Colon cancer Brother        mets to liver   Heart disease Brother        pacemaker   Stroke Brother    Liver cancer Brother    Heart disease Brother    Stomach cancer Neg Hx    Pancreatic cancer Neg Hx    Esophageal cancer Neg Hx     ALLERGIES:  is allergic to penicillins and sulfa antibiotics.  MEDICATIONS:  Current Outpatient Medications  Medication Sig Dispense Refill   acetaminophen  (TYLENOL ) 500 MG tablet Take 1,000 mg by mouth every 6 (six) hours as needed for mild pain (pain score 1-3) or headache.     acyclovir  (ZOVIRAX ) 400 MG tablet Take 400 mg by mouth in the morning.     carfilzomib  (KYPROLIS ) 10 MG SOLR as directed Intravenous clarify dose. infusion is every monday 3 times a month with a break one week each month. long term med     Cholecalciferol 25 MCG (1000 UT)  capsule Take 1,000 Units by mouth daily.     Cyanocobalamin  (VITAMIN B 12 PO) Take 1,000 mcg by mouth daily.     dexamethasone  (DECADRON ) 4 MG tablet Take 10 tablets (40 mg total) by mouth once a week. Take once weekly on the day you receive the chemotherapy injection. 40 tablet 5   fluticasone (FLONASE) 50 MCG/ACT nasal spray Place 2 sprays into both nostrils as needed for rhinitis or allergies.     hydrALAZINE  (  APRESOLINE ) 10 MG tablet Take 1 tablet (10 mg total) by mouth 3 (three) times daily as needed (if BP over 180 and not responding to home meds). 21 tablet 0   HYDROcodone -acetaminophen  (NORCO/VICODIN) 5-325 MG tablet Take 1 tablet by mouth every 6 (six) hours as needed for moderate pain (pain score 4-6). (Patient taking differently: Take 1 tablet by mouth every Monday. Take one tablet by mouth every Monday after infusion.) 10 tablet 0   levocetirizine (XYZAL) 5 MG tablet Take 5 mg by mouth at bedtime.     lidocaine -prilocaine  (EMLA ) cream Apply 1 Application topically as needed. (Patient taking differently: Apply 1 Application topically every Monday.) 30 g 1   ondansetron  (ZOFRAN ) 8 MG tablet Take 1 tablet (8 mg total) by mouth every 8 (eight) hours as needed. 30 tablet 0   Polyethyl Glyc-Propyl Glyc PF (SYSTANE PRESERVATIVE FREE) 0.4-0.3 % SOLN Place 1-2 drops into both eyes daily as needed (dry eyes).     prochlorperazine  (COMPAZINE ) 10 MG tablet Take 1 tablet (10 mg total) by mouth every 6 (six) hours as needed for nausea or vomiting. 30 tablet 0   rosuvastatin (CRESTOR) 5 MG tablet Take 5 mg by mouth daily.     telmisartan (MICARDIS) 20 MG tablet Take 40 mg by mouth at bedtime.     No current facility-administered medications for this visit.   Facility-Administered Medications Ordered in Other Visits  Medication Dose Route Frequency Provider Last Rate Last Admin   0.9 %  sodium chloride  infusion   Intravenous Continuous Bahar Shelden T IV, MD   Stopped at 06/17/24 1442   sodium chloride   flush (NS) 0.9 % injection 10 mL  10 mL Intracatheter PRN Alden Bensinger T IV, MD   10 mL at 06/17/24 1442    REVIEW OF SYSTEMS:   Constitutional: ( - ) fevers, ( - )  chills , ( - ) night sweats Eyes: ( - ) blurriness of vision, ( - ) double vision, ( - ) watery eyes Ears, nose, mouth, throat, and face: ( - ) mucositis, ( - ) sore throat Respiratory: ( - ) cough, ( - ) dyspnea, ( - ) wheezes Cardiovascular: ( - ) palpitation, ( - ) chest discomfort, ( - ) lower extremity swelling Gastrointestinal:  ( - ) nausea, ( - ) heartburn, ( - ) change in bowel habits Skin: ( - ) abnormal skin rashes Lymphatics: ( - ) new lymphadenopathy, ( - ) easy bruising Neurological: ( - ) numbness, ( - ) tingling, ( - ) new weaknesses Behavioral/Psych: ( - ) mood change, ( - ) new changes  All other systems were reviewed with the patient and are negative.  PHYSICAL EXAMINATION: ECOG PERFORMANCE STATUS: 0 - Asymptomatic  Vitals:   06/17/24 1154  BP: (!) 144/83  Pulse: 80  Resp: 16  Temp: 97.8 F (36.6 C)  SpO2: 97%    Filed Weights   06/17/24 1154  Weight: 142 lb 5 oz (64.6 kg)     GENERAL: Well-appearing elderly Caucasian female, alert, no distress and comfortable SKIN: skin color, texture, turgor are normal, no rashes or significant lesions EYES: conjunctiva are pink and non-injected, sclera clear LUNGS: clear to auscultation and percussion with normal breathing effort HEART: regular rate & rhythm and no murmurs and no lower extremity edema Musculoskeletal: no cyanosis of digits and no clubbing  PSYCH: alert & oriented x 3, fluent speech NEURO: no focal motor/sensory deficits  LABORATORY DATA:  I have reviewed the data as  listed    Latest Ref Rng & Units 06/17/2024   11:25 AM 06/09/2024    1:51 PM 06/06/2024    7:54 PM  CBC  WBC 4.0 - 10.5 K/uL 9.1  8.2  6.2   Hemoglobin 12.0 - 15.0 g/dL 86.8  86.5  86.5   Hematocrit 36.0 - 46.0 % 39.9  40.3  40.7   Platelets 150 - 400 K/uL 187  125  91         Latest Ref Rng & Units 06/17/2024   11:25 AM 06/09/2024    1:51 PM 06/06/2024    7:54 PM  CMP  Glucose 70 - 99 mg/dL 897  868  94   BUN 8 - 23 mg/dL 15  19  13    Creatinine 0.44 - 1.00 mg/dL 9.43  9.49  9.29   Sodium 135 - 145 mmol/L 143  140  141   Potassium 3.5 - 5.1 mmol/L 4.2  4.3  3.6   Chloride 98 - 111 mmol/L 109  107  107   CO2 22 - 32 mmol/L 28  27  24    Calcium 8.9 - 10.3 mg/dL 8.9  8.9  9.2   Total Protein 6.5 - 8.1 g/dL 5.8  5.9  5.7   Total Bilirubin 0.0 - 1.2 mg/dL 1.5  1.5  1.7   Alkaline Phos 38 - 126 U/L 87  77  73   AST 15 - 41 U/L 15  12  19    ALT 0 - 44 U/L 17  11  18      Lab Results  Component Value Date   MPROTEIN Not Observed 06/02/2024   MPROTEIN Not Observed 05/05/2024   MPROTEIN Not Observed 04/07/2024   Lab Results  Component Value Date   KPAFRELGTCHN 1.3 (L) 06/02/2024   KPAFRELGTCHN 1.4 (L) 05/05/2024   KPAFRELGTCHN 1.3 (L) 04/07/2024   LAMBDASER 46.3 (H) 06/02/2024   LAMBDASER 54.2 (H) 05/05/2024   LAMBDASER 50.2 (H) 04/07/2024   KAPLAMBRATIO 0.03 (L) 06/02/2024   KAPLAMBRATIO 0.03 (L) 05/05/2024   KAPLAMBRATIO 0.03 (L) 04/07/2024    RADIOGRAPHIC STUDIES: CT VENOGRAM HEAD Result Date: 06/07/2024 CLINICAL DATA:  Dural venous sinus thrombosis suspected Multiple myeloma with blood pressure over 200 with 10 out of 10 pain in upper part of her neck, head with some nausea. Sent here for workup and imaging. Rule out dural venous sinus thrombus or dissection or bleed EXAM: CT VENOGRAM HEAD TECHNIQUE: Venographic phase images of the brain were obtained following the administration of intravenous contrast. Multiplanar reformats and maximum intensity projections were generated. RADIATION DOSE REDUCTION: This exam was performed according to the departmental dose-optimization program which includes automated exposure control, adjustment of the mA and/or kV according to patient size and/or use of iterative reconstruction technique. CONTRAST:  75mL  OMNIPAQUE  IOHEXOL  350 MG/ML SOLN COMPARISON:  None Available. FINDINGS: No evidence of dural venous sinus thrombosis. Specifically, the superior sagittal, straight, sigmoid, and transverse sinuses are patent. Symmetric cavernous sinus opacification. IMPRESSION: No evidence of dural venous sinus thrombosis. Electronically Signed   By: Gilmore GORMAN Molt M.D.   On: 06/07/2024 00:05   CT ANGIO HEAD NECK W WO CM Result Date: 06/07/2024 CLINICAL DATA:  Vertebral artery dissection suspected Multiple myeloma with blood pressure over 200 with 10 out of 10 pain in upper part of her neck, head with some nausea. Sent here for workup and imaging. Rule out dural venous sinus thrombus or dissection or bleed EXAM: CT ANGIOGRAPHY HEAD AND NECK WITH AND WITHOUT CONTRAST  TECHNIQUE: Multidetector CT imaging of the head and neck was performed using the standard protocol during bolus administration of intravenous contrast. Multiplanar CT image reconstructions and MIPs were obtained to evaluate the vascular anatomy. Carotid stenosis measurements (when applicable) are obtained utilizing NASCET criteria, using the distal internal carotid diameter as the denominator. RADIATION DOSE REDUCTION: This exam was performed according to the departmental dose-optimization program which includes automated exposure control, adjustment of the mA and/or kV according to patient size and/or use of iterative reconstruction technique. CONTRAST:  75mL OMNIPAQUE  IOHEXOL  350 MG/ML SOLN COMPARISON:  None Available. FINDINGS: CT HEAD FINDINGS Brain: No evidence of acute infarction, hemorrhage, hydrocephalus, extra-axial collection or mass lesion/mass effect. Patchy white matter hypodensities are nonspecific but compatible with chronic microvascular ischemic disease. Vascular: See below. Skull: No acute fracture. Sinuses/Orbits: Clear sinuses.  No acute orbital findings. Other: No mastoid effusions. Review of the MIP images confirms the above findings CTA NECK  FINDINGS Aortic arch: Calcific atherosclerosis. Great vessel origins are patent. Right carotid system: No evidence of dissection, stenosis (50% or greater), or occlusion. Left carotid system: No evidence of dissection, stenosis (50% or greater), or occlusion. Vertebral arteries: Right dominant. No evidence of dissection, stenosis (50% or greater), or occlusion. Skeleton: No evidence of acute abnormality limited assessment. Other neck: Thyroid  nodules measuring up to 1.6 cm. Upper chest: Lung apices are clear. Review of the MIP images confirms the above findings CTA HEAD FINDINGS Anterior circulation: Bilateral intracranial ICAs, MCAs, and ACAs are patent without proximal hemodynamically significant stenosis. Multifocal mild atherosclerotic irregularity and narrowing. Posterior circulation: Bilateral intradural vertebral arteries, basilar artery and bilateral posterior arteries are patent without proximal hemodynamically significant stenosis. Multifocal mild atherosclerotic irregularity and narrowing. Venous sinuses: As permitted by contrast timing, patent. Review of the MIP images confirms the above findings IMPRESSION: 1. No evidence of acute intracranial abnormality. 2. No large vessel occlusion or proximal hemodynamically significant stenosis. 3. No evidence of dissection. 4. Thyroid  nodules measuring up to 1.6 cm. Recommend thyroid  USxcsadd (ref: J Am Coll Radiol. 2015 Feb;12(2): 143-50). Electronically Signed   By: Gilmore GORMAN Molt M.D.   On: 06/07/2024 00:04     ASSESSMENT & PLAN Brooke Whitaker 73 y.o. female with medical history significant for newly diagnosed multiple myeloma who presents for a follow up visit.   Previously we discussed the diagnosis of multiple myeloma.  We discussed that this is a blood cancer that is not curable.  The goal is to get the patient into a durable remission with treatment.  We discussed the need for a minimum of 6 months of chemotherapy treatment then followed by  maintenance therapy if the M protein has reached 0 and the free light chains are within normal limits.  We discussed expected side effects of the regimen and the plan moving forward.  The patient voiced her understanding of our findings, expected side effects, and risks/benefits.  She is okay with proceeding with chemotherapy at this time.  # Free Lambda Multiple Myeloma -- Diagnosis confirmed by the presence of a macrocytic anemia and 80% plasma cells in the bone marrow --Initially started with VRD chemotherapy with the patient developed rash as result of the Revlimid  and ocular toxicity from the Velcade .  Dropped the Revlimid  down to 10 mg p.o. daily and transition to Darzalex  for her next treatment. --UPEP showed marked Bence Jones proteins in the urine, no evidence of lytic lesions on metastatic survey PLAN: -- Continue Kyprolis /Dex therapy.  Today is Cycle 7 Day 1 --Labs today  show white blood cell 9.1, hemoglobin 13.1, MCV 97.1, platelets 187.  LFTs and creatinine within normal limits.  --On 06/02/2024 patient had kappa 1.3, lambda 46.3 ratio 0.03 with an M protein undetectable. -- Offer the addition of Pomalyst but the patient declined and want to continue on Kyprolis  alone.  She is aware of the rising lambda chains. -- Plan for return to clinic in 2 weeks with interval continued Kyprolis  therapy.  #Supportive Care -- chemotherapy education complete -- port placed -- zofran  8mg  q8H PRN and compazine  10mg  PO q6H for nausea -- acyclovir  400mg  PO BID for VCZ prophylaxis -- EMLA  cream for port -- Zometa to be added after dental clearance.  -- no pain medication required at this time.   No orders of the defined types were placed in this encounter.  All questions were answered. The patient knows to call the clinic with any problems, questions or concerns.  A total of more than 30 minutes were spent on this encounter with face-to-face time and non-face-to-face time, including preparing to  see the patient, ordering tests and/or medications, counseling the patient and coordination of care as outlined above.   Norleen IVAR Kidney, MD Department of Hematology/Oncology [facility] Phone: [phone] Pager: [phone] Email: [email or web address]  06/17/2024 7:33 PM

## 2024-06-17 NOTE — Patient Instructions (Signed)
 [facility] - A DEPT OF [facility]  Discharge Instructions: Thank you for choosing [facility] to provide your oncology and hematology care.   If you have a lab appointment with the Cancer Center, please go directly to the Cancer Center and check in at the registration area.   Wear comfortable clothing and clothing appropriate for easy access to any Portacath or PICC line.   We strive to give you quality time with your provider. You may need to reschedule your appointment if you arrive late (15 or more minutes).  Arriving late affects you and other patients whose appointments are after yours.  Also, if you miss three or more appointments without notifying the office, you may be dismissed from the clinic at the provider's discretion.      For prescription refill requests, have your pharmacy contact our office and allow 72 hours for refills to be completed.    Today you received the following chemotherapy and/or immunotherapy agent: Carfilzomib (Kyprolis)      To help prevent nausea and vomiting after your treatment, we encourage you to take your nausea medication as directed.  BELOW ARE SYMPTOMS THAT SHOULD BE REPORTED IMMEDIATELY: *FEVER GREATER THAN 100.4 F (38 C) OR HIGHER *CHILLS OR SWEATING *NAUSEA AND VOMITING THAT IS NOT CONTROLLED WITH YOUR NAUSEA MEDICATION *UNUSUAL SHORTNESS OF BREATH *UNUSUAL BRUISING OR BLEEDING *URINARY PROBLEMS (pain or burning when urinating, or frequent urination) *BOWEL PROBLEMS (unusual diarrhea, constipation, pain near the anus) TENDERNESS IN MOUTH AND THROAT WITH OR WITHOUT PRESENCE OF ULCERS (sore throat, sores in mouth, or a toothache) UNUSUAL RASH, SWELLING OR PAIN  UNUSUAL VAGINAL DISCHARGE OR ITCHING   Items with * indicate a potential emergency and should be followed up as soon as possible or go to the Emergency Department if any problems should occur.  Please show the CHEMOTHERAPY ALERT CARD or  IMMUNOTHERAPY ALERT CARD at check-in to the Emergency Department and triage nurse.  Should you have questions after your visit or need to cancel or reschedule your appointment, please contact [facility] - [facility]  Dept: [phone]  and follow the prompts.  Office hours are 8:00 a.m. to 4:30 p.m. Monday - Friday. Please note that voicemails left after 4:00 p.m. may not be returned until the following business day.  We are closed weekends and major holidays. You have access to a nurse at all times for urgent questions. Please call the main number to the clinic Dept: [phone] and follow the prompts.   For any non-urgent questions, you may also contact your provider using MyChart. We now offer e-Visits for anyone 81 and older to request care online for non-urgent symptoms. For details visit [email or web address].   Also download the MyChart app! Go to the app store, search "MyChart", open the app, select [place], and log in with your MyChart username and password.

## 2024-06-17 NOTE — Progress Notes (Signed)
Pt. states she took Decadron 40 mg po this morning prior to treatment.

## 2024-06-18 ENCOUNTER — Other Ambulatory Visit: Payer: Self-pay

## 2024-06-23 ENCOUNTER — Telehealth: Payer: Self-pay | Admitting: Cardiovascular Disease

## 2024-06-23 ENCOUNTER — Telehealth: Payer: Self-pay | Admitting: *Deleted

## 2024-06-23 DIAGNOSIS — J302 Other seasonal allergic rhinitis: Secondary | ICD-10-CM | POA: Diagnosis not present

## 2024-06-23 DIAGNOSIS — C9000 Multiple myeloma not having achieved remission: Secondary | ICD-10-CM | POA: Diagnosis not present

## 2024-06-23 DIAGNOSIS — I10 Essential (primary) hypertension: Secondary | ICD-10-CM | POA: Diagnosis not present

## 2024-06-23 DIAGNOSIS — R519 Headache, unspecified: Secondary | ICD-10-CM | POA: Diagnosis not present

## 2024-06-23 NOTE — Telephone Encounter (Signed)
 Spoke to patient she stated her B/P has been elevated.B/P ranging 196/100,171/90,169/91,189/96,179/92.Pulse ranges 70 to 90.Stated PCP Dr.Perrini increased Micardis to 80 mg daily last Thurs 9/4 and increased Hydralazine  to 25 mg three times a day if systolic B/P greater than 160..B/P today 129/80,118/80.Stated Dr.Perrini and Oncologist Dr.Dorsy advised her to see Dr.Berry.Appointment scheduled with Dr.Berry 9/10 at 3:15 pm.Advised to continue to monitor Pulse and B/P bring readings and all medications to appointment.

## 2024-06-23 NOTE — Telephone Encounter (Signed)
 Received call from pt. She states she has had trouble with her BP all weekend (elevated BP). Her Apresoline  has been increased to 20 mg 3 x a day and her Telmisartin has been increased to 80 mg  @ night. She states she has not noticed much improvement in her BP yet. She will contacting her PCP, Dr. Shayne today for more BP management. She is off this week from treatment and scheduled next week. She is asking what she should do about treatment.   Discussed the above with Dr. Federico. He states he will reduce the steroids going forward. Pt made aware of this. She voiced understanding. She will take 20 mg of Decadron  next week instead of 40 mg

## 2024-06-23 NOTE — Telephone Encounter (Signed)
 Pt c/o BP issue: STAT if pt c/o blurred vision, one-sided weakness or slurred speech.  STAT if BP is GREATER than 180/120 TODAY.  STAT if BP is LESS than 90/60 and SYMPTOMATIC TODAY  1. What is your BP concern? Pt called in stating her bp has been high for a few weeks.   2. Have you taken any BP medication today? Yes   3. What are your last 5 BP readings? 118 - top number currently  180-200 top number high for about 3 weeks ago   4. Are you having any other symptoms (ex. Dizziness, headache, blurred vision, passed out)? No

## 2024-06-24 DIAGNOSIS — G4700 Insomnia, unspecified: Secondary | ICD-10-CM | POA: Diagnosis not present

## 2024-06-24 DIAGNOSIS — E785 Hyperlipidemia, unspecified: Secondary | ICD-10-CM | POA: Diagnosis not present

## 2024-06-24 DIAGNOSIS — M1990 Unspecified osteoarthritis, unspecified site: Secondary | ICD-10-CM | POA: Diagnosis not present

## 2024-06-24 DIAGNOSIS — D7589 Other specified diseases of blood and blood-forming organs: Secondary | ICD-10-CM | POA: Diagnosis not present

## 2024-06-24 DIAGNOSIS — M8580 Other specified disorders of bone density and structure, unspecified site: Secondary | ICD-10-CM | POA: Diagnosis not present

## 2024-06-24 DIAGNOSIS — Z23 Encounter for immunization: Secondary | ICD-10-CM | POA: Diagnosis not present

## 2024-06-24 DIAGNOSIS — C9000 Multiple myeloma not having achieved remission: Secondary | ICD-10-CM | POA: Diagnosis not present

## 2024-06-24 DIAGNOSIS — Z452 Encounter for adjustment and management of vascular access device: Secondary | ICD-10-CM | POA: Diagnosis not present

## 2024-06-24 DIAGNOSIS — I10 Essential (primary) hypertension: Secondary | ICD-10-CM | POA: Diagnosis not present

## 2024-06-24 DIAGNOSIS — I700 Atherosclerosis of aorta: Secondary | ICD-10-CM | POA: Diagnosis not present

## 2024-06-24 DIAGNOSIS — R519 Headache, unspecified: Secondary | ICD-10-CM | POA: Diagnosis not present

## 2024-06-24 DIAGNOSIS — J302 Other seasonal allergic rhinitis: Secondary | ICD-10-CM | POA: Diagnosis not present

## 2024-06-25 ENCOUNTER — Other Ambulatory Visit: Payer: Self-pay

## 2024-06-25 ENCOUNTER — Ambulatory Visit: Attending: Cardiovascular Disease | Admitting: Cardiovascular Disease

## 2024-06-25 ENCOUNTER — Encounter: Payer: Self-pay | Admitting: Cardiovascular Disease

## 2024-06-25 VITALS — BP 122/72 | HR 89 | Ht 61.0 in | Wt 141.6 lb

## 2024-06-25 DIAGNOSIS — I10 Essential (primary) hypertension: Secondary | ICD-10-CM | POA: Diagnosis not present

## 2024-06-25 DIAGNOSIS — E782 Mixed hyperlipidemia: Secondary | ICD-10-CM | POA: Diagnosis not present

## 2024-06-25 NOTE — Patient Instructions (Signed)
 Medication Instructions:  Your physician recommends that you continue on your current medications as directed. Please refer to the Current Medication list given to you today.  *If you need a refill on your cardiac medications before your next appointment, please call your pharmacy*   Follow-Up: At [facility], you and your health needs are our priority.  As part of our continuing mission to provide you with exceptional heart care, our providers are all part of one team.  This team includes your primary Cardiologist (physician) and Advanced Practice Providers or APPs (Physician Assistants and Nurse Practitioners) who all work together to provide you with the care you need, when you need it.  Your next appointment:   6 month(s)  Provider:   Jon Hails, PA-C, Callie Goodrich, PA-C, Kathleen Johnson, PA-C, Damien Braver, NP, or Katlyn West, NP        Then, Dorn Lesches, MD will plan to see you again in 12 month(s).

## 2024-06-25 NOTE — Assessment & Plan Note (Signed)
 History of hyperlipidemia on low-dose statin therapy with lipid profile performed 04/17/2023 revealing total cholesterol 176, LDL of 88 and HDL of 88.  Given her coronary calcium score of 0 she is at goal for primary prevention.

## 2024-06-25 NOTE — Assessment & Plan Note (Signed)
 History of essential hypertension on telmisartan.  Recently she has had spikes in her blood pressure several days after receiving steroids for her multiple myeloma.  She is now getting as needed hydralazine  for her blood pressure spikes.  Ordinarily, her blood pressure is well-controlled on Micardis.

## 2024-06-25 NOTE — Progress Notes (Signed)
 06/25/2024 Brooke Whitaker   1950-11-24  [id number]  Primary Physician Shayne Anes, MD Primary Cardiologist: Dorn JINNY Lesches MD [facility] [place], [place]  HPI:  Brooke Whitaker is a 73 y.o.  mildly overweight, married Caucasian female, mother of 2, patient of Dr. Anes Roe who I saw in the office 08/29/2023.SABRA  Her risk factors include dyslipidemia and a strong family history of heart disease. She had a father who had his first MI at age 57 and died of an MI at age 69. She had a sister who died suddenly of an MI at age 3.  She had an echo and a Myoview  performed March 2011 which were entirely normal.    She had a coronary calcium score performed by Dr. Shayne 12/25/2020 which was 0.  She denies chest pain or shortness of breath.  She was recently seen in the ER with headache and hypertension.  It appears that her blood pressure spiked several days after getting steroids for her multiple myeloma.  Ordinarily her blood pressure is well-controlled on Micardis.  She was placed on as needed hydralazine .  She did have a 2D echo performed/10/25 which was essentially normal.     Current Meds  Medication Sig   acetaminophen  (TYLENOL ) 500 MG tablet Take 1,000 mg by mouth every 6 (six) hours as needed for mild pain (pain score 1-3) or headache.   acyclovir  (ZOVIRAX ) 400 MG tablet Take 400 mg by mouth in the morning.   ALPRAZolam (XANAX) 0.25 MG tablet Take 0.25 mg by mouth at bedtime as needed.   carfilzomib  (KYPROLIS ) 10 MG SOLR as directed Intravenous clarify dose. infusion is every monday 3 times a month with a break one week each month. long term med   Cholecalciferol 25 MCG (1000 UT) capsule Take 1,000 Units by mouth daily.   Cyanocobalamin  (VITAMIN B 12 PO) Take 1,000 mcg by mouth daily.   dexamethasone  (DECADRON ) 4 MG tablet Take 10 tablets (40 mg total) by mouth once a week. Take once weekly on the day you receive the chemotherapy injection.   fluticasone (FLONASE) 50 MCG/ACT nasal spray  Place 2 sprays into both nostrils as needed for rhinitis or allergies.   hydrALAZINE  (APRESOLINE ) 25 MG tablet Take 25 mg three times a day as needed for systolic Blood pressure greater than 160   HYDROcodone -acetaminophen  (NORCO/VICODIN) 5-325 MG tablet Take 1 tablet by mouth every 6 (six) hours as needed for moderate pain (pain score 4-6). (Patient taking differently: Take 1 tablet by mouth every Monday. Take one tablet by mouth every Monday after infusion.)   levocetirizine (XYZAL) 5 MG tablet Take 5 mg by mouth at bedtime.   lidocaine -prilocaine  (EMLA ) cream Apply 1 Application topically as needed. (Patient taking differently: Apply 1 Application topically every Monday.)   ondansetron  (ZOFRAN ) 8 MG tablet Take 1 tablet (8 mg total) by mouth every 8 (eight) hours as needed.   Polyethyl Glyc-Propyl Glyc PF (SYSTANE PRESERVATIVE FREE) 0.4-0.3 % SOLN Place 1-2 drops into both eyes daily as needed (dry eyes).   prochlorperazine  (COMPAZINE ) 10 MG tablet Take 1 tablet (10 mg total) by mouth every 6 (six) hours as needed for nausea or vomiting.   rosuvastatin (CRESTOR) 5 MG tablet Take 5 mg by mouth daily.   telmisartan (MICARDIS) 80 MG tablet Take 1 tablet (80 mg total) by mouth daily.   triamcinolone  (NASACORT  ALLERGY 24HR) 55 MCG/ACT AERO nasal inhaler Place 2 sprays into the nose daily.     Allergies  Allergen Reactions   Penicillins Other (See Comments)    UNKNOWN Childhood REACTION   Sulfa Antibiotics Other (See Comments)    UNKNOWN Childhood REACTION    Social History   Socioeconomic History   Marital status: Married    Spouse name: Not on file   Number of children: 2   Years of education: Not on file   Highest education level: Not on file  Occupational History   Occupation: Print production planner  Tobacco Use   Smoking status: Never    Passive exposure: Never   Smokeless tobacco: Never  Vaping Use   Vaping status: Never Used  Substance and Sexual Activity   Alcohol  use: Yes     Alcohol /week: 2.0 standard drinks of alcohol     Types: 2 Glasses of wine per week   Drug use: No   Sexual activity: Not Currently    Partners: Male  Other Topics Concern   Not on file  Social History Narrative   Not on file   Social Drivers of Health   Financial Resource Strain: Not on file  Food Insecurity: No Food Insecurity (05/16/2023)   Hunger Vital Sign    Worried About Running Out of Food in the Last Year: Never true    Ran Out of Food in the Last Year: Never true  Transportation Needs: No Transportation Needs (05/16/2023)   PRAPARE - Administrator, Civil Service (Medical): No    Lack of Transportation (Non-Medical): No  Physical Activity: Not on file  Stress: Not on file  Social Connections: Not on file  Intimate Partner Violence: Not At Risk (05/16/2023)   Humiliation, Afraid, Rape, and Kick questionnaire    Fear of Current or Ex-Partner: No    Emotionally Abused: No    Physically Abused: No    Sexually Abused: No     Review of Systems: General: negative for chills, fever, night sweats or weight changes.  Cardiovascular: negative for chest pain, dyspnea on exertion, edema, orthopnea, palpitations, paroxysmal nocturnal dyspnea or shortness of breath Dermatological: negative for rash Respiratory: negative for cough or wheezing Urologic: negative for hematuria Abdominal: negative for nausea, vomiting, diarrhea, bright red blood per rectum, melena, or hematemesis Neurologic: negative for visual changes, syncope, or dizziness All other systems reviewed and are otherwise negative except as noted above.    Blood pressure 122/72, pulse 89, height 5' 1 (1.549 m), weight 141 lb 9.6 oz (64.2 kg), last menstrual period 10/16/2001, SpO2 98%.  General appearance: alert and no distress Neck: no adenopathy, no carotid bruit, no JVD, supple, symmetrical, trachea midline, and thyroid  not enlarged, symmetric, no tenderness/mass/nodules Lungs: clear to auscultation  bilaterally Heart: regular rate and rhythm, S1, S2 normal, no murmur, click, rub or gallop Extremities: extremities normal, atraumatic, no cyanosis or edema Pulses: 2+ and symmetric Skin: Skin color, texture, turgor normal. No rashes or lesions Neurologic: Grossly normal  EKG not performed today      ASSESSMENT AND PLAN:   Hyperlipidemia History of hyperlipidemia on low-dose statin therapy with lipid profile performed 04/17/2023 revealing total cholesterol 176, LDL of 88 and HDL of 88.  Given her coronary calcium score of 0 she is at goal for primary prevention.  Essential hypertension History of essential hypertension on telmisartan.  Recently she has had spikes in her blood pressure several days after receiving steroids for her multiple myeloma.  She is now getting as needed hydralazine  for her blood pressure spikes.  Ordinarily, her blood pressure is well-controlled on Micardis.  Dorn DOROTHA Lesches MD FACP,FACC,FAHA, [facility] 06/25/2024 3:31 PM

## 2024-06-26 ENCOUNTER — Other Ambulatory Visit: Payer: Self-pay | Admitting: Hematology and Oncology

## 2024-06-26 ENCOUNTER — Encounter: Payer: Self-pay | Admitting: Hematology and Oncology

## 2024-06-26 MED ORDER — HYDROCODONE-ACETAMINOPHEN 5-325 MG PO TABS: 1.0000 | ORAL_TABLET | Freq: Four times a day (QID) | ORAL | 0 refills | Status: AC | PRN

## 2024-06-30 ENCOUNTER — Inpatient Hospital Stay

## 2024-06-30 ENCOUNTER — Inpatient Hospital Stay: Admitting: Hematology and Oncology

## 2024-06-30 ENCOUNTER — Ambulatory Visit

## 2024-06-30 VITALS — BP 142/62 | HR 73 | Temp 98.1°F | Resp 14 | Wt 142.2 lb

## 2024-06-30 DIAGNOSIS — C9000 Multiple myeloma not having achieved remission: Secondary | ICD-10-CM

## 2024-06-30 DIAGNOSIS — Z95828 Presence of other vascular implants and grafts: Secondary | ICD-10-CM

## 2024-06-30 DIAGNOSIS — Z5112 Encounter for antineoplastic immunotherapy: Secondary | ICD-10-CM | POA: Diagnosis not present

## 2024-06-30 LAB — CMP (CANCER CENTER ONLY)
ALT: 13 U/L (ref 0–44)
AST: 13 U/L — ABNORMAL LOW (ref 15–41)
Albumin: 4.2 g/dL (ref 3.5–5.0)
Alkaline Phosphatase: 81 U/L (ref 38–126)
Anion gap: 6 (ref 5–15)
BUN: 16 mg/dL (ref 8–23)
CO2: 27 mmol/L (ref 22–32)
Calcium: 8.8 mg/dL — ABNORMAL LOW (ref 8.9–10.3)
Chloride: 108 mmol/L (ref 98–111)
Creatinine: 0.62 mg/dL (ref 0.44–1.00)
GFR, Estimated: >60 mL/min (ref >60)
Glucose, Bld: 114 mg/dL — ABNORMAL HIGH (ref 70–99)
Potassium: 4.3 mmol/L (ref 3.5–5.1)
Sodium: 141 mmol/L (ref 135–145)
Total Bilirubin: 1.3 mg/dL — ABNORMAL HIGH (ref 0.0–1.2)
Total Protein: 6 g/dL — ABNORMAL LOW (ref 6.5–8.1)

## 2024-06-30 LAB — CBC WITH DIFFERENTIAL (CANCER CENTER ONLY)
Abs Immature Granulocytes: 0.04 K/uL (ref 0.00–0.07)
Basophils Absolute: 0.1 K/uL (ref 0.0–0.1)
Basophils Relative: 1 %
Eosinophils Absolute: 0.1 K/uL (ref 0.0–0.5)
Eosinophils Relative: 1 %
HCT: 39.4 % (ref 36.0–46.0)
Hemoglobin: 13.1 g/dL (ref 12.0–15.0)
Immature Granulocytes: 0 %
Lymphocytes Relative: 7 %
Lymphs Abs: 0.7 K/uL (ref 0.7–4.0)
MCH: 32.1 pg (ref 26.0–34.0)
MCHC: 33.2 g/dL (ref 30.0–36.0)
MCV: 96.6 fL (ref 80.0–100.0)
Monocytes Absolute: 0.1 K/uL (ref 0.1–1.0)
Monocytes Relative: 1 %
Neutro Abs: 8.5 K/uL — ABNORMAL HIGH (ref 1.7–7.7)
Neutrophils Relative %: 90 %
Platelet Count: 223 K/uL (ref 150–400)
RBC: 4.08 MIL/uL (ref 3.87–5.11)
RDW: 12.7 % (ref 11.5–15.5)
WBC Count: 9.4 K/uL (ref 4.0–10.5)
nRBC: 0 % (ref 0.0–0.2)

## 2024-06-30 MED ORDER — DEXTROSE 5 % IV SOLN
70.0000 mg/m2 | Freq: Once | INTRAVENOUS | Status: AC
  Administered 2024-06-30: 120 mg via INTRAVENOUS
  Filled 2024-06-30: qty 60

## 2024-06-30 MED ORDER — SODIUM CHLORIDE 0.9 % IV SOLN: INTRAVENOUS | Status: DC

## 2024-06-30 MED ORDER — SODIUM CHLORIDE 0.9 % IV SOLN: Freq: Once | INTRAVENOUS | Status: AC

## 2024-06-30 NOTE — Progress Notes (Signed)
 [facility] [phone]   [phone]  PROGRESS NOTE  Patient Care Team: Shayne Anes, MD as PCP - General (Internal Medicine)  Hematological/Oncological History # Free Lambda Multiple Myeloma 11/10/2022: WBC 5.68, Hgb 11.7, MCV 97.4, Plt 146 04/17/2023: WBC 4.53, Hgb 11.5, MCV 107.6, Plt 110 05/16/2023: establish care with Dr. Federico. Labs showed M protein 0.1 with Lambda 900, Kappa 3 with ratio 0.00.   06/01/2023: bone marrow biopsy performed, showed plasma cell myeloma nearly effacing approximately 80% of the cellular marrow.  06/25/2023: Cycle 1 Day 1 of VRD chemotherapy 07/23/2023: Cycle 2 Day 1 of VRD chemotherapy 08/13/2023: Cycle 3 Day 1 of VRD chemotherapy 09/03/2023: Cycle 1 Day 1 of Dara/Rev/Dex. 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 12/17/2023: Cycle 1 Day 1 of Kyprolis . Dara/Rev ineffective.   Interval History:  Brooke Whitaker 73 y.o. female with medical history significant for newly diagnosed multiple myeloma who presents for a follow up visit. The patient's last visit was on 06/02/2024. In the interim since the last visit she has had no major changes in her health.  On exam today Brooke Whitaker reports she tolerated her last treatment well but did unfortunately develop her usual headache and increased blood pressure.  She reports that she was fine on Friday night but Saturday had trouble getting out of bed.  She reports that she only took 20 mg of the steroid this morning.  She reports she is not having any numbness or tingling of her fingers and toes.  She reports that she has developed a red spot on her bottom which is nontender but itches a little.  It is not been raised.  She is eating well with no nausea, vomiting, or diarrhea.  She reports that she is doing her best try to watch her sodium due to her blood pressure spikes.  Additionally she has had no trouble with fevers, chills, sweats.  She notes that she got her flu shot last week.   Otherwise she has no questions concerns or complaints today.  A full 10 point ROS is otherwise negative.  Previously we had additional discussion with her son regarding options moving forward.  We discussed the slight decline in her lambda light chains which is reassuring.   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    Sigmoid diverticulosis    Wears glasses     SURGICAL HISTORY: Past Surgical History:  Procedure Laterality Date   BREAST BIOPSY Right 09/2011   benign    COLONOSCOPY  2010   CYSTOSCOPY/RETROGRADE/URETEROSCOPY/STONE EXTRACTION WITH BASKET Left 04/21/2014   Procedure: cystoscopy, left retrograde pylegram, LEFT URETEROSCOPY, laser lithotripsy with holmium laser, STONE EXTRACTION, uretheral calibration;  Surgeon: Norleen JINNY Seltzer, MD;  Location: [facility];  Service: Urology;  Laterality: Left;   DILATION AND EVACUATION  1995   EPISIOTOMY REPAIR, SEVERE TEAR  1989   IR IMAGING GUIDED PORT INSERTION  02/04/2024   LAPAROSCOPIC OVARIAN CYSTECTOMY  10/ 2000   NM MYOCAR PERF WALL MOTION  12/15/2009   Normal   US  ECHOCARDIOGRAPHY  12/15/2009   Normal study for age: mild MR,TR and trace PI    SOCIAL HISTORY: Social History   Socioeconomic History   Marital status: Married    Spouse name: Not  on file   Number of children: 2   Years of education: Not on file   Highest education level: Not on file  Occupational History   Occupation: Print production planner  Tobacco Use   Smoking status: Never    Passive exposure: Never   Smokeless tobacco: Never  Vaping Use   Vaping status: Never Used  Substance and Sexual Activity   Alcohol  use: Yes    Alcohol /week: 2.0 standard drinks of alcohol     Types: 2 Glasses of wine per week   Drug use: No   Sexual activity: Not Currently    Partners: Male   Other Topics Concern   Not on file  Social History Narrative   Not on file   Social Drivers of Health   Financial Resource Strain: Not on file  Food Insecurity: No Food Insecurity (05/16/2023)   Hunger Vital Sign    Worried About Running Out of Food in the Last Year: Never true    Ran Out of Food in the Last Year: Never true  Transportation Needs: No Transportation Needs (05/16/2023)   PRAPARE - Administrator, Civil Service (Medical): No    Lack of Transportation (Non-Medical): No  Physical Activity: Not on file  Stress: Not on file  Social Connections: Not on file  Intimate Partner Violence: Not At Risk (05/16/2023)   Humiliation, Afraid, Rape, and Kick questionnaire    Fear of Current or Ex-Partner: No    Emotionally Abused: No    Physically Abused: No    Sexually Abused: No    FAMILY HISTORY: Family History  Problem Relation Age of Onset   Diabetes Mother    Congestive Heart Failure Mother    Stroke Mother    Osteoporosis Mother    Other Mother        gallbladder ruptured   Heart attack Father    Diabetes Sister    Heart disease Sister    Heart disease Sister        x2   Hypertension Sister    Thyroid  disease Sister    Hypertension Sister    Diabetes Brother    Dementia Brother    Colon cancer Brother        mets to liver   Heart disease Brother        pacemaker   Stroke Brother    Liver cancer Brother    Heart disease Brother    Stomach cancer Neg Hx    Pancreatic cancer Neg Hx    Esophageal cancer Neg Hx     ALLERGIES:  is allergic to penicillins and sulfa antibiotics.  MEDICATIONS:  Current Outpatient Medications  Medication Sig Dispense Refill   acetaminophen  (TYLENOL ) 500 MG tablet Take 1,000 mg by mouth every 6 (six) hours as needed for mild pain (pain score 1-3) or headache.     acyclovir  (ZOVIRAX ) 400 MG tablet Take 400 mg by mouth in the morning.     ALPRAZolam (XANAX) 0.25 MG tablet Take 0.25 mg by mouth at bedtime as needed.      carfilzomib  (KYPROLIS ) 10 MG SOLR as directed Intravenous clarify dose. infusion is every monday 3 times a month with a break one week each month. long term med     Cholecalciferol 25 MCG (1000 UT) capsule Take 1,000 Units by mouth daily.     Cyanocobalamin  (VITAMIN B 12 PO) Take 1,000 mcg by mouth daily.     dexamethasone  (DECADRON ) 4 MG tablet Take 10 tablets (40 mg total) by  mouth once a week. Take once weekly on the day you receive the chemotherapy injection. 40 tablet 5   fluticasone (FLONASE) 50 MCG/ACT nasal spray Place 2 sprays into both nostrils as needed for rhinitis or allergies.     hydrALAZINE  (APRESOLINE ) 25 MG tablet Take 25 mg three times a day as needed for systolic Blood pressure greater than 160     HYDROcodone -acetaminophen  (NORCO/VICODIN) 5-325 MG tablet Take 1 tablet by mouth every 6 (six) hours as needed for moderate pain (pain score 4-6). 21 tablet 0   levocetirizine (XYZAL) 5 MG tablet Take 5 mg by mouth at bedtime.     lidocaine -prilocaine  (EMLA ) cream Apply 1 Application topically as needed. (Patient taking differently: Apply 1 Application topically every Monday.) 30 g 1   ondansetron  (ZOFRAN ) 8 MG tablet Take 1 tablet (8 mg total) by mouth every 8 (eight) hours as needed. 30 tablet 0   Polyethyl Glyc-Propyl Glyc PF (SYSTANE PRESERVATIVE FREE) 0.4-0.3 % SOLN Place 1-2 drops into both eyes daily as needed (dry eyes).     prochlorperazine  (COMPAZINE ) 10 MG tablet Take 1 tablet (10 mg total) by mouth every 6 (six) hours as needed for nausea or vomiting. 30 tablet 0   rosuvastatin (CRESTOR) 5 MG tablet Take 5 mg by mouth daily.     telmisartan (MICARDIS) 80 MG tablet Take 1 tablet (80 mg total) by mouth daily.     triamcinolone  (NASACORT  ALLERGY 24HR) 55 MCG/ACT AERO nasal inhaler Place 2 sprays into the nose daily.     No current facility-administered medications for this visit.    REVIEW OF SYSTEMS:   Constitutional: ( - ) fevers, ( - )  chills , ( - ) night  sweats Eyes: ( - ) blurriness of vision, ( - ) double vision, ( - ) watery eyes Ears, nose, mouth, throat, and face: ( - ) mucositis, ( - ) sore throat Respiratory: ( - ) cough, ( - ) dyspnea, ( - ) wheezes Cardiovascular: ( - ) palpitation, ( - ) chest discomfort, ( - ) lower extremity swelling Gastrointestinal:  ( - ) nausea, ( - ) heartburn, ( - ) change in bowel habits Skin: ( - ) abnormal skin rashes Lymphatics: ( - ) new lymphadenopathy, ( - ) easy bruising Neurological: ( - ) numbness, ( - ) tingling, ( - ) new weaknesses Behavioral/Psych: ( - ) mood change, ( - ) new changes  All other systems were reviewed with the patient and are negative.  PHYSICAL EXAMINATION: ECOG PERFORMANCE STATUS: 0 - Asymptomatic  Vitals:   06/30/24 1343  BP: (!) 142/62  Pulse: 73  Resp: 14  Temp: 98.1 F (36.7 C)  SpO2: 99%    Filed Weights   06/30/24 1343  Weight: 142 lb 3.2 oz (64.5 kg)     GENERAL: Well-appearing elderly Caucasian female, alert, no distress and comfortable SKIN: skin color, texture, turgor are normal, no rashes or significant lesions EYES: conjunctiva are pink and non-injected, sclera clear LUNGS: clear to auscultation and percussion with normal breathing effort HEART: regular rate & rhythm and no murmurs and no lower extremity edema Musculoskeletal: no cyanosis of digits and no clubbing  PSYCH: alert & oriented x 3, fluent speech NEURO: no focal motor/sensory deficits  LABORATORY DATA:  I have reviewed the data as listed    Latest Ref Rng & Units 06/30/2024    1:17 PM 06/17/2024   11:25 AM 06/09/2024    1:51 PM  CBC  WBC 4.0 -  10.5 K/uL 9.4  9.1  8.2   Hemoglobin 12.0 - 15.0 g/dL 86.8  86.8  86.5   Hematocrit 36.0 - 46.0 % 39.4  39.9  40.3   Platelets 150 - 400 K/uL 223  187  125        Latest Ref Rng & Units 06/30/2024    1:17 PM 06/17/2024   11:25 AM 06/09/2024    1:51 PM  CMP  Glucose 70 - 99 mg/dL 885  897  868   BUN 8 - 23 mg/dL 16  15  19    Creatinine  0.44 - 1.00 mg/dL 9.37  9.43  9.49   Sodium 135 - 145 mmol/L 141  143  140   Potassium 3.5 - 5.1 mmol/L 4.3  4.2  4.3   Chloride 98 - 111 mmol/L 108  109  107   CO2 22 - 32 mmol/L 27  28  27    Calcium 8.9 - 10.3 mg/dL 8.8  8.9  8.9   Total Protein 6.5 - 8.1 g/dL 6.0  5.8  5.9   Total Bilirubin 0.0 - 1.2 mg/dL 1.3  1.5  1.5   Alkaline Phos 38 - 126 U/L 81  87  77   AST 15 - 41 U/L 13  15  12    ALT 0 - 44 U/L 13  17  11      Lab Results  Component Value Date   MPROTEIN Not Observed 06/30/2024   MPROTEIN Not Observed 06/02/2024   MPROTEIN Not Observed 05/05/2024   Lab Results  Component Value Date   KPAFRELGTCHN 1.4 (L) 06/30/2024   KPAFRELGTCHN 1.3 (L) 06/02/2024   KPAFRELGTCHN 1.4 (L) 05/05/2024   LAMBDASER 53.6 (H) 06/30/2024   LAMBDASER 46.3 (H) 06/02/2024   LAMBDASER 54.2 (H) 05/05/2024   KAPLAMBRATIO 0.03 (L) 06/30/2024   KAPLAMBRATIO 0.03 (L) 06/02/2024   KAPLAMBRATIO 0.03 (L) 05/05/2024    RADIOGRAPHIC STUDIES: CT VENOGRAM HEAD Result Date: 06/07/2024 CLINICAL DATA:  Dural venous sinus thrombosis suspected Multiple myeloma with blood pressure over 200 with 10 out of 10 pain in upper part of her neck, head with some nausea. Sent here for workup and imaging. Rule out dural venous sinus thrombus or dissection or bleed EXAM: CT VENOGRAM HEAD TECHNIQUE: Venographic phase images of the brain were obtained following the administration of intravenous contrast. Multiplanar reformats and maximum intensity projections were generated. RADIATION DOSE REDUCTION: This exam was performed according to the departmental dose-optimization program which includes automated exposure control, adjustment of the mA and/or kV according to patient size and/or use of iterative reconstruction technique. CONTRAST:  75mL OMNIPAQUE  IOHEXOL  350 MG/ML SOLN COMPARISON:  None Available. FINDINGS: No evidence of dural venous sinus thrombosis. Specifically, the superior sagittal, straight, sigmoid, and transverse  sinuses are patent. Symmetric cavernous sinus opacification. IMPRESSION: No evidence of dural venous sinus thrombosis. Electronically Signed   By: Gilmore GORMAN Molt M.D.   On: 06/07/2024 00:05   CT ANGIO HEAD NECK W WO CM Result Date: 06/07/2024 CLINICAL DATA:  Vertebral artery dissection suspected Multiple myeloma with blood pressure over 200 with 10 out of 10 pain in upper part of her neck, head with some nausea. Sent here for workup and imaging. Rule out dural venous sinus thrombus or dissection or bleed EXAM: CT ANGIOGRAPHY HEAD AND NECK WITH AND WITHOUT CONTRAST TECHNIQUE: Multidetector CT imaging of the head and neck was performed using the standard protocol during bolus administration of intravenous contrast. Multiplanar CT image reconstructions and MIPs were obtained to evaluate the  vascular anatomy. Carotid stenosis measurements (when applicable) are obtained utilizing NASCET criteria, using the distal internal carotid diameter as the denominator. RADIATION DOSE REDUCTION: This exam was performed according to the departmental dose-optimization program which includes automated exposure control, adjustment of the mA and/or kV according to patient size and/or use of iterative reconstruction technique. CONTRAST:  75mL OMNIPAQUE  IOHEXOL  350 MG/ML SOLN COMPARISON:  None Available. FINDINGS: CT HEAD FINDINGS Brain: No evidence of acute infarction, hemorrhage, hydrocephalus, extra-axial collection or mass lesion/mass effect. Patchy white matter hypodensities are nonspecific but compatible with chronic microvascular ischemic disease. Vascular: See below. Skull: No acute fracture. Sinuses/Orbits: Clear sinuses.  No acute orbital findings. Other: No mastoid effusions. Review of the MIP images confirms the above findings CTA NECK FINDINGS Aortic arch: Calcific atherosclerosis. Great vessel origins are patent. Right carotid system: No evidence of dissection, stenosis (50% or greater), or occlusion. Left carotid  system: No evidence of dissection, stenosis (50% or greater), or occlusion. Vertebral arteries: Right dominant. No evidence of dissection, stenosis (50% or greater), or occlusion. Skeleton: No evidence of acute abnormality limited assessment. Other neck: Thyroid  nodules measuring up to 1.6 cm. Upper chest: Lung apices are clear. Review of the MIP images confirms the above findings CTA HEAD FINDINGS Anterior circulation: Bilateral intracranial ICAs, MCAs, and ACAs are patent without proximal hemodynamically significant stenosis. Multifocal mild atherosclerotic irregularity and narrowing. Posterior circulation: Bilateral intradural vertebral arteries, basilar artery and bilateral posterior arteries are patent without proximal hemodynamically significant stenosis. Multifocal mild atherosclerotic irregularity and narrowing. Venous sinuses: As permitted by contrast timing, patent. Review of the MIP images confirms the above findings IMPRESSION: 1. No evidence of acute intracranial abnormality. 2. No large vessel occlusion or proximal hemodynamically significant stenosis. 3. No evidence of dissection. 4. Thyroid  nodules measuring up to 1.6 cm. Recommend thyroid  USxcsadd (ref: J Am Coll Radiol. 2015 Feb;12(2): 143-50). Electronically Signed   By: Gilmore GORMAN Molt M.D.   On: 06/07/2024 00:04     ASSESSMENT & PLAN Brooke Whitaker 74 y.o. female with medical history significant for newly diagnosed multiple myeloma who presents for a follow up visit.   Previously we discussed the diagnosis of multiple myeloma.  We discussed that this is a blood cancer that is not curable.  The goal is to get the patient into a durable remission with treatment.  We discussed the need for a minimum of 6 months of chemotherapy treatment then followed by maintenance therapy if the M protein has reached 0 and the free light chains are within normal limits.  We discussed expected side effects of the regimen and the plan moving forward.  The  patient voiced her understanding of our findings, expected side effects, and risks/benefits.  She is okay with proceeding with chemotherapy at this time.  # Free Lambda Multiple Myeloma -- Diagnosis confirmed by the presence of a macrocytic anemia and 80% plasma cells in the bone marrow --Initially started with VRD chemotherapy with the patient developed rash as result of the Revlimid  and ocular toxicity from the Velcade .  Dropped the Revlimid  down to 10 mg p.o. daily and transition to Darzalex  for her next treatment. --UPEP showed marked Bence Jones proteins in the urine, no evidence of lytic lesions on metastatic survey PLAN: -- Continue Kyprolis /Dex therapy.  Today is Cycle 8 Day 1 --Labs today show white blood cell 9.4, Hgb 13.1, MCV 96.6, Plt 223.  LFTs and creatinine within normal limits.  --On 06/02/2024 patient had kappa 1.4, lambda 53.6, ratio 0.03 with an M  protein undetectable. -- Offer the addition of Pomalyst but the patient declined and want to continue on Kyprolis  alone.  She is aware of the rising lambda chains. -- Plan for return to clinic in 2 weeks with interval continued Kyprolis  therapy.  #Supportive Care -- chemotherapy education complete -- port placed -- zofran  8mg  q8H PRN and compazine  10mg  PO q6H for nausea -- acyclovir  400mg  PO BID for VCZ prophylaxis -- EMLA  cream for port -- Zometa to be added after dental clearance.  -- no pain medication required at this time.   Orders Placed This Encounter  Procedures   Multiple Myeloma Panel (SPEP&IFE w/QIG)    Standing Status:   Future    Expected Date:   08/25/2024    Expiration Date:   08/25/2025   Kappa/lambda light chains    Standing Status:   Future    Expected Date:   08/25/2024    Expiration Date:   08/25/2025   CBC with Differential (Cancer Center Only)    Standing Status:   Future    Expected Date:   08/25/2024    Expiration Date:   08/25/2025   CMP (Cancer Center only)    Standing Status:   Future     Expected Date:   08/25/2024    Expiration Date:   08/25/2025   CBC with Differential (Cancer Center Only)    Standing Status:   Future    Expected Date:   09/01/2024    Expiration Date:   09/01/2025   CMP (Cancer Center only)    Standing Status:   Future    Expected Date:   09/01/2024    Expiration Date:   09/01/2025   CBC with Differential (Cancer Center Only)    Standing Status:   Future    Expected Date:   09/08/2024    Expiration Date:   09/08/2025   CMP (Cancer Center only)    Standing Status:   Future    Expected Date:   09/08/2024    Expiration Date:   09/08/2025   All questions were answered. The patient knows to call the clinic with any problems, questions or concerns.  A total of more than 30 minutes were spent on this encounter with face-to-face time and non-face-to-face time, including preparing to see the patient, ordering tests and/or medications, counseling the patient and coordination of care as outlined above.   Norleen IVAR Kidney, MD Department of Hematology/Oncology [facility] Phone: [phone] Pager: [phone] Email: [email or web address]  07/05/2024 11:08 AM

## 2024-07-01 LAB — KAPPA/LAMBDA LIGHT CHAINS
Kappa free light chain: 1.4 mg/L — ABNORMAL LOW (ref 3.3–19.4)
Kappa, lambda light chain ratio: 0.03 — ABNORMAL LOW (ref 0.26–1.65)
Lambda free light chains: 53.6 mg/L — ABNORMAL HIGH (ref 5.7–26.3)

## 2024-07-02 ENCOUNTER — Other Ambulatory Visit: Payer: Self-pay

## 2024-07-02 LAB — MULTIPLE MYELOMA PANEL, SERUM
Albumin SerPl Elph-Mcnc: 3.6 g/dL (ref 2.9–4.4)
Albumin/Glob SerPl: 1.9 — ABNORMAL HIGH (ref 0.7–1.7)
Alpha 1: 0.3 g/dL (ref 0.0–0.4)
Alpha2 Glob SerPl Elph-Mcnc: 0.8 g/dL (ref 0.4–1.0)
B-Globulin SerPl Elph-Mcnc: 0.9 g/dL (ref 0.7–1.3)
Gamma Glob SerPl Elph-Mcnc: 0.1 g/dL — ABNORMAL LOW (ref 0.4–1.8)
Globulin, Total: 2 g/dL — ABNORMAL LOW (ref 2.2–3.9)
IgA: <5 mg/dL — ABNORMAL LOW (ref 64–422)
IgG (Immunoglobin G), Serum: 151 mg/dL — ABNORMAL LOW (ref 586–1602)
IgM (Immunoglobulin M), Srm: <5 mg/dL — ABNORMAL LOW (ref 26–217)
Total Protein ELP: 5.6 g/dL — ABNORMAL LOW (ref 6.0–8.5)

## 2024-07-03 DIAGNOSIS — L304 Erythema intertrigo: Secondary | ICD-10-CM | POA: Diagnosis not present

## 2024-07-03 DIAGNOSIS — L918 Other hypertrophic disorders of the skin: Secondary | ICD-10-CM | POA: Diagnosis not present

## 2024-07-04 ENCOUNTER — Telehealth: Payer: Self-pay | Admitting: Cardiovascular Disease

## 2024-07-04 DIAGNOSIS — I10 Essential (primary) hypertension: Secondary | ICD-10-CM

## 2024-07-04 NOTE — Telephone Encounter (Signed)
 Pt c/o BP issue: STAT if pt c/o blurred vision, one-sided weakness or slurred speech.  STAT if BP is GREATER than 180/120 TODAY.  STAT if BP is LESS than 90/60 and SYMPTOMATIC TODAY  1. What is your BP concern? High Blood Pressure episodes. Patient stated her BP was high last night and this morning. Patient stated another provider was able to prescribe medication to bring her BP down. Patient would like a referral to Dr. Raford for the HTN Clinic.  2. Have you taken any BP medication today? Yes   3. What are your last 5 BP readings? 132/80 right now and  last night 193/96 2:30 AM    4. Are you having any other symptoms (ex. Dizziness, headache, blurred vision, passed out)? Headaches felt like head would explode

## 2024-07-04 NOTE — Telephone Encounter (Signed)
 Patient would like a referral to HTN clinic with Dr. Raford. Will forward to Dr. Court.

## 2024-07-05 ENCOUNTER — Encounter: Payer: Self-pay | Admitting: Hematology and Oncology

## 2024-07-07 ENCOUNTER — Inpatient Hospital Stay

## 2024-07-07 VITALS — BP 116/59 | HR 93 | Temp 98.4°F | Resp 16 | Wt 139.0 lb

## 2024-07-07 DIAGNOSIS — C9000 Multiple myeloma not having achieved remission: Secondary | ICD-10-CM

## 2024-07-07 DIAGNOSIS — Z5112 Encounter for antineoplastic immunotherapy: Secondary | ICD-10-CM | POA: Diagnosis not present

## 2024-07-07 LAB — CMP (CANCER CENTER ONLY)
ALT: 13 U/L (ref 0–44)
AST: 14 U/L — ABNORMAL LOW (ref 15–41)
Albumin: 4.3 g/dL (ref 3.5–5.0)
Alkaline Phosphatase: 79 U/L (ref 38–126)
Anion gap: 8 (ref 5–15)
BUN: 18 mg/dL (ref 8–23)
CO2: 27 mmol/L (ref 22–32)
Calcium: 9.1 mg/dL (ref 8.9–10.3)
Chloride: 104 mmol/L (ref 98–111)
Creatinine: 0.7 mg/dL (ref 0.44–1.00)
GFR, Estimated: >60 mL/min (ref >60)
Glucose, Bld: 219 mg/dL — ABNORMAL HIGH (ref 70–99)
Potassium: 4.1 mmol/L (ref 3.5–5.1)
Sodium: 139 mmol/L (ref 135–145)
Total Bilirubin: 1.2 mg/dL (ref 0.0–1.2)
Total Protein: 6.3 g/dL — ABNORMAL LOW (ref 6.5–8.1)

## 2024-07-07 LAB — CBC WITH DIFFERENTIAL (CANCER CENTER ONLY)
Abs Immature Granulocytes: 0.05 K/uL (ref 0.00–0.07)
Basophils Absolute: 0 K/uL (ref 0.0–0.1)
Basophils Relative: 0 %
Eosinophils Absolute: 0 K/uL (ref 0.0–0.5)
Eosinophils Relative: 0 %
HCT: 41.6 % (ref 36.0–46.0)
Hemoglobin: 13.9 g/dL (ref 12.0–15.0)
Immature Granulocytes: 1 %
Lymphocytes Relative: 7 %
Lymphs Abs: 0.6 K/uL — ABNORMAL LOW (ref 0.7–4.0)
MCH: 32.4 pg (ref 26.0–34.0)
MCHC: 33.4 g/dL (ref 30.0–36.0)
MCV: 97 fL (ref 80.0–100.0)
Monocytes Absolute: 0.1 K/uL (ref 0.1–1.0)
Monocytes Relative: 1 %
Neutro Abs: 8.6 K/uL — ABNORMAL HIGH (ref 1.7–7.7)
Neutrophils Relative %: 91 %
Platelet Count: 151 K/uL (ref 150–400)
RBC: 4.29 MIL/uL (ref 3.87–5.11)
RDW: 12.9 % (ref 11.5–15.5)
WBC Count: 9.4 K/uL (ref 4.0–10.5)
nRBC: 0 % (ref 0.0–0.2)

## 2024-07-07 MED ORDER — SODIUM CHLORIDE 0.9 % IV SOLN: Freq: Once | INTRAVENOUS | Status: AC

## 2024-07-07 MED ORDER — DEXTROSE 5 % IV SOLN
70.0000 mg/m2 | Freq: Once | INTRAVENOUS | Status: AC
  Administered 2024-07-07: 120 mg via INTRAVENOUS
  Filled 2024-07-07: qty 60

## 2024-07-07 MED ORDER — SODIUM CHLORIDE 0.9 % IV SOLN: INTRAVENOUS | Status: DC

## 2024-07-07 NOTE — Telephone Encounter (Signed)
 Order placed for HTN Clinic with Dr Raford.

## 2024-07-07 NOTE — Patient Instructions (Signed)
 [facility] - A DEPT OF [facility]  Discharge Instructions: Thank you for choosing [facility] to provide your oncology and hematology care.   If you have a lab appointment with the Cancer Center, please go directly to the Cancer Center and check in at the registration area.   Wear comfortable clothing and clothing appropriate for easy access to any Portacath or PICC line.   We strive to give you quality time with your provider. You may need to reschedule your appointment if you arrive late (15 or more minutes).  Arriving late affects you and other patients whose appointments are after yours.  Also, if you miss three or more appointments without notifying the office, you may be dismissed from the clinic at the provider's discretion.      For prescription refill requests, have your pharmacy contact our office and allow 72 hours for refills to be completed.    Today you received the following chemotherapy and/or immunotherapy agent: Carfilzomib (Kyprolis)      To help prevent nausea and vomiting after your treatment, we encourage you to take your nausea medication as directed.  BELOW ARE SYMPTOMS THAT SHOULD BE REPORTED IMMEDIATELY: *FEVER GREATER THAN 100.4 F (38 C) OR HIGHER *CHILLS OR SWEATING *NAUSEA AND VOMITING THAT IS NOT CONTROLLED WITH YOUR NAUSEA MEDICATION *UNUSUAL SHORTNESS OF BREATH *UNUSUAL BRUISING OR BLEEDING *URINARY PROBLEMS (pain or burning when urinating, or frequent urination) *BOWEL PROBLEMS (unusual diarrhea, constipation, pain near the anus) TENDERNESS IN MOUTH AND THROAT WITH OR WITHOUT PRESENCE OF ULCERS (sore throat, sores in mouth, or a toothache) UNUSUAL RASH, SWELLING OR PAIN  UNUSUAL VAGINAL DISCHARGE OR ITCHING   Items with * indicate a potential emergency and should be followed up as soon as possible or go to the Emergency Department if any problems should occur.  Please show the CHEMOTHERAPY ALERT CARD or  IMMUNOTHERAPY ALERT CARD at check-in to the Emergency Department and triage nurse.  Should you have questions after your visit or need to cancel or reschedule your appointment, please contact [facility] - [facility]  Dept: [phone]  and follow the prompts.  Office hours are 8:00 a.m. to 4:30 p.m. Monday - Friday. Please note that voicemails left after 4:00 p.m. may not be returned until the following business day.  We are closed weekends and major holidays. You have access to a nurse at all times for urgent questions. Please call the main number to the clinic Dept: [phone] and follow the prompts.   For any non-urgent questions, you may also contact your provider using MyChart. We now offer e-Visits for anyone 81 and older to request care online for non-urgent symptoms. For details visit [email or web address].   Also download the MyChart app! Go to the app store, search "MyChart", open the app, select [place], and log in with your MyChart username and password.

## 2024-07-07 NOTE — Progress Notes (Signed)
Pt took dexamethasone at home

## 2024-07-08 DIAGNOSIS — J302 Other seasonal allergic rhinitis: Secondary | ICD-10-CM | POA: Diagnosis not present

## 2024-07-08 DIAGNOSIS — I10 Essential (primary) hypertension: Secondary | ICD-10-CM | POA: Diagnosis not present

## 2024-07-08 DIAGNOSIS — C9000 Multiple myeloma not having achieved remission: Secondary | ICD-10-CM | POA: Diagnosis not present

## 2024-07-09 ENCOUNTER — Encounter (HOSPITAL_BASED_OUTPATIENT_CLINIC_OR_DEPARTMENT_OTHER): Payer: Self-pay

## 2024-07-10 ENCOUNTER — Encounter (HOSPITAL_BASED_OUTPATIENT_CLINIC_OR_DEPARTMENT_OTHER): Payer: Self-pay | Admitting: Family

## 2024-07-10 ENCOUNTER — Encounter (HOSPITAL_BASED_OUTPATIENT_CLINIC_OR_DEPARTMENT_OTHER): Payer: Self-pay

## 2024-07-10 ENCOUNTER — Ambulatory Visit (HOSPITAL_BASED_OUTPATIENT_CLINIC_OR_DEPARTMENT_OTHER): Admitting: Family

## 2024-07-10 VITALS — BP 102/66 | HR 90 | Resp 17 | Ht 61.0 in | Wt 139.0 lb

## 2024-07-10 DIAGNOSIS — I10 Essential (primary) hypertension: Secondary | ICD-10-CM

## 2024-07-10 DIAGNOSIS — R400 Somnolence: Secondary | ICD-10-CM | POA: Diagnosis not present

## 2024-07-10 DIAGNOSIS — R0683 Snoring: Secondary | ICD-10-CM | POA: Diagnosis not present

## 2024-07-10 MED ORDER — HYDRALAZINE HCL 50 MG PO TABS: ORAL_TABLET | ORAL | 5 refills | Status: AC

## 2024-07-10 NOTE — Patient Instructions (Signed)
 Medication Instructions:   CHANGE YOUR HYDRALAZINE  TO 50 MG BY MOUTH EVERY 4 HOURS AS NEEDED FOR SYSTOLIC BP GREATER THAN 150  *If you need a refill on your cardiac medications before your next appointment, please call your pharmacy*   Lab Work:  PLEASE HAVE YOUR LAB DONE AT CANCER CENTER ON NEXT MONDAY--Aldosterone + renin activity w/ ratio   If you have labs (blood work) drawn today and your tests are completely normal, you will receive your results only by: MyChart Message (if you have MyChart) OR A paper copy in the mail If you have any lab test that is abnormal or we need to change your treatment, we will call you to review the results.   Testing/Procedures:  WatchPAT?  Is a FDA cleared portable home sleep study test that uses a watch and 3 points of contact to monitor 7 different channels, including your heart rate, oxygen saturations, body position, snoring, and chest motion.  The study is easy to use from the comfort of your own home and accurately detect sleep apnea.  Before bed, you attach the chest sensor, attached the sleep apnea bracelet to your nondominant hand, and attach the finger probe.  After the study, the raw data is downloaded from the watch and scored for apnea events.   For more information: [email or web address].   We have ordered this study and once approved by insurance it will be mailed to your home.     Follow-Up:  Please follow up in 6-8 WEEKS  in [facility] with Dr. Raford, Reche Finder, NP or Allean Mink PharmD

## 2024-07-10 NOTE — Progress Notes (Unsigned)
 [facility] Initial Assessment:    Date:  07/11/2024   ID:  Brooke Whitaker, DOB Oct 21, 1950, MRN [id number]  PCP:  Shayne Anes, MD  Cardiologist:  None  Nephrologist:  Referring MD: Court Dorn PARAS, MD   CC: Hypertension  History of Present Illness:    Brooke Whitaker is a 73 y.o. female with a hx of hyperlipidemia, hypertension, multiple myeloma, aortic atherosclerosis here to establish care in the [facility].   Family history of coronary artery disease with father having first MI at age 73 and died of MI at 33.  Sister who died suddenly of MI at 38.  Initial cardiac evaluation 2011 with normal echo and Myoview .  Calcium score 12/2020 of 0.  Echo 01/24/24 normal LVEF 60 to 65%, no RWMA, RV normal, mild MR.  She was last seen in/10/25 by Dr. Court.  She was noted to have spikes in her blood pressure several days after receiving steroids for her multiple myeloma.  She was using as needed hydralazine  for blood pressure spikes and otherwise taking daily telmisartan.  Rock [name] was diagnosed with hypertension around the time she was diagnosed with multiple myeloma. For the last 6 weeks on day 3 of her treatment starts getting headache, high blood pressure with no resolution. She has been on daily Telmisartan for 4 months. She was in ED 06/06/24 and given PRN Hydralazine  which she does not note that it does much. New multiple myeloma regimen in April with carfilzomib . Her oncology team did have her back off on prednisone 2 weeks ago without significant improvement in blood pressure. Did note slight increase in her dyspneawith reduced dose Prednisone. Blood pressure checked with arm cuff at home. Home cuff previously verified by primary care.  she reports tobacco use never. Alcohol  use rarely, socially. For exercise she likes to walk but has not done so recently due to weather and her treatments. she eats at home and does follow low sodium diet.   Takes her  telmisartan in the evening. Last Libby was started on Lasix 20mg  daily and potassium 10 mEq daily. Dr. Perini added to see if it would help with blood pressure.  Did have episode of flutters and palpitations last weekend which lasted for 24 hours. Has not recurred. Drinks water  during the day and one cup of decaffeinated coffee.   Prior testing:  MRI abdomen 05/2022 normal adrenal gland. 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;  Service: Urology;  Laterality: Left;   DILATION AND EVACUATION  1995   EPISIOTOMY REPAIR, SEVERE TEAR  1989   IR IMAGING GUIDED PORT INSERTION  02/04/2024   LAPAROSCOPIC OVARIAN CYSTECTOMY  10/ 2000   NM MYOCAR PERF WALL MOTION  12/15/2009   Normal   US  ECHOCARDIOGRAPHY  12/15/2009   Normal study for age: mild MR,TR and trace PI    Current Medications: Current Meds  Medication Sig   acetaminophen  (TYLENOL ) 500 MG tablet Take 1,000 mg by mouth every 6 (six) hours as needed for mild pain (pain score 1-3) or headache.   acyclovir  (ZOVIRAX ) 400 MG tablet Take 400 mg by mouth in the morning.   ALPRAZolam (XANAX) 0.25 MG tablet Take 0.25 mg by mouth at bedtime as needed.   carfilzomib  (KYPROLIS ) 10 MG SOLR as directed Intravenous clarify dose. infusion is every monday 3  times a month with a break one week each month. long term med   Cholecalciferol 25 MCG (1000 UT) capsule Take 1,000 Units by mouth daily.   Cyanocobalamin  (VITAMIN B 12 PO) Take 1,000 mcg by mouth daily.   dexamethasone  (DECADRON ) 4 MG tablet Take 10 tablets (40 mg total) by mouth once a week. Take once weekly on the day you receive the chemotherapy injection.   fluticasone (FLONASE) 50 MCG/ACT nasal spray Place 2 sprays into both nostrils as needed for rhinitis or allergies.   furosemide (LASIX) 20 MG tablet Take 20 mg by mouth daily.   hydrALAZINE  (APRESOLINE ) 50 MG tablet Take 1 tablet (50 mg total) by mouth every 4 hours as needed for SBP>150.   HYDROcodone -acetaminophen  (NORCO/VICODIN) 5-325 MG tablet Take 1 tablet by mouth every 6 (six) hours as needed for moderate pain (pain score 4-6).   levocetirizine (XYZAL) 5 MG tablet Take 5 mg by mouth at bedtime.   lidocaine -prilocaine  (EMLA ) cream Apply 1 Application topically as needed.   ondansetron  (ZOFRAN ) 8 MG tablet Take 1 tablet (8 mg total) by mouth every 8 (eight) hours as needed.   Polyethyl Glyc-Propyl Glyc PF (SYSTANE PRESERVATIVE FREE) 0.4-0.3 % SOLN Place 1-2 drops into both eyes daily as needed (dry eyes).   potassium chloride (KLOR-CON) 10 MEQ tablet Take 10 mEq by mouth daily.   prochlorperazine  (COMPAZINE ) 10 MG tablet Take 1 tablet (10 mg total) by mouth every 6 (six) hours as needed for nausea or vomiting.   rosuvastatin (CRESTOR) 5 MG tablet Take 5 mg by mouth daily.   telmisartan (MICARDIS) 80 MG tablet Take 1 tablet (80 mg total) by mouth daily.   triamcinolone  (NASACORT  ALLERGY 24HR) 55 MCG/ACT AERO nasal inhaler Place 2 sprays into the nose daily.   [DISCONTINUED] hydrALAZINE  (APRESOLINE ) 25 MG tablet Take 25 mg three times a day as needed for systolic Blood pressure greater than 160     Allergies:   Penicillins and Sulfa antibiotics   Social History   Socioeconomic History   Marital status: Married    Spouse name:  Not on file   Number of children: 2   Years of education: Not on file   Highest education level: Not on file  Occupational History   Occupation: Print production planner  Tobacco Use   Smoking status: Never    Passive exposure: Never   Smokeless tobacco: Never  Vaping Use   Vaping status: Never Used  Substance and Sexual Activity   Alcohol  use: Yes    Alcohol /week: 2.0 standard drinks of alcohol     Types: 2 Glasses of wine per week   Drug use: No   Sexual activity: Not Currently    Partners: Male  Other Topics Concern   Not on file  Social History Narrative   Not on file   Social Drivers of Health   Financial Resource Strain: Not on file  Food Insecurity: No Food Insecurity (05/16/2023)  Hunger Vital Sign    Worried About Running Out of Food in the Last Year: Never true    Ran Out of Food in the Last Year: Never true  Transportation Needs: No Transportation Needs (05/16/2023)   PRAPARE - Administrator, Civil Service (Medical): No    Lack of Transportation (Non-Medical): No  Physical Activity: Not on file  Stress: Not on file  Social Connections: Not on file     Family History: The patient's family history includes Colon cancer in her brother; Congestive Heart Failure in her mother; Dementia in her brother; Diabetes in her brother, mother, and sister; Heart attack in her father and sister; Heart disease in her brother, brother, sister, and sister; Hypertension in her sister and sister; Liver cancer in her brother; Osteoporosis in her mother; Other in her mother; Stroke in her brother and mother; Thyroid  disease in her sister. There is no history of Stomach cancer, Pancreatic cancer, or Esophageal cancer.  ROS:   Please see the history of present illness.     All other systems reviewed and are negative.  EKGs/Labs/Other Studies Reviewed:         Recent Labs: 06/06/2024: TSH 2.876 07/07/2024: ALT 13; BUN 18; Creatinine 0.70; Hemoglobin 13.9; Platelet Count 151;  Potassium 4.1; Sodium 139   Recent Lipid Panel    Component Value Date/Time   CHOL 206 (H) 01/27/2016 1003   TRIG 51 01/27/2016 1003   HDL 93 01/27/2016 1003   CHOLHDL 2.2 01/27/2016 1003   VLDL 10 01/27/2016 1003   LDLCALC 103 01/27/2016 1003    Physical Exam:   VS:  BP 102/66 (BP Location: Right Arm, Patient Position: Sitting, Cuff Size: Normal)   Pulse 90   Resp 17   Ht 5' 1 (1.549 m)   Wt 139 lb (63 kg)   LMP 10/16/2001   SpO2 96%   BMI 26.26 kg/m  , BMI Body mass index is 26.26 kg/m. GENERAL:  Well appearing HEENT: Pupils equal round and reactive, fundi not visualized, oral mucosa unremarkable NECK:  No jugular venous distention, waveform within normal limits, carotid upstroke brisk and symmetric, no bruits, no thyromegaly LYMPHATICS:  No cervical adenopathy LUNGS:  Clear to auscultation bilaterally HEART:  RRR.  PMI not displaced or sustained,S1 and S2 within normal limits, no S3, no S4, no clicks, no rubs, no murmurs ABD:  Flat, positive bowel sounds normal in frequency in pitch, no bruits, no rebound, no guarding, no midline pulsatile mass, no hepatomegaly, no splenomegaly EXT:  2 plus pulses throughout, no edema, no cyanosis no clubbing SKIN:  No rashes no nodules NEURO:  Cranial nerves II through XII grossly intact, motor grossly intact throughout PSYCH:  Cognitively intact, oriented to person place and time   ASSESSMENT/PLAN:    HTN / Snoring / Daytime somnolence- labile hypertension associated with carfilzomib . She has infusion on Monday, starts having high BP Thursday, will start trending down on Saturday. She takes Telmisartan 80mg  at bedtime. Her Hydralazine  25mg  PRN does not seem to be helping. PCP start Lasix 20mg  daily and potassium last week. Her BP today in clinic is well controlled, usually would be escalating by this point in the week.  Continue Telmisartan 80mg  at bedtime and Lasix 20mg  daily. Lasix does seem to have improved BP. Monitor BP 1-2 x per  day and report back readings Monday via MyChart.  Adjust PRN Hydralazine  to 50mg  PRN q4h for SBP >150 If BP persistently difficult days 3-5 post treatment may need  low dose Amlodipine 5mg  daily on those days only.  Will ask oncology to collect renin-aldosterone ratio with upcoming labs for secondary hypertension workup Symptoms suggestive of sleep apnea (snoring, daytime somnolence), STOP Bang 4 - plan for Itamar home sleep study  HLD / Aortic atherosclerosis -  Stable with no anginal symptoms. No indication for ischemic evaluation.  GDMT rosuvastatin 5mg  daily.   Screening for Secondary Hypertension:     Relevant Labs/Studies:    Latest Ref Rng & Units 07/07/2024    2:02 PM 06/30/2024    1:17 PM 06/17/2024   11:25 AM  Basic Labs  Sodium 135 - 145 mmol/L 139  141  143   Potassium 3.5 - 5.1 mmol/L 4.1  4.3  4.2   Creatinine 0.44 - 1.00 mg/dL 9.29  9.37  9.43        Latest Ref Rng & Units 06/06/2024    7:54 PM 12/08/2014    8:51 AM  Thyroid    TSH 0.350 - 4.500 uIU/mL 2.876  4.551                  Disposition:    FU with MD/APP/PharmD in 6-8 weeks    Medication Adjustments/Labs and Tests Ordered: Current medicines are reviewed at length with the patient today.  Concerns regarding medicines are outlined above.  Orders Placed This Encounter  Procedures   Aldosterone + renin activity w/ ratio   Itamar Sleep Study   Meds ordered this encounter  Medications   hydrALAZINE  (APRESOLINE ) 50 MG tablet    Sig: Take 1 tablet (50 mg total) by mouth every 4 hours as needed for SBP>150.    Dispense:  30 tablet    Refill:  5    DOSE CHANGE     Signed, Reche GORMAN Finder, NP  07/11/2024 4:31 PM    [facility]

## 2024-07-11 ENCOUNTER — Encounter: Payer: Self-pay | Admitting: Hematology and Oncology

## 2024-07-13 ENCOUNTER — Other Ambulatory Visit: Payer: Self-pay | Admitting: Hematology and Oncology

## 2024-07-13 NOTE — Assessment & Plan Note (Addendum)
#   Free Lambda Multiple Myeloma 11/10/2022: WBC 5.68, Hgb 11.7, MCV 97.4, Plt 146 04/17/2023: WBC 4.53, Hgb 11.5, MCV 107.6, Plt 110 05/16/2023: establish care with Dr. Federico. Labs showed M protein 0.1 with Lambda 900, Kappa 3 with ratio 0.00.   06/01/2023: bone marrow biopsy performed, showed plasma cell myeloma nearly effacing approximately 80% of the cellular marrow.  06/25/2023: Cycle 1 Day 1 of VRD chemotherapy 07/23/2023: Cycle 2 Day 1 of VRD chemotherapy 08/13/2023: Cycle 3 Day 1 of VRD chemotherapy 09/03/2023: Cycle 1 Day 1 of Dara/Rev/Dex. 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 12/17/2023: Cycle 1 Day 1 of Kyprolis . Dara/Rev ineffective.  07/14/2024 -cycle 8 day 15 of Kyprolis  - tolerating well. Continue as scheduled

## 2024-07-13 NOTE — Progress Notes (Signed)
 Patient Care Team: Shayne Anes, MD as PCP - General (Internal Medicine)  Clinic Day:  07/14/2024  Referring physician: Shayne Anes, MD  ASSESSMENT & PLAN:   Assessment & Plan: Multiple myeloma [facility]) # Free Lambda Multiple Myeloma 11/10/2022: WBC 5.68, Hgb 11.7, MCV 97.4, Plt 146 04/17/2023: WBC 4.53, Hgb 11.5, MCV 107.6, Plt 110 05/16/2023: establish care with Dr. Federico. Labs showed M protein 0.1 with Lambda 900, Kappa 3 with ratio 0.00.   06/01/2023: bone marrow biopsy performed, showed plasma cell myeloma nearly effacing approximately 80% of the cellular marrow.  06/25/2023: Cycle 1 Day 1 of VRD chemotherapy 07/23/2023: Cycle 2 Day 1 of VRD chemotherapy 08/13/2023: Cycle 3 Day 1 of VRD chemotherapy 09/03/2023: Cycle 1 Day 1 of Dara/Rev/Dex. 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 12/17/2023: Cycle 1 Day 1 of Kyprolis . Dara/Rev ineffective.  07/14/2024 -cycle 8 day 15 of Kyprolis  - tolerating well. Continue as scheduled    Elevated blood pressure This is happening most often on the third day after chemotherapy treatment.  When blood pressure is elevated, she gets facial pressure.  She has also had palpitations and increased heart rate.  Her primary care has added furosemide and potassium to help with blood pressure control.  They have asked for us  to obtain aldosterone and renin activity test.  Those were ordered and came back with normal results.  Both blood pressure and heart rate are good today.  Plan Labs reviewed. -CBC and CMP are well-managed. -Aldosterone and renin activity levels were normal. - Reviewed myeloma panel from 06/30/2024.  M protein not observed. Patient labs and presentation are appropriate for treatment today.  - Proceed with cycle 8 day 15 Kyprolis .  Continue dexamethasone  as prescribed. Labs/flush, follow-up, and cycle 9 day 1 Kyprolis  as scheduled.   The patient understands the plans discussed today and is in agreement with them.  She knows to contact  our office if she develops concerns prior to her next appointment.  I provided 25 minutes of face-to-face time during this encounter and > 50% was spent counseling as documented under my assessment and plan.    Powell FORBES Lessen, NP  [facility] - A DEPT OF [facility] [address] [place] [address] Dept: [phone] Dept Fax: [phone]   Orders Placed This Encounter  Procedures   Aldosterone + renin activity w/ ratio    Standing Status:   Future    Number of Occurrences:   1    Expected Date:   07/14/2024    Expiration Date:   07/14/2025      CHIEF COMPLAINT:  CC: Multiple myeloma  Current Treatment:  Kyprolis  and dexamethasone  on dasy 1, 8, ad 15 of 28 day cycle   INTERVAL HISTORY:  Seriah is here today for repeat clinical assessment.  She last saw Dr. Federico on 06/30/2024. Today, she presents for cycle 8 day 15 chemotherapy Kyprolis . Taking dexamethazone at home daily.   States that has been noted her blood pressure gets elevated the third day after chemotherapy.  She has felt facial pressure and flushing.  She has also had palpitations and increased heart rate.  Her primary care provider started furosemide along with potassium supplementation.  She denies chest pain, chest pressure, or shortness of breath. She denies headaches or visual disturbances. He denies abdominal pain, nausea, vomiting, or changes in bowel or bladder habits.  She denies fevers or chills. She denies pain. Her appetite is  good. Her weight has been stable.  I have reviewed the past medical history, past surgical history, social history and family history with the patient and they are unchanged from previous note.  ALLERGIES:  is allergic to penicillins and sulfa antibiotics.  MEDICATIONS:  Current Outpatient Medications  Medication Sig Dispense Refill   acetaminophen  (TYLENOL ) 500 MG tablet Take 1,000 mg by mouth every 6 (six) hours as  needed for mild pain (pain score 1-3) or headache.     acyclovir  (ZOVIRAX ) 400 MG tablet Take 400 mg by mouth in the morning.     ALPRAZolam (XANAX) 0.25 MG tablet Take 0.25 mg by mouth at bedtime as needed.     carfilzomib  (KYPROLIS ) 10 MG SOLR as directed Intravenous clarify dose. infusion is every monday 3 times a month with a break one week each month. long term med     Cholecalciferol 25 MCG (1000 UT) capsule Take 1,000 Units by mouth daily.     Cyanocobalamin  (VITAMIN B 12 PO) Take 1,000 mcg by mouth daily.     dexamethasone  (DECADRON ) 4 MG tablet Take 10 tablets (40 mg total) by mouth once a week. Take once weekly on the day you receive the chemotherapy injection. 40 tablet 5   fluticasone (FLONASE) 50 MCG/ACT nasal spray Place 2 sprays into both nostrils as needed for rhinitis or allergies.     furosemide (LASIX) 20 MG tablet Take 20 mg by mouth daily.     hydrALAZINE  (APRESOLINE ) 50 MG tablet Take 1 tablet (50 mg total) by mouth every 4 hours as needed for SBP>150. 30 tablet 5   HYDROcodone -acetaminophen  (NORCO/VICODIN) 5-325 MG tablet Take 1 tablet by mouth every 6 (six) hours as needed for moderate pain (pain score 4-6). 21 tablet 0   levocetirizine (XYZAL) 5 MG tablet Take 5 mg by mouth at bedtime.     lidocaine -prilocaine  (EMLA ) cream Apply 1 Application topically as needed. 30 g 1   ondansetron  (ZOFRAN ) 8 MG tablet Take 1 tablet (8 mg total) by mouth every 8 (eight) hours as needed. 30 tablet 0   Polyethyl Glyc-Propyl Glyc PF (SYSTANE PRESERVATIVE FREE) 0.4-0.3 % SOLN Place 1-2 drops into both eyes daily as needed (dry eyes).     potassium chloride (KLOR-CON) 10 MEQ tablet Take 10 mEq by mouth daily.     prochlorperazine  (COMPAZINE ) 10 MG tablet Take 1 tablet (10 mg total) by mouth every 6 (six) hours as needed for nausea or vomiting. 30 tablet 0   rosuvastatin (CRESTOR) 5 MG tablet Take 5 mg by mouth daily.     telmisartan (MICARDIS) 80 MG tablet Take 1 tablet (80 mg total) by mouth  daily.     triamcinolone  (NASACORT  ALLERGY 24HR) 55 MCG/ACT AERO nasal inhaler Place 2 sprays into the nose daily.     No current facility-administered medications for this visit.    HISTORY OF PRESENT ILLNESS:   Oncology History  Multiple myeloma (HCC)  06/11/2023 Initial Diagnosis   Multiple myeloma (HCC)   06/25/2023 - 08/20/2023 Chemotherapy   Patient is on Treatment Plan : MYELOMA  RVD SQ q21d x 6 cycles     09/03/2023 - 11/26/2023 Chemotherapy   Patient is on Treatment Plan : MYELOMA RELAPSED REFRACTORY Daratumumab  SQ + Lenalidomide  + Dexamethasone  (DaraRd) q28d     12/17/2023 -  Chemotherapy   Patient is on Treatment Plan : MYELOMA RELAPSED/REFRACTORY Carfilzomib  (20/70) D1,8,15 + Dexamethasone  weekly (40) (Kd) q28d  x 9 cycles / Dexamethasone  D1,8,15  REVIEW OF SYSTEMS:   Constitutional: Denies fevers, chills or abnormal weight loss Eyes: Denies blurriness of vision Ears, nose, mouth, throat, and face: Denies mucositis or sore throat Respiratory: Denies cough, dyspnea or wheezes Cardiovascular: Denies chest discomfort or lower extremity swelling.  Has recently had elevated blood pressure with facial pressure, palpitations and elevated heart rate.  This is improved with addition of furosemide and potassium supplement. Gastrointestinal:  Denies nausea, heartburn or change in bowel habits Skin: Denies abnormal skin rashes Lymphatics: Denies new lymphadenopathy or easy bruising Neurological:Denies numbness, tingling or new weaknesses Behavioral/Psych: Mood is stable, no new changes  All other systems were reviewed with the patient and are negative.   VITALS:   Today's Vitals   07/14/24 1058 07/14/24 1103  BP: 116/60   Pulse: 83   Resp: 17   Temp: 98 F (36.7 C)   SpO2: 99%   Weight: 140 lb 4.8 oz (63.6 kg)   PainSc:  0-No pain   Body mass index is 26.51 kg/m.   Wt Readings from Last 3 Encounters:  07/14/24 140 lb 4.8 oz (63.6 kg)  07/10/24 139 lb (63 kg)   07/07/24 139 lb (63 kg)    Body mass index is 26.51 kg/m.  Performance status (ECOG): 1 - Symptomatic but completely ambulatory  PHYSICAL EXAM:   GENERAL:alert, no distress and comfortable SKIN: skin color, texture, turgor are normal, no rashes or significant lesions EYES: normal, Conjunctiva are pink and non-injected, sclera clear OROPHARYNX:no exudate, no erythema and lips, buccal mucosa, and tongue normal  NECK: supple, thyroid  normal size, non-tender, without nodularity LYMPH:  no palpable lymphadenopathy in the cervical, axillary or inguinal LUNGS: clear to auscultation and percussion with normal breathing effort HEART: regular rate & rhythm and no murmurs and no lower extremity edema ABDOMEN:abdomen soft, non-tender and normal bowel sounds Musculoskeletal:no cyanosis of digits and no clubbing  NEURO: alert & oriented x 3 with fluent speech, no focal motor/sensory deficits  LABORATORY DATA:  I have reviewed the data as listed    Component Value Date/Time   NA 140 07/14/2024 1034   K 4.0 07/14/2024 1034   CL 106 07/14/2024 1034   CO2 30 07/14/2024 1034   GLUCOSE 98 07/14/2024 1034   BUN 16 07/14/2024 1034   CREATININE 0.63 07/14/2024 1034   CREATININE 0.67 12/08/2014 0851   CALCIUM 8.7 (L) 07/14/2024 1034   PROT 5.6 (L) 07/14/2024 1034   ALBUMIN 4.0 07/14/2024 1034   AST 15 07/14/2024 1034   ALT 17 07/14/2024 1034   ALKPHOS 75 07/14/2024 1034   BILITOT 1.3 (H) 07/14/2024 1034   GFRNONAA >60 07/14/2024 1034   GFRAA 73 (L) 04/12/2014 1659    Lab Results  Component Value Date   WBC 7.5 07/14/2024   NEUTROABS 6.3 07/14/2024   HGB 12.6 07/14/2024   HCT 37.4 07/14/2024   MCV 96.6 07/14/2024   PLT 157 07/14/2024

## 2024-07-14 ENCOUNTER — Encounter (HOSPITAL_BASED_OUTPATIENT_CLINIC_OR_DEPARTMENT_OTHER): Payer: Self-pay

## 2024-07-14 ENCOUNTER — Inpatient Hospital Stay

## 2024-07-14 ENCOUNTER — Inpatient Hospital Stay (HOSPITAL_BASED_OUTPATIENT_CLINIC_OR_DEPARTMENT_OTHER): Admitting: Nurse Practitioner

## 2024-07-14 VITALS — BP 116/60 | HR 83 | Temp 98.0°F | Resp 17 | Wt 140.3 lb

## 2024-07-14 DIAGNOSIS — C9000 Multiple myeloma not having achieved remission: Secondary | ICD-10-CM

## 2024-07-14 DIAGNOSIS — Z5112 Encounter for antineoplastic immunotherapy: Secondary | ICD-10-CM | POA: Diagnosis not present

## 2024-07-14 LAB — CBC WITH DIFFERENTIAL (CANCER CENTER ONLY)
Abs Immature Granulocytes: 0.04 K/uL (ref 0.00–0.07)
Basophils Absolute: 0 K/uL (ref 0.0–0.1)
Basophils Relative: 0 %
Eosinophils Absolute: 0.1 K/uL (ref 0.0–0.5)
Eosinophils Relative: 2 %
HCT: 37.4 % (ref 36.0–46.0)
Hemoglobin: 12.6 g/dL (ref 12.0–15.0)
Immature Granulocytes: 1 %
Lymphocytes Relative: 10 %
Lymphs Abs: 0.8 K/uL (ref 0.7–4.0)
MCH: 32.6 pg (ref 26.0–34.0)
MCHC: 33.7 g/dL (ref 30.0–36.0)
MCV: 96.6 fL (ref 80.0–100.0)
Monocytes Absolute: 0.2 K/uL (ref 0.1–1.0)
Monocytes Relative: 3 %
Neutro Abs: 6.3 K/uL (ref 1.7–7.7)
Neutrophils Relative %: 84 %
Platelet Count: 157 K/uL (ref 150–400)
RBC: 3.87 MIL/uL (ref 3.87–5.11)
RDW: 12.4 % (ref 11.5–15.5)
WBC Count: 7.5 K/uL (ref 4.0–10.5)
nRBC: 0 % (ref 0.0–0.2)

## 2024-07-14 LAB — CMP (CANCER CENTER ONLY)
ALT: 17 U/L (ref 0–44)
AST: 15 U/L (ref 15–41)
Albumin: 4 g/dL (ref 3.5–5.0)
Alkaline Phosphatase: 75 U/L (ref 38–126)
Anion gap: 4 — ABNORMAL LOW (ref 5–15)
BUN: 16 mg/dL (ref 8–23)
CO2: 30 mmol/L (ref 22–32)
Calcium: 8.7 mg/dL — ABNORMAL LOW (ref 8.9–10.3)
Chloride: 106 mmol/L (ref 98–111)
Creatinine: 0.63 mg/dL (ref 0.44–1.00)
GFR, Estimated: >60 mL/min (ref >60)
Glucose, Bld: 98 mg/dL (ref 70–99)
Potassium: 4 mmol/L (ref 3.5–5.1)
Sodium: 140 mmol/L (ref 135–145)
Total Bilirubin: 1.3 mg/dL — ABNORMAL HIGH (ref 0.0–1.2)
Total Protein: 5.6 g/dL — ABNORMAL LOW (ref 6.5–8.1)

## 2024-07-14 MED ORDER — DEXTROSE 5 % IV SOLN
70.0000 mg/m2 | Freq: Once | INTRAVENOUS | Status: AC
  Administered 2024-07-14: 120 mg via INTRAVENOUS
  Filled 2024-07-14: qty 60

## 2024-07-14 MED ORDER — SODIUM CHLORIDE 0.9 % IV SOLN: Freq: Once | INTRAVENOUS | Status: AC

## 2024-07-14 MED ORDER — SODIUM CHLORIDE 0.9% FLUSH: 10.0000 mL | INTRAVENOUS | Status: DC | PRN

## 2024-07-14 MED ORDER — SODIUM CHLORIDE 0.9 % IV SOLN: INTRAVENOUS | Status: DC

## 2024-07-14 NOTE — Progress Notes (Signed)
 Patient took her steroids and some tylenol  before treatment.

## 2024-07-14 NOTE — Patient Instructions (Signed)
 [facility] - A DEPT OF [facility]  Discharge Instructions: Thank you for choosing [facility] to provide your oncology and hematology care.   If you have a lab appointment with the Cancer Center, please go directly to the Cancer Center and check in at the registration area.   Wear comfortable clothing and clothing appropriate for easy access to any Portacath or PICC line.   We strive to give you quality time with your provider. You may need to reschedule your appointment if you arrive late (15 or more minutes).  Arriving late affects you and other patients whose appointments are after yours.  Also, if you miss three or more appointments without notifying the office, you may be dismissed from the clinic at the provider's discretion.      For prescription refill requests, have your pharmacy contact our office and allow 72 hours for refills to be completed.    Today you received the following chemotherapy and/or immunotherapy agent: Carfilzomib (Kyprolis)      To help prevent nausea and vomiting after your treatment, we encourage you to take your nausea medication as directed.  BELOW ARE SYMPTOMS THAT SHOULD BE REPORTED IMMEDIATELY: *FEVER GREATER THAN 100.4 F (38 C) OR HIGHER *CHILLS OR SWEATING *NAUSEA AND VOMITING THAT IS NOT CONTROLLED WITH YOUR NAUSEA MEDICATION *UNUSUAL SHORTNESS OF BREATH *UNUSUAL BRUISING OR BLEEDING *URINARY PROBLEMS (pain or burning when urinating, or frequent urination) *BOWEL PROBLEMS (unusual diarrhea, constipation, pain near the anus) TENDERNESS IN MOUTH AND THROAT WITH OR WITHOUT PRESENCE OF ULCERS (sore throat, sores in mouth, or a toothache) UNUSUAL RASH, SWELLING OR PAIN  UNUSUAL VAGINAL DISCHARGE OR ITCHING   Items with * indicate a potential emergency and should be followed up as soon as possible or go to the Emergency Department if any problems should occur.  Please show the CHEMOTHERAPY ALERT CARD or  IMMUNOTHERAPY ALERT CARD at check-in to the Emergency Department and triage nurse.  Should you have questions after your visit or need to cancel or reschedule your appointment, please contact [facility] - [facility]  Dept: [phone]  and follow the prompts.  Office hours are 8:00 a.m. to 4:30 p.m. Monday - Friday. Please note that voicemails left after 4:00 p.m. may not be returned until the following business day.  We are closed weekends and major holidays. You have access to a nurse at all times for urgent questions. Please call the main number to the clinic Dept: [phone] and follow the prompts.   For any non-urgent questions, you may also contact your provider using MyChart. We now offer e-Visits for anyone 81 and older to request care online for non-urgent symptoms. For details visit [email or web address].   Also download the MyChart app! Go to the app store, search "MyChart", open the app, select [place], and log in with your MyChart username and password.

## 2024-07-15 ENCOUNTER — Other Ambulatory Visit: Payer: Self-pay

## 2024-07-18 LAB — ALDOSTERONE + RENIN ACTIVITY W/ RATIO
ALDO / PRA Ratio: 2.8 (ref 0.0–30.0)
Aldosterone: 9.2 ng/dL (ref 0.0–30.0)
PRA LC/MS/MS: 3.285 ng/mL/h (ref 0.167–5.380)

## 2024-07-21 ENCOUNTER — Encounter (HOSPITAL_BASED_OUTPATIENT_CLINIC_OR_DEPARTMENT_OTHER): Payer: Self-pay

## 2024-07-22 ENCOUNTER — Encounter: Payer: Self-pay | Admitting: Nurse Practitioner

## 2024-07-22 ENCOUNTER — Encounter: Payer: Self-pay | Admitting: Hematology and Oncology

## 2024-07-25 ENCOUNTER — Encounter (HOSPITAL_BASED_OUTPATIENT_CLINIC_OR_DEPARTMENT_OTHER): Payer: Self-pay

## 2024-07-28 ENCOUNTER — Inpatient Hospital Stay

## 2024-07-28 ENCOUNTER — Encounter: Payer: Self-pay | Admitting: Hematology and Oncology

## 2024-07-28 ENCOUNTER — Inpatient Hospital Stay: Attending: Hematology and Oncology

## 2024-07-28 ENCOUNTER — Ambulatory Visit: Admitting: Hematology and Oncology

## 2024-07-28 ENCOUNTER — Other Ambulatory Visit

## 2024-07-28 VITALS — BP 131/68 | HR 82 | Temp 97.7°F | Resp 16 | Wt 140.5 lb

## 2024-07-28 DIAGNOSIS — Z5112 Encounter for antineoplastic immunotherapy: Secondary | ICD-10-CM | POA: Diagnosis present

## 2024-07-28 DIAGNOSIS — C9000 Multiple myeloma not having achieved remission: Secondary | ICD-10-CM | POA: Diagnosis present

## 2024-07-28 DIAGNOSIS — Z79899 Other long term (current) drug therapy: Secondary | ICD-10-CM | POA: Insufficient documentation

## 2024-07-28 LAB — CBC WITH DIFFERENTIAL (CANCER CENTER ONLY)
Abs Immature Granulocytes: 0.03 K/uL (ref 0.00–0.07)
Basophils Absolute: 0 K/uL (ref 0.0–0.1)
Basophils Relative: 1 %
Eosinophils Absolute: 0 K/uL (ref 0.0–0.5)
Eosinophils Relative: 0 %
HCT: 38.5 % (ref 36.0–46.0)
Hemoglobin: 12.8 g/dL (ref 12.0–15.0)
Immature Granulocytes: 0 %
Lymphocytes Relative: 7 %
Lymphs Abs: 0.6 K/uL — ABNORMAL LOW (ref 0.7–4.0)
MCH: 32.5 pg (ref 26.0–34.0)
MCHC: 33.2 g/dL (ref 30.0–36.0)
MCV: 97.7 fL (ref 80.0–100.0)
Monocytes Absolute: 0 K/uL — ABNORMAL LOW (ref 0.1–1.0)
Monocytes Relative: 1 %
Neutro Abs: 8.1 K/uL — ABNORMAL HIGH (ref 1.7–7.7)
Neutrophils Relative %: 91 %
Platelet Count: 281 K/uL (ref 150–400)
RBC: 3.94 MIL/uL (ref 3.87–5.11)
RDW: 12.5 % (ref 11.5–15.5)
WBC Count: 8.7 K/uL (ref 4.0–10.5)
nRBC: 0 % (ref 0.0–0.2)

## 2024-07-28 LAB — CMP (CANCER CENTER ONLY)
ALT: 15 U/L (ref 0–44)
AST: 14 U/L — ABNORMAL LOW (ref 15–41)
Albumin: 4.3 g/dL (ref 3.5–5.0)
Alkaline Phosphatase: 80 U/L (ref 38–126)
Anion gap: 8 (ref 5–15)
BUN: 19 mg/dL (ref 8–23)
CO2: 28 mmol/L (ref 22–32)
Calcium: 9.2 mg/dL (ref 8.9–10.3)
Chloride: 106 mmol/L (ref 98–111)
Creatinine: 0.81 mg/dL (ref 0.44–1.00)
GFR, Estimated: >60 mL/min (ref >60)
Glucose, Bld: 183 mg/dL — ABNORMAL HIGH (ref 70–99)
Potassium: 3.7 mmol/L (ref 3.5–5.1)
Sodium: 142 mmol/L (ref 135–145)
Total Bilirubin: 1.4 mg/dL — ABNORMAL HIGH (ref 0.0–1.2)
Total Protein: 6 g/dL — ABNORMAL LOW (ref 6.5–8.1)

## 2024-07-28 MED ORDER — SODIUM CHLORIDE 0.9 % IV SOLN: INTRAVENOUS | Status: DC

## 2024-07-28 MED ORDER — DEXTROSE 5 % IV SOLN
70.0000 mg/m2 | Freq: Once | INTRAVENOUS | Status: AC
  Administered 2024-07-28: 120 mg via INTRAVENOUS
  Filled 2024-07-28: qty 60

## 2024-07-28 MED ORDER — SODIUM CHLORIDE 0.9 % IV SOLN: Freq: Once | INTRAVENOUS | Status: AC

## 2024-07-28 NOTE — Patient Instructions (Addendum)
 [facility] - A DEPT OF [facility]   Discharge Instructions: Thank you for choosing [facility] to provide your oncology and hematology care.   If you have a lab appointment with the Cancer Center, please go directly to the Cancer Center and check in at the registration area.   Wear comfortable clothing and clothing appropriate for easy access to any Portacath or PICC line.   We strive to give you quality time with your provider. You may need to reschedule your appointment if you arrive late (15 or more minutes).  Arriving late affects you and other patients whose appointments are after yours.  Also, if you miss three or more appointments without notifying the office, you may be dismissed from the clinic at the provider's discretion.      For prescription refill requests, have your pharmacy contact our office and allow 72 hours for refills to be completed.    Today you received the following chemotherapy and/or immunotherapy agents: Carfilzomib (Kyprolis)      To help prevent nausea and vomiting after your treatment, we encourage you to take your nausea medication as directed.  BELOW ARE SYMPTOMS THAT SHOULD BE REPORTED IMMEDIATELY: *FEVER GREATER THAN 100.4 F (38 C) OR HIGHER *CHILLS OR SWEATING *NAUSEA AND VOMITING THAT IS NOT CONTROLLED WITH YOUR NAUSEA MEDICATION *UNUSUAL SHORTNESS OF BREATH *UNUSUAL BRUISING OR BLEEDING *URINARY PROBLEMS (pain or burning when urinating, or frequent urination) *BOWEL PROBLEMS (unusual diarrhea, constipation, pain near the anus) TENDERNESS IN MOUTH AND THROAT WITH OR WITHOUT PRESENCE OF ULCERS (sore throat, sores in mouth, or a toothache) UNUSUAL RASH, SWELLING OR PAIN  UNUSUAL VAGINAL DISCHARGE OR ITCHING   Items with * indicate a potential emergency and should be followed up as soon as possible or go to the Emergency Department if any problems should occur.  Please show the CHEMOTHERAPY ALERT CARD or  IMMUNOTHERAPY ALERT CARD at check-in to the Emergency Department and triage nurse.  Should you have questions after your visit or need to cancel or reschedule your appointment, please contact [facility] - A DEPT [facility]  Dept: [phone]  and follow the prompts.  Office hours are 8:00 a.m. to 4:30 p.m. Monday - Friday. Please note that voicemails left after 4:00 p.m. may not be returned until the following business day.  We are closed weekends and major holidays. You have access to a nurse at all times for urgent questions. Please call the main number to the clinic Dept: [phone] and follow the prompts.   For any non-urgent questions, you may also contact your provider using MyChart. We now offer e-Visits for anyone 55 and older to request care online for non-urgent symptoms. For details visit [email or web address].   Also download the MyChart app! Go to the app store, search "MyChart", open the app, select [place], and log in with your MyChart username and password.

## 2024-07-29 DIAGNOSIS — M1990 Unspecified osteoarthritis, unspecified site: Secondary | ICD-10-CM | POA: Diagnosis not present

## 2024-07-29 DIAGNOSIS — E559 Vitamin D deficiency, unspecified: Secondary | ICD-10-CM | POA: Diagnosis not present

## 2024-07-29 DIAGNOSIS — I700 Atherosclerosis of aorta: Secondary | ICD-10-CM | POA: Diagnosis not present

## 2024-07-29 DIAGNOSIS — Z0000 Encounter for general adult medical examination without abnormal findings: Secondary | ICD-10-CM | POA: Diagnosis not present

## 2024-07-29 DIAGNOSIS — Z8249 Family history of ischemic heart disease and other diseases of the circulatory system: Secondary | ICD-10-CM | POA: Diagnosis not present

## 2024-07-29 DIAGNOSIS — Z800 Family history of malignant neoplasm of digestive organs: Secondary | ICD-10-CM | POA: Diagnosis not present

## 2024-07-29 DIAGNOSIS — D7589 Other specified diseases of blood and blood-forming organs: Secondary | ICD-10-CM | POA: Diagnosis not present

## 2024-07-29 DIAGNOSIS — E785 Hyperlipidemia, unspecified: Secondary | ICD-10-CM | POA: Diagnosis not present

## 2024-07-29 DIAGNOSIS — M8580 Other specified disorders of bone density and structure, unspecified site: Secondary | ICD-10-CM | POA: Diagnosis not present

## 2024-07-29 DIAGNOSIS — C9000 Multiple myeloma not having achieved remission: Secondary | ICD-10-CM | POA: Diagnosis not present

## 2024-07-29 DIAGNOSIS — I10 Essential (primary) hypertension: Secondary | ICD-10-CM | POA: Diagnosis not present

## 2024-07-29 DIAGNOSIS — R7301 Impaired fasting glucose: Secondary | ICD-10-CM | POA: Diagnosis not present

## 2024-07-29 LAB — KAPPA/LAMBDA LIGHT CHAINS
Kappa free light chain: 1.3 mg/L — ABNORMAL LOW (ref 3.3–19.4)
Kappa, lambda light chain ratio: 0.02 — ABNORMAL LOW (ref 0.26–1.65)
Lambda free light chains: 56 mg/L — ABNORMAL HIGH (ref 5.7–26.3)

## 2024-08-01 LAB — MULTIPLE MYELOMA PANEL, SERUM
Albumin SerPl Elph-Mcnc: 3.8 g/dL (ref 2.9–4.4)
Albumin/Glob SerPl: 2 — ABNORMAL HIGH (ref 0.7–1.7)
Alpha 1: 0.3 g/dL (ref 0.0–0.4)
Alpha2 Glob SerPl Elph-Mcnc: 0.7 g/dL (ref 0.4–1.0)
B-Globulin SerPl Elph-Mcnc: 0.8 g/dL (ref 0.7–1.3)
Gamma Glob SerPl Elph-Mcnc: 0.1 g/dL — ABNORMAL LOW (ref 0.4–1.8)
Globulin, Total: 2 g/dL — ABNORMAL LOW (ref 2.2–3.9)
IgA: <5 mg/dL — ABNORMAL LOW (ref 64–422)
IgG (Immunoglobin G), Serum: 139 mg/dL — ABNORMAL LOW (ref 586–1602)
IgM (Immunoglobulin M), Srm: <5 mg/dL — ABNORMAL LOW (ref 26–217)
Total Protein ELP: 5.8 g/dL — ABNORMAL LOW (ref 6.0–8.5)

## 2024-08-03 ENCOUNTER — Other Ambulatory Visit: Payer: Self-pay

## 2024-08-03 NOTE — Progress Notes (Unsigned)
 [facility] [phone]   [phone]  PROGRESS NOTE  Patient Care Team: Shayne Anes, MD as PCP - General (Internal Medicine)  Hematological/Oncological History # Free Lambda Multiple Myeloma 11/10/2022: WBC 5.68, Hgb 11.7, MCV 97.4, Plt 146 04/17/2023: WBC 4.53, Hgb 11.5, MCV 107.6, Plt 110 05/16/2023: establish care with Dr. Federico. Labs showed M protein 0.1 with Lambda 900, Kappa 3 with ratio 0.00.   06/01/2023: bone marrow biopsy performed, showed plasma cell myeloma nearly effacing approximately 80% of the cellular marrow.  06/25/2023: Cycle 1 Day 1 of VRD chemotherapy 07/23/2023: Cycle 2 Day 1 of VRD chemotherapy 08/13/2023: Cycle 3 Day 1 of VRD chemotherapy 09/03/2023: Cycle 1 Day 1 of Dara/Rev/Dex. 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 12/17/2023: Cycle 1 Day 1 of Kyprolis . Dara/Rev ineffective.   Interval History:  Brooke Whitaker 73 y.o. female with medical history significant for newly diagnosed multiple myeloma who presents for a follow up visit. The patient's last visit was on 07/14/2024. In the interim since the last visit she has had no major changes in her health.  On exam today [name] reports she has been pretty good overall in interim since our last visit.  She reports that she did have some issues with spikes in her blood pressure and headaches but overall has been well.  She reports the current treatment is going well with no major side effects.  She is having some fatigue and recovers well after some rest days.  She is not having any numbness or tingling of her fingers and toes and denies any nausea, vomiting, or diarrhea..  She reports her appetite is good.  She is enjoying the colder weather.  Overall she is willing and able to continue with Kyprolis  chemotherapy at this time.  She denies any fevers, chills, sweats, nausea, vomiting or diarrhea.  Full 10 point ROS otherwise negative.  Previously we had additional discussion with  her son regarding options moving forward.  We discussed the slight decline in her lambda light chains which is reassuring.   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    Sigmoid diverticulosis    Wears glasses     SURGICAL HISTORY: Past Surgical History:  Procedure Laterality Date   BREAST BIOPSY Right 09/2011   benign    COLONOSCOPY  2010   CYSTOSCOPY/RETROGRADE/URETEROSCOPY/STONE EXTRACTION WITH BASKET Left 04/21/2014   Procedure: cystoscopy, left retrograde pylegram, LEFT URETEROSCOPY, laser lithotripsy with holmium laser, STONE EXTRACTION, uretheral calibration;  Surgeon: Norleen JINNY Seltzer, MD;  Location: [facility];  Service: Urology;  Laterality: Left;   DILATION AND EVACUATION  1995   EPISIOTOMY REPAIR, SEVERE TEAR  1989   IR IMAGING GUIDED PORT INSERTION  02/04/2024   LAPAROSCOPIC OVARIAN CYSTECTOMY  10/ 2000   NM MYOCAR PERF WALL MOTION  12/15/2009   Normal   US  ECHOCARDIOGRAPHY  12/15/2009   Normal study for age: mild MR,TR and trace PI    SOCIAL HISTORY: Social History   Socioeconomic History   Marital status: Married    Spouse name: Not on file   Number of children: 2   Years of education: Not on file   Highest education level: Not on file  Occupational History   Occupation: Print production planner  Tobacco  Use   Smoking status: Never    Passive exposure: Never   Smokeless tobacco: Never  Vaping Use   Vaping status: Never Used  Substance and Sexual Activity   Alcohol  use: Yes    Alcohol /week: 2.0 standard drinks of alcohol     Types: 2 Glasses of wine per week   Drug use: No   Sexual activity: Not Currently    Partners: Male  Other Topics Concern   Not on file  Social History Narrative   Not on file   Social Drivers of Health   Financial Resource Strain: Not on file   Food Insecurity: No Food Insecurity (05/16/2023)   Hunger Vital Sign    Worried About Running Out of Food in the Last Year: Never true    Ran Out of Food in the Last Year: Never true  Transportation Needs: No Transportation Needs (05/16/2023)   PRAPARE - Administrator, Civil Service (Medical): No    Lack of Transportation (Non-Medical): No  Physical Activity: Not on file  Stress: Not on file  Social Connections: Not on file  Intimate Partner Violence: Not At Risk (05/16/2023)   Humiliation, Afraid, Rape, and Kick questionnaire    Fear of Current or Ex-Partner: No    Emotionally Abused: No    Physically Abused: No    Sexually Abused: No    FAMILY HISTORY: Family History  Problem Relation Age of Onset   Diabetes Mother    Congestive Heart Failure Mother    Stroke Mother    Osteoporosis Mother    Other Mother        gallbladder ruptured   Heart attack Father        initial in 59s, and massiv MI at 52   Heart attack Sister        fatal MI   Diabetes Sister    Heart disease Sister    Heart disease Sister        x2   Hypertension Sister    Thyroid  disease Sister    Hypertension Sister    Diabetes Brother    Dementia Brother    Colon cancer Brother        mets to liver   Heart disease Brother        pacemaker   Stroke Brother    Liver cancer Brother    Heart disease Brother    Stomach cancer Neg Hx    Pancreatic cancer Neg Hx    Esophageal cancer Neg Hx     ALLERGIES:  is allergic to penicillins and sulfa antibiotics.  MEDICATIONS:  Current Outpatient Medications  Medication Sig Dispense Refill   acetaminophen  (TYLENOL ) 500 MG tablet Take 1,000 mg by mouth every 6 (six) hours as needed for mild pain (pain score 1-3) or headache.     acyclovir  (ZOVIRAX ) 400 MG tablet Take 400 mg by mouth in the morning.     ALPRAZolam (XANAX) 0.25 MG tablet Take 0.25 mg by mouth at bedtime as needed.     carfilzomib  (KYPROLIS ) 10 MG SOLR as directed Intravenous clarify  dose. infusion is every monday 3 times a month with a break one week each month. long term med     Cholecalciferol 25 MCG (1000 UT) capsule Take 1,000 Units by mouth daily.     Cyanocobalamin  (VITAMIN B 12 PO) Take 1,000 mcg by mouth daily.     dexamethasone  (DECADRON ) 4 MG tablet Take 10 tablets (40 mg total) by mouth once a week. Take once  weekly on the day you receive the chemotherapy injection. 40 tablet 5   fluticasone (FLONASE) 50 MCG/ACT nasal spray Place 2 sprays into both nostrils as needed for rhinitis or allergies.     furosemide (LASIX) 20 MG tablet Take 20 mg by mouth daily.     hydrALAZINE  (APRESOLINE ) 50 MG tablet Take 1 tablet (50 mg total) by mouth every 4 hours as needed for SBP>150. 30 tablet 5   HYDROcodone -acetaminophen  (NORCO/VICODIN) 5-325 MG tablet Take 1 tablet by mouth every 6 (six) hours as needed for moderate pain (pain score 4-6). 21 tablet 0   levocetirizine (XYZAL) 5 MG tablet Take 5 mg by mouth at bedtime.     lidocaine -prilocaine  (EMLA ) cream Apply 1 Application topically as needed. 30 g 1   ondansetron  (ZOFRAN ) 8 MG tablet Take 1 tablet (8 mg total) by mouth every 8 (eight) hours as needed. 30 tablet 0   Polyethyl Glyc-Propyl Glyc PF (SYSTANE PRESERVATIVE FREE) 0.4-0.3 % SOLN Place 1-2 drops into both eyes daily as needed (dry eyes).     potassium chloride (KLOR-CON) 10 MEQ tablet Take 10 mEq by mouth daily.     prochlorperazine  (COMPAZINE ) 10 MG tablet Take 1 tablet (10 mg total) by mouth every 6 (six) hours as needed for nausea or vomiting. 30 tablet 0   rosuvastatin (CRESTOR) 5 MG tablet Take 5 mg by mouth daily.     telmisartan (MICARDIS) 80 MG tablet Take 1 tablet (80 mg total) by mouth daily.     triamcinolone  (NASACORT  ALLERGY 24HR) 55 MCG/ACT AERO nasal inhaler Place 2 sprays into the nose daily.     No current facility-administered medications for this visit.   Facility-Administered Medications Ordered in Other Visits  Medication Dose Route Frequency  Provider Last Rate Last Admin   0.9 %  sodium chloride  infusion   Intravenous Continuous Federico Norleen DASEN IV, MD        REVIEW OF SYSTEMS:   Constitutional: ( - ) fevers, ( - )  chills , ( - ) night sweats Eyes: ( - ) blurriness of vision, ( - ) double vision, ( - ) watery eyes Ears, nose, mouth, throat, and face: ( - ) mucositis, ( - ) sore throat Respiratory: ( - ) cough, ( - ) dyspnea, ( - ) wheezes Cardiovascular: ( - ) palpitation, ( - ) chest discomfort, ( - ) lower extremity swelling Gastrointestinal:  ( - ) nausea, ( - ) heartburn, ( - ) change in bowel habits Skin: ( - ) abnormal skin rashes Lymphatics: ( - ) new lymphadenopathy, ( - ) easy bruising Neurological: ( - ) numbness, ( - ) tingling, ( - ) new weaknesses Behavioral/Psych: ( - ) mood change, ( - ) new changes  All other systems were reviewed with the patient and are negative.  PHYSICAL EXAMINATION: ECOG PERFORMANCE STATUS: 0 - Asymptomatic  Vitals:   08/04/24 1204  BP: 135/70  Pulse: 74  Resp: 14  Temp: 98.4 F (36.9 C)  SpO2: 98%     Filed Weights   08/04/24 1204  Weight: 141 lb 4.8 oz (64.1 kg)      GENERAL: Well-appearing elderly Caucasian female, alert, no distress and comfortable SKIN: skin color, texture, turgor are normal, no rashes or significant lesions EYES: conjunctiva are pink and non-injected, sclera clear LUNGS: clear to auscultation and percussion with normal breathing effort HEART: regular rate & rhythm and no murmurs and no lower extremity edema Musculoskeletal: no cyanosis of digits and no clubbing  PSYCH: alert & oriented x 3, fluent speech NEURO: no focal motor/sensory deficits  LABORATORY DATA:  I have reviewed the data as listed    Latest Ref Rng & Units 08/04/2024   11:37 AM 07/28/2024    1:45 PM 07/14/2024   10:34 AM  CBC  WBC 4.0 - 10.5 K/uL 7.8  8.7  7.5   Hemoglobin 12.0 - 15.0 g/dL 87.1  87.1  87.3   Hematocrit 36.0 - 46.0 % 38.7  38.5  37.4   Platelets 150 - 400  K/uL 124  281  157        Latest Ref Rng & Units 08/04/2024   11:37 AM 07/28/2024    1:45 PM 07/14/2024   10:34 AM  CMP  Glucose 70 - 99 mg/dL 894  816  98   BUN 8 - 23 mg/dL 16  19  16    Creatinine 0.44 - 1.00 mg/dL 9.41  9.18  9.36   Sodium 135 - 145 mmol/L 142  142  140   Potassium 3.5 - 5.1 mmol/L 4.1  3.7  4.0   Chloride 98 - 111 mmol/L 108  106  106   CO2 22 - 32 mmol/L 30  28  30    Calcium 8.9 - 10.3 mg/dL 9.4  9.2  8.7   Total Protein 6.5 - 8.1 g/dL 5.9  6.0  5.6   Total Bilirubin 0.0 - 1.2 mg/dL 1.6  1.4  1.3   Alkaline Phos 38 - 126 U/L 68  80  75   AST 15 - 41 U/L 14  14  15    ALT 0 - 44 U/L 15  15  17      Lab Results  Component Value Date   MPROTEIN Not Observed 07/28/2024   MPROTEIN Not Observed 06/30/2024   MPROTEIN Not Observed 06/02/2024   Lab Results  Component Value Date   KPAFRELGTCHN 1.3 (L) 07/28/2024   KPAFRELGTCHN 1.4 (L) 06/30/2024   KPAFRELGTCHN 1.3 (L) 06/02/2024   LAMBDASER 56.0 (H) 07/28/2024   LAMBDASER 53.6 (H) 06/30/2024   LAMBDASER 46.3 (H) 06/02/2024   KAPLAMBRATIO 0.02 (L) 07/28/2024   KAPLAMBRATIO 0.03 (L) 06/30/2024   KAPLAMBRATIO 0.03 (L) 06/02/2024    RADIOGRAPHIC STUDIES: No results found.    ASSESSMENT & PLAN SHERRAN MARGOLIS 73 y.o. female with medical history significant for newly diagnosed multiple myeloma who presents for a follow up visit.   Previously we discussed the diagnosis of multiple myeloma.  We discussed that this is a blood cancer that is not curable.  The goal is to get the patient into a durable remission with treatment.  We discussed the need for a minimum of 6 months of chemotherapy treatment then followed by maintenance therapy if the M protein has reached 0 and the free light chains are within normal limits.  We discussed expected side effects of the regimen and the plan moving forward.  The patient voiced her understanding of our findings, expected side effects, and risks/benefits.  She is okay with  proceeding with chemotherapy at this time.  # Free Lambda Multiple Myeloma -- Diagnosis confirmed by the presence of a macrocytic anemia and 80% plasma cells in the bone marrow --Initially started with VRD chemotherapy with the patient developed rash as result of the Revlimid  and ocular toxicity from the Velcade .  Dropped the Revlimid  down to 10 mg p.o. daily and transition to Darzalex  for her next treatment. --UPEP showed marked Bence Jones proteins in the urine, no evidence of lytic  lesions on metastatic survey PLAN: -- Continue Kyprolis /Dex therapy.  Today is Cycle 9 Day 8 --Labs today show white blood cell 7.8, hemoglobin 12.8, MCV 97.5, platelets 124.  LFTs and creatinine within normal limits.  --On 07/28/2024 patient had kappa 1.3, lambda 56.0, ratio 0.02 with an M protein undetectable. -- Offer the addition of Pomalyst but the patient declined and want to continue on Kyprolis  alone.  She is aware of the rising lambda chains. -- Plan for return to clinic in 2 weeks with interval continued Kyprolis  therapy.  #Supportive Care -- chemotherapy education complete -- port placed -- zofran  8mg  q8H PRN and compazine  10mg  PO q6H for nausea -- acyclovir  400mg  PO BID for VCZ prophylaxis -- EMLA  cream for port -- Zometa to be added after dental clearance.  -- no pain medication required at this time.   Orders Placed This Encounter  Procedures   DG Bone Survey Met    Standing Status:   Future    Expected Date:   08/11/2024    Expiration Date:   08/04/2025    Reason for Exam (SYMPTOM  OR DIAGNOSIS REQUIRED):   assess for progression of MM bone lesions    Preferred imaging location?:   [facility]   Multiple Myeloma Panel (SPEP&IFE w/QIG)    Standing Status:   Future    Expected Date:   09/15/2024    Expiration Date:   09/15/2025   Kappa/lambda light chains    Standing Status:   Future    Expected Date:   09/15/2024    Expiration Date:   09/15/2025   CBC with Differential (Cancer  Center Only)    Standing Status:   Future    Expected Date:   09/15/2024    Expiration Date:   09/15/2025   CMP (Cancer Center only)    Standing Status:   Future    Expected Date:   09/15/2024    Expiration Date:   09/15/2025   CBC with Differential (Cancer Center Only)    Standing Status:   Future    Expected Date:   09/22/2024    Expiration Date:   09/22/2025   CMP (Cancer Center only)    Standing Status:   Future    Expected Date:   09/22/2024    Expiration Date:   09/22/2025   CBC with Differential (Cancer Center Only)    Standing Status:   Future    Expected Date:   09/29/2024    Expiration Date:   09/29/2025   CMP (Cancer Center only)    Standing Status:   Future    Expected Date:   09/29/2024    Expiration Date:   09/29/2025   Multiple Myeloma Panel (SPEP&IFE w/QIG)    Standing Status:   Future    Expected Date:   10/13/2024    Expiration Date:   10/13/2025   Kappa/lambda light chains    Standing Status:   Future    Expected Date:   10/13/2024    Expiration Date:   10/13/2025   CBC with Differential (Cancer Center Only)    Standing Status:   Future    Expected Date:   10/13/2024    Expiration Date:   10/13/2025   CMP (Cancer Center only)    Standing Status:   Future    Expected Date:   10/13/2024    Expiration Date:   10/13/2025   CBC with Differential (Cancer Center Only)    Standing Status:   Future  Expected Date:   10/20/2024    Expiration Date:   10/20/2025   CMP (Cancer Center only)    Standing Status:   Future    Expected Date:   10/20/2024    Expiration Date:   10/20/2025   CBC with Differential (Cancer Center Only)    Standing Status:   Future    Expected Date:   10/27/2024    Expiration Date:   10/27/2025   CMP (Cancer Center only)    Standing Status:   Future    Expected Date:   10/27/2024    Expiration Date:   10/27/2025   All questions were answered. The patient knows to call the clinic with any problems, questions or concerns.  A total of more than 30  minutes were spent on this encounter with face-to-face time and non-face-to-face time, including preparing to see the patient, ordering tests and/or medications, counseling the patient and coordination of care as outlined above.   Norleen IVAR Kidney, MD Department of Hematology/Oncology [facility] Phone: [phone] Pager: [phone] Email: [email or web address]  08/04/2024 4:34 PM

## 2024-08-04 ENCOUNTER — Inpatient Hospital Stay: Admitting: Hematology and Oncology

## 2024-08-04 ENCOUNTER — Inpatient Hospital Stay

## 2024-08-04 ENCOUNTER — Other Ambulatory Visit: Payer: Self-pay | Admitting: *Deleted

## 2024-08-04 ENCOUNTER — Other Ambulatory Visit

## 2024-08-04 ENCOUNTER — Ambulatory Visit: Admitting: Nurse Practitioner

## 2024-08-04 VITALS — BP 135/70 | HR 74 | Temp 98.4°F | Resp 14 | Wt 141.3 lb

## 2024-08-04 DIAGNOSIS — C9000 Multiple myeloma not having achieved remission: Secondary | ICD-10-CM | POA: Diagnosis not present

## 2024-08-04 DIAGNOSIS — Z95828 Presence of other vascular implants and grafts: Secondary | ICD-10-CM | POA: Diagnosis not present

## 2024-08-04 DIAGNOSIS — Z5112 Encounter for antineoplastic immunotherapy: Secondary | ICD-10-CM | POA: Diagnosis not present

## 2024-08-04 LAB — CMP (CANCER CENTER ONLY)
ALT: 15 U/L (ref 0–44)
AST: 14 U/L — ABNORMAL LOW (ref 15–41)
Albumin: 4.2 g/dL (ref 3.5–5.0)
Alkaline Phosphatase: 68 U/L (ref 38–126)
Anion gap: 4 — ABNORMAL LOW (ref 5–15)
BUN: 16 mg/dL (ref 8–23)
CO2: 30 mmol/L (ref 22–32)
Calcium: 9.4 mg/dL (ref 8.9–10.3)
Chloride: 108 mmol/L (ref 98–111)
Creatinine: 0.58 mg/dL (ref 0.44–1.00)
GFR, Estimated: >60 mL/min (ref >60)
Glucose, Bld: 105 mg/dL — ABNORMAL HIGH (ref 70–99)
Potassium: 4.1 mmol/L (ref 3.5–5.1)
Sodium: 142 mmol/L (ref 135–145)
Total Bilirubin: 1.6 mg/dL — ABNORMAL HIGH (ref 0.0–1.2)
Total Protein: 5.9 g/dL — ABNORMAL LOW (ref 6.5–8.1)

## 2024-08-04 LAB — CBC WITH DIFFERENTIAL (CANCER CENTER ONLY)
Abs Immature Granulocytes: 0.05 K/uL (ref 0.00–0.07)
Basophils Absolute: 0 K/uL (ref 0.0–0.1)
Basophils Relative: 0 %
Eosinophils Absolute: 0 K/uL (ref 0.0–0.5)
Eosinophils Relative: 0 %
HCT: 38.7 % (ref 36.0–46.0)
Hemoglobin: 12.8 g/dL (ref 12.0–15.0)
Immature Granulocytes: 1 %
Lymphocytes Relative: 10 %
Lymphs Abs: 0.7 K/uL (ref 0.7–4.0)
MCH: 32.2 pg (ref 26.0–34.0)
MCHC: 33.1 g/dL (ref 30.0–36.0)
MCV: 97.5 fL (ref 80.0–100.0)
Monocytes Absolute: 0.2 K/uL (ref 0.1–1.0)
Monocytes Relative: 3 %
Neutro Abs: 6.7 K/uL (ref 1.7–7.7)
Neutrophils Relative %: 86 %
Platelet Count: 124 K/uL — ABNORMAL LOW (ref 150–400)
RBC: 3.97 MIL/uL (ref 3.87–5.11)
RDW: 12.9 % (ref 11.5–15.5)
WBC Count: 7.8 K/uL (ref 4.0–10.5)
nRBC: 0 % (ref 0.0–0.2)

## 2024-08-04 LAB — LIPID PANEL
Cholesterol: 209 mg/dL — ABNORMAL HIGH (ref 0–200)
HDL: 88 mg/dL (ref >40)
LDL Cholesterol: 93 mg/dL (ref 0–99)
Total CHOL/HDL Ratio: 2.4 ratio
Triglycerides: 142 mg/dL (ref <150)
VLDL: 28 mg/dL (ref 0–40)

## 2024-08-04 LAB — TSH: TSH: 1.91 u[IU]/mL (ref 0.350–4.500)

## 2024-08-04 LAB — VITAMIN D 25 HYDROXY (VIT D DEFICIENCY, FRACTURES): Vit D, 25-Hydroxy: 38.24 ng/mL (ref 30–100)

## 2024-08-04 MED ORDER — DEXTROSE 5 % IV SOLN
70.0000 mg/m2 | Freq: Once | INTRAVENOUS | Status: AC
  Administered 2024-08-04: 120 mg via INTRAVENOUS
  Filled 2024-08-04: qty 60

## 2024-08-04 MED ORDER — SODIUM CHLORIDE 0.9 % IV SOLN: INTRAVENOUS | Status: DC

## 2024-08-04 MED ORDER — SODIUM CHLORIDE 0.9% FLUSH
10.0000 mL | Freq: Once | INTRAVENOUS | Status: AC
  Administered 2024-08-04: 10 mL

## 2024-08-04 MED ORDER — SODIUM CHLORIDE 0.9 % IV SOLN: Freq: Once | INTRAVENOUS | Status: AC

## 2024-08-04 NOTE — Patient Instructions (Signed)
 [facility] - A DEPT OF [facility]  Discharge Instructions: Thank you for choosing [facility] to provide your oncology and hematology care.   If you have a lab appointment with the Cancer Center, please go directly to the Cancer Center and check in at the registration area.   Wear comfortable clothing and clothing appropriate for easy access to any Portacath or PICC line.   We strive to give you quality time with your provider. You may need to reschedule your appointment if you arrive late (15 or more minutes).  Arriving late affects you and other patients whose appointments are after yours.  Also, if you miss three or more appointments without notifying the office, you may be dismissed from the clinic at the provider's discretion.      For prescription refill requests, have your pharmacy contact our office and allow 72 hours for refills to be completed.    Today you received the following chemotherapy and/or immunotherapy agent: Carfilzomib (Kyprolis)      To help prevent nausea and vomiting after your treatment, we encourage you to take your nausea medication as directed.  BELOW ARE SYMPTOMS THAT SHOULD BE REPORTED IMMEDIATELY: *FEVER GREATER THAN 100.4 F (38 C) OR HIGHER *CHILLS OR SWEATING *NAUSEA AND VOMITING THAT IS NOT CONTROLLED WITH YOUR NAUSEA MEDICATION *UNUSUAL SHORTNESS OF BREATH *UNUSUAL BRUISING OR BLEEDING *URINARY PROBLEMS (pain or burning when urinating, or frequent urination) *BOWEL PROBLEMS (unusual diarrhea, constipation, pain near the anus) TENDERNESS IN MOUTH AND THROAT WITH OR WITHOUT PRESENCE OF ULCERS (sore throat, sores in mouth, or a toothache) UNUSUAL RASH, SWELLING OR PAIN  UNUSUAL VAGINAL DISCHARGE OR ITCHING   Items with * indicate a potential emergency and should be followed up as soon as possible or go to the Emergency Department if any problems should occur.  Please show the CHEMOTHERAPY ALERT CARD or  IMMUNOTHERAPY ALERT CARD at check-in to the Emergency Department and triage nurse.  Should you have questions after your visit or need to cancel or reschedule your appointment, please contact [facility] - [facility]  Dept: [phone]  and follow the prompts.  Office hours are 8:00 a.m. to 4:30 p.m. Monday - Friday. Please note that voicemails left after 4:00 p.m. may not be returned until the following business day.  We are closed weekends and major holidays. You have access to a nurse at all times for urgent questions. Please call the main number to the clinic Dept: [phone] and follow the prompts.   For any non-urgent questions, you may also contact your provider using MyChart. We now offer e-Visits for anyone 81 and older to request care online for non-urgent symptoms. For details visit [email or web address].   Also download the MyChart app! Go to the app store, search "MyChart", open the app, select [place], and log in with your MyChart username and password.

## 2024-08-06 ENCOUNTER — Inpatient Hospital Stay

## 2024-08-06 NOTE — Progress Notes (Unsigned)
 [facility] CSW Progress Note  Clinical Social Worker {method of contact:27475} to follow-up on {[facility] csw reason for visit:33083}.    Interventions: {[facility] prog note intervention:33027}      Follow Up Plan:  {CSW Follow le:75111}    Lizbeth Sprague, LCSW Clinical Social Worker [facility]    Patient is participating in a Managed Medicaid Plan:  {MM YES/NO:27447::Yes}

## 2024-08-07 ENCOUNTER — Institutional Professional Consult (permissible substitution) (HOSPITAL_BASED_OUTPATIENT_CLINIC_OR_DEPARTMENT_OTHER): Admitting: Family

## 2024-08-08 ENCOUNTER — Encounter (HOSPITAL_BASED_OUTPATIENT_CLINIC_OR_DEPARTMENT_OTHER): Payer: Self-pay

## 2024-08-08 ENCOUNTER — Encounter: Payer: Self-pay | Admitting: Hematology and Oncology

## 2024-08-11 ENCOUNTER — Encounter: Payer: Self-pay | Admitting: Hematology and Oncology

## 2024-08-11 ENCOUNTER — Inpatient Hospital Stay

## 2024-08-11 ENCOUNTER — Ambulatory Visit: Admitting: Hematology and Oncology

## 2024-08-11 VITALS — BP 140/53 | HR 72 | Temp 98.1°F | Resp 16 | Wt 140.8 lb

## 2024-08-11 DIAGNOSIS — C9000 Multiple myeloma not having achieved remission: Secondary | ICD-10-CM

## 2024-08-11 DIAGNOSIS — Z5112 Encounter for antineoplastic immunotherapy: Secondary | ICD-10-CM | POA: Diagnosis not present

## 2024-08-11 LAB — CMP (CANCER CENTER ONLY)
ALT: 18 U/L (ref 0–44)
AST: 15 U/L (ref 15–41)
Albumin: 4.4 g/dL (ref 3.5–5.0)
Alkaline Phosphatase: 78 U/L (ref 38–126)
Anion gap: 8 (ref 5–15)
BUN: 19 mg/dL (ref 8–23)
CO2: 27 mmol/L (ref 22–32)
Calcium: 9.2 mg/dL (ref 8.9–10.3)
Chloride: 105 mmol/L (ref 98–111)
Creatinine: 0.64 mg/dL (ref 0.44–1.00)
GFR, Estimated: >60 mL/min (ref >60)
Glucose, Bld: 136 mg/dL — ABNORMAL HIGH (ref 70–99)
Potassium: 4 mmol/L (ref 3.5–5.1)
Sodium: 140 mmol/L (ref 135–145)
Total Bilirubin: 1.6 mg/dL — ABNORMAL HIGH (ref 0.0–1.2)
Total Protein: 6.2 g/dL — ABNORMAL LOW (ref 6.5–8.1)

## 2024-08-11 LAB — CBC WITH DIFFERENTIAL (CANCER CENTER ONLY)
Abs Immature Granulocytes: 0.05 K/uL (ref 0.00–0.07)
Basophils Absolute: 0 K/uL (ref 0.0–0.1)
Basophils Relative: 0 %
Eosinophils Absolute: 0 K/uL (ref 0.0–0.5)
Eosinophils Relative: 0 %
HCT: 39.7 % (ref 36.0–46.0)
Hemoglobin: 13.4 g/dL (ref 12.0–15.0)
Immature Granulocytes: 1 %
Lymphocytes Relative: 7 %
Lymphs Abs: 0.7 K/uL (ref 0.7–4.0)
MCH: 32.8 pg (ref 26.0–34.0)
MCHC: 33.8 g/dL (ref 30.0–36.0)
MCV: 97.3 fL (ref 80.0–100.0)
Monocytes Absolute: 0.1 K/uL (ref 0.1–1.0)
Monocytes Relative: 1 %
Neutro Abs: 9.1 K/uL — ABNORMAL HIGH (ref 1.7–7.7)
Neutrophils Relative %: 91 %
Platelet Count: 169 K/uL (ref 150–400)
RBC: 4.08 MIL/uL (ref 3.87–5.11)
RDW: 13.2 % (ref 11.5–15.5)
WBC Count: 9.9 K/uL (ref 4.0–10.5)
nRBC: 0 % (ref 0.0–0.2)

## 2024-08-11 MED ORDER — DEXTROSE 5 % IV SOLN
70.0000 mg/m2 | Freq: Once | INTRAVENOUS | Status: AC
  Administered 2024-08-11: 120 mg via INTRAVENOUS
  Filled 2024-08-11: qty 60

## 2024-08-11 MED ORDER — SODIUM CHLORIDE 0.9% FLUSH: 10.0000 mL | INTRAVENOUS | Status: DC | PRN

## 2024-08-11 MED ORDER — SODIUM CHLORIDE 0.9 % IV SOLN: Freq: Once | INTRAVENOUS | Status: AC

## 2024-08-11 NOTE — Patient Instructions (Signed)
 [facility] - A DEPT OF [facility]  Discharge Instructions: Thank you for choosing [facility] to provide your oncology and hematology care.   If you have a lab appointment with the Cancer Center, please go directly to the Cancer Center and check in at the registration area.   Wear comfortable clothing and clothing appropriate for easy access to any Portacath or PICC line.   We strive to give you quality time with your provider. You may need to reschedule your appointment if you arrive late (15 or more minutes).  Arriving late affects you and other patients whose appointments are after yours.  Also, if you miss three or more appointments without notifying the office, you may be dismissed from the clinic at the provider's discretion.      For prescription refill requests, have your pharmacy contact our office and allow 72 hours for refills to be completed.    Today you received the following chemotherapy and/or immunotherapy agents kyprolis      To help prevent nausea and vomiting after your treatment, we encourage you to take your nausea medication as directed.  BELOW ARE SYMPTOMS THAT SHOULD BE REPORTED IMMEDIATELY: *FEVER GREATER THAN 100.4 F (38 C) OR HIGHER *CHILLS OR SWEATING *NAUSEA AND VOMITING THAT IS NOT CONTROLLED WITH YOUR NAUSEA MEDICATION *UNUSUAL SHORTNESS OF BREATH *UNUSUAL BRUISING OR BLEEDING *URINARY PROBLEMS (pain or burning when urinating, or frequent urination) *BOWEL PROBLEMS (unusual diarrhea, constipation, pain near the anus) TENDERNESS IN MOUTH AND THROAT WITH OR WITHOUT PRESENCE OF ULCERS (sore throat, sores in mouth, or a toothache) UNUSUAL RASH, SWELLING OR PAIN  UNUSUAL VAGINAL DISCHARGE OR ITCHING   Items with * indicate a potential emergency and should be followed up as soon as possible or go to the Emergency Department if any problems should occur.  Please show the CHEMOTHERAPY ALERT CARD or IMMUNOTHERAPY  ALERT CARD at check-in to the Emergency Department and triage nurse.  Should you have questions after your visit or need to cancel or reschedule your appointment, please contact [facility] - A DEPT OF [facility]  Dept: [phone]  and follow the prompts.  Office hours are 8:00 a.m. to 4:30 p.m. Monday - Friday. Please note that voicemails left after 4:00 p.m. may not be returned until the following business day.  We are closed weekends and major holidays. You have access to a nurse at all times for urgent questions. Please call the main number to the clinic Dept: [phone] and follow the prompts.   For any non-urgent questions, you may also contact your provider using MyChart. We now offer e-Visits for anyone 80 and older to request care online for non-urgent symptoms. For details visit [email or web address].   Also download the MyChart app! Go to the app store, search "MyChart", open the app, select [facility], and log in with your MyChart username and password.

## 2024-08-12 ENCOUNTER — Encounter: Payer: Self-pay | Admitting: Hematology and Oncology

## 2024-08-12 ENCOUNTER — Telehealth: Payer: Self-pay | Admitting: *Deleted

## 2024-08-12 NOTE — Telephone Encounter (Signed)
 Contacted patient in response to MyChart message stating exposure to Covid -was with daughter on Saturday who tested + for Covid on Sunday. She is requesting Paxlovid. She does not have any symptoms and has not tested positive for Covid. Dr. Federico informed of patient's questions.   Per Dr. Federico - if she has tested Covid positive, he would prescribe, but will not prescribe Paxlovid preemptively.  Advised her that usual incubation for Covid is 2 days to  2 weeks. If she develops symptoms, she should test and let PCP and this office know if +. She asked about wearing a mask - advised that wearing a mask may help protect others if she develops Covid and is asymptomatic. Ms. Aul verbalized understanding of all information.

## 2024-08-13 ENCOUNTER — Other Ambulatory Visit: Payer: Self-pay

## 2024-08-13 ENCOUNTER — Ambulatory Visit (HOSPITAL_COMMUNITY)
Admission: RE | Admit: 2024-08-13 | Discharge: 2024-08-13 | Disposition: A | Discharge status: Home / Self Care | Source: Ambulatory Visit | Attending: Hematology and Oncology | Admitting: Hematology and Oncology

## 2024-08-13 DIAGNOSIS — M8580 Other specified disorders of bone density and structure, unspecified site: Secondary | ICD-10-CM | POA: Diagnosis not present

## 2024-08-13 DIAGNOSIS — C9000 Multiple myeloma not having achieved remission: Secondary | ICD-10-CM | POA: Diagnosis not present

## 2024-08-18 ENCOUNTER — Encounter (HOSPITAL_BASED_OUTPATIENT_CLINIC_OR_DEPARTMENT_OTHER): Payer: Self-pay

## 2024-08-19 ENCOUNTER — Telehealth: Payer: Self-pay

## 2024-08-19 NOTE — Telephone Encounter (Signed)
**Note De-Identified Whitley Patchen Obfuscation** Per the [facility] website, no precertification is required when this service is performed as an outpatient procedure for a medical or surgical diagnosis (CPT Code: 95800-Itamar-HST).    I started a Itamar-HST PA through the [facility] Provider Portal and it is currently pending. Outpatient Authorization [id number]

## 2024-08-21 ENCOUNTER — Ambulatory Visit (HOSPITAL_BASED_OUTPATIENT_CLINIC_OR_DEPARTMENT_OTHER): Admitting: Pharmacist Clinician (PhC)/ Clinical Pharmacy Specialist

## 2024-08-21 VITALS — BP 88/59 | HR 86 | Ht 61.0 in | Wt 142.1 lb

## 2024-08-21 DIAGNOSIS — I10 Essential (primary) hypertension: Secondary | ICD-10-CM

## 2024-08-21 NOTE — Telephone Encounter (Signed)
**Note De-Identified Brooke Whitaker Obfuscation** Ordering provider: Reche Finder, NP Associated diagnoses: Snoring-R06.83  WatchPAT PA obtained on 08/21/2024 by Brooke Whitaker, Brooke HERO, LPN. Authorization: Letter received Brooke Whitaker fax from [facility] stating that they have approved coverage of the [name] from 08/19/2024-11/17/2024. Authorization #: [id number]. Per the [facility] website, no precertification is required when this service is performed as an outpatient procedure for a medical or surgical diagnosis (CPT Code: 95800-Brooke Whitaker).   Patient notified of PIN (1234) on 08/21/2024 Brooke Whitaker Notification Method: phone. The pt states that she will come by [name] to pick up a Brooke Whitaker device and is aware to come by any day Monday through Friday between 8 am and 4 pm.  Phone note routed to covering staff for follow-up.

## 2024-08-21 NOTE — Patient Instructions (Addendum)
 Follow up appointment: Message me tonight/tomorrow with your home BP readings and I will follow up with you via MyChart for now.   Take your BP meds as follows:  cut the telmisartan tablet in half to just 40 mg daily for now.    If you see a systolic pressure > 150, try just 1/2 of the hydralazine  50 mg tablet (total dose just 25 mg).    Check your blood pressure at home daily (if able) and keep record of the readings.  Your blood pressure goal is < 130/80  To check your pressure at home you will need to:  1. Sit up in a chair, with feet flat on the floor and back supported. Do not cross your ankles or legs. 2. Rest your left arm so that the cuff is about heart level. If the cuff goes on your upper arm,  then just relax the arm on the table, arm of the chair or your lap. If you have a wrist cuff, we  suggest relaxing your wrist against your chest (think of it as Pledging the Flag with the  wrong arm).  3. Place the cuff snugly around your arm, about 1 inch above the crook of your elbow. The  cords should be inside the groove of your elbow.  4. Sit quietly, with the cuff in place, for about 5 minutes. After that 5 minutes press the power  button to start a reading. 5. Do not talk or move while the reading is taking place.  6. Record your readings on a sheet of paper. Although most cuffs have a memory, it is often  easier to see a pattern developing when the numbers are all in front of you.  7. You can repeat the reading after 1-3 minutes if it is recommended  Make sure your bladder is empty and you have not had caffeine or tobacco within the last 30 min  Always bring your blood pressure log with you to your appointments. If you have not brought your monitor in to be double checked for accuracy, please bring it to your next appointment.  You can find a list of quality blood pressure cuffs at [email or web address]  Important lifestyle changes to control high blood pressure  Intervention   Effect on the BP  Lose extra pounds and watch your waistline Weight loss is one of the most effective lifestyle changes for controlling blood pressure. If you're overweight or obese, losing even a small amount of weight can help reduce blood pressure. Blood pressure might go down by about 1 millimeter of mercury (mm Hg) with each kilogram (about 2.2 pounds) of weight lost.  Exercise regularly As a general goal, aim for at least 30 minutes of moderate physical activity every day. Regular physical activity can lower high blood pressure by about 5 to 8 mm Hg.  Eat a healthy diet Eating a diet rich in whole grains, fruits, vegetables, and low-fat dairy products and low in saturated fat and cholesterol. A healthy diet can lower high blood pressure by up to 11 mm Hg.  Reduce salt (sodium) in your diet Even a small reduction of sodium in the diet can improve heart health and reduce high blood pressure by about 5 to 6 mm Hg.  Limit alcohol  One drink equals 12 ounces of beer, 5 ounces of wine, or 1.5 ounces of 80-proof liquor.  Limiting alcohol  to less than one drink a day for women or two drinks a day for men can help lower  blood pressure by about 4 mm Hg.   If you have any questions or concerns please use My Chart to send questions or call the office at [phone]

## 2024-08-21 NOTE — Progress Notes (Signed)
 Office Visit    Patient Name: Brooke Whitaker Date of Encounter: 08/22/2024  Primary Care Provider:  Shayne Anes, MD Primary Cardiologist:  None  Chief Complaint    Hypertension - [facility]  Past Medical History   Multiple myeloma Currently on carfilzomib  q Monday  HLD 10/25 LDL 93 on rosuvastatin 10             Allergies  Allergen Reactions   Penicillins Other (See Comments)    UNKNOWN Childhood REACTION   Sulfa Antibiotics Other (See Comments)    UNKNOWN Childhood REACTION    History of Present Illness    Brooke Whitaker is a 73 y.o. female patient who was referred to the [facility].  She has multiple myeloma for which she gets an infusion of carfilzomib  every Monday (3 weeks on, 1 week off).  She also pre-treats with dexamethasone , currently 20 mg on those Monday mornings.  She has noticed that with this regimen her BP will spike by the end of the week (usually Fridays).  Reports that she will start to develop headache on Thursday, then just lays low on Fridays because of the headache/high BP.  Currently takes telmisartan 80 mg daily with hydralazine  50 mg prn SBP > 150.  This has helped somewhat, but she notes more drops to < 100 systolic when she takes the hydralazine , which leaves her feeling poorly.     Exameth 20 mg on am of treatment  130-140's Mon - Wed Highest 159/90 , dropped after 30 minutes to < 150   Blood Pressure Goal:  130/80  Current Medications: hydralazine  50  mg q4h sbp > 150, telmisartan 80 mg hs      Social Hx:      Tobacco:  Alcohol :  Caffeine:    Diet:  3 meals per day, breakfast not always a lot  (low sugar oatmeal, cereal - blueberries/walnuts) struggles with lunch - soup, chicken salad; dinner protein and vegetables (fresh); chocolate   Exercise: walking a little, about 15-20 min  Home BP readings: Omron cuff (arm) about 57-57 years old by recall most home readings 130-140 systolic, highest last week  (Friday) was 159.  Has also had several readings < 100 systolic, especially this week as she has off week from chemo.    Accessory Clinical Findings    Lab Results  Component Value Date   CREATININE 0.64 08/11/2024   BUN 19 08/11/2024   NA 140 08/11/2024   K 4.0 08/11/2024   CL 105 08/11/2024   CO2 27 08/11/2024   Lab Results  Component Value Date   ALT 18 08/11/2024   AST 15 08/11/2024   ALKPHOS 78 08/11/2024   BILITOT 1.6 (H) 08/11/2024   No results found for: HGBA1C  Screening for Secondary Hypertension:      Relevant Labs/Studies:    Latest Ref Rng & Units 08/11/2024    1:50 PM 08/04/2024   11:37 AM 07/28/2024    1:45 PM  Basic Labs  Sodium 135 - 145 mmol/L 140  142  142   Potassium 3.5 - 5.1 mmol/L 4.0  4.1  3.7   Creatinine 0.44 - 1.00 mg/dL 9.35  9.41  9.18        Latest Ref Rng & Units 08/04/2024   11:47 AM 06/06/2024    7:54 PM  Thyroid    TSH 0.350 - 4.500 uIU/mL 1.910  2.876        Latest Ref Rng & Units 07/14/2024  11:50 AM  Renin/Aldosterone   Aldosterone 0.0 - 30.0 ng/dL 9.2                Home Medications    Current Outpatient Medications  Medication Sig Dispense Refill   acetaminophen  (TYLENOL ) 500 MG tablet Take 1,000 mg by mouth every 6 (six) hours as needed for mild pain (pain score 1-3) or headache.     acyclovir  (ZOVIRAX ) 400 MG tablet Take 400 mg by mouth in the morning.     ALPRAZolam (XANAX) 0.25 MG tablet Take 0.25 mg by mouth at bedtime as needed.     carfilzomib  (KYPROLIS ) 10 MG SOLR as directed Intravenous clarify dose. infusion is every monday 3 times a month with a break one week each month. long term med     Cholecalciferol 25 MCG (1000 UT) capsule Take 1,000 Units by mouth daily.     Cyanocobalamin  (VITAMIN B 12 PO) Take 1,000 mcg by mouth daily.     dexamethasone  (DECADRON ) 4 MG tablet Take 10 tablets (40 mg total) by mouth once a week. Take once weekly on the day you receive the chemotherapy injection. 40 tablet 5    fluticasone (FLONASE) 50 MCG/ACT nasal spray Place 2 sprays into both nostrils as needed for rhinitis or allergies.     furosemide (LASIX) 20 MG tablet Take 20 mg by mouth daily.     hydrALAZINE  (APRESOLINE ) 50 MG tablet Take 1 tablet (50 mg total) by mouth every 4 hours as needed for SBP>150. 30 tablet 5   HYDROcodone -acetaminophen  (NORCO/VICODIN) 5-325 MG tablet Take 1 tablet by mouth every 6 (six) hours as needed for moderate pain (pain score 4-6). 21 tablet 0   levocetirizine (XYZAL) 5 MG tablet Take 5 mg by mouth at bedtime.     lidocaine -prilocaine  (EMLA ) cream Apply 1 Application topically as needed. 30 g 1   ondansetron  (ZOFRAN ) 8 MG tablet Take 1 tablet (8 mg total) by mouth every 8 (eight) hours as needed. 30 tablet 0   Polyethyl Glyc-Propyl Glyc PF (SYSTANE PRESERVATIVE FREE) 0.4-0.3 % SOLN Place 1-2 drops into both eyes daily as needed (dry eyes).     potassium chloride (KLOR-CON) 10 MEQ tablet Take 10 mEq by mouth daily.     prochlorperazine  (COMPAZINE ) 10 MG tablet Take 1 tablet (10 mg total) by mouth every 6 (six) hours as needed for nausea or vomiting. 30 tablet 0   rosuvastatin (CRESTOR) 5 MG tablet Take 5 mg by mouth daily.     telmisartan (MICARDIS) 80 MG tablet Take 1 tablet (80 mg total) by mouth daily.     triamcinolone  (NASACORT  ALLERGY 24HR) 55 MCG/ACT AERO nasal inhaler Place 2 sprays into the nose daily.     No current facility-administered medications for this visit.     Assessment & Plan       Essential hypertension Assessment: BP is controlled in office BP 88/59 mmHg;  above the goal (<130/80). BP readings cycle through the week, peaking on Fridays Tolerates telmisartan well, without any side effects; hydralazine  causes some hypotension Denies SOB, palpitation, chest pain, headaches,or swelling Reiterated the importance of regular exercise and low salt diet   Plan:  Cut the telmisartan to 40 mg daily for now.   Cut the hydralazine  to 25 mg if SBP > 150 - to  determine if can lower pressure but keep > 100 systolic   She will send me her home readings via MyChart tomorrow.  We can determine if she should stay on 40 mg  of telmisartan. Patient to keep record of BP readings with heart rate and report to us  at the next visit Patient to follow up with me via MyChart for now   Labs ordered today:  none   Allean Mink PharmD CPP [facility]  [address] [place], [place] [address] [phone]

## 2024-08-22 ENCOUNTER — Other Ambulatory Visit: Payer: Self-pay

## 2024-08-22 ENCOUNTER — Encounter (HOSPITAL_BASED_OUTPATIENT_CLINIC_OR_DEPARTMENT_OTHER): Payer: Self-pay

## 2024-08-22 ENCOUNTER — Encounter (HOSPITAL_BASED_OUTPATIENT_CLINIC_OR_DEPARTMENT_OTHER): Payer: Self-pay | Admitting: Pharmacist Clinician (PhC)/ Clinical Pharmacy Specialist

## 2024-08-22 NOTE — Assessment & Plan Note (Signed)
 Assessment: BP is controlled in office BP 88/59 mmHg;  above the goal (<130/80). BP readings cycle through the week, peaking on Fridays Tolerates telmisartan well, without any side effects; hydralazine  causes some hypotension Denies SOB, palpitation, chest pain, headaches,or swelling Reiterated the importance of regular exercise and low salt diet   Plan:  Cut the telmisartan to 40 mg daily for now.   Cut the hydralazine  to 25 mg if SBP > 150 - to determine if can lower pressure but keep > 100 systolic   She will send me her home readings via MyChart tomorrow.  We can determine if she should stay on 40 mg of telmisartan. Patient to keep record of BP readings with heart rate and report to us  at the next visit Patient to follow up with me via MyChart for now   Labs ordered today:  none

## 2024-08-25 ENCOUNTER — Inpatient Hospital Stay: Attending: Hematology and Oncology

## 2024-08-25 ENCOUNTER — Ambulatory Visit

## 2024-08-25 ENCOUNTER — Inpatient Hospital Stay

## 2024-08-25 ENCOUNTER — Other Ambulatory Visit

## 2024-08-25 ENCOUNTER — Ambulatory Visit: Admitting: Hematology and Oncology

## 2024-08-25 VITALS — BP 126/56 | HR 78 | Temp 98.2°F | Resp 16 | Wt 141.8 lb

## 2024-08-25 DIAGNOSIS — Z5112 Encounter for antineoplastic immunotherapy: Secondary | ICD-10-CM | POA: Insufficient documentation

## 2024-08-25 DIAGNOSIS — C9000 Multiple myeloma not having achieved remission: Secondary | ICD-10-CM

## 2024-08-25 DIAGNOSIS — Z79899 Other long term (current) drug therapy: Secondary | ICD-10-CM | POA: Insufficient documentation

## 2024-08-25 LAB — CBC WITH DIFFERENTIAL (CANCER CENTER ONLY)
Abs Immature Granulocytes: 0.04 K/uL (ref 0.00–0.07)
Basophils Absolute: 0 K/uL (ref 0.0–0.1)
Basophils Relative: 0 %
Eosinophils Absolute: 0 K/uL (ref 0.0–0.5)
Eosinophils Relative: 0 %
HCT: 39.7 % (ref 36.0–46.0)
Hemoglobin: 13.2 g/dL (ref 12.0–15.0)
Immature Granulocytes: 0 %
Lymphocytes Relative: 6 %
Lymphs Abs: 0.6 K/uL — ABNORMAL LOW (ref 0.7–4.0)
MCH: 32.4 pg (ref 26.0–34.0)
MCHC: 33.2 g/dL (ref 30.0–36.0)
MCV: 97.5 fL (ref 80.0–100.0)
Monocytes Absolute: 0.1 K/uL (ref 0.1–1.0)
Monocytes Relative: 1 %
Neutro Abs: 9.4 K/uL — ABNORMAL HIGH (ref 1.7–7.7)
Neutrophils Relative %: 93 %
Platelet Count: 251 K/uL (ref 150–400)
RBC: 4.07 MIL/uL (ref 3.87–5.11)
RDW: 13 % (ref 11.5–15.5)
WBC Count: 10.2 K/uL (ref 4.0–10.5)
nRBC: 0 % (ref 0.0–0.2)

## 2024-08-25 LAB — CMP (CANCER CENTER ONLY)
ALT: 19 U/L (ref 0–44)
AST: 18 U/L (ref 15–41)
Albumin: 4.3 g/dL (ref 3.5–5.0)
Alkaline Phosphatase: 81 U/L (ref 38–126)
Anion gap: 6 (ref 5–15)
BUN: 14 mg/dL (ref 8–23)
CO2: 29 mmol/L (ref 22–32)
Calcium: 9 mg/dL (ref 8.9–10.3)
Chloride: 107 mmol/L (ref 98–111)
Creatinine: 0.65 mg/dL (ref 0.44–1.00)
GFR, Estimated: >60 mL/min (ref >60)
Glucose, Bld: 146 mg/dL — ABNORMAL HIGH (ref 70–99)
Potassium: 3.9 mmol/L (ref 3.5–5.1)
Sodium: 142 mmol/L (ref 135–145)
Total Bilirubin: 1.6 mg/dL — ABNORMAL HIGH (ref 0.0–1.2)
Total Protein: 6 g/dL — ABNORMAL LOW (ref 6.5–8.1)

## 2024-08-25 MED ORDER — DEXTROSE 5 % IV SOLN
70.0000 mg/m2 | Freq: Once | INTRAVENOUS | Status: AC
  Administered 2024-08-25: 120 mg via INTRAVENOUS
  Filled 2024-08-25: qty 60

## 2024-08-25 MED ORDER — SODIUM CHLORIDE 0.9 % IV SOLN: INTRAVENOUS | Status: DC

## 2024-08-25 MED ORDER — SODIUM CHLORIDE 0.9 % IV SOLN: Freq: Once | INTRAVENOUS | Status: AC

## 2024-08-25 NOTE — Patient Instructions (Signed)
 [facility] - A DEPT OF [facility]  Discharge Instructions: Thank you for choosing [facility] to provide your oncology and hematology care.   If you have a lab appointment with the Cancer Center, please go directly to the Cancer Center and check in at the registration area.   Wear comfortable clothing and clothing appropriate for easy access to any Portacath or PICC line.   We strive to give you quality time with your provider. You may need to reschedule your appointment if you arrive late (15 or more minutes).  Arriving late affects you and other patients whose appointments are after yours.  Also, if you miss three or more appointments without notifying the office, you may be dismissed from the clinic at the provider's discretion.      For prescription refill requests, have your pharmacy contact our office and allow 72 hours for refills to be completed.    Today you received the following chemotherapy and/or immunotherapy agents kyprolis      To help prevent nausea and vomiting after your treatment, we encourage you to take your nausea medication as directed.  BELOW ARE SYMPTOMS THAT SHOULD BE REPORTED IMMEDIATELY: *FEVER GREATER THAN 100.4 F (38 C) OR HIGHER *CHILLS OR SWEATING *NAUSEA AND VOMITING THAT IS NOT CONTROLLED WITH YOUR NAUSEA MEDICATION *UNUSUAL SHORTNESS OF BREATH *UNUSUAL BRUISING OR BLEEDING *URINARY PROBLEMS (pain or burning when urinating, or frequent urination) *BOWEL PROBLEMS (unusual diarrhea, constipation, pain near the anus) TENDERNESS IN MOUTH AND THROAT WITH OR WITHOUT PRESENCE OF ULCERS (sore throat, sores in mouth, or a toothache) UNUSUAL RASH, SWELLING OR PAIN  UNUSUAL VAGINAL DISCHARGE OR ITCHING   Items with * indicate a potential emergency and should be followed up as soon as possible or go to the Emergency Department if any problems should occur.  Please show the CHEMOTHERAPY ALERT CARD or IMMUNOTHERAPY  ALERT CARD at check-in to the Emergency Department and triage nurse.  Should you have questions after your visit or need to cancel or reschedule your appointment, please contact [facility] - A DEPT OF [facility]  Dept: [phone]  and follow the prompts.  Office hours are 8:00 a.m. to 4:30 p.m. Monday - Friday. Please note that voicemails left after 4:00 p.m. may not be returned until the following business day.  We are closed weekends and major holidays. You have access to a nurse at all times for urgent questions. Please call the main number to the clinic Dept: [phone] and follow the prompts.   For any non-urgent questions, you may also contact your provider using MyChart. We now offer e-Visits for anyone 80 and older to request care online for non-urgent symptoms. For details visit [email or web address].   Also download the MyChart app! Go to the app store, search "MyChart", open the app, select [facility], and log in with your MyChart username and password.

## 2024-08-25 NOTE — Progress Notes (Signed)
 Pt took steroid at home.

## 2024-08-27 ENCOUNTER — Inpatient Hospital Stay

## 2024-08-27 LAB — KAPPA/LAMBDA LIGHT CHAINS
Kappa free light chain: 1.3 mg/L — ABNORMAL LOW (ref 3.3–19.4)
Kappa, lambda light chain ratio: 0.02 — ABNORMAL LOW (ref 0.26–1.65)
Lambda free light chains: 53.2 mg/L — ABNORMAL HIGH (ref 5.7–26.3)

## 2024-08-27 NOTE — Progress Notes (Signed)
 [facility] CSW Progress Note  Visual Merchandiser met with patient briefly for scheduled session. At time of visit patient reported a strong headache following the treatment on Monday. Patient discussed feeling an improvement in mood following recent beach trip with her family. However, during treatment continues to have thoughts about finality of life. CSW encouraged patient to receive support from [facility] team, as spiritual care has more experience with delving into presenting topics. CSW briefly staffed with [facility] spiritual care.    Follow Up Plan:  Patient will contact CSW with any support or resource needs    Lizbeth Sprague, LCSW Clinical Social Worker [facility]

## 2024-08-30 LAB — MULTIPLE MYELOMA PANEL, SERUM
Albumin SerPl Elph-Mcnc: 3.6 g/dL (ref 2.9–4.4)
Albumin/Glob SerPl: 1.9 — ABNORMAL HIGH (ref 0.7–1.7)
Alpha 1: 0.3 g/dL (ref 0.0–0.4)
Alpha2 Glob SerPl Elph-Mcnc: 0.7 g/dL (ref 0.4–1.0)
B-Globulin SerPl Elph-Mcnc: 0.8 g/dL (ref 0.7–1.3)
Gamma Glob SerPl Elph-Mcnc: 0.2 g/dL — ABNORMAL LOW (ref 0.4–1.8)
Globulin, Total: 2 g/dL — ABNORMAL LOW (ref 2.2–3.9)
IgA: <5 mg/dL — ABNORMAL LOW (ref 64–422)
IgG (Immunoglobin G), Serum: 156 mg/dL — ABNORMAL LOW (ref 586–1602)
IgM (Immunoglobulin M), Srm: <5 mg/dL — ABNORMAL LOW (ref 26–217)
Total Protein ELP: 5.6 g/dL — ABNORMAL LOW (ref 6.0–8.5)

## 2024-09-01 ENCOUNTER — Other Ambulatory Visit

## 2024-09-01 ENCOUNTER — Inpatient Hospital Stay: Admitting: Hematology and Oncology

## 2024-09-01 ENCOUNTER — Encounter: Payer: Self-pay | Admitting: General Practice

## 2024-09-01 ENCOUNTER — Ambulatory Visit

## 2024-09-01 ENCOUNTER — Inpatient Hospital Stay

## 2024-09-01 VITALS — BP 147/67 | HR 81 | Temp 97.9°F | Resp 16 | Wt 142.2 lb

## 2024-09-01 DIAGNOSIS — C9000 Multiple myeloma not having achieved remission: Secondary | ICD-10-CM

## 2024-09-01 DIAGNOSIS — Z95828 Presence of other vascular implants and grafts: Secondary | ICD-10-CM | POA: Diagnosis not present

## 2024-09-01 DIAGNOSIS — Z5112 Encounter for antineoplastic immunotherapy: Secondary | ICD-10-CM | POA: Diagnosis not present

## 2024-09-01 LAB — CMP (CANCER CENTER ONLY)
ALT: 19 U/L (ref 0–44)
AST: 18 U/L (ref 15–41)
Albumin: 4.4 g/dL (ref 3.5–5.0)
Alkaline Phosphatase: 79 U/L (ref 38–126)
Anion gap: 7 (ref 5–15)
BUN: 24 mg/dL — ABNORMAL HIGH (ref 8–23)
CO2: 27 mmol/L (ref 22–32)
Calcium: 9 mg/dL (ref 8.9–10.3)
Chloride: 107 mmol/L (ref 98–111)
Creatinine: 0.66 mg/dL (ref 0.44–1.00)
GFR, Estimated: >60 mL/min (ref >60)
Glucose, Bld: 127 mg/dL — ABNORMAL HIGH (ref 70–99)
Potassium: 4 mmol/L (ref 3.5–5.1)
Sodium: 141 mmol/L (ref 135–145)
Total Bilirubin: 1.6 mg/dL — ABNORMAL HIGH (ref 0.0–1.2)
Total Protein: 6 g/dL — ABNORMAL LOW (ref 6.5–8.1)

## 2024-09-01 LAB — CBC WITH DIFFERENTIAL (CANCER CENTER ONLY)
Abs Immature Granulocytes: 0.03 K/uL (ref 0.00–0.07)
Basophils Absolute: 0 K/uL (ref 0.0–0.1)
Basophils Relative: 0 %
Eosinophils Absolute: 0 K/uL (ref 0.0–0.5)
Eosinophils Relative: 0 %
HCT: 40 % (ref 36.0–46.0)
Hemoglobin: 13.3 g/dL (ref 12.0–15.0)
Immature Granulocytes: 0 %
Lymphocytes Relative: 9 %
Lymphs Abs: 0.8 K/uL (ref 0.7–4.0)
MCH: 32.5 pg (ref 26.0–34.0)
MCHC: 33.3 g/dL (ref 30.0–36.0)
MCV: 97.8 fL (ref 80.0–100.0)
Monocytes Absolute: 0.1 K/uL (ref 0.1–1.0)
Monocytes Relative: 1 %
Neutro Abs: 7.9 K/uL — ABNORMAL HIGH (ref 1.7–7.7)
Neutrophils Relative %: 90 %
Platelet Count: 136 K/uL — ABNORMAL LOW (ref 150–400)
RBC: 4.09 MIL/uL (ref 3.87–5.11)
RDW: 13.1 % (ref 11.5–15.5)
WBC Count: 8.8 K/uL (ref 4.0–10.5)
nRBC: 0 % (ref 0.0–0.2)

## 2024-09-01 MED ORDER — DEXTROSE 5 % IV SOLN
70.0000 mg/m2 | Freq: Once | INTRAVENOUS | Status: AC
  Administered 2024-09-01: 120 mg via INTRAVENOUS
  Filled 2024-09-01: qty 60

## 2024-09-01 MED ORDER — SODIUM CHLORIDE 0.9 % IV SOLN: INTRAVENOUS | Status: DC

## 2024-09-01 MED ORDER — SODIUM CHLORIDE 0.9 % IV SOLN: Freq: Once | INTRAVENOUS | Status: AC

## 2024-09-01 NOTE — Progress Notes (Signed)
 Patient states she took Dexamethasone  at 1430 today prior to appt.

## 2024-09-01 NOTE — Progress Notes (Signed)
 [facility] [phone]   [phone]  PROGRESS NOTE  Patient Care Team: Shayne Anes, MD as PCP - General (Internal Medicine)  Hematological/Oncological History # Free Lambda Multiple Myeloma 11/10/2022: WBC 5.68, Hgb 11.7, MCV 97.4, Plt 146 04/17/2023: WBC 4.53, Hgb 11.5, MCV 107.6, Plt 110 05/16/2023: establish care with Dr. Federico. Labs showed M protein 0.1 with Lambda 900, Kappa 3 with ratio 0.00.   06/01/2023: bone marrow biopsy performed, showed plasma cell myeloma nearly effacing approximately 80% of the cellular marrow.  06/25/2023: Cycle 1 Day 1 of VRD chemotherapy 07/23/2023: Cycle 2 Day 1 of VRD chemotherapy 08/13/2023: Cycle 3 Day 1 of VRD chemotherapy 09/03/2023: Cycle 1 Day 1 of Dara/Rev/Dex. 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 12/17/2023: Cycle 1 Day 1 of Kyprolis . Dara/Rev ineffective.   Interval History:  Brooke Whitaker 73 y.o. female with medical history significant for newly diagnosed multiple myeloma who presents for a follow up visit. The patient's last visit was on 08/04/2024. In the interim since the last visit she has had no major changes in her health.  On exam today Brooke Whitaker reports she has been well overall in the interim since her last visit and reports she feels quite well.  She reports that typically on a Saturday through Monday she feels quite well and that her fatigue is mostly immediately after treatment.  She does also have some occasional shortness of breath.  She notes that she is not having as severe of a headache with her dexamethasone  therapy.  She reports that she has had no trouble with nausea, vomiting, or diarrhea.  She denies any numbness or tingling of her fingers and toes.  She reports her appetite is good but she feels like she is not eating enough veggies.  Her weight has been steady and she has had no issues with fevers, chills, sweats.  Overall she feels well and is willing and able to proceed with  treatment at this time.  Full 10 point ROS otherwise negative.    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    Sigmoid diverticulosis    Wears glasses     SURGICAL HISTORY: Past Surgical History:  Procedure Laterality Date   BREAST BIOPSY Right 09/2011   benign    COLONOSCOPY  2010   CYSTOSCOPY/RETROGRADE/URETEROSCOPY/STONE EXTRACTION WITH BASKET Left 04/21/2014   Procedure: cystoscopy, left retrograde pylegram, LEFT URETEROSCOPY, laser lithotripsy with holmium laser, STONE EXTRACTION, uretheral calibration;  Surgeon: Norleen JINNY Seltzer, MD;  Location: [facility];  Service: Urology;  Laterality: Left;   DILATION AND EVACUATION  1995   EPISIOTOMY REPAIR, SEVERE TEAR  1989   IR IMAGING GUIDED PORT INSERTION  02/04/2024   LAPAROSCOPIC OVARIAN CYSTECTOMY  10/ 2000   NM MYOCAR PERF WALL MOTION  12/15/2009   Normal   US  ECHOCARDIOGRAPHY  12/15/2009   Normal study for age: mild MR,TR and trace PI    SOCIAL HISTORY: Social History   Socioeconomic History   Marital status: Married    Spouse name: Not on file   Number of children: 2   Years of education: Not on file   Highest education level: Not on file  Occupational History   Occupation: Print Production Planner  Tobacco Use   Smoking  status: Never    Passive exposure: Never   Smokeless tobacco: Never  Vaping Use   Vaping status: Never Used  Substance and Sexual Activity   Alcohol  use: Yes    Alcohol /week: 2.0 standard drinks of alcohol     Types: 2 Glasses of wine per week   Drug use: No   Sexual activity: Not Currently    Partners: Male  Other Topics Concern   Not on file  Social History Narrative   Not on file   Social Drivers of Health   Financial Resource Strain: Not on file  Food Insecurity: No Food Insecurity (05/16/2023)   Hunger  Vital Sign    Worried About Running Out of Food in the Last Year: Never true    Ran Out of Food in the Last Year: Never true  Transportation Needs: No Transportation Needs (05/16/2023)   PRAPARE - Administrator, Civil Service (Medical): No    Lack of Transportation (Non-Medical): No  Physical Activity: Not on file  Stress: Not on file  Social Connections: Not on file  Intimate Partner Violence: Not At Risk (05/16/2023)   Humiliation, Afraid, Rape, and Kick questionnaire    Fear of Current or Ex-Partner: No    Emotionally Abused: No    Physically Abused: No    Sexually Abused: No    FAMILY HISTORY: Family History  Problem Relation Age of Onset   Diabetes Mother    Congestive Heart Failure Mother    Stroke Mother    Osteoporosis Mother    Other Mother        gallbladder ruptured   Heart attack Father        initial in 35s, and massiv MI at 81   Heart attack Sister        fatal MI   Diabetes Sister    Heart disease Sister    Heart disease Sister        x2   Hypertension Sister    Thyroid  disease Sister    Hypertension Sister    Diabetes Brother    Dementia Brother    Colon cancer Brother        mets to liver   Heart disease Brother        pacemaker   Stroke Brother    Liver cancer Brother    Heart disease Brother    Stomach cancer Neg Hx    Pancreatic cancer Neg Hx    Esophageal cancer Neg Hx     ALLERGIES:  is allergic to penicillins and sulfa antibiotics.  MEDICATIONS:  Current Outpatient Medications  Medication Sig Dispense Refill   acetaminophen  (TYLENOL ) 500 MG tablet Take 1,000 mg by mouth every 6 (six) hours as needed for mild pain (pain score 1-3) or headache.     acyclovir  (ZOVIRAX ) 400 MG tablet Take 400 mg by mouth in the morning.     ALPRAZolam (XANAX) 0.25 MG tablet Take 0.25 mg by mouth at bedtime as needed.     carfilzomib  (KYPROLIS ) 10 MG SOLR as directed Intravenous clarify dose. infusion is every monday 3 times a month with a break  one week each month. long term med     Cholecalciferol 25 MCG (1000 UT) capsule Take 1,000 Units by mouth daily.     Cyanocobalamin  (VITAMIN B 12 PO) Take 1,000 mcg by mouth daily.     dexamethasone  (DECADRON ) 4 MG tablet Take 10 tablets (40 mg total) by mouth once a week. Take once weekly on the day  you receive the chemotherapy injection. 40 tablet 5   fluticasone (FLONASE) 50 MCG/ACT nasal spray Place 2 sprays into both nostrils as needed for rhinitis or allergies.     furosemide (LASIX) 20 MG tablet Take 20 mg by mouth daily.     hydrALAZINE  (APRESOLINE ) 50 MG tablet Take 1 tablet (50 mg total) by mouth every 4 hours as needed for SBP>150. 30 tablet 5   HYDROcodone -acetaminophen  (NORCO/VICODIN) 5-325 MG tablet Take 1 tablet by mouth every 6 (six) hours as needed for moderate pain (pain score 4-6). 21 tablet 0   levocetirizine (XYZAL) 5 MG tablet Take 5 mg by mouth at bedtime.     lidocaine -prilocaine  (EMLA ) cream Apply 1 Application topically as needed. 30 g 1   ondansetron  (ZOFRAN ) 8 MG tablet Take 1 tablet (8 mg total) by mouth every 8 (eight) hours as needed. 30 tablet 0   Polyethyl Glyc-Propyl Glyc PF (SYSTANE PRESERVATIVE FREE) 0.4-0.3 % SOLN Place 1-2 drops into both eyes daily as needed (dry eyes).     potassium chloride (KLOR-CON) 10 MEQ tablet Take 10 mEq by mouth daily.     prochlorperazine  (COMPAZINE ) 10 MG tablet Take 1 tablet (10 mg total) by mouth every 6 (six) hours as needed for nausea or vomiting. 30 tablet 0   rosuvastatin (CRESTOR) 5 MG tablet Take 5 mg by mouth daily.     telmisartan (MICARDIS) 80 MG tablet Take 1 tablet (80 mg total) by mouth daily.     triamcinolone  (NASACORT  ALLERGY 24HR) 55 MCG/ACT AERO nasal inhaler Place 2 sprays into the nose daily.     No current facility-administered medications for this visit.   Facility-Administered Medications Ordered in Other Visits  Medication Dose Route Frequency Provider Last Rate Last Admin   0.9 %  sodium chloride   infusion   Intravenous Continuous Federico Norleen ONEIDA MADISON, MD   Stopped at 09/01/24 1612    REVIEW OF SYSTEMS:   Constitutional: ( - ) fevers, ( - )  chills , ( - ) night sweats Eyes: ( - ) blurriness of vision, ( - ) double vision, ( - ) watery eyes Ears, nose, mouth, throat, and face: ( - ) mucositis, ( - ) sore throat Respiratory: ( - ) cough, ( - ) dyspnea, ( - ) wheezes Cardiovascular: ( - ) palpitation, ( - ) chest discomfort, ( - ) lower extremity swelling Gastrointestinal:  ( - ) nausea, ( - ) heartburn, ( - ) change in bowel habits Skin: ( - ) abnormal skin rashes Lymphatics: ( - ) new lymphadenopathy, ( - ) easy bruising Neurological: ( - ) numbness, ( - ) tingling, ( - ) new weaknesses Behavioral/Psych: ( - ) mood change, ( - ) new changes  All other systems were reviewed with the patient and are negative.  PHYSICAL EXAMINATION: ECOG PERFORMANCE STATUS: 0 - Asymptomatic  Vitals:   09/01/24 1359  BP: (!) 147/67  Pulse: 81  Resp: 16  Temp: 97.9 F (36.6 C)  SpO2: 100%      Filed Weights   09/01/24 1359  Weight: 142 lb 3.2 oz (64.5 kg)       GENERAL: Well-appearing elderly Caucasian female, alert, no distress and comfortable SKIN: skin color, texture, turgor are normal, no rashes or significant lesions EYES: conjunctiva are pink and non-injected, sclera clear LUNGS: clear to auscultation and percussion with normal breathing effort HEART: regular rate & rhythm and no murmurs and no lower extremity edema Musculoskeletal: no cyanosis of digits and no  clubbing  PSYCH: alert & oriented x 3, fluent speech NEURO: no focal motor/sensory deficits  LABORATORY DATA:  I have reviewed the data as listed    Latest Ref Rng & Units 09/01/2024    1:37 PM 08/25/2024   12:37 PM 08/11/2024    1:50 PM  CBC  WBC 4.0 - 10.5 K/uL 8.8  10.2  9.9   Hemoglobin 12.0 - 15.0 g/dL 86.6  86.7  86.5   Hematocrit 36.0 - 46.0 % 40.0  39.7  39.7   Platelets 150 - 400 K/uL 136  251  169         Latest Ref Rng & Units 09/01/2024    1:37 PM 08/25/2024   12:37 PM 08/11/2024    1:50 PM  CMP  Glucose 70 - 99 mg/dL 872  853  863   BUN 8 - 23 mg/dL 24  14  19    Creatinine 0.44 - 1.00 mg/dL 9.33  9.34  9.35   Sodium 135 - 145 mmol/L 141  142  140   Potassium 3.5 - 5.1 mmol/L 4.0  3.9  4.0   Chloride 98 - 111 mmol/L 107  107  105   CO2 22 - 32 mmol/L 27  29  27    Calcium 8.9 - 10.3 mg/dL 9.0  9.0  9.2   Total Protein 6.5 - 8.1 g/dL 6.0  6.0  6.2   Total Bilirubin 0.0 - 1.2 mg/dL 1.6  1.6  1.6   Alkaline Phos 38 - 126 U/L 79  81  78   AST 15 - 41 U/L 18  18  15    ALT 0 - 44 U/L 19  19  18      Lab Results  Component Value Date   MPROTEIN Not Observed 08/25/2024   MPROTEIN Not Observed 07/28/2024   MPROTEIN Not Observed 06/30/2024   Lab Results  Component Value Date   KPAFRELGTCHN 1.3 (L) 08/25/2024   KPAFRELGTCHN 1.3 (L) 07/28/2024   KPAFRELGTCHN 1.4 (L) 06/30/2024   LAMBDASER 53.2 (H) 08/25/2024   LAMBDASER 56.0 (H) 07/28/2024   LAMBDASER 53.6 (H) 06/30/2024   KAPLAMBRATIO 0.02 (L) 08/25/2024   KAPLAMBRATIO 0.02 (L) 07/28/2024   KAPLAMBRATIO 0.03 (L) 06/30/2024    RADIOGRAPHIC STUDIES: DG Bone Survey Met Result Date: 08/15/2024 EXAM: BONE SURVEY RADIOGRAPHS, _VIEWS_ VIEWS 08/13/2024 02:24:45 PM TECHNIQUE: Multiple AP and lateral xray views of the axial skeleton and AP views of the appendicular skeleton were performed. COMPARISON: Metastatic bone survey study from 06/15/2023. CLINICAL HISTORY: Assess for progression of MM bone lesions. Multiple myeloma not having achieved remission - pt denied any abnormal pain or complications at time of imaging. FINDINGS: Skull: No lytic or blastic lesions identified. Spine: No lytic or blastic lesions identified. There is osteopenia, degenerative spondylosis and facet spurring in the cervical spine, kyphosis and degenerative change of the thoracic spine, with facet hypertrophy and marginal osteophytes of the lumbar region with  multilevel degenerative disc disease. Chest: No lytic or blastic lesions identified. The lungs are clear. The cardiomediastinal silhouette and vessel markings are normal. There is calcification in the aortic arch. Pelvis and Hips: No lytic or blastic lesions identified. Upper Limbs: No lytic or blastic lesions identified. Lower Limbs: No lytic or blastic lesions identified. LINES AND TUBES: Since the prior study, a right chest port has been implanted, with an IJ approach catheter terminating at the superior cavoatrial junction. JOINTS: There is moderate to severe generalized joint space loss of the right hip asymmetrically, with acetabular and  femoral head osteophytes. There is chondrocalcinosis in the symphysis pubis and knees, and mild bilateral knee degenerative joint disease (DJD). IMPRESSION: 1. No lytic or blastic bone lesions identified. 2. 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 3. Chondrocalcinosis in the pubic symphysis and knees. 4. Generalized Osteopenia. 5. Aortic arch calcification. Electronically signed by: Francis Quam MD 08/15/2024 12:53 AM EDT RP Workstation: [id number]      ASSESSMENT & PLAN Rock [name] 73 y.o. female with medical history significant for newly diagnosed multiple myeloma who presents for a follow up visit.   Previously we discussed the diagnosis of multiple myeloma.  We discussed that this is a blood cancer that is not curable.  The goal is to get the patient into a durable remission with treatment.  We discussed the need for a minimum of 6 months of chemotherapy treatment then followed by maintenance therapy if the M protein has reached 0 and the free light chains are within normal limits.  We discussed expected side effects of the regimen and the plan moving forward.  The patient voiced her understanding of our findings, expected side effects, and risks/benefits.  She is okay with  proceeding with chemotherapy at this time.  # Free Lambda Multiple Myeloma -- Diagnosis confirmed by the presence of a macrocytic anemia and 80% plasma cells in the bone marrow --Initially started with VRD chemotherapy with the patient developed rash as result of the Revlimid  and ocular toxicity from the Velcade .  Dropped the Revlimid  down to 10 mg p.o. daily and transition to Darzalex  for her next treatment. --UPEP showed marked Bence Jones proteins in the urine, no evidence of lytic lesions on metastatic survey PLAN: -- Continue Kyprolis /Dex therapy.  Today is Cycle 10 Day 8 --Labs today show white blood cell 8.8, hemoglobin 13.3, MCV 97.8, platelets 136.  LFTs and creatinine within normal limits.  --On 07/28/2024 patient had kappa 1.3, lambda 56.0, ratio 0.02 with an M protein undetectable. -- Offer the addition of Pomalyst but the patient declined and want to continue on Kyprolis  alone.  She is aware of the rising lambda chains. -- Plan for return to clinic in 2 weeks with interval continued Kyprolis  therapy.  #Supportive Care -- chemotherapy education complete -- port placed -- zofran  8mg  q8H PRN and compazine  10mg  PO q6H for nausea -- acyclovir  400mg  PO BID for VCZ prophylaxis -- EMLA  cream for port -- Zometa to be added after dental clearance.  Discussed with patient today and she notes that she will be evaluated by her dentist to begin this. -- no pain medication required at this time.   No orders of the defined types were placed in this encounter.  All questions were answered. The patient knows to call the clinic with any problems, questions or concerns.  A total of more than 30 minutes were spent on this encounter with face-to-face time and non-face-to-face time, including preparing to see the patient, ordering tests and/or medications, counseling the patient and coordination of care as outlined above.   Norleen IVAR Kidney, MD Department of Hematology/Oncology [facility] Phone: [phone] Pager: [phone] Email: [email or web address]  09/01/2024 4:44 PM

## 2024-09-01 NOTE — Patient Instructions (Signed)
 [facility] - A DEPT OF [facility]  Discharge Instructions: Thank you for choosing [facility] to provide your oncology and hematology care.   If you have a lab appointment with the Cancer Center, please go directly to the Cancer Center and check in at the registration area.   Wear comfortable clothing and clothing appropriate for easy access to any Portacath or PICC line.   We strive to give you quality time with your provider. You may need to reschedule your appointment if you arrive late (15 or more minutes).  Arriving late affects you and other patients whose appointments are after yours.  Also, if you miss three or more appointments without notifying the office, you may be dismissed from the clinic at the provider's discretion.      For prescription refill requests, have your pharmacy contact our office and allow 72 hours for refills to be completed.    Today you received the following chemotherapy and/or immunotherapy agents kyprolis      To help prevent nausea and vomiting after your treatment, we encourage you to take your nausea medication as directed.  BELOW ARE SYMPTOMS THAT SHOULD BE REPORTED IMMEDIATELY: *FEVER GREATER THAN 100.4 F (38 C) OR HIGHER *CHILLS OR SWEATING *NAUSEA AND VOMITING THAT IS NOT CONTROLLED WITH YOUR NAUSEA MEDICATION *UNUSUAL SHORTNESS OF BREATH *UNUSUAL BRUISING OR BLEEDING *URINARY PROBLEMS (pain or burning when urinating, or frequent urination) *BOWEL PROBLEMS (unusual diarrhea, constipation, pain near the anus) TENDERNESS IN MOUTH AND THROAT WITH OR WITHOUT PRESENCE OF ULCERS (sore throat, sores in mouth, or a toothache) UNUSUAL RASH, SWELLING OR PAIN  UNUSUAL VAGINAL DISCHARGE OR ITCHING   Items with * indicate a potential emergency and should be followed up as soon as possible or go to the Emergency Department if any problems should occur.  Please show the CHEMOTHERAPY ALERT CARD or IMMUNOTHERAPY  ALERT CARD at check-in to the Emergency Department and triage nurse.  Should you have questions after your visit or need to cancel or reschedule your appointment, please contact [facility] - A DEPT OF [facility]  Dept: [phone]  and follow the prompts.  Office hours are 8:00 a.m. to 4:30 p.m. Monday - Friday. Please note that voicemails left after 4:00 p.m. may not be returned until the following business day.  We are closed weekends and major holidays. You have access to a nurse at all times for urgent questions. Please call the main number to the clinic Dept: [phone] and follow the prompts.   For any non-urgent questions, you may also contact your provider using MyChart. We now offer e-Visits for anyone 80 and older to request care online for non-urgent symptoms. For details visit [email or web address].   Also download the MyChart app! Go to the app store, search "MyChart", open the app, select [facility], and log in with your MyChart username and password.

## 2024-09-01 NOTE — Progress Notes (Signed)
 SPIRITUAL CARE AND COUNSELING CONSULT NOTE   VISIT SUMMARY Met Ms Jeon and her son in infusion to introduce Spiritual Care as part of their support team, per referral from [facility] Abdul/LCSW. We plan to speak in more detail by phone, which will offer a more private (and less dozy) environment.  SPIRITUAL ENCOUNTER                                                                                                                                                                      Type of Visit: Initial Care provided to:: Pt and family (son at chairside in infusion) Referral source: Social worker/Care management/TOC Reason for visit: Routine spiritual support   SPIRITUAL FRAMEWORK  Presenting Themes: Impactful experiences and emotions Strengths: Interest in support/resources Patient Stress Factors: Health changes   GOALS   Self/Personal Goals: Patient plans to contact chaplain for follow-up support   INTERVENTIONS   Spiritual Care Interventions Made: Established relationship of care and support    INTERVENTION OUTCOMES   Outcomes: Connection to spiritual care  SPIRITUAL CARE PLAN   Spiritual Care Issues Still Outstanding: Chaplain will continue to follow Follow up plan : Patient to phone chaplain when desired    Chaplain Olam Filiberto Lemming, [facility] Pager [phone] Voicemail [phone]

## 2024-09-04 NOTE — Telephone Encounter (Signed)
 Placed a phone call to the pt about [facility] completion.   Pt states she hasn't picked the [facility] device up yet from our [facility] clinic.    Pt states she has been doing 3 weeks worth of chemo, and wanted to wait to come and pick the device up on her week break, the week of Thanksgiving.  Pt states she plans on picking the watchpat device up next Monday from [facility] location (5th floor).  Informed the pt that would be perfectly fine and I will make our sleep team aware of this.   Pt verbalized understanding and agrees with this plan.

## 2024-09-08 ENCOUNTER — Encounter (HOSPITAL_BASED_OUTPATIENT_CLINIC_OR_DEPARTMENT_OTHER): Payer: Self-pay | Admitting: Cardiology

## 2024-09-08 ENCOUNTER — Other Ambulatory Visit

## 2024-09-08 ENCOUNTER — Ambulatory Visit: Admitting: Hematology and Oncology

## 2024-09-08 ENCOUNTER — Ambulatory Visit

## 2024-09-08 DIAGNOSIS — R0683 Snoring: Secondary | ICD-10-CM | POA: Diagnosis not present

## 2024-09-08 NOTE — Telephone Encounter (Signed)
 Device picked up by patient today. All questions (if any) were answered. Brooke Whitaker, CMA 09/08/2024 2:32 PM.

## 2024-09-09 ENCOUNTER — Ambulatory Visit: Attending: Family

## 2024-09-09 DIAGNOSIS — R400 Somnolence: Secondary | ICD-10-CM

## 2024-09-09 DIAGNOSIS — I10 Essential (primary) hypertension: Secondary | ICD-10-CM

## 2024-09-09 DIAGNOSIS — R0683 Snoring: Secondary | ICD-10-CM

## 2024-09-09 NOTE — Procedures (Signed)
   SLEEP STUDY REPORT Patient Information Study Date: 09/08/2024 Patient Name: Brooke Whitaker Patient ID: [id number] Birth Date: 10/08/1951 Age: 73 Gender: Female BMI: 26.6 (W=141 lb, H=5' 1'' Referring Physician: CAITLIN WALKER, NP  TEST DESCRIPTION: Home sleep apnea testing was completed using the WatchPat, a Type 1 device, utilizing  peripheral arterial tonometry (PAT), chest movement, actigraphy, pulse oximetry, pulse rate, body position and snore.  AHI was calculated with apnea and hypopnea using valid sleep time as the denominator. RDI includes apneas,  hypopneas, and RERAs. The data acquired and the scoring of sleep and all associated events were performed in  accordance with the recommended standards and specifications as outlined in the AASM Manual for the Scoring of  Sleep and Associated Events 2.2.0 (2015).   FINDINGS: 1. No evidence of Obstructive Sleep Apnea with AHI 0.3/hr.  2. No Central Sleep Apnea. 3. Oxygen desaturations as low as 92%. 4. Mild to moderate snoring was present. O2 sats were < 88% for 0 minutes. 5. Total sleep time was 8 hrs and 9 min. 6. 30.2% of total sleep time was spent in REM sleep.  7. Normal sleep onset latency at 10 min.  8. Shortened REM sleep onset latency at 67 min.  9. Total awakenings were 6.   DIAGNOSIS:  Normal study with no significant sleep disordered breathing.  RECOMMENDATIONS: 1. Normal study with no significant sleep disordered breathing. 2. Healthy sleep recommendations include: adequate nightly sleep (normal 7-9 hrs/night), avoidance of caffeine after  noon and alcohol  near bedtime, and maintaining a sleep environment that is cool, dark and quiet. 3. Weight loss for overweight patients is recommended.  4. Snoring recommendations include: weight loss where appropriate, side sleeping, and avoidance of alcohol  before  bed. 5. Operation of motor vehicle or dangerous equipment must be avoided when feeling drowsy, excessively  sleepy, or  mentally fatigued.  6. An ENT consultation which may be useful for specific causes of and possible treatment of bothersome snoring .  7. Weight loss may be of benefit in reducing the severity of snoring.   Signature: Wilbert Bihari, MD; [facility]; Diplomat, American Board of Sleep  Medicine Electronically Signed: 09/09/2024 9:38:38 AM

## 2024-09-15 ENCOUNTER — Inpatient Hospital Stay: Attending: Hematology and Oncology

## 2024-09-15 ENCOUNTER — Inpatient Hospital Stay

## 2024-09-15 ENCOUNTER — Encounter: Payer: Self-pay | Admitting: Hematology and Oncology

## 2024-09-15 VITALS — BP 131/61 | HR 83 | Temp 97.8°F | Resp 16 | Ht 61.0 in | Wt 142.8 lb

## 2024-09-15 DIAGNOSIS — Z5112 Encounter for antineoplastic immunotherapy: Secondary | ICD-10-CM | POA: Insufficient documentation

## 2024-09-15 DIAGNOSIS — C9000 Multiple myeloma not having achieved remission: Secondary | ICD-10-CM | POA: Diagnosis present

## 2024-09-15 DIAGNOSIS — Z79899 Other long term (current) drug therapy: Secondary | ICD-10-CM | POA: Insufficient documentation

## 2024-09-15 LAB — CBC WITH DIFFERENTIAL (CANCER CENTER ONLY)
Abs Immature Granulocytes: 0.04 K/uL (ref 0.00–0.07)
Basophils Absolute: 0 K/uL (ref 0.0–0.1)
Basophils Relative: 0 %
Eosinophils Absolute: 0 K/uL (ref 0.0–0.5)
Eosinophils Relative: 0 %
HCT: 40 % (ref 36.0–46.0)
Hemoglobin: 13.4 g/dL (ref 12.0–15.0)
Immature Granulocytes: 1 %
Lymphocytes Relative: 10 %
Lymphs Abs: 0.7 K/uL (ref 0.7–4.0)
MCH: 32.7 pg (ref 26.0–34.0)
MCHC: 33.5 g/dL (ref 30.0–36.0)
MCV: 97.6 fL (ref 80.0–100.0)
Monocytes Absolute: 0 K/uL — ABNORMAL LOW (ref 0.1–1.0)
Monocytes Relative: 1 %
Neutro Abs: 6.2 K/uL (ref 1.7–7.7)
Neutrophils Relative %: 88 %
Platelet Count: 277 K/uL (ref 150–400)
RBC: 4.1 MIL/uL (ref 3.87–5.11)
RDW: 13 % (ref 11.5–15.5)
WBC Count: 6.9 K/uL (ref 4.0–10.5)
nRBC: 0 % (ref 0.0–0.2)

## 2024-09-15 LAB — CMP (CANCER CENTER ONLY)
ALT: 21 U/L (ref 0–44)
AST: 24 U/L (ref 15–41)
Albumin: 4.6 g/dL (ref 3.5–5.0)
Alkaline Phosphatase: 96 U/L (ref 38–126)
Anion gap: 12 (ref 5–15)
BUN: 18 mg/dL (ref 8–23)
CO2: 27 mmol/L (ref 22–32)
Calcium: 9 mg/dL (ref 8.9–10.3)
Chloride: 104 mmol/L (ref 98–111)
Creatinine: 0.69 mg/dL (ref 0.44–1.00)
GFR, Estimated: >60 mL/min (ref >60)
Glucose, Bld: 230 mg/dL — ABNORMAL HIGH (ref 70–99)
Potassium: 3.5 mmol/L (ref 3.5–5.1)
Sodium: 143 mmol/L (ref 135–145)
Total Bilirubin: 1.6 mg/dL — ABNORMAL HIGH (ref 0.0–1.2)
Total Protein: 6.3 g/dL — ABNORMAL LOW (ref 6.5–8.1)

## 2024-09-15 MED ORDER — DEXTROSE 5 % IV SOLN
70.0000 mg/m2 | Freq: Once | INTRAVENOUS | Status: AC
  Administered 2024-09-15: 120 mg via INTRAVENOUS
  Filled 2024-09-15: qty 60

## 2024-09-15 MED ORDER — SODIUM CHLORIDE 0.9 % IV SOLN: Freq: Once | INTRAVENOUS | Status: AC

## 2024-09-15 MED ORDER — SODIUM CHLORIDE 0.9 % IV SOLN: INTRAVENOUS | Status: DC

## 2024-09-15 NOTE — Progress Notes (Signed)
 Per Dr. Federico - okay to proceed with treatment with bilirubin of 1.6.   Patient reports taking home dexamethasone  prior to infusion appointment.

## 2024-09-15 NOTE — Patient Instructions (Signed)
 [facility] - A DEPT OF [facility]   Discharge Instructions: Thank you for choosing [facility] to provide your oncology and hematology care.   If you have a lab appointment with the Cancer Center, please go directly to the Cancer Center and check in at the registration area.   Wear comfortable clothing and clothing appropriate for easy access to any Portacath or PICC line.   We strive to give you quality time with your provider. You may need to reschedule your appointment if you arrive late (15 or more minutes).  Arriving late affects you and other patients whose appointments are after yours.  Also, if you miss three or more appointments without notifying the office, you may be dismissed from the clinic at the provider's discretion.      For prescription refill requests, have your pharmacy contact our office and allow 72 hours for refills to be completed.    Today you received the following chemotherapy and/or immunotherapy agents: Carfilzomib (Kyprolis)      To help prevent nausea and vomiting after your treatment, we encourage you to take your nausea medication as directed.  BELOW ARE SYMPTOMS THAT SHOULD BE REPORTED IMMEDIATELY: *FEVER GREATER THAN 100.4 F (38 C) OR HIGHER *CHILLS OR SWEATING *NAUSEA AND VOMITING THAT IS NOT CONTROLLED WITH YOUR NAUSEA MEDICATION *UNUSUAL SHORTNESS OF BREATH *UNUSUAL BRUISING OR BLEEDING *URINARY PROBLEMS (pain or burning when urinating, or frequent urination) *BOWEL PROBLEMS (unusual diarrhea, constipation, pain near the anus) TENDERNESS IN MOUTH AND THROAT WITH OR WITHOUT PRESENCE OF ULCERS (sore throat, sores in mouth, or a toothache) UNUSUAL RASH, SWELLING OR PAIN  UNUSUAL VAGINAL DISCHARGE OR ITCHING   Items with * indicate a potential emergency and should be followed up as soon as possible or go to the Emergency Department if any problems should occur.  Please show the CHEMOTHERAPY ALERT CARD or  IMMUNOTHERAPY ALERT CARD at check-in to the Emergency Department and triage nurse.  Should you have questions after your visit or need to cancel or reschedule your appointment, please contact [facility] - A DEPT [facility]  Dept: [phone]  and follow the prompts.  Office hours are 8:00 a.m. to 4:30 p.m. Monday - Friday. Please note that voicemails left after 4:00 p.m. may not be returned until the following business day.  We are closed weekends and major holidays. You have access to a nurse at all times for urgent questions. Please call the main number to the clinic Dept: [phone] and follow the prompts.   For any non-urgent questions, you may also contact your provider using MyChart. We now offer e-Visits for anyone 55 and older to request care online for non-urgent symptoms. For details visit [email or web address].   Also download the MyChart app! Go to the app store, search "MyChart", open the app, select [place], and log in with your MyChart username and password.

## 2024-09-16 LAB — KAPPA/LAMBDA LIGHT CHAINS
Kappa free light chain: 1.2 mg/L — ABNORMAL LOW (ref 3.3–19.4)
Kappa, lambda light chain ratio: 0.03 — ABNORMAL LOW (ref 0.26–1.65)
Lambda free light chains: 46.1 mg/L — ABNORMAL HIGH (ref 5.7–26.3)

## 2024-09-17 LAB — MULTIPLE MYELOMA PANEL, SERUM
Albumin SerPl Elph-Mcnc: 4.2 g/dL (ref 2.9–4.4)
Albumin/Glob SerPl: 2.3 — ABNORMAL HIGH (ref 0.7–1.7)
Alpha 1: 0.2 g/dL (ref 0.0–0.4)
Alpha2 Glob SerPl Elph-Mcnc: 0.6 g/dL (ref 0.4–1.0)
B-Globulin SerPl Elph-Mcnc: 0.9 g/dL (ref 0.7–1.3)
Gamma Glob SerPl Elph-Mcnc: 0.2 g/dL — ABNORMAL LOW (ref 0.4–1.8)
Globulin, Total: 1.9 g/dL — ABNORMAL LOW (ref 2.2–3.9)
IgA: <5 mg/dL — ABNORMAL LOW (ref 64–422)
IgG (Immunoglobin G), Serum: 144 mg/dL — ABNORMAL LOW (ref 586–1602)
IgM (Immunoglobulin M), Srm: <5 mg/dL — ABNORMAL LOW (ref 26–217)
Total Protein ELP: 6.1 g/dL (ref 6.0–8.5)

## 2024-09-21 NOTE — Progress Notes (Unsigned)
 Patient Care Team: Shayne Anes, MD as PCP - General (Internal Medicine)  Clinic Day:  09/22/2024  Referring physician: Shayne Anes, MD  ASSESSMENT & PLAN:   Assessment & Plan: Multiple myeloma [facility]) # Free Lambda Multiple Myeloma 11/10/2022: WBC 5.68, Hgb 11.7, MCV 97.4, Plt 146 04/17/2023: WBC 4.53, Hgb 11.5, MCV 107.6, Plt 110 05/16/2023: establish care with Dr. Federico. Labs showed M protein 0.1 with Lambda 900, Kappa 3 with ratio 0.00.   06/01/2023: bone marrow biopsy performed, showed plasma cell myeloma nearly effacing approximately 80% of the cellular marrow.  06/25/2023: Cycle 1 Day 1 of VRD chemotherapy 07/23/2023: Cycle 2 Day 1 of VRD chemotherapy 08/13/2023: Cycle 3 Day 1 of VRD chemotherapy 09/03/2023: Cycle 1 Day 1 of Dara/Rev/Dex. 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 12/17/2023: Cycle 1 Day 1 of Kyprolis . Dara/Rev ineffective.  07/14/2024 -cycle 8 day 15 of Kyprolis  - tolerating well. Continue as scheduled  09/22/2024 -cycle 11 day 8 Kyprolis  -tolerating well.  She has received dental clearance for Zometa infusions.  Would like to wait till after January 1 to begin.  Proceed with treatment as scheduled without dose adjustments.   Elevated blood pressure This generally happens for first 3 days after treatment with Kyprolis .  She has a recovery blood pressure medication which she takes.  This does cause headache and fatigue.  Resolves after blood pressure found without medication use.  Multiple myeloma  Patient is currently on cycle 11-day 8 of Kyprolis .  Tolerating well other than blood pressure elevation during treatment.  Has been recommended she have infusions of the Zometa for bone strength.  She has received dental clearance.  Letter from [facility]  Patient would like to wait till after the new year to add Zometa treatment.  Plan Labs reviewed. -Unremarkable CBC. -T. bili is 1.7.  CMP is otherwise unremarkable.  Consulted with Dr. Tina  who believes treatment is advised. Dexamethasone  20 mg to be given in infusion suite.  Patient forgot morning dose today.  Adjusted home dose of dexamethasone  to 20 mg since the dose patient is actually taking. Patient presentation are appropriate for treatment today. Proceed with cycle 11 day 8 Kyprolis .  No dose changes recommended today. Labs last flush, follow-up, and subsequent treatment with Kyprolis  as scheduled.  The patient understands the plans discussed today and is in agreement with them.  She knows to contact our office if she develops concerns prior to her next appointment.  I provided 25 minutes of face-to-face time during this encounter and > 50% was spent counseling as documented under my assessment and plan.    Powell FORBES Lessen, NP  [facility] - A DEPT OF [facility] [address] [place] [address] Dept: [phone] Dept Fax: [phone]   No orders of the defined types were placed in this encounter.     CHIEF COMPLAINT:  CC: Multiple myeloma  Current Treatment: Kyprolis   INTERVAL HISTORY:  Shanequia is here today for repeat clinical assessment.  Myeloma panel drawn on 09/16/2023.  Serum IgG decreased at 144.  Both IgA and IgM immunoglobulins are <5.  M protein is not observed.  Kappa free light chains are decreased at 1.2 with lambda free light chains elevated at 46.1.  Today, her CBC is unremarkable.  T. bili is slightly elevated at 1.7.  Her CMP is otherwise unremarkable.  She continues to have elevated blood pressure for the first  few days after treatment with couple of pillows.  Will recovery blood pressure medicine that she takes as elevation in her blood pressure causes headaches.  Of note, she states blood pressure medicine also causes headache with headaches.  She does recover after a few days.  Reports some shortness of breath intermittently.  This resolves after period of rest.  She states  she states she received dental clearance in November to have Zometa infusion infusions for bone strength.  She denies chest pain, chest pressure, or shortness of breath. She denies unusual headaches or visual disturbances. She denies abdominal pain, nausea, vomiting, or changes in bowel or bladder habits.    She denies fevers or chills. She denies pain. Her appetite is good. Her weight has been stable.  I have reviewed the past medical history, past surgical history, social history and family history with the patient and they are unchanged from previous note.  ALLERGIES:  is allergic to penicillins and sulfa antibiotics.  MEDICATIONS:  Current Outpatient Medications  Medication Sig Dispense Refill   acetaminophen  (TYLENOL ) 500 MG tablet Take 1,000 mg by mouth every 6 (six) hours as needed for mild pain (pain score 1-3) or headache.     acyclovir  (ZOVIRAX ) 400 MG tablet Take 400 mg by mouth in the morning.     ALPRAZolam (XANAX) 0.25 MG tablet Take 0.25 mg by mouth at bedtime as needed.     carfilzomib  (KYPROLIS ) 10 MG SOLR as directed Intravenous clarify dose. infusion is every monday 3 times a month with a break one week each month. long term med     Cholecalciferol 25 MCG (1000 UT) capsule Take 1,000 Units by mouth daily.     Cyanocobalamin  (VITAMIN B 12 PO) Take 1,000 mcg by mouth daily.     Elderberry-Vitamin C-Zinc (ELDERBERRY IMMUNE HEALTH GUMMY PO) Take by mouth daily.     fluticasone (FLONASE) 50 MCG/ACT nasal spray Place 2 sprays into both nostrils as needed for rhinitis or allergies.     furosemide (LASIX) 20 MG tablet Take 20 mg by mouth daily.     hydrALAZINE  (APRESOLINE ) 50 MG tablet Take 1 tablet (50 mg total) by mouth every 4 hours as needed for SBP>150. 30 tablet 5   HYDROcodone -acetaminophen  (NORCO/VICODIN) 5-325 MG tablet Take 1 tablet by mouth every 6 (six) hours as needed for moderate pain (pain score 4-6). 21 tablet 0   levocetirizine (XYZAL) 5 MG tablet Take 5 mg by mouth at  bedtime.     lidocaine -prilocaine  (EMLA ) cream Apply 1 Application topically as needed. 30 g 1   ondansetron  (ZOFRAN ) 8 MG tablet Take 1 tablet (8 mg total) by mouth every 8 (eight) hours as needed. 30 tablet 0   Polyethyl Glyc-Propyl Glyc PF (SYSTANE PRESERVATIVE FREE) 0.4-0.3 % SOLN Place 1-2 drops into both eyes daily as needed (dry eyes).     potassium chloride (KLOR-CON) 10 MEQ tablet Take 10 mEq by mouth daily.     prochlorperazine  (COMPAZINE ) 10 MG tablet Take 1 tablet (10 mg total) by mouth every 6 (six) hours as needed for nausea or vomiting. 30 tablet 0   rosuvastatin (CRESTOR) 5 MG tablet Take 5 mg by mouth daily.     telmisartan (MICARDIS) 80 MG tablet Take 1 tablet (80 mg total) by mouth daily.     triamcinolone  (NASACORT  ALLERGY 24HR) 55 MCG/ACT AERO nasal inhaler Place 2 sprays into the nose daily.     dexamethasone  (DECADRON ) 4 MG tablet Take 5 tablets (20 mg total) by mouth  once a week. Take once weekly on the day you receive the chemotherapy injection. 40 tablet 5   No current facility-administered medications for this visit.   Facility-Administered Medications Ordered in Other Visits  Medication Dose Route Frequency Provider Last Rate Last Admin   0.9 %  sodium chloride  infusion   Intravenous Continuous Federico Norleen ONEIDA MADISON, MD        HISTORY OF PRESENT ILLNESS:   Oncology History  Multiple myeloma (HCC)  06/11/2023 Initial Diagnosis   Multiple myeloma (HCC)   06/25/2023 - 08/20/2023 Chemotherapy   Patient is on Treatment Plan : MYELOMA  RVD SQ q21d x 6 cycles     09/03/2023 - 11/26/2023 Chemotherapy   Patient is on Treatment Plan : MYELOMA RELAPSED REFRACTORY Daratumumab  SQ + Lenalidomide  + Dexamethasone  (DaraRd) q28d     12/17/2023 -  Chemotherapy   Patient is on Treatment Plan : MYELOMA RELAPSED/REFRACTORY Carfilzomib  (20/70) D1,8,15 + Dexamethasone  weekly (40) (Kd) q28d  x 9 cycles / Dexamethasone  D1,8,15         REVIEW OF SYSTEMS:   Constitutional: Denies fevers,  chills or abnormal weight loss Eyes: Denies blurriness of vision Ears, nose, mouth, throat, and face: Denies mucositis or sore throat Respiratory: Denies cough, dyspnea or wheezes Cardiovascular: Denies palpitation, chest discomfort or lower extremity swelling Gastrointestinal:  Denies nausea, heartburn or change in bowel habits Skin: Denies abnormal skin rashes Lymphatics: Denies new lymphadenopathy or easy bruising Neurological:Denies numbness, tingling or new weaknesses Behavioral/Psych: Mood is stable, no new changes  All other systems were reviewed with the patient and are negative.   VITALS:   Today's Vitals   09/22/24 0917 09/22/24 0936  BP: (!) 124/59   Pulse: 79   Resp: 16   Temp: 97.9 F (36.6 C)   TempSrc: Temporal   SpO2: 98%   Weight: 144 lb 9.6 oz (65.6 kg)   PainSc:  0-No pain   Body mass index is 27.32 kg/m.   Wt Readings from Last 3 Encounters:  09/22/24 144 lb 9.6 oz (65.6 kg)  09/15/24 142 lb 12 oz (64.8 kg)  09/01/24 142 lb 3.2 oz (64.5 kg)    Body mass index is 27.32 kg/m.  Performance status (ECOG): 1 - Symptomatic but completely ambulatory  PHYSICAL EXAM:   GENERAL:alert, no distress and comfortable SKIN: skin color, texture, turgor are normal, no rashes or significant lesions EYES: normal, Conjunctiva are pink and non-injected, sclera clear OROPHARYNX:no exudate, no erythema and lips, buccal mucosa, and tongue normal  NECK: supple, thyroid  normal size, non-tender, without nodularity LYMPH:  no palpable lymphadenopathy in the cervical, axillary or inguinal LUNGS: clear to auscultation and percussion with normal breathing effort HEART: regular rate & rhythm and no murmurs and no lower extremity edema ABDOMEN:abdomen soft, non-tender and normal bowel sounds Musculoskeletal:no cyanosis of digits and no clubbing  NEURO: alert & oriented x 3 with fluent speech, no focal motor/sensory deficits  LABORATORY DATA:  I have reviewed the data as  listed    Component Value Date/Time   NA 143 09/22/2024 0854   K 3.8 09/22/2024 0854   CL 107 09/22/2024 0854   CO2 27 09/22/2024 0854   GLUCOSE 118 (H) 09/22/2024 0854   BUN 16 09/22/2024 0854   CREATININE 0.67 09/22/2024 0854   CREATININE 0.67 12/08/2014 0851   CALCIUM 8.9 09/22/2024 0854   PROT 5.6 (L) 09/22/2024 0854   ALBUMIN 4.2 09/22/2024 0854   AST 20 09/22/2024 0854   ALT 19 09/22/2024 0854   ALKPHOS  77 09/22/2024 0854   BILITOT 1.7 (H) 09/22/2024 0854   GFRNONAA >60 09/22/2024 0854   GFRAA 73 (L) 04/12/2014 1659    Lab Results  Component Value Date   WBC 4.9 09/22/2024   NEUTROABS 2.5 09/22/2024   HGB 12.5 09/22/2024   HCT 37.2 09/22/2024   MCV 98.7 09/22/2024   PLT 126 (L) 09/22/2024

## 2024-09-21 NOTE — Assessment & Plan Note (Signed)
#   Free Lambda Multiple Myeloma 11/10/2022: WBC 5.68, Hgb 11.7, MCV 97.4, Plt 146 04/17/2023: WBC 4.53, Hgb 11.5, MCV 107.6, Plt 110 05/16/2023: establish care with Dr. Federico. Labs showed M protein 0.1 with Lambda 900, Kappa 3 with ratio 0.00.   06/01/2023: bone marrow biopsy performed, showed plasma cell myeloma nearly effacing approximately 80% of the cellular marrow.  06/25/2023: Cycle 1 Day 1 of VRD chemotherapy 07/23/2023: Cycle 2 Day 1 of VRD chemotherapy 08/13/2023: Cycle 3 Day 1 of VRD chemotherapy 09/03/2023: Cycle 1 Day 1 of Dara/Rev/Dex. 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 12/17/2023: Cycle 1 Day 1 of Kyprolis . Dara/Rev ineffective.  07/14/2024 -cycle 8 day 15 of Kyprolis  - tolerating well. Continue as scheduled  09/22/2024 -cycle 11 day 8 Kyprolis  -tolerating well.  She has received dental clearance for Zometa infusions.  Would like to wait till after January 1 to begin.  Proceed with treatment as scheduled without dose adjustments.

## 2024-09-22 ENCOUNTER — Inpatient Hospital Stay

## 2024-09-22 ENCOUNTER — Encounter: Payer: Self-pay | Admitting: Hematology and Oncology

## 2024-09-22 ENCOUNTER — Encounter: Payer: Self-pay | Admitting: Nurse Practitioner

## 2024-09-22 ENCOUNTER — Inpatient Hospital Stay: Admitting: Nurse Practitioner

## 2024-09-22 ENCOUNTER — Telehealth: Payer: Self-pay | Admitting: *Deleted

## 2024-09-22 VITALS — BP 124/59 | HR 79 | Temp 97.9°F | Resp 16 | Wt 144.6 lb

## 2024-09-22 VITALS — BP 122/54 | HR 88 | Temp 97.6°F | Resp 18

## 2024-09-22 DIAGNOSIS — C9000 Multiple myeloma not having achieved remission: Secondary | ICD-10-CM

## 2024-09-22 DIAGNOSIS — Z5112 Encounter for antineoplastic immunotherapy: Secondary | ICD-10-CM | POA: Diagnosis not present

## 2024-09-22 LAB — CMP (CANCER CENTER ONLY)
ALT: 19 U/L (ref 0–44)
AST: 20 U/L (ref 15–41)
Albumin: 4.2 g/dL (ref 3.5–5.0)
Alkaline Phosphatase: 77 U/L (ref 38–126)
Anion gap: 9 (ref 5–15)
BUN: 16 mg/dL (ref 8–23)
CO2: 27 mmol/L (ref 22–32)
Calcium: 8.9 mg/dL (ref 8.9–10.3)
Chloride: 107 mmol/L (ref 98–111)
Creatinine: 0.67 mg/dL (ref 0.44–1.00)
GFR, Estimated: >60 mL/min (ref >60)
Glucose, Bld: 118 mg/dL — ABNORMAL HIGH (ref 70–99)
Potassium: 3.8 mmol/L (ref 3.5–5.1)
Sodium: 143 mmol/L (ref 135–145)
Total Bilirubin: 1.7 mg/dL — ABNORMAL HIGH (ref 0.0–1.2)
Total Protein: 5.6 g/dL — ABNORMAL LOW (ref 6.5–8.1)

## 2024-09-22 LAB — CBC WITH DIFFERENTIAL (CANCER CENTER ONLY)
Abs Immature Granulocytes: 0.02 K/uL (ref 0.00–0.07)
Basophils Absolute: 0 K/uL (ref 0.0–0.1)
Basophils Relative: 0 %
Eosinophils Absolute: 0.1 K/uL (ref 0.0–0.5)
Eosinophils Relative: 2 %
HCT: 37.2 % (ref 36.0–46.0)
Hemoglobin: 12.5 g/dL (ref 12.0–15.0)
Immature Granulocytes: 0 %
Lymphocytes Relative: 31 %
Lymphs Abs: 1.5 K/uL (ref 0.7–4.0)
MCH: 33.2 pg (ref 26.0–34.0)
MCHC: 33.6 g/dL (ref 30.0–36.0)
MCV: 98.7 fL (ref 80.0–100.0)
Monocytes Absolute: 0.7 K/uL (ref 0.1–1.0)
Monocytes Relative: 15 %
Neutro Abs: 2.5 K/uL (ref 1.7–7.7)
Neutrophils Relative %: 52 %
Platelet Count: 126 K/uL — ABNORMAL LOW (ref 150–400)
RBC: 3.77 MIL/uL — ABNORMAL LOW (ref 3.87–5.11)
RDW: 12.9 % (ref 11.5–15.5)
WBC Count: 4.9 K/uL (ref 4.0–10.5)
nRBC: 0 % (ref 0.0–0.2)

## 2024-09-22 MED ORDER — SODIUM CHLORIDE 0.9 % IV SOLN: INTRAVENOUS | Status: DC

## 2024-09-22 MED ORDER — DEXTROSE 5 % IV SOLN
70.0000 mg/m2 | Freq: Once | INTRAVENOUS | Status: AC
  Administered 2024-09-22: 120 mg via INTRAVENOUS
  Filled 2024-09-22: qty 60

## 2024-09-22 MED ORDER — SODIUM CHLORIDE 0.9 % IV SOLN: Freq: Once | INTRAVENOUS | Status: AC

## 2024-09-22 MED ORDER — DEXAMETHASONE 4 MG PO TABS: 20.0000 mg | ORAL_TABLET | ORAL | 5 refills | Status: AC

## 2024-09-22 MED ORDER — DEXAMETHASONE 6 MG PO TABS
20.0000 mg | ORAL_TABLET | Freq: Once | ORAL | Status: AC
  Administered 2024-09-22: 20 mg via ORAL
  Filled 2024-09-22: qty 2

## 2024-09-22 NOTE — Progress Notes (Signed)
 Okay to treat with Kyprolis  today with tbili 1.7 per Dr. Tina.  Harlene Nasuti, PharmD Oncology Infusion Pharmacist 09/22/2024 11:52 AM

## 2024-09-22 NOTE — Telephone Encounter (Signed)
-----   Message from Wilbert Bihari sent at 09/09/2024  9:40 AM EST ----- Please let patient know that sleep study showed no significant sleep apnea.

## 2024-09-22 NOTE — Telephone Encounter (Signed)
 The patient has been notified of the result via her mychart.

## 2024-09-24 ENCOUNTER — Telehealth: Payer: Self-pay

## 2024-09-24 DIAGNOSIS — Z7189 Other specified counseling: Secondary | ICD-10-CM

## 2024-09-24 NOTE — Progress Notes (Signed)
 Complex Care Management Note  Care Guide Note 09/24/2024 Name: TAUNYA GORAL MRN: [id number] DOB: 10-Aug-1951  MONTIE GELARDI is a 73 y.o. year old female who sees Shayne Anes, MD for primary care. I reached out to Rock [name] by phone today to offer complex care management services.  Ms. Gagliardo was given information about Complex Care Management services today including:   The Complex Care Management services include support from the care team which includes your Nurse Care Manager, Clinical Social Worker, or Pharmacist.  The Complex Care Management team is here to help remove barriers to the health concerns and goals most important to you. Complex Care Management services are voluntary, and the patient may decline or stop services at any time by request to their care team member.   Complex Care Management Consent Status: Patient did not agree to participate in complex care management services at this time.  Follow up plan:    Encounter Outcome:  Patient Refused  Jeoffrey Buffalo , RMA     [facility]  [facility] Guide  Direct Dial: [phone]  Website: [email or web address]

## 2024-09-25 DIAGNOSIS — D692 Other nonthrombocytopenic purpura: Secondary | ICD-10-CM | POA: Diagnosis not present

## 2024-09-25 DIAGNOSIS — L820 Inflamed seborrheic keratosis: Secondary | ICD-10-CM | POA: Diagnosis not present

## 2024-09-25 DIAGNOSIS — L821 Other seborrheic keratosis: Secondary | ICD-10-CM | POA: Diagnosis not present

## 2024-09-25 DIAGNOSIS — L570 Actinic keratosis: Secondary | ICD-10-CM | POA: Diagnosis not present

## 2024-09-25 DIAGNOSIS — L814 Other melanin hyperpigmentation: Secondary | ICD-10-CM | POA: Diagnosis not present

## 2024-09-25 DIAGNOSIS — D1801 Hemangioma of skin and subcutaneous tissue: Secondary | ICD-10-CM | POA: Diagnosis not present

## 2024-09-29 ENCOUNTER — Inpatient Hospital Stay

## 2024-09-29 VITALS — BP 122/65 | HR 79 | Temp 98.5°F | Resp 18 | Wt 141.4 lb

## 2024-09-29 DIAGNOSIS — C9000 Multiple myeloma not having achieved remission: Secondary | ICD-10-CM

## 2024-09-29 DIAGNOSIS — Z5112 Encounter for antineoplastic immunotherapy: Secondary | ICD-10-CM | POA: Diagnosis not present

## 2024-09-29 LAB — CMP (CANCER CENTER ONLY)
ALT: 22 U/L (ref 0–44)
AST: 21 U/L (ref 15–41)
Albumin: 4.5 g/dL (ref 3.5–5.0)
Alkaline Phosphatase: 83 U/L (ref 38–126)
Anion gap: 11 (ref 5–15)
BUN: 19 mg/dL (ref 8–23)
CO2: 25 mmol/L (ref 22–32)
Calcium: 8.9 mg/dL (ref 8.9–10.3)
Chloride: 104 mmol/L (ref 98–111)
Creatinine: 0.71 mg/dL (ref 0.44–1.00)
GFR, Estimated: >60 mL/min (ref >60)
Glucose, Bld: 177 mg/dL — ABNORMAL HIGH (ref 70–99)
Potassium: 4 mmol/L (ref 3.5–5.1)
Sodium: 141 mmol/L (ref 135–145)
Total Bilirubin: 1.4 mg/dL — ABNORMAL HIGH (ref 0.0–1.2)
Total Protein: 6.1 g/dL — ABNORMAL LOW (ref 6.5–8.1)

## 2024-09-29 LAB — CBC WITH DIFFERENTIAL (CANCER CENTER ONLY)
Abs Immature Granulocytes: 0.05 K/uL (ref 0.00–0.07)
Basophils Absolute: 0 K/uL (ref 0.0–0.1)
Basophils Relative: 0 %
Eosinophils Absolute: 0 K/uL (ref 0.0–0.5)
Eosinophils Relative: 0 %
HCT: 38.2 % (ref 36.0–46.0)
Hemoglobin: 12.9 g/dL (ref 12.0–15.0)
Immature Granulocytes: 1 %
Lymphocytes Relative: 6 %
Lymphs Abs: 0.6 K/uL — ABNORMAL LOW (ref 0.7–4.0)
MCH: 33 pg (ref 26.0–34.0)
MCHC: 33.8 g/dL (ref 30.0–36.0)
MCV: 97.7 fL (ref 80.0–100.0)
Monocytes Absolute: 0.1 K/uL (ref 0.1–1.0)
Monocytes Relative: 1 %
Neutro Abs: 9.8 K/uL — ABNORMAL HIGH (ref 1.7–7.7)
Neutrophils Relative %: 92 %
Platelet Count: 181 K/uL (ref 150–400)
RBC: 3.91 MIL/uL (ref 3.87–5.11)
RDW: 13.1 % (ref 11.5–15.5)
WBC Count: 10.6 K/uL — ABNORMAL HIGH (ref 4.0–10.5)
nRBC: 0 % (ref 0.0–0.2)

## 2024-09-29 MED ORDER — SODIUM CHLORIDE 0.9 % IV SOLN: Freq: Once | INTRAVENOUS | Status: AC

## 2024-09-29 MED ORDER — DEXTROSE 5 % IV SOLN
70.0000 mg/m2 | Freq: Once | INTRAVENOUS | Status: AC
  Administered 2024-09-29: 15:00:00 120 mg via INTRAVENOUS
  Filled 2024-09-29: qty 60

## 2024-09-29 MED ORDER — SODIUM CHLORIDE 0.9% FLUSH
10.0000 mL | INTRAVENOUS | Status: DC | PRN
  Administered 2024-09-29: 16:00:00 10 mL

## 2024-09-29 NOTE — Progress Notes (Signed)
 Confirmed w/ Dr. Federico, [phone] was held for holiday.  Ok to proceed today w/ [id number].  Brooke Whitaker, Pharm.D., CPP 09/29/2024@2 :55 PM

## 2024-10-13 ENCOUNTER — Inpatient Hospital Stay

## 2024-10-13 VITALS — BP 134/59 | HR 83 | Temp 98.1°F | Resp 16 | Wt 143.5 lb

## 2024-10-13 DIAGNOSIS — C9000 Multiple myeloma not having achieved remission: Secondary | ICD-10-CM

## 2024-10-13 DIAGNOSIS — Z5112 Encounter for antineoplastic immunotherapy: Secondary | ICD-10-CM | POA: Diagnosis not present

## 2024-10-13 LAB — CBC WITH DIFFERENTIAL (CANCER CENTER ONLY)
Abs Immature Granulocytes: 0.09 K/uL — ABNORMAL HIGH (ref 0.00–0.07)
Basophils Absolute: 0 K/uL (ref 0.0–0.1)
Basophils Relative: 0 %
Eosinophils Absolute: 0 K/uL (ref 0.0–0.5)
Eosinophils Relative: 0 %
HCT: 39.2 % (ref 36.0–46.0)
Hemoglobin: 13.4 g/dL (ref 12.0–15.0)
Immature Granulocytes: 1 %
Lymphocytes Relative: 7 %
Lymphs Abs: 0.8 K/uL (ref 0.7–4.0)
MCH: 33.1 pg (ref 26.0–34.0)
MCHC: 34.2 g/dL (ref 30.0–36.0)
MCV: 96.8 fL (ref 80.0–100.0)
Monocytes Absolute: 0.1 K/uL (ref 0.1–1.0)
Monocytes Relative: 1 %
Neutro Abs: 10.3 K/uL — ABNORMAL HIGH (ref 1.7–7.7)
Neutrophils Relative %: 91 %
Platelet Count: 259 K/uL (ref 150–400)
RBC: 4.05 MIL/uL (ref 3.87–5.11)
RDW: 12.4 % (ref 11.5–15.5)
WBC Count: 11.2 K/uL — ABNORMAL HIGH (ref 4.0–10.5)
nRBC: 0 % (ref 0.0–0.2)

## 2024-10-13 LAB — CMP (CANCER CENTER ONLY)
ALT: 23 U/L (ref 0–44)
AST: 25 U/L (ref 15–41)
Albumin: 4.7 g/dL (ref 3.5–5.0)
Alkaline Phosphatase: 91 U/L (ref 38–126)
Anion gap: 13 (ref 5–15)
BUN: 20 mg/dL (ref 8–23)
CO2: 26 mmol/L (ref 22–32)
Calcium: 9.3 mg/dL (ref 8.9–10.3)
Chloride: 103 mmol/L (ref 98–111)
Creatinine: 0.65 mg/dL (ref 0.44–1.00)
GFR, Estimated: >60 mL/min
Glucose, Bld: 185 mg/dL — ABNORMAL HIGH (ref 70–99)
Potassium: 3.7 mmol/L (ref 3.5–5.1)
Sodium: 142 mmol/L (ref 135–145)
Total Bilirubin: 1.3 mg/dL — ABNORMAL HIGH (ref 0.0–1.2)
Total Protein: 6.4 g/dL — ABNORMAL LOW (ref 6.5–8.1)

## 2024-10-13 MED ORDER — DEXTROSE 5 % IV SOLN
70.0000 mg/m2 | Freq: Once | INTRAVENOUS | Status: AC
  Administered 2024-10-13: 120 mg via INTRAVENOUS
  Filled 2024-10-13: qty 60

## 2024-10-13 MED ORDER — SODIUM CHLORIDE 0.9 % IV SOLN: INTRAVENOUS | Status: DC

## 2024-10-13 MED ORDER — SODIUM CHLORIDE 0.9 % IV SOLN: Freq: Once | INTRAVENOUS | Status: AC

## 2024-10-13 MED ORDER — SODIUM CHLORIDE 0.9% FLUSH
10.0000 mL | INTRAVENOUS | Status: DC | PRN
  Administered 2024-10-13: 10 mL

## 2024-10-14 LAB — KAPPA/LAMBDA LIGHT CHAINS
Kappa free light chain: 1.5 mg/L — ABNORMAL LOW (ref 3.3–19.4)
Kappa, lambda light chain ratio: 0.03 — ABNORMAL LOW (ref 0.26–1.65)
Lambda free light chains: 43 mg/L — ABNORMAL HIGH (ref 5.7–26.3)

## 2024-10-15 LAB — MULTIPLE MYELOMA PANEL, SERUM
Albumin SerPl Elph-Mcnc: 3.9 g/dL (ref 2.9–4.4)
Albumin/Glob SerPl: 2 — ABNORMAL HIGH (ref 0.7–1.7)
Alpha 1: 0.3 g/dL (ref 0.0–0.4)
Alpha2 Glob SerPl Elph-Mcnc: 0.7 g/dL (ref 0.4–1.0)
B-Globulin SerPl Elph-Mcnc: 0.9 g/dL (ref 0.7–1.3)
Gamma Glob SerPl Elph-Mcnc: 0.2 g/dL — ABNORMAL LOW (ref 0.4–1.8)
Globulin, Total: 2 g/dL — ABNORMAL LOW (ref 2.2–3.9)
IgA: <5 mg/dL — ABNORMAL LOW (ref 64–422)
IgG (Immunoglobin G), Serum: 137 mg/dL — ABNORMAL LOW (ref 586–1602)
IgM (Immunoglobulin M), Srm: <5 mg/dL — ABNORMAL LOW (ref 26–217)
Total Protein ELP: 5.9 g/dL — ABNORMAL LOW (ref 6.0–8.5)

## 2024-10-19 NOTE — Progress Notes (Signed)
 " Patient Care Team: Shayne Anes, MD as PCP - General (Internal Medicine)  Clinic Day:  10/20/2024  Referring physician: Shayne Anes, MD  ASSESSMENT & PLAN:   Assessment & Plan: Multiple myeloma [facility]) # Free Lambda Multiple Myeloma 11/10/2022: WBC 5.68, Hgb 11.7, MCV 97.4, Plt 146 04/17/2023: WBC 4.53, Hgb 11.5, MCV 107.6, Plt 110 05/16/2023: establish care with Dr. Federico. Labs showed M protein 0.1 with Lambda 900, Kappa 3 with ratio 0.00.   06/01/2023: bone marrow biopsy performed, showed plasma cell myeloma nearly effacing approximately 80% of the cellular marrow.  06/25/2023: Cycle 1 Day 1 of VRD chemotherapy 07/23/2023: Cycle 2 Day 1 of VRD chemotherapy 08/13/2023: Cycle 3 Day 1 of VRD chemotherapy 09/03/2023: Cycle 1 Day 1 of Dara/Rev/Dex. 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 12/17/2023: Cycle 1 Day 1 of Kyprolis . Dara/Rev ineffective.  07/14/2024 -cycle 8 day 15 of Kyprolis  - tolerating well. Continue as scheduled  09/22/2024 -cycle 11 day 8 Kyprolis  -tolerating well.  She has received dental clearance for Zometa infusions.  Would like to wait till after January 1 to begin.  Proceed with treatment as scheduled without dose adjustments. 10/20/2024 -cycle 12 day 8 Kyprolis .  Tolerating well other than high blood pressure following treatment.  BP medications managed by her primary care.  Will proceed with treatment as scheduled without dose adjustments.   Elevated blood pressure Patient reports blood pressure getting higher, faster, after treatment with Kyprolis .  Primary care recently changed BP regimen along with recovery BP meds.  She is currently on telmisartan daily and hydralazine  when needed for SBP >150.  Reports that hydralazine  causes her to have sinus pressure and pain.  Manages with Tylenol .  Plan Labs reviewed. - Platelet count 132.  ANC 8.0.  CBC, lites unremarkable. - T. bili 1.4.  CMP is otherwise unremarkable. Patient labs and presentation are appropriate for  treatment today. -Proceed with Kyprolis  today. Lab/flush, follow-up, and subsequent treatments as scheduled.  The patient understands the plans discussed today and is in agreement with them.  She knows to contact our office if she develops concerns prior to her next appointment.  I provided 25 minutes of face-to-face time during this encounter and > 50% was spent counseling as documented under my assessment and plan.    Powell FORBES Lessen, NP  [facility] - A DEPT OF [facility] [address] [place] [address] Dept: [phone] Dept Fax: [phone]   No orders of the defined types were placed in this encounter.     CHIEF COMPLAINT:  CC: Multiple myeloma  Current Treatment: Kyprolis   INTERVAL HISTORY:  Brooke Whitaker is here today for repeat clinical assessment.  She last saw me on 09/22/2024.  Blood pressure continues to get high following treatment.  She states is getting higher and happening more quickly than it used to.  Primary care has changed her blood pressure regimen along with recovery medications.  She reports her hydralazine  does cause sinus pressure and pain.  Managing with Tylenol .  She denies chest pain, chest pressure, or shortness of breath. She denies headaches or visual disturbances. She denies abdominal pain, nausea, vomiting, or changes in bowel or bladder habits.  She denies fevers or chills. She denies pain. Her appetite is good. Her weight has been stablec.  I have reviewed the past medical history, past surgical history, social history and family history with the patient and they are unchanged from previous note.  ALLERGIES:  is allergic to penicillins and sulfa antibiotics.  MEDICATIONS:  Current Outpatient Medications  Medication Sig Dispense Refill   acetaminophen  (TYLENOL ) 500 MG tablet Take 1,000 mg by mouth every 6 (six) hours as needed for mild pain (pain score 1-3) or headache.      acyclovir  (ZOVIRAX ) 400 MG tablet Take 400 mg by mouth in the morning.     ALPRAZolam (XANAX) 0.25 MG tablet Take 0.25 mg by mouth at bedtime as needed.     carfilzomib  (KYPROLIS ) 10 MG SOLR as directed Intravenous clarify dose. infusion is every monday 3 times a month with a break one week each month. long term med     Cholecalciferol 25 MCG (1000 UT) capsule Take 1,000 Units by mouth daily.     Cyanocobalamin  (VITAMIN B 12 PO) Take 1,000 mcg by mouth daily.     dexamethasone  (DECADRON ) 4 MG tablet Take 5 tablets (20 mg total) by mouth once a week. Take once weekly on the day you receive the chemotherapy injection. 40 tablet 5   Elderberry-Vitamin C-Zinc (ELDERBERRY IMMUNE HEALTH GUMMY PO) Take by mouth daily.     fluticasone (FLONASE) 50 MCG/ACT nasal spray Place 2 sprays into both nostrils as needed for rhinitis or allergies.     furosemide (LASIX) 20 MG tablet Take 20 mg by mouth daily.     hydrALAZINE  (APRESOLINE ) 50 MG tablet Take 1 tablet (50 mg total) by mouth every 4 hours as needed for SBP>150. 30 tablet 5   HYDROcodone -acetaminophen  (NORCO/VICODIN) 5-325 MG tablet Take 1 tablet by mouth every 6 (six) hours as needed for moderate pain (pain score 4-6). 21 tablet 0   levocetirizine (XYZAL) 5 MG tablet Take 5 mg by mouth at bedtime.     lidocaine -prilocaine  (EMLA ) cream Apply 1 Application topically as needed. 30 g 1   ondansetron  (ZOFRAN ) 8 MG tablet Take 1 tablet (8 mg total) by mouth every 8 (eight) hours as needed. 30 tablet 0   Polyethyl Glyc-Propyl Glyc PF (SYSTANE PRESERVATIVE FREE) 0.4-0.3 % SOLN Place 1-2 drops into both eyes daily as needed (dry eyes).     potassium chloride (KLOR-CON) 10 MEQ tablet Take 10 mEq by mouth daily.     prochlorperazine  (COMPAZINE ) 10 MG tablet Take 1 tablet (10 mg total) by mouth every 6 (six) hours as needed for nausea or vomiting. 30 tablet 0   rosuvastatin (CRESTOR) 5 MG tablet Take 5 mg by mouth daily.     telmisartan (MICARDIS) 80 MG tablet Take 1  tablet (80 mg total) by mouth daily.     triamcinolone  (NASACORT  ALLERGY 24HR) 55 MCG/ACT AERO nasal inhaler Place 2 sprays into the nose daily.     No current facility-administered medications for this visit.    HISTORY OF PRESENT ILLNESS:   Oncology History  Multiple myeloma (HCC)  06/11/2023 Initial Diagnosis   Multiple myeloma (HCC)   06/25/2023 - 08/20/2023 Chemotherapy   Patient is on Treatment Plan : MYELOMA  RVD SQ q21d x 6 cycles     09/03/2023 - 11/26/2023 Chemotherapy   Patient is on Treatment Plan : MYELOMA RELAPSED REFRACTORY Daratumumab  SQ + Lenalidomide  + Dexamethasone  (DaraRd) q28d     12/17/2023 -  Chemotherapy   Patient is on Treatment Plan : MYELOMA RELAPSED/REFRACTORY Carfilzomib  (20/70) D1,8,15 + Dexamethasone  weekly (40) (Kd) q28d  x 9 cycles / Dexamethasone  D1,8,15         REVIEW OF SYSTEMS:   Constitutional: Denies fevers, chills or abnormal weight loss Eyes: Denies blurriness of vision  Ears, nose, mouth, throat, and face: Denies mucositis or sore throat Respiratory: Denies cough, dyspnea or wheezes Cardiovascular: Denies palpitation, chest discomfort or lower extremity swelling Gastrointestinal:  Denies nausea, heartburn or change in bowel habits Skin: Denies abnormal skin rashes Lymphatics: Denies new lymphadenopathy or easy bruising Neurological:Denies numbness, tingling or new weaknesses Behavioral/Psych: Mood is stable, no new changes  All other systems were reviewed with the patient and are negative.   VITALS:   Today's Vitals   10/20/24 1259 10/20/24 1304  BP: 122/68   Pulse: 85   Resp: 17   Temp: 97.6 F (36.4 C)   SpO2: 97%   Weight: 143 lb 6.4 oz (65 kg)   PainSc:  0-No pain   Body mass index is 27.1 kg/m.   Wt Readings from Last 3 Encounters:  10/27/24 142 lb (64.4 kg)  10/20/24 143 lb 6.4 oz (65 kg)  10/13/24 143 lb 8 oz (65.1 kg)    Body mass index is 27.1 kg/m.  Performance status (ECOG): 2 - Symptomatic, <50% confined to  bed  PHYSICAL EXAM:   GENERAL:alert, no distress and comfortable SKIN: skin color, texture, turgor are normal, no rashes or significant lesions EYES: normal, Conjunctiva are pink and non-injected, sclera clear OROPHARYNX:no exudate, no erythema and lips, buccal mucosa, and tongue normal  NECK: supple, thyroid  normal size, non-tender, without nodularity LYMPH:  no palpable lymphadenopathy in the cervical, axillary or inguinal LUNGS: clear to auscultation and percussion with normal breathing effort HEART: regular rate & rhythm and no murmurs and no lower extremity edema ABDOMEN:abdomen soft, non-tender and normal bowel sounds Musculoskeletal:no cyanosis of digits and no clubbing  NEURO: alert & oriented x 3 with fluent speech, no focal motor/sensory deficits  LABORATORY DATA:  I have reviewed the data as listed    Component Value Date/Time   NA 141 10/27/2024 1325   K 4.3 10/27/2024 1325   CL 102 10/27/2024 1325   CO2 27 10/27/2024 1325   GLUCOSE 150 (H) 10/27/2024 1325   BUN 18 10/27/2024 1325   CREATININE 0.84 10/27/2024 1325   CREATININE 0.67 12/08/2014 0851   CALCIUM 9.1 10/27/2024 1325   PROT 6.3 (L) 10/27/2024 1325   ALBUMIN 4.4 10/27/2024 1325   AST 23 10/27/2024 1325   ALT 22 10/27/2024 1325   ALKPHOS 78 10/27/2024 1325   BILITOT 1.3 (H) 10/27/2024 1325   GFRNONAA >60 10/27/2024 1325   GFRAA 73 (L) 04/12/2014 1659    Lab Results  Component Value Date   WBC 7.4 10/27/2024   NEUTROABS 6.6 10/27/2024   HGB 13.3 10/27/2024   HCT 40.1 10/27/2024   MCV 97.8 10/27/2024   PLT 178 10/27/2024     "

## 2024-10-19 NOTE — Assessment & Plan Note (Addendum)
#   Free Lambda Multiple Myeloma 11/10/2022: WBC 5.68, Hgb 11.7, MCV 97.4, Plt 146 04/17/2023: WBC 4.53, Hgb 11.5, MCV 107.6, Plt 110 05/16/2023: establish care with Dr. Federico. Labs showed M protein 0.1 with Lambda 900, Kappa 3 with ratio 0.00.   06/01/2023: bone marrow biopsy performed, showed plasma cell myeloma nearly effacing approximately 80% of the cellular marrow.  06/25/2023: Cycle 1 Day 1 of VRD chemotherapy 07/23/2023: Cycle 2 Day 1 of VRD chemotherapy 08/13/2023: Cycle 3 Day 1 of VRD chemotherapy 09/03/2023: Cycle 1 Day 1 of Dara/Rev/Dex. 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 12/17/2023: Cycle 1 Day 1 of Kyprolis . Dara/Rev ineffective.  07/14/2024 -cycle 8 day 15 of Kyprolis  - tolerating well. Continue as scheduled  09/22/2024 -cycle 11 day 8 Kyprolis  -tolerating well.  She has received dental clearance for Zometa infusions.  Would like to wait till after January 1 to begin.  Proceed with treatment as scheduled without dose adjustments. 10/20/2024 -cycle 12 day 8 Kyprolis .  Tolerating well other than high blood pressure following treatment.  BP medications managed by her primary care.  Will proceed with treatment as scheduled without dose adjustments.

## 2024-10-20 ENCOUNTER — Encounter: Payer: Self-pay | Admitting: Hematology and Oncology

## 2024-10-20 ENCOUNTER — Inpatient Hospital Stay: Attending: Hematology and Oncology

## 2024-10-20 ENCOUNTER — Inpatient Hospital Stay (HOSPITAL_BASED_OUTPATIENT_CLINIC_OR_DEPARTMENT_OTHER): Admitting: Nurse Practitioner

## 2024-10-20 ENCOUNTER — Inpatient Hospital Stay

## 2024-10-20 VITALS — BP 122/68 | HR 85 | Temp 97.6°F | Resp 17 | Wt 143.4 lb

## 2024-10-20 DIAGNOSIS — Z79899 Other long term (current) drug therapy: Secondary | ICD-10-CM | POA: Insufficient documentation

## 2024-10-20 DIAGNOSIS — C9000 Multiple myeloma not having achieved remission: Secondary | ICD-10-CM | POA: Diagnosis not present

## 2024-10-20 DIAGNOSIS — Z5112 Encounter for antineoplastic immunotherapy: Secondary | ICD-10-CM | POA: Diagnosis present

## 2024-10-20 LAB — CMP (CANCER CENTER ONLY)
ALT: 18 U/L (ref 0–44)
AST: 20 U/L (ref 15–41)
Albumin: 4.6 g/dL (ref 3.5–5.0)
Alkaline Phosphatase: 76 U/L (ref 38–126)
Anion gap: 11 (ref 5–15)
BUN: 22 mg/dL (ref 8–23)
CO2: 27 mmol/L (ref 22–32)
Calcium: 9.3 mg/dL (ref 8.9–10.3)
Chloride: 106 mmol/L (ref 98–111)
Creatinine: 0.71 mg/dL (ref 0.44–1.00)
GFR, Estimated: >60 mL/min
Glucose, Bld: 116 mg/dL — ABNORMAL HIGH (ref 70–99)
Potassium: 4.1 mmol/L (ref 3.5–5.1)
Sodium: 143 mmol/L (ref 135–145)
Total Bilirubin: 1.4 mg/dL — ABNORMAL HIGH (ref 0.0–1.2)
Total Protein: 6.2 g/dL — ABNORMAL LOW (ref 6.5–8.1)

## 2024-10-20 LAB — CBC WITH DIFFERENTIAL (CANCER CENTER ONLY)
Abs Immature Granulocytes: 0.03 K/uL (ref 0.00–0.07)
Basophils Absolute: 0 K/uL (ref 0.0–0.1)
Basophils Relative: 0 %
Eosinophils Absolute: 0 K/uL (ref 0.0–0.5)
Eosinophils Relative: 0 %
HCT: 40 % (ref 36.0–46.0)
Hemoglobin: 13.2 g/dL (ref 12.0–15.0)
Immature Granulocytes: 0 %
Lymphocytes Relative: 9 %
Lymphs Abs: 0.8 K/uL (ref 0.7–4.0)
MCH: 32.4 pg (ref 26.0–34.0)
MCHC: 33 g/dL (ref 30.0–36.0)
MCV: 98.3 fL (ref 80.0–100.0)
Monocytes Absolute: 0.1 K/uL (ref 0.1–1.0)
Monocytes Relative: 2 %
Neutro Abs: 8 K/uL — ABNORMAL HIGH (ref 1.7–7.7)
Neutrophils Relative %: 89 %
Platelet Count: 132 K/uL — ABNORMAL LOW (ref 150–400)
RBC: 4.07 MIL/uL (ref 3.87–5.11)
RDW: 12.6 % (ref 11.5–15.5)
WBC Count: 8.9 K/uL (ref 4.0–10.5)
nRBC: 0 % (ref 0.0–0.2)

## 2024-10-20 MED ORDER — DEXTROSE 5 % IV SOLN
70.0000 mg/m2 | Freq: Once | INTRAVENOUS | Status: AC
  Administered 2024-10-20: 120 mg via INTRAVENOUS
  Filled 2024-10-20: qty 60

## 2024-10-20 MED ORDER — SODIUM CHLORIDE 0.9 % IV SOLN: INTRAVENOUS | Status: DC

## 2024-10-20 MED ORDER — SODIUM CHLORIDE 0.9 % IV SOLN: Freq: Once | INTRAVENOUS | Status: AC

## 2024-10-20 NOTE — Progress Notes (Signed)
 Pt stated she took steroid at approx 0930.

## 2024-10-20 NOTE — Patient Instructions (Signed)
 [facility] - A DEPT OF [facility]   Discharge Instructions: Thank you for choosing [facility] to provide your oncology and hematology care.   If you have a lab appointment with the Cancer Center, please go directly to the Cancer Center and check in at the registration area.   Wear comfortable clothing and clothing appropriate for easy access to any Portacath or PICC line.   We strive to give you quality time with your provider. You may need to reschedule your appointment if you arrive late (15 or more minutes).  Arriving late affects you and other patients whose appointments are after yours.  Also, if you miss three or more appointments without notifying the office, you may be dismissed from the clinic at the provider's discretion.      For prescription refill requests, have your pharmacy contact our office and allow 72 hours for refills to be completed.    Today you received the following chemotherapy and/or immunotherapy agents: Carfilzomib (Kyprolis)      To help prevent nausea and vomiting after your treatment, we encourage you to take your nausea medication as directed.  BELOW ARE SYMPTOMS THAT SHOULD BE REPORTED IMMEDIATELY: *FEVER GREATER THAN 100.4 F (38 C) OR HIGHER *CHILLS OR SWEATING *NAUSEA AND VOMITING THAT IS NOT CONTROLLED WITH YOUR NAUSEA MEDICATION *UNUSUAL SHORTNESS OF BREATH *UNUSUAL BRUISING OR BLEEDING *URINARY PROBLEMS (pain or burning when urinating, or frequent urination) *BOWEL PROBLEMS (unusual diarrhea, constipation, pain near the anus) TENDERNESS IN MOUTH AND THROAT WITH OR WITHOUT PRESENCE OF ULCERS (sore throat, sores in mouth, or a toothache) UNUSUAL RASH, SWELLING OR PAIN  UNUSUAL VAGINAL DISCHARGE OR ITCHING   Items with * indicate a potential emergency and should be followed up as soon as possible or go to the Emergency Department if any problems should occur.  Please show the CHEMOTHERAPY ALERT CARD or  IMMUNOTHERAPY ALERT CARD at check-in to the Emergency Department and triage nurse.  Should you have questions after your visit or need to cancel or reschedule your appointment, please contact [facility] - A DEPT [facility]  Dept: [phone]  and follow the prompts.  Office hours are 8:00 a.m. to 4:30 p.m. Monday - Friday. Please note that voicemails left after 4:00 p.m. may not be returned until the following business day.  We are closed weekends and major holidays. You have access to a nurse at all times for urgent questions. Please call the main number to the clinic Dept: [phone] and follow the prompts.   For any non-urgent questions, you may also contact your provider using MyChart. We now offer e-Visits for anyone 55 and older to request care online for non-urgent symptoms. For details visit [email or web address].   Also download the MyChart app! Go to the app store, search "MyChart", open the app, select [place], and log in with your MyChart username and password.

## 2024-10-21 ENCOUNTER — Encounter (HOSPITAL_BASED_OUTPATIENT_CLINIC_OR_DEPARTMENT_OTHER): Payer: Self-pay | Admitting: Pharmacist Clinician (PhC)/ Clinical Pharmacy Specialist

## 2024-10-22 NOTE — Progress Notes (Signed)
 Brooke Whitaker                                          MRN: [id number]   10/22/2024   The [facility] Quality Team Specialist reviewed this patient medical record for the purposes of chart review for care gap closure. The following were reviewed: abstraction for care gap closure-controlling blood pressure.    [facility] Quality Team

## 2024-10-23 ENCOUNTER — Encounter: Payer: Self-pay | Admitting: Hematology and Oncology

## 2024-10-24 ENCOUNTER — Other Ambulatory Visit: Payer: Self-pay

## 2024-10-27 ENCOUNTER — Other Ambulatory Visit: Payer: Self-pay | Admitting: Hematology and Oncology

## 2024-10-27 ENCOUNTER — Inpatient Hospital Stay

## 2024-10-27 VITALS — BP 132/57 | HR 85 | Temp 98.0°F | Resp 16 | Wt 142.0 lb

## 2024-10-27 DIAGNOSIS — Z5112 Encounter for antineoplastic immunotherapy: Secondary | ICD-10-CM | POA: Diagnosis not present

## 2024-10-27 DIAGNOSIS — C9000 Multiple myeloma not having achieved remission: Secondary | ICD-10-CM

## 2024-10-27 LAB — CBC WITH DIFFERENTIAL (CANCER CENTER ONLY)
Abs Immature Granulocytes: 0.07 K/uL (ref 0.00–0.07)
Basophils Absolute: 0 K/uL (ref 0.0–0.1)
Basophils Relative: 0 %
Eosinophils Absolute: 0 K/uL (ref 0.0–0.5)
Eosinophils Relative: 0 %
HCT: 40.1 % (ref 36.0–46.0)
Hemoglobin: 13.3 g/dL (ref 12.0–15.0)
Immature Granulocytes: 1 %
Lymphocytes Relative: 9 %
Lymphs Abs: 0.6 K/uL — ABNORMAL LOW (ref 0.7–4.0)
MCH: 32.4 pg (ref 26.0–34.0)
MCHC: 33.2 g/dL (ref 30.0–36.0)
MCV: 97.8 fL (ref 80.0–100.0)
Monocytes Absolute: 0.1 K/uL (ref 0.1–1.0)
Monocytes Relative: 1 %
Neutro Abs: 6.6 K/uL (ref 1.7–7.7)
Neutrophils Relative %: 89 %
Platelet Count: 178 K/uL (ref 150–400)
RBC: 4.1 MIL/uL (ref 3.87–5.11)
RDW: 12.5 % (ref 11.5–15.5)
WBC Count: 7.4 K/uL (ref 4.0–10.5)
nRBC: 0 % (ref 0.0–0.2)

## 2024-10-27 LAB — CMP (CANCER CENTER ONLY)
ALT: 22 U/L (ref 0–44)
AST: 23 U/L (ref 15–41)
Albumin: 4.4 g/dL (ref 3.5–5.0)
Alkaline Phosphatase: 78 U/L (ref 38–126)
Anion gap: 12 (ref 5–15)
BUN: 18 mg/dL (ref 8–23)
CO2: 27 mmol/L (ref 22–32)
Calcium: 9.1 mg/dL (ref 8.9–10.3)
Chloride: 102 mmol/L (ref 98–111)
Creatinine: 0.84 mg/dL (ref 0.44–1.00)
GFR, Estimated: >60 mL/min
Glucose, Bld: 150 mg/dL — ABNORMAL HIGH (ref 70–99)
Potassium: 4.3 mmol/L (ref 3.5–5.1)
Sodium: 141 mmol/L (ref 135–145)
Total Bilirubin: 1.3 mg/dL — ABNORMAL HIGH (ref 0.0–1.2)
Total Protein: 6.3 g/dL — ABNORMAL LOW (ref 6.5–8.1)

## 2024-10-27 MED ORDER — DEXTROSE 5 % IV SOLN
70.0000 mg/m2 | Freq: Once | INTRAVENOUS | Status: AC
  Administered 2024-10-27: 120 mg via INTRAVENOUS
  Filled 2024-10-27: qty 60

## 2024-10-27 MED ORDER — SODIUM CHLORIDE 0.9 % IV SOLN: Freq: Once | INTRAVENOUS | Status: AC

## 2024-10-27 NOTE — Patient Instructions (Signed)
 [facility] - A DEPT OF [facility]  Discharge Instructions: Thank you for choosing [facility] to provide your oncology and hematology care.   If you have a lab appointment with the Cancer Center, please go directly to the Cancer Center and check in at the registration area.   Wear comfortable clothing and clothing appropriate for easy access to any Portacath or PICC line.   We strive to give you quality time with your provider. You may need to reschedule your appointment if you arrive late (15 or more minutes).  Arriving late affects you and other patients whose appointments are after yours.  Also, if you miss three or more appointments without notifying the office, you may be dismissed from the clinic at the provider's discretion.      For prescription refill requests, have your pharmacy contact our office and allow 72 hours for refills to be completed.    Today you received the following chemotherapy and/or immunotherapy agents kyprolis      To help prevent nausea and vomiting after your treatment, we encourage you to take your nausea medication as directed.  BELOW ARE SYMPTOMS THAT SHOULD BE REPORTED IMMEDIATELY: *FEVER GREATER THAN 100.4 F (38 C) OR HIGHER *CHILLS OR SWEATING *NAUSEA AND VOMITING THAT IS NOT CONTROLLED WITH YOUR NAUSEA MEDICATION *UNUSUAL SHORTNESS OF BREATH *UNUSUAL BRUISING OR BLEEDING *URINARY PROBLEMS (pain or burning when urinating, or frequent urination) *BOWEL PROBLEMS (unusual diarrhea, constipation, pain near the anus) TENDERNESS IN MOUTH AND THROAT WITH OR WITHOUT PRESENCE OF ULCERS (sore throat, sores in mouth, or a toothache) UNUSUAL RASH, SWELLING OR PAIN  UNUSUAL VAGINAL DISCHARGE OR ITCHING   Items with * indicate a potential emergency and should be followed up as soon as possible or go to the Emergency Department if any problems should occur.  Please show the CHEMOTHERAPY ALERT CARD or IMMUNOTHERAPY  ALERT CARD at check-in to the Emergency Department and triage nurse.  Should you have questions after your visit or need to cancel or reschedule your appointment, please contact [facility] - A DEPT OF [facility]  Dept: [phone]  and follow the prompts.  Office hours are 8:00 a.m. to 4:30 p.m. Monday - Friday. Please note that voicemails left after 4:00 p.m. may not be returned until the following business day.  We are closed weekends and major holidays. You have access to a nurse at all times for urgent questions. Please call the main number to the clinic Dept: [phone] and follow the prompts.   For any non-urgent questions, you may also contact your provider using MyChart. We now offer e-Visits for anyone 80 and older to request care online for non-urgent symptoms. For details visit [email or web address].   Also download the MyChart app! Go to the app store, search "MyChart", open the app, select [facility], and log in with your MyChart username and password.

## 2024-10-28 ENCOUNTER — Other Ambulatory Visit: Payer: Self-pay

## 2024-10-28 ENCOUNTER — Other Ambulatory Visit: Payer: Self-pay | Admitting: Hematology and Oncology

## 2024-10-29 ENCOUNTER — Encounter: Payer: Self-pay | Admitting: Hematology and Oncology

## 2024-10-30 ENCOUNTER — Other Ambulatory Visit: Payer: Self-pay | Admitting: Hematology and Oncology

## 2024-10-30 ENCOUNTER — Other Ambulatory Visit: Payer: Self-pay

## 2024-10-30 DIAGNOSIS — C9000 Multiple myeloma not having achieved remission: Secondary | ICD-10-CM

## 2024-11-03 ENCOUNTER — Encounter: Payer: Self-pay | Admitting: Hematology and Oncology

## 2024-11-03 ENCOUNTER — Encounter: Payer: Self-pay | Admitting: Nurse Practitioner

## 2024-11-06 ENCOUNTER — Other Ambulatory Visit: Payer: Self-pay

## 2024-11-06 ENCOUNTER — Telehealth: Payer: Self-pay | Admitting: Hematology and Oncology

## 2024-11-06 ENCOUNTER — Telehealth: Payer: Self-pay | Admitting: *Deleted

## 2024-11-06 NOTE — Telephone Encounter (Signed)
 spoke to pt about Rescheduling upcoming appt

## 2024-11-06 NOTE — Telephone Encounter (Signed)
 TCT pt regarding Monday's treatment schedule due to impending winter weather event. Spoke with her. Advised that Dr. Federico is very comfortable pushing the next start of her treatment cycle out a week and re-adjusting the future appts. She would be back on 11/17/24 and that would be day 1. Pt is agreeable to this. Will advise Dr. Federico on this as well as scheduling.

## 2024-11-07 ENCOUNTER — Inpatient Hospital Stay

## 2024-11-08 ENCOUNTER — Other Ambulatory Visit: Payer: Self-pay

## 2024-11-10 ENCOUNTER — Inpatient Hospital Stay

## 2024-11-10 ENCOUNTER — Inpatient Hospital Stay: Admitting: Hematology and Oncology

## 2024-11-11 ENCOUNTER — Encounter: Payer: Self-pay | Admitting: Hematology and Oncology

## 2024-11-15 ENCOUNTER — Encounter: Payer: Self-pay | Admitting: Hematology and Oncology

## 2024-11-17 ENCOUNTER — Inpatient Hospital Stay: Payer: Self-pay

## 2024-11-17 ENCOUNTER — Inpatient Hospital Stay: Admitting: Hematology and Oncology

## 2024-11-18 ENCOUNTER — Inpatient Hospital Stay: Attending: Hematology and Oncology | Admitting: Hematology and Oncology

## 2024-11-18 ENCOUNTER — Inpatient Hospital Stay: Attending: Hematology and Oncology

## 2024-11-18 ENCOUNTER — Inpatient Hospital Stay: Admitting: Hematology and Oncology

## 2024-11-18 ENCOUNTER — Inpatient Hospital Stay

## 2024-11-18 VITALS — BP 132/52 | HR 87 | Temp 97.7°F | Resp 16 | Wt 145.4 lb

## 2024-11-18 DIAGNOSIS — C9000 Multiple myeloma not having achieved remission: Secondary | ICD-10-CM

## 2024-11-18 DIAGNOSIS — Z95828 Presence of other vascular implants and grafts: Secondary | ICD-10-CM

## 2024-11-18 LAB — CBC WITH DIFFERENTIAL (CANCER CENTER ONLY)
Abs Immature Granulocytes: 0.05 10*3/uL (ref 0.00–0.07)
Basophils Absolute: 0 10*3/uL (ref 0.0–0.1)
Basophils Relative: 0 %
Eosinophils Absolute: 0 10*3/uL (ref 0.0–0.5)
Eosinophils Relative: 0 %
HCT: 40.8 % (ref 36.0–46.0)
Hemoglobin: 13.8 g/dL (ref 12.0–15.0)
Immature Granulocytes: 1 %
Lymphocytes Relative: 5 %
Lymphs Abs: 0.5 10*3/uL — ABNORMAL LOW (ref 0.7–4.0)
MCH: 33.1 pg (ref 26.0–34.0)
MCHC: 33.8 g/dL (ref 30.0–36.0)
MCV: 97.8 fL (ref 80.0–100.0)
Monocytes Absolute: 0.1 10*3/uL (ref 0.1–1.0)
Monocytes Relative: 1 %
Neutro Abs: 8.6 10*3/uL — ABNORMAL HIGH (ref 1.7–7.7)
Neutrophils Relative %: 93 %
Platelet Count: 179 10*3/uL (ref 150–400)
RBC: 4.17 MIL/uL (ref 3.87–5.11)
RDW: 12.3 % (ref 11.5–15.5)
WBC Count: 9.2 10*3/uL (ref 4.0–10.5)
nRBC: 0 % (ref 0.0–0.2)

## 2024-11-18 LAB — CMP (CANCER CENTER ONLY)
ALT: 20 U/L (ref 0–44)
AST: 22 U/L (ref 15–41)
Albumin: 4.6 g/dL (ref 3.5–5.0)
Alkaline Phosphatase: 85 U/L (ref 38–126)
Anion gap: 13 (ref 5–15)
BUN: 12 mg/dL (ref 8–23)
CO2: 26 mmol/L (ref 22–32)
Calcium: 9.3 mg/dL (ref 8.9–10.3)
Chloride: 105 mmol/L (ref 98–111)
Creatinine: 0.74 mg/dL (ref 0.44–1.00)
GFR, Estimated: >60 mL/min
Glucose, Bld: 191 mg/dL — ABNORMAL HIGH (ref 70–99)
Potassium: 4 mmol/L (ref 3.5–5.1)
Sodium: 143 mmol/L (ref 135–145)
Total Bilirubin: 1.4 mg/dL — ABNORMAL HIGH (ref 0.0–1.2)
Total Protein: 6.3 g/dL — ABNORMAL LOW (ref 6.5–8.1)

## 2024-11-18 MED ORDER — DEXTROSE 5 % IV SOLN
70.0000 mg/m2 | Freq: Once | INTRAVENOUS | Status: AC
  Administered 2024-11-18: 120 mg via INTRAVENOUS
  Filled 2024-11-18: qty 60

## 2024-11-18 MED ORDER — ZOLEDRONIC ACID 4 MG/100ML IV SOLN
4.0000 mg | Freq: Once | INTRAVENOUS | Status: AC
  Administered 2024-11-18: 4 mg via INTRAVENOUS
  Filled 2024-11-18: qty 100

## 2024-11-18 MED ORDER — SODIUM CHLORIDE 0.9 % IV SOLN: INTRAVENOUS | Status: DC

## 2024-11-18 MED ORDER — SODIUM CHLORIDE 0.9 % IV SOLN: Freq: Once | INTRAVENOUS | Status: AC

## 2024-11-18 NOTE — Progress Notes (Signed)
Pt. states she took Decadron 20 mg po this am at home.

## 2024-11-19 LAB — KAPPA/LAMBDA LIGHT CHAINS
Kappa free light chain: 1 mg/L — ABNORMAL LOW (ref 3.3–19.4)
Kappa, lambda light chain ratio: 0.03 — ABNORMAL LOW (ref 0.26–1.65)
Lambda free light chains: 39.3 mg/L — ABNORMAL HIGH (ref 5.7–26.3)

## 2024-11-21 LAB — MULTIPLE MYELOMA PANEL, SERUM
Albumin SerPl Elph-Mcnc: 4 g/dL (ref 2.9–4.4)
Albumin/Glob SerPl: 2.1 — ABNORMAL HIGH (ref 0.7–1.7)
Alpha 1: 0.3 g/dL (ref 0.0–0.4)
Alpha2 Glob SerPl Elph-Mcnc: 0.7 g/dL (ref 0.4–1.0)
B-Globulin SerPl Elph-Mcnc: 0.9 g/dL (ref 0.7–1.3)
Gamma Glob SerPl Elph-Mcnc: 0.1 g/dL — ABNORMAL LOW (ref 0.4–1.8)
Globulin, Total: 2 g/dL — ABNORMAL LOW (ref 2.2–3.9)
IgA: <5 mg/dL — ABNORMAL LOW (ref 64–422)
IgG (Immunoglobin G), Serum: 127 mg/dL — ABNORMAL LOW (ref 586–1602)
IgM (Immunoglobulin M), Srm: <5 mg/dL — ABNORMAL LOW (ref 26–217)
Total Protein ELP: 6 g/dL (ref 6.0–8.5)

## 2024-11-24 ENCOUNTER — Inpatient Hospital Stay: Payer: Self-pay

## 2024-11-24 ENCOUNTER — Inpatient Hospital Stay: Payer: Self-pay | Admitting: Hematology and Oncology

## 2024-12-01 ENCOUNTER — Inpatient Hospital Stay

## 2024-12-01 ENCOUNTER — Inpatient Hospital Stay: Admitting: Nurse Practitioner

## 2024-12-02 ENCOUNTER — Inpatient Hospital Stay

## 2024-12-02 ENCOUNTER — Inpatient Hospital Stay: Admitting: Nurse Practitioner

## 2024-12-08 ENCOUNTER — Inpatient Hospital Stay: Payer: Self-pay | Admitting: Hematology and Oncology

## 2024-12-08 ENCOUNTER — Inpatient Hospital Stay: Payer: Self-pay

## 2024-12-15 ENCOUNTER — Inpatient Hospital Stay

## 2024-12-15 ENCOUNTER — Inpatient Hospital Stay: Payer: Self-pay

## 2024-12-15 ENCOUNTER — Inpatient Hospital Stay: Admitting: Hematology and Oncology

## 2024-12-15 ENCOUNTER — Inpatient Hospital Stay: Payer: Self-pay | Attending: Hematology and Oncology

## 2024-12-15 ENCOUNTER — Inpatient Hospital Stay: Attending: Hematology and Oncology

## 2024-12-22 ENCOUNTER — Inpatient Hospital Stay

## 2024-12-22 ENCOUNTER — Inpatient Hospital Stay: Admitting: Hematology and Oncology

## 2024-12-29 ENCOUNTER — Inpatient Hospital Stay

## 2024-12-29 ENCOUNTER — Inpatient Hospital Stay: Admitting: Hematology and Oncology

## 2025-01-05 ENCOUNTER — Inpatient Hospital Stay: Payer: Self-pay | Admitting: Hematology and Oncology

## 2025-01-05 ENCOUNTER — Inpatient Hospital Stay: Payer: Self-pay
